# Patient Record
Sex: Male | Born: 1935
Health system: Southern US, Community
[De-identification: ages and names within clinical notes are randomized; demographics above are authoritative.]

## PROBLEM LIST (undated history)

## (undated) DIAGNOSIS — M1990 Unspecified osteoarthritis, unspecified site: Secondary | ICD-10-CM

## (undated) DIAGNOSIS — G609 Hereditary and idiopathic neuropathy, unspecified: Secondary | ICD-10-CM

## (undated) DIAGNOSIS — Z950 Presence of cardiac pacemaker: Secondary | ICD-10-CM

## (undated) DIAGNOSIS — K602 Anal fissure, unspecified: Secondary | ICD-10-CM

## (undated) DIAGNOSIS — D126 Benign neoplasm of colon, unspecified: Secondary | ICD-10-CM

## (undated) DIAGNOSIS — M549 Dorsalgia, unspecified: Secondary | ICD-10-CM

## (undated) DIAGNOSIS — K219 Gastro-esophageal reflux disease without esophagitis: Secondary | ICD-10-CM

## (undated) DIAGNOSIS — E663 Overweight: Secondary | ICD-10-CM

## (undated) DIAGNOSIS — G4730 Sleep apnea, unspecified: Secondary | ICD-10-CM

## (undated) DIAGNOSIS — D682 Hereditary deficiency of other clotting factors: Secondary | ICD-10-CM

## (undated) DIAGNOSIS — N138 Other obstructive and reflux uropathy: Secondary | ICD-10-CM

## (undated) DIAGNOSIS — K5730 Diverticulosis of large intestine without perforation or abscess without bleeding: Secondary | ICD-10-CM

## (undated) DIAGNOSIS — G4733 Obstructive sleep apnea (adult) (pediatric): Secondary | ICD-10-CM

## (undated) DIAGNOSIS — N400 Enlarged prostate without lower urinary tract symptoms: Secondary | ICD-10-CM

## (undated) DIAGNOSIS — K2270 Barrett's esophagus without dysplasia: Secondary | ICD-10-CM

## (undated) DIAGNOSIS — I82409 Acute embolism and thrombosis of unspecified deep veins of unspecified lower extremity: Secondary | ICD-10-CM

## (undated) DIAGNOSIS — N401 Enlarged prostate with lower urinary tract symptoms: Secondary | ICD-10-CM

## (undated) DIAGNOSIS — I10 Essential (primary) hypertension: Secondary | ICD-10-CM

## (undated) DIAGNOSIS — D649 Anemia, unspecified: Secondary | ICD-10-CM

## (undated) DIAGNOSIS — R55 Syncope and collapse: Secondary | ICD-10-CM

## (undated) DIAGNOSIS — R7309 Other abnormal glucose: Secondary | ICD-10-CM

## (undated) DIAGNOSIS — K589 Irritable bowel syndrome without diarrhea: Secondary | ICD-10-CM

## (undated) DIAGNOSIS — T7840XA Allergy, unspecified, initial encounter: Secondary | ICD-10-CM

## (undated) HISTORY — DX: Overweight: E66.3

## (undated) HISTORY — DX: Anemia, unspecified: D64.9

## (undated) HISTORY — PX: CATARACT EXTRACTION: SUR2

## (undated) HISTORY — DX: Essential (primary) hypertension: I10

## (undated) HISTORY — DX: Unspecified osteoarthritis, unspecified site: M19.90

## (undated) HISTORY — DX: Obstructive sleep apnea (adult) (pediatric): G47.33

## (undated) HISTORY — DX: Hereditary and idiopathic neuropathy, unspecified: G60.9

## (undated) HISTORY — PX: PACEMAKER PLACEMENT: SHX43

## (undated) HISTORY — DX: Dorsalgia, unspecified: M54.9

## (undated) HISTORY — DX: Other obstructive and reflux uropathy: N13.8

## (undated) HISTORY — DX: Benign prostatic hyperplasia without lower urinary tract symptoms: N40.0

## (undated) HISTORY — DX: Other abnormal glucose: R73.09

## (undated) HISTORY — DX: Gastro-esophageal reflux disease without esophagitis: K21.9

## (undated) HISTORY — PX: CHOLECYSTECTOMY: SHX55

## (undated) HISTORY — DX: Allergy, unspecified, initial encounter: T78.40XA

## (undated) HISTORY — DX: Anal fissure, unspecified: K60.2

## (undated) HISTORY — DX: Syncope and collapse: R55

## (undated) HISTORY — DX: Benign prostatic hyperplasia with lower urinary tract symptoms: N40.1

## (undated) HISTORY — DX: Benign neoplasm of colon, unspecified: D12.6

## (undated) HISTORY — DX: Hereditary deficiency of other clotting factors: D68.2

## (undated) HISTORY — DX: Acute embolism and thrombosis of unspecified deep veins of unspecified lower extremity: I82.409

## (undated) HISTORY — PX: APPENDECTOMY: SHX54

## (undated) HISTORY — DX: Barrett's esophagus without dysplasia: K22.70

## (undated) HISTORY — PX: TONSILLECTOMY: SUR1361

## (undated) HISTORY — DX: Diverticulosis of large intestine without perforation or abscess without bleeding: K57.30

## (undated) HISTORY — DX: Irritable bowel syndrome, unspecified: K58.9

## (undated) HISTORY — DX: Presence of cardiac pacemaker: Z95.0

## (undated) HISTORY — DX: Sleep apnea, unspecified: G47.30

---

## 1999-08-23 ENCOUNTER — Encounter (INDEPENDENT_AMBULATORY_CARE_PROVIDER_SITE_OTHER): Payer: Self-pay | Admitting: Specialist

## 1999-08-23 ENCOUNTER — Other Ambulatory Visit: Admission: RE | Admit: 1999-08-23 | Discharge: 1999-08-23 | Payer: Self-pay | Admitting: Gastroenterology

## 2004-08-29 ENCOUNTER — Ambulatory Visit: Payer: Self-pay

## 2004-09-03 ENCOUNTER — Ambulatory Visit: Payer: Self-pay | Admitting: Pulmonary Disease

## 2004-09-13 ENCOUNTER — Ambulatory Visit: Payer: Self-pay | Admitting: Pulmonary Disease

## 2004-10-18 ENCOUNTER — Ambulatory Visit: Payer: Self-pay | Admitting: Internal Medicine

## 2005-01-04 ENCOUNTER — Ambulatory Visit: Payer: Self-pay | Admitting: Internal Medicine

## 2005-03-05 ENCOUNTER — Ambulatory Visit: Payer: Self-pay | Admitting: Pulmonary Disease

## 2005-03-22 ENCOUNTER — Ambulatory Visit: Payer: Self-pay | Admitting: Internal Medicine

## 2005-05-23 ENCOUNTER — Ambulatory Visit: Payer: Self-pay | Admitting: Internal Medicine

## 2005-06-05 ENCOUNTER — Ambulatory Visit: Payer: Self-pay | Admitting: Pulmonary Disease

## 2005-07-25 ENCOUNTER — Ambulatory Visit: Payer: Self-pay | Admitting: Internal Medicine

## 2005-09-11 ENCOUNTER — Ambulatory Visit: Payer: Self-pay | Admitting: Internal Medicine

## 2005-11-07 ENCOUNTER — Ambulatory Visit: Payer: Self-pay | Admitting: Pulmonary Disease

## 2005-11-11 ENCOUNTER — Ambulatory Visit: Payer: Self-pay | Admitting: Internal Medicine

## 2006-01-10 ENCOUNTER — Ambulatory Visit: Payer: Self-pay | Admitting: Internal Medicine

## 2006-01-14 ENCOUNTER — Ambulatory Visit: Payer: Self-pay | Admitting: Pulmonary Disease

## 2006-05-06 ENCOUNTER — Ambulatory Visit: Payer: Self-pay | Admitting: Internal Medicine

## 2006-08-04 ENCOUNTER — Encounter: Admission: RE | Admit: 2006-08-04 | Discharge: 2006-08-04 | Payer: Self-pay | Admitting: Orthopedic Surgery

## 2006-08-13 ENCOUNTER — Ambulatory Visit: Payer: Self-pay | Admitting: Internal Medicine

## 2006-11-10 ENCOUNTER — Ambulatory Visit: Payer: Self-pay | Admitting: Pulmonary Disease

## 2006-11-12 ENCOUNTER — Ambulatory Visit: Payer: Self-pay | Admitting: Internal Medicine

## 2006-11-13 ENCOUNTER — Encounter: Payer: Self-pay | Admitting: Pulmonary Disease

## 2007-02-12 ENCOUNTER — Ambulatory Visit: Payer: Self-pay | Admitting: Internal Medicine

## 2007-05-07 ENCOUNTER — Ambulatory Visit: Payer: Self-pay | Admitting: Internal Medicine

## 2007-05-18 ENCOUNTER — Ambulatory Visit: Payer: Self-pay | Admitting: Internal Medicine

## 2007-06-15 ENCOUNTER — Ambulatory Visit: Payer: Self-pay | Admitting: Internal Medicine

## 2007-06-15 LAB — CONVERTED CEMR LAB
BUN: 10 mg/dL (ref 6–23)
CO2: 27 meq/L (ref 19–32)
Calcium: 9 mg/dL (ref 8.4–10.5)
Chloride: 99 meq/L (ref 96–112)
Creatinine, Ser: 1 mg/dL (ref 0.4–1.5)
Eosinophils Relative: 9.3 % — ABNORMAL HIGH (ref 0.0–5.0)
GFR calc Af Amer: 95 mL/min
Glucose, Bld: 104 mg/dL — ABNORMAL HIGH (ref 70–99)
HCT: 39 % (ref 39.0–52.0)
Hemoglobin: 13 g/dL (ref 13.0–17.0)
INR: 1.1 — ABNORMAL HIGH (ref 0.8–1.0)
Lymphocytes Relative: 26 % (ref 12.0–46.0)
Monocytes Absolute: 0.7 10*3/uL (ref 0.2–0.7)
Neutrophils Relative %: 53.4 % (ref 43.0–77.0)
RDW: 14.9 % — ABNORMAL HIGH (ref 11.5–14.6)
Sodium: 135 meq/L (ref 135–145)
WBC: 6.6 10*3/uL (ref 4.5–10.5)

## 2007-06-19 ENCOUNTER — Ambulatory Visit (HOSPITAL_COMMUNITY): Admission: RE | Admit: 2007-06-19 | Discharge: 2007-06-19 | Payer: Self-pay | Admitting: Internal Medicine

## 2007-06-19 ENCOUNTER — Ambulatory Visit: Payer: Self-pay | Admitting: Internal Medicine

## 2007-07-08 ENCOUNTER — Ambulatory Visit: Payer: Self-pay

## 2007-09-04 ENCOUNTER — Ambulatory Visit: Payer: Self-pay | Admitting: Internal Medicine

## 2007-10-30 DIAGNOSIS — M1990 Unspecified osteoarthritis, unspecified site: Secondary | ICD-10-CM | POA: Insufficient documentation

## 2007-10-30 DIAGNOSIS — G609 Hereditary and idiopathic neuropathy, unspecified: Secondary | ICD-10-CM | POA: Insufficient documentation

## 2007-10-30 DIAGNOSIS — K589 Irritable bowel syndrome without diarrhea: Secondary | ICD-10-CM | POA: Insufficient documentation

## 2007-11-02 ENCOUNTER — Ambulatory Visit: Payer: Self-pay | Admitting: Pulmonary Disease

## 2007-11-02 DIAGNOSIS — K5730 Diverticulosis of large intestine without perforation or abscess without bleeding: Secondary | ICD-10-CM | POA: Insufficient documentation

## 2007-11-02 DIAGNOSIS — J309 Allergic rhinitis, unspecified: Secondary | ICD-10-CM | POA: Insufficient documentation

## 2007-11-02 DIAGNOSIS — E663 Overweight: Secondary | ICD-10-CM | POA: Insufficient documentation

## 2007-11-02 DIAGNOSIS — N401 Enlarged prostate with lower urinary tract symptoms: Secondary | ICD-10-CM

## 2007-11-02 DIAGNOSIS — I10 Essential (primary) hypertension: Secondary | ICD-10-CM | POA: Insufficient documentation

## 2007-11-02 DIAGNOSIS — N138 Other obstructive and reflux uropathy: Secondary | ICD-10-CM | POA: Insufficient documentation

## 2007-11-02 DIAGNOSIS — R55 Syncope and collapse: Secondary | ICD-10-CM | POA: Insufficient documentation

## 2007-11-02 DIAGNOSIS — K219 Gastro-esophageal reflux disease without esophagitis: Secondary | ICD-10-CM | POA: Insufficient documentation

## 2007-12-08 ENCOUNTER — Telehealth (INDEPENDENT_AMBULATORY_CARE_PROVIDER_SITE_OTHER): Payer: Self-pay | Admitting: *Deleted

## 2008-05-31 ENCOUNTER — Ambulatory Visit: Payer: Self-pay | Admitting: Internal Medicine

## 2008-08-15 ENCOUNTER — Emergency Department (HOSPITAL_COMMUNITY): Admission: EM | Admit: 2008-08-15 | Discharge: 2008-08-16 | Payer: Self-pay | Admitting: Emergency Medicine

## 2008-11-03 ENCOUNTER — Ambulatory Visit: Payer: Self-pay | Admitting: Pulmonary Disease

## 2008-11-03 DIAGNOSIS — M549 Dorsalgia, unspecified: Secondary | ICD-10-CM | POA: Insufficient documentation

## 2008-11-04 ENCOUNTER — Encounter (INDEPENDENT_AMBULATORY_CARE_PROVIDER_SITE_OTHER): Payer: Self-pay

## 2008-11-21 ENCOUNTER — Telehealth (INDEPENDENT_AMBULATORY_CARE_PROVIDER_SITE_OTHER): Payer: Self-pay | Admitting: *Deleted

## 2008-11-21 DIAGNOSIS — G4733 Obstructive sleep apnea (adult) (pediatric): Secondary | ICD-10-CM | POA: Insufficient documentation

## 2008-11-22 ENCOUNTER — Ambulatory Visit: Payer: Self-pay | Admitting: Pulmonary Disease

## 2008-11-24 ENCOUNTER — Encounter: Payer: Self-pay | Admitting: Internal Medicine

## 2008-11-27 ENCOUNTER — Encounter: Payer: Self-pay | Admitting: Internal Medicine

## 2008-11-28 ENCOUNTER — Ambulatory Visit: Payer: Self-pay | Admitting: Internal Medicine

## 2008-12-06 ENCOUNTER — Encounter: Payer: Self-pay | Admitting: Internal Medicine

## 2008-12-06 ENCOUNTER — Ambulatory Visit: Payer: Self-pay | Admitting: Pulmonary Disease

## 2008-12-06 LAB — CONVERTED CEMR LAB
Fecal Occult Blood: NEGATIVE
OCCULT 1: NEGATIVE
OCCULT 2: NEGATIVE

## 2008-12-12 ENCOUNTER — Telehealth (INDEPENDENT_AMBULATORY_CARE_PROVIDER_SITE_OTHER): Payer: Self-pay | Admitting: *Deleted

## 2008-12-14 LAB — CONVERTED CEMR LAB
ALT: 22 units/L (ref 0–53)
AST: 26 units/L (ref 0–37)
Albumin: 3.8 g/dL (ref 3.5–5.2)
BUN: 14 mg/dL (ref 6–23)
Chloride: 101 meq/L (ref 96–112)
Cholesterol: 172 mg/dL (ref 0–200)
Eosinophils Relative: 15 % — ABNORMAL HIGH (ref 0.0–5.0)
GFR calc non Af Amer: 87.93 mL/min (ref >60)
Glucose, Bld: 113 mg/dL — ABNORMAL HIGH (ref 70–99)
Hgb A1c MFr Bld: 6.3 % (ref 4.6–6.5)
Leukocytes, UA: NEGATIVE
Lymphocytes Relative: 30.3 % (ref 12.0–46.0)
Monocytes Relative: 13.9 % — ABNORMAL HIGH (ref 3.0–12.0)
Neutrophils Relative %: 40.4 % — ABNORMAL LOW (ref 43.0–77.0)
Platelets: 252 10*3/uL (ref 150.0–400.0)
Potassium: 3.9 meq/L (ref 3.5–5.1)
Specific Gravity, Urine: 1.01 (ref 1.000–1.030)
TSH: 1.96 microintl units/mL (ref 0.35–5.50)
Total Protein: 7.3 g/dL (ref 6.0–8.3)
Urobilinogen, UA: 0.2 (ref 0.0–1.0)
VLDL: 18.8 mg/dL (ref 0.0–40.0)
WBC: 6.8 10*3/uL (ref 4.5–10.5)

## 2009-01-02 ENCOUNTER — Encounter: Payer: Self-pay | Admitting: Pulmonary Disease

## 2009-03-01 ENCOUNTER — Ambulatory Visit: Payer: Self-pay | Admitting: Internal Medicine

## 2009-03-13 ENCOUNTER — Encounter: Payer: Self-pay | Admitting: Internal Medicine

## 2009-06-01 ENCOUNTER — Encounter: Payer: Self-pay | Admitting: Pulmonary Disease

## 2009-06-27 ENCOUNTER — Encounter: Payer: Self-pay | Admitting: Pulmonary Disease

## 2009-07-04 ENCOUNTER — Ambulatory Visit: Payer: Self-pay | Admitting: Internal Medicine

## 2009-08-24 ENCOUNTER — Telehealth: Payer: Self-pay | Admitting: Pulmonary Disease

## 2009-09-21 ENCOUNTER — Telehealth: Payer: Self-pay | Admitting: Pulmonary Disease

## 2009-10-03 ENCOUNTER — Encounter: Payer: Self-pay | Admitting: Internal Medicine

## 2009-10-04 ENCOUNTER — Ambulatory Visit: Payer: Self-pay | Admitting: Internal Medicine

## 2009-10-10 ENCOUNTER — Encounter: Payer: Self-pay | Admitting: Internal Medicine

## 2009-11-02 ENCOUNTER — Ambulatory Visit: Payer: Self-pay | Admitting: Pulmonary Disease

## 2009-11-02 DIAGNOSIS — R7301 Impaired fasting glucose: Secondary | ICD-10-CM | POA: Insufficient documentation

## 2009-11-02 LAB — CONVERTED CEMR LAB
Albumin: 4.2 g/dL (ref 3.5–5.2)
Alkaline Phosphatase: 56 units/L (ref 39–117)
Basophils Relative: 0.9 % (ref 0.0–3.0)
Calcium: 9.1 mg/dL (ref 8.4–10.5)
Creatinine, Ser: 0.8 mg/dL (ref 0.4–1.5)
Eosinophils Relative: 9.5 % — ABNORMAL HIGH (ref 0.0–5.0)
GFR calc non Af Amer: 100.46 mL/min (ref >60)
Glucose, Bld: 109 mg/dL — ABNORMAL HIGH (ref 70–99)
HCT: 41.6 % (ref 39.0–52.0)
HDL: 50.3 mg/dL (ref >39.00)
Hemoglobin: 14.1 g/dL (ref 13.0–17.0)
Lymphocytes Relative: 34.2 % (ref 12.0–46.0)
Lymphs Abs: 2.3 10*3/uL (ref 0.7–4.0)
Monocytes Relative: 12.7 % — ABNORMAL HIGH (ref 3.0–12.0)
Neutro Abs: 2.8 10*3/uL (ref 1.4–7.7)
PSA: 2.26 ng/mL (ref 0.10–4.00)
RBC: 4.53 M/uL (ref 4.22–5.81)
RDW: 16 % — ABNORMAL HIGH (ref 11.5–14.6)
Sodium: 137 meq/L (ref 135–145)
Total CHOL/HDL Ratio: 4
Triglycerides: 106 mg/dL (ref 0.0–149.0)
VLDL: 21.2 mg/dL (ref 0.0–40.0)
WBC: 6.6 10*3/uL (ref 4.5–10.5)

## 2009-11-06 LAB — CONVERTED CEMR LAB: Vit D, 25-Hydroxy: 30 ng/mL (ref 30–89)

## 2009-11-10 ENCOUNTER — Telehealth (INDEPENDENT_AMBULATORY_CARE_PROVIDER_SITE_OTHER): Payer: Self-pay | Admitting: *Deleted

## 2009-11-13 ENCOUNTER — Ambulatory Visit: Payer: Self-pay | Admitting: Pulmonary Disease

## 2009-11-13 LAB — CONVERTED CEMR LAB
OCCULT 1: NEGATIVE
OCCULT 2: NEGATIVE
OCCULT 3: NEGATIVE
OCCULT 4: NEGATIVE

## 2009-11-22 ENCOUNTER — Encounter: Payer: Self-pay | Admitting: Pulmonary Disease

## 2009-11-23 ENCOUNTER — Ambulatory Visit: Payer: Self-pay | Admitting: Pulmonary Disease

## 2009-12-07 ENCOUNTER — Encounter: Payer: Self-pay | Admitting: Pulmonary Disease

## 2009-12-11 ENCOUNTER — Encounter: Payer: Self-pay | Admitting: Pulmonary Disease

## 2010-01-03 ENCOUNTER — Ambulatory Visit: Payer: Self-pay | Admitting: Internal Medicine

## 2010-02-08 ENCOUNTER — Encounter: Payer: Self-pay | Admitting: Internal Medicine

## 2010-03-16 ENCOUNTER — Encounter: Payer: Self-pay | Admitting: Cardiovascular Disease

## 2010-03-16 ENCOUNTER — Ambulatory Visit: Payer: Self-pay | Admitting: Cardiovascular Disease

## 2010-03-22 ENCOUNTER — Encounter: Payer: Self-pay | Admitting: Adult Health

## 2010-03-22 ENCOUNTER — Ambulatory Visit: Payer: Self-pay | Admitting: Pulmonary Disease

## 2010-03-22 ENCOUNTER — Telehealth: Payer: Self-pay | Admitting: Cardiovascular Disease

## 2010-03-22 DIAGNOSIS — R42 Dizziness and giddiness: Secondary | ICD-10-CM | POA: Insufficient documentation

## 2010-03-23 LAB — CONVERTED CEMR LAB
Albumin: 4.3 g/dL (ref 3.5–5.2)
Alkaline Phosphatase: 55 units/L (ref 39–117)
BUN: 13 mg/dL (ref 6–23)
Basophils Absolute: 0 10*3/uL (ref 0.0–0.1)
Bilirubin, Direct: 0.1 mg/dL (ref 0.0–0.3)
CO2: 29 meq/L (ref 19–32)
Calcium: 9.1 mg/dL (ref 8.4–10.5)
Eosinophils Absolute: 0.5 10*3/uL (ref 0.0–0.7)
GFR calc non Af Amer: 85.41 mL/min (ref >60)
Glucose, Bld: 97 mg/dL (ref 70–99)
Hemoglobin: 13.4 g/dL (ref 13.0–17.0)
Hgb A1c MFr Bld: 6.3 % (ref 4.6–6.5)
Leukocytes, UA: NEGATIVE
Lymphocytes Relative: 26.6 % (ref 12.0–46.0)
Lymphs Abs: 2.1 10*3/uL (ref 0.7–4.0)
MCHC: 35 g/dL (ref 30.0–36.0)
Monocytes Relative: 10.5 % (ref 3.0–12.0)
Neutro Abs: 4.4 10*3/uL (ref 1.4–7.7)
Platelets: 276 10*3/uL (ref 150.0–400.0)
RDW: 15.2 % — ABNORMAL HIGH (ref 11.5–14.6)
Specific Gravity, Urine: <=1.005 (ref 1.000–1.030)
TSH: 0.92 microintl units/mL (ref 0.35–5.50)
Total Protein: 7.1 g/dL (ref 6.0–8.3)
Urobilinogen, UA: 0.2 (ref 0.0–1.0)
pH: 7 (ref 5.0–8.0)

## 2010-04-12 ENCOUNTER — Ambulatory Visit: Payer: Self-pay | Admitting: Internal Medicine

## 2010-04-24 ENCOUNTER — Telehealth (INDEPENDENT_AMBULATORY_CARE_PROVIDER_SITE_OTHER): Payer: Self-pay | Admitting: *Deleted

## 2010-05-09 ENCOUNTER — Encounter: Payer: Self-pay | Admitting: Internal Medicine

## 2010-05-22 ENCOUNTER — Encounter: Payer: Self-pay | Admitting: Pulmonary Disease

## 2010-05-28 ENCOUNTER — Encounter: Payer: Self-pay | Admitting: Pulmonary Disease

## 2010-07-10 ENCOUNTER — Ambulatory Visit: Payer: Self-pay | Admitting: Internal Medicine

## 2010-08-26 LAB — CONVERTED CEMR LAB
ALT: 23 units/L (ref 0–40)
AST: 27 units/L (ref 0–37)
Albumin: 3.5 g/dL (ref 3.5–5.2)
Alkaline Phosphatase: 55 units/L (ref 39–117)
BUN: 6 mg/dL (ref 6–23)
Basophils Absolute: 0.1 10*3/uL (ref 0.0–0.1)
Basophils Relative: 1.1 % — ABNORMAL HIGH (ref 0.0–1.0)
Bilirubin Urine: NEGATIVE
Bilirubin, Direct: 0.1 mg/dL (ref 0.0–0.3)
CO2: 30 meq/L (ref 19–32)
Calcium: 8.6 mg/dL (ref 8.4–10.5)
Chloride: 105 meq/L (ref 96–112)
Cholesterol: 144 mg/dL (ref 0–200)
Creatinine, Ser: 0.9 mg/dL (ref 0.4–1.5)
Eosinophils Absolute: 0.7 10*3/uL — ABNORMAL HIGH (ref 0.0–0.6)
Eosinophils Relative: 12.8 % — ABNORMAL HIGH (ref 0.0–5.0)
GFR calc Af Amer: 107 mL/min
GFR calc non Af Amer: 89 mL/min
Glucose, Bld: 124 mg/dL — ABNORMAL HIGH (ref 70–99)
HCT: 42.1 % (ref 39.0–52.0)
HDL: 43.9 mg/dL (ref >39.0)
Hemoglobin, Urine: NEGATIVE
Hemoglobin: 14.1 g/dL (ref 13.0–17.0)
Ketones, ur: NEGATIVE mg/dL
LDL Cholesterol: 89 mg/dL (ref 0–99)
Leukocytes, UA: NEGATIVE
Lymphocytes Relative: 33.5 % (ref 12.0–46.0)
MCHC: 33.6 g/dL (ref 30.0–36.0)
MCV: 91.1 fL (ref 78.0–100.0)
Monocytes Absolute: 0.6 10*3/uL (ref 0.2–0.7)
Monocytes Relative: 11.4 % — ABNORMAL HIGH (ref 3.0–11.0)
Neutro Abs: 2.2 10*3/uL (ref 1.4–7.7)
Neutrophils Relative %: 41.2 % — ABNORMAL LOW (ref 43.0–77.0)
Nitrite: NEGATIVE
PSA: 1.93 ng/mL (ref 0.10–4.00)
Platelets: 237 10*3/uL (ref 150–400)
Potassium: 3.8 meq/L (ref 3.5–5.1)
RBC: 4.62 M/uL (ref 4.22–5.81)
RDW: 13.6 % (ref 11.5–14.6)
Sodium: 140 meq/L (ref 135–145)
Specific Gravity, Urine: 1.02 (ref 1.000–1.03)
TSH: 1.03 microintl units/mL (ref 0.35–5.50)
Total Bilirubin: 0.6 mg/dL (ref 0.3–1.2)
Total CHOL/HDL Ratio: 3.3
Total Protein, Urine: NEGATIVE mg/dL
Total Protein: 6.7 g/dL (ref 6.0–8.3)
Triglycerides: 54 mg/dL (ref 0–149)
Urine Glucose: NEGATIVE mg/dL
Urobilinogen, UA: 0.2 (ref 0.0–1.0)
VLDL: 11 mg/dL (ref 0–40)
WBC: 5.4 10*3/uL (ref 4.5–10.5)
pH: 6 (ref 5.0–8.0)

## 2010-08-30 NOTE — Progress Notes (Signed)
Summary: c pap equipment  Phone Note Call from Patient   Caller: Patient Call For: Jais Demir Summary of Call: pt need to talk to dr Drayden Lukas about c pap equipment Initial call taken by: Rickard Patience,  August 24, 2009 10:41 AM  Follow-up for Phone Call        The patient says he is needing a new mask for his CPAP. [facility]. He has never seen any of our sleep doctors. Please advise.Michel Bickers [facility]  August 24, 2009 10:48 AM   order has been sent in for a new mask for cpap machine---pt has upcoming appt in april with SN Randell Loop CMA  August 25, 2009 11:25 AM      Appended Document: c pap equipment Requested sleep study and current cpap settings from [place]. Spoke with Ethelle Lyon with [facility] and she will provide pt with cpap mask and supplies while we are waiting for sleep study. Contacted pt and he is aware that [facility] will be contacting him to get him a new cpap mask and supplies.

## 2010-08-30 NOTE — Assessment & Plan Note (Signed)
Summary: consult for osa   Copy to:  Alroy Dust Primary Provider/Referring Provider:  Alroy Dust  CC:  Sleep Consult.  History of Present Illness: The pt is a 77y/o male who I have been asked to see for management of osa.  The pt was diagnosed with osa in 2008 at the [facility], with AHI of 9 vs 21/hr (hard to ascertain from sleep report).  He was titrated to a cpap pressure of 11cm, and he was also noted to have frequent limb movements.  He has been on cpap since that time, and did feel more rested, but did not see a major difference in how he felt overall.  He has been compliant with the cpap, but has not been able to lose weight.  He denies breakthru snoring or apneas.  He goes to bed at 11-12mn, and arises at 7-9am to start his day.  He does feel rested upon arising.  He uses a full face mask, and has kept up with mask changes.  His epworth score today is 6, and he denies any significant daytime sleepiness with periods of inactivity.  Current Medications (verified): 1)  Loratadine 10 Mg  Tabs (Loratadine) .... Take 1 Tablet By Mouth Once A Day 2)  Bayer Aspirin Ec Low Dose 81 Mg  Tbec (Aspirin) .... Take 1 Tablet By Mouth Once A Day 3)  Hydrochlorothiazide 25 Mg  Tabs (Hydrochlorothiazide) .... Take 1 Tablet By Mouth Once A Day] 4)  Fish Oil 500 Mg  Caps (Omega-3 Fatty Acids) .... Take 1 Tablet By Mouth Once A Day 5)  Omeprazole 20 Mg  Tbec (Omeprazole) .... Take 1 Tablet By Mouth Twice Daily 6)  Flomax 0.4 Mg  Cp24 (Tamsulosin Hcl) .... Take 1 Tablet By Mouth Once A Day 7)  Saw Palmetto 160 Mg  Caps (Saw Palmetto (Serenoa Repens)) .... Take 1 Tablet By Mouth Once A Day 8)  Calcium-Carb 600 + D 600-125 Mg-Unit  Tabs (Calcium-Vitamin D) .... Take 1 Tablet By Mouth Once A Day 9)  Cvs Lecithin 1200 Mg Caps (Lecithin) .... Take 1 Tablet By Mouth Once A Day 10)  Vitamin D3 1000 Unit Tabs (Cholecalciferol) .... Take 1 Tablet By Mouth Once A Day 11)  Mobic 7.5 Mg Tabs (Meloxicam) .... Take 1  Tab By Mouth Once Daily As Needed For Arthritis Pain... 12)  Prednisone ?mg .... Take As Directed  Allergies (verified): 1)  ! * Shellfish  Past History:  Past Medical History: Reviewed history from 11/02/2009 and no changes required.  ALLERGIC RHINITIS (ICD-477.9) OBSTRUCTIVE SLEEP APNEA (ICD-327.23) HYPERTENSION (ICD-401.9) Hx of SYNCOPE (ICD-780.2) CARDIAC PACEMAKER-MEDTRONIC ADAPT ADDR01 (ICD-V45.01) DIABETES MELLITUS, BORDERLINE (ICD-790.29) OVERWEIGHT (ICD-278.02) GASTROESOPHAGEAL REFLUX DISEASE (ICD-530.81) DIVERTICULOSIS OF COLON (ICD-562.10) IRRITABLE BOWEL SYNDROME (ICD-564.1) BENIGN PROSTATIC HYPERTROPHY, WITH OBSTRUCTION (ICD-600.01) OSTEOARTHRITIS (ICD-715.90) BACK PAIN (ICD-724.5) PERIPHERAL NEUROPATHY (ICD-356.9)  Past Surgical History: S/P appendectomy 1988 S/P cholecystectomy 1988 S/P tonsillectomy 1950 Pacemaker Implant-Medtronic Adapta ADDR01/2008  Social History: Married and lives with wife. Children & 3 grandchildren Never Smoked retired.  prev worked for [employer] and [employer]  Review of Systems  The patient denies shortness of breath with activity, shortness of breath at rest, productive cough, non-productive cough, coughing up blood, chest pain, irregular heartbeats, acid heartburn, indigestion, loss of appetite, weight change, abdominal pain, difficulty swallowing, sore throat, tooth/dental problems, headaches, nasal congestion/difficulty breathing through nose, sneezing, itching, ear ache, anxiety, depression, hand/feet swelling, joint stiffness or pain, rash, change in color of mucus, and fever.    Vital  Signs:  Patient profile:   77 year old male Height:      67 inches Weight:      221 pounds O2 Sat:      96 % on Room air Temp:     98.1 degrees F oral Pulse rate:   97 / minute BP sitting:   140 / 84  (right arm) Cuff size:   regular  Vitals Entered By: Arman Filter LPN (November 23, 2009 2:27 PM)  O2 Flow:  Room air CC: Sleep  Consult Comments Medications reviewed with patient Arman Filter LPN  November 23, 2009 2:27 PM    Physical Exam  General:  ow male in nad Eyes:  PERRLA and EOMI.   Nose:  patent without discharge Mouth:  mild elongation of soft palate and uvula Neck:  no jvd, tmg, LN Lungs:  clear to auscultation Heart:  rrr, no mrg Abdomen:  soft and nontender, bs+ Extremities:  no edema or cyanosis pulses intact distally Neurologic:  alert and oriented, moves all 4.   Impression & Recommendations:  Problem # 1:  OBSTRUCTIVE SLEEP APNEA (ICD-327.23) The pt has a h/o moderate osa, and is wearing cpap compliantly.  He feels he is more rested in the am's, denies EDS, and does not snore.  Although he does not feel a lot different during the day, the cpap is at least taking care of these other factors.  He is satisfied with the device because of this.  I have asked him to work aggressive on weight loss, and he will have a reasonable chance of getting rid of cpap is successful.  I have asked him to keep up with supplies and mask changes, and will see him back in one year.    Medications Added to Medication List This Visit: 1)  Prednisone ?mg  .... Take as directed  Other Orders: Consultation Level IV (40981)  Patient Instructions: 1)  continue with cpap.  let me know if issues 2)  work on weight loss 3)  followup with me in 12mos.

## 2010-08-30 NOTE — Medication Information (Signed)
Summary: NP CPAP Visit/[facility]  NP CPAP Visit/[facility]   Imported By: Sherian Rein 11/07/2009 11:39:19  _____________________________________________________________________  External Attachment:    Type:   Image     Comment:   External Document

## 2010-08-30 NOTE — Progress Notes (Signed)
Summary: pt wants Tony Cruz  Phone Note Call from Patient Call back at [facility] Phone [phone]   Caller: Patient Call For: nadel Summary of Call: want ot speak to you about cpap machine and records from [place] Initial call taken by: Eugene Gavia,  September 21, 2009 1:11 PM  Follow-up for Phone Call        Pt wanted to know if we had recvd. records from the [place] (sleep records). Advised pt that records have been recvd. and that they are on his desk. Pt has appt in the morning with [facility] and I will need to fax sleep study over there for ins to cover his mask. Pt aware sleep study to be faxed. Tony Cruz  September 21, 2009 4:45 PM

## 2010-08-30 NOTE — Procedures (Signed)
Summary: Upper GI Endoscopy/[facility]  Upper GI Endoscopy/[facility]   Imported By: Sherian Rein 01/15/2010 10:11:23  _____________________________________________________________________  External Attachment:    Type:   Image     Comment:   External Document

## 2010-08-30 NOTE — Cardiovascular Report (Signed)
Summary: Office Visit Remote   Office Visit Remote   Imported By: Roderic Ovens 05/10/2010 11:01:35  _____________________________________________________________________  External Attachment:    Type:   Image     Comment:   External Document

## 2010-08-30 NOTE — Letter (Signed)
Summary: [facility]   Imported By: Sherian Rein 06/09/2010 09:56:00  _____________________________________________________________________  External Attachment:    Type:   Image     Comment:   External Document

## 2010-08-30 NOTE — Consult Note (Signed)
Summary: [facility]   Imported By: Sherian Rein 12/06/2009 10:18:35  _____________________________________________________________________  External Attachment:    Type:   Image     Comment:   External Document

## 2010-08-30 NOTE — Progress Notes (Signed)
Summary: Supporting lab dx code  ---- Converted from flag ---- ---- 04/23/2010 1:11 PM, Rubye Oaks NP wrote: can use urinary frequency  thanx  tam  ---- 04/23/2010 11:49 AM, Leodis Liverpool wrote: Need supporting diagnosis code for Urine Culture performed on 03/22/10.[facility] did not accept 780.2 and 780.4.  Thanks Darl Pikes ------------------------------

## 2010-08-30 NOTE — Cardiovascular Report (Signed)
Summary: Office Visit   Office Visit   Imported By: Roderic Ovens 07/12/2010 11:46:21  _____________________________________________________________________  External Attachment:    Type:   Image     Comment:   External Document

## 2010-08-30 NOTE — Cardiovascular Report (Signed)
Summary: Office Visit Remote   Office Visit Remote   Imported By: Roderic Ovens 10/11/2009 11:32:24  _____________________________________________________________________  External Attachment:    Type:   Image     Comment:   External Document

## 2010-08-30 NOTE — Assessment & Plan Note (Signed)
Summary: 1 YEAR/CB   CC:  12 month ROV & review of mult medical problems....  History of Present Illness: 77 y/o WM here for a follow up visit... he has multiple medical problems as noted below...    ~  Apr10:  Edin looks well and he has had a busy year- he is followed here, at the [facility], and thru a number of specialty offices... as usual he brought a list of things that he said he wanted to talk about:  (we reviewed his list of concerns from last yr- see EMR note 11/02/07) 1 ~  Another vasovagal episode 08/15/08:  he last saw DrKlein 11/09 & doing OK- no changes made... he did some reading on the internet and wonders about a Rx for atropine- we discussed this & NO. 2 ~  LBP & Stiffness:  saw chiropractor briefly w/ adjustments but no relief... he hasn't taken any meds... we discussed Ortho eval w/ XRays- he requests DrCollins... 3 ~  Left thumb pain:  try MOBIC 7.5mg /d... 4 ~  Pt's concern for DM- check BS w/ A1c (113/ 6.3)... needs better diet & get weight down. 5 ~  Pt notes ?gas moving in Abd? discomfort- I note waist 43" and pants size 38... discussed diet + exercise, loosen belt, and antigas meds...   ~  November 02, 2009:  he continues w/ mult somatic complaints & has had follow up evals from his various specialty physicians (see below)... today he requests referral to DrClance for Sleep Med and DrAnderson for Rheum ("arthritis doctor")...  **we discussed diet + exercise sufficient to aide weight loss;  he requests 90d refill of meds & would like copy of labs mailed to him...    Current Problems:   OBSTRUCTIVE SLEEP APNEA (ICD-327.23) - evaluation, diagnosis, and Rx by the [facility] (their notes scanned into the EMR):  he had PSG 4/08 w/ AHI= 26, desat to 83%... placed on CPAP at 11cm H20 pressure... he also has some RLS & was on Requip transiently from the [place].  ~  4/11: pt indicates that he uses his CPAP 4/7 nights for 6-7 hr/night; he feels that he rests well, denies daytime hypersom, snoring,  etc... he requests Sleep consult here in [place].  ALLERGIC RHINITIS (ICD-477.9) - controlled on Claritin, and OTC meds...  HYPERTENSION (ICD-401.9) - on ASA 81mg /d & HCTZ 25mg /d... BP=138/82 today and he denies HA, fatigue, visual changes, CP, palipit, dizziness, syncope, dyspnea, edema, etc...  ~  CXR 4/11 is clear and WNL, NAD...  Hx of SYNCOPE (ICD-780.2) & CARDIAC PACEMAKER IN SITU (ICD-V45.01) - he had pacer changed 11/08 DrKlein (hosp DC Summary reviewed)... he continues regular pacer checks and f/u w/ DrKlein...   ~  cath 3/99 DrJoeLeB showed norm coronaries, norm LV, norm Ao...  ~  last saw DrKlein 12/10: no recurrent syncope, pacer function normally.  DIABETES MELLITUS, BORDERLINE (ICD-790.29) - he's been concerned about his BS due to [facility] and his neuropathy symptoms, but his BS has only been borderline elvated prev...   ~  labs 4/10 (wt=209#) showed BS= 113, A1c= 6.3.Marland Kitchen. rec> better diet, get wt down.  ~  labs 4/11 (wt=222#) showed BS= 109, A1c= 6.2.Marland Kitchen. no meds yet, just needs to get wt down.  OVERWEIGHT (ICD-278.02) - we discussed diet + exercise therapy (again)...  ~  weight 4/09 = 226#.Marland KitchenMarland Kitchen 5" 9" Tall for a BMI= 33-34  ~  weight 4/10 = 209#... BMI down to 31... keep up the good work.  ~  weight  4/11 = 222# .Marland KitchenMarland Kitchen BMI up to 33.  GASTROESOPHAGEAL REFLUX DISEASE (ICD-530.81) w/ BARRETT'S ESOPH - followed by DrWeissman & Rx w/ OMEPRAZOLE 20mg Bid...  ~  prev EGD's documented a 5cmHH & Barrett's esoph... (last 1/03 by DrPatterson).  ~  last EGD was 2/08 showing Barrett's epithelium in lower 1/3 of esoph... f/u planned 77yrs.  ~  4/11:  NOTE> f/u EGD is due, he will decide on gastroenterologist.  DIVERTICULOSIS OF COLON (ICD-562.10) -   ~  last colonoscopy 2/08 by DrWeissman showed left sided divertics, otherw neg... f/u planned 77yrs...  IRRITABLE BOWEL SYNDROME (ICD-564.1)  BENIGN PROSTATIC HYPERTROPHY, WITH OBSTRUCTION (ICD-600.01) - on FLOMAX 0.4mg /d & Saw Palmetto... eval and Rx by  [facility] who checks pt q12mo (we don't have notes from him)... pt tells me he will be seeing DrDavis regularly when he moves to [facility].  OSTEOARTHRITIS (ICD-715.90) BACK PAIN (ICD-724.5) - pt saw DrBeane 6/10 for eval> neuropathy symptoms & lumbar spondylosis on XRays, Rx MOBIC 7.5mg  Prn... offered Myelogram for further eval... he also takes Calcium & Vit D.  PERIPHERAL NEUROPATHY (ICD-356.9) - DrReynolds evaluated him w/ NCV's etc and dx a peripheral neuropathy... this is one of his CC> numbness, decr sensation in feet, (no pain) w/ some assoc balance problems intermittently...  he tried Mentanx but no benefit...   ~  4/11: I reviewed DrReynolds 11/10 note w/ the patient...  Health Maintenance - he takes a number of Vits + lecithin, saw palmetto, Fish Oil, etc... he saw DrDJones in 2010 for Derm review- pt reports nothing major found...   Allergies: 1)  ! * Shellfish  Comments:  Nurse/Medical Assistant: The patient's medications and allergies were reviewed with the patient and were updated in the Medication and Allergy Lists.  Past History:  Past Medical History:  ALLERGIC RHINITIS (ICD-477.9) OBSTRUCTIVE SLEEP APNEA (ICD-327.23) HYPERTENSION (ICD-401.9) Hx of SYNCOPE (ICD-780.2) CARDIAC PACEMAKER-MEDTRONIC ADAPT ADDR01 (ICD-V45.01) DIABETES MELLITUS, BORDERLINE (ICD-790.29) OVERWEIGHT (ICD-278.02) GASTROESOPHAGEAL REFLUX DISEASE (ICD-530.81) DIVERTICULOSIS OF COLON (ICD-562.10) IRRITABLE BOWEL SYNDROME (ICD-564.1) BENIGN PROSTATIC HYPERTROPHY, WITH OBSTRUCTION (ICD-600.01) OSTEOARTHRITIS (ICD-715.90) BACK PAIN (ICD-724.5) PERIPHERAL NEUROPATHY (ICD-356.9)  Past Surgical History: S/P appendectomy S/P cholecystectomy Pacemaker Implant-Medtronic Adapta ADDR01/2008  Family History: Reviewed history from 11/03/2008 and no changes required. Father died age 43 w/ bladder cancer, CAD- MI, DM... Mother died age 59 w/ CHF... 2 Siblings- both sisters DM, overweight...  Social  History: Reviewed history from 11/03/2008 and no changes required. Married Children & 3 grandchildren Never Smoked  Review of Systems       The patient complains of sleep disorder, dyspnea on exertion, gas/bloating, joint pain, stiffness, arthritis, paresthesias, and difficulty walking.  The patient denies fever, chills, sweats, anorexia, fatigue, weakness, malaise, weight loss, blurring, diplopia, eye irritation, eye discharge, vision loss, eye pain, photophobia, earache, ear discharge, tinnitus, decreased hearing, nasal congestion, nosebleeds, sore throat, hoarseness, chest pain, palpitations, syncope, orthopnea, PND, peripheral edema, cough, dyspnea at rest, excessive sputum, hemoptysis, wheezing, pleurisy, nausea, vomiting, diarrhea, constipation, change in bowel habits, abdominal pain, melena, hematochezia, jaundice, indigestion/heartburn, dysphagia, odynophagia, dysuria, hematuria, urinary frequency, urinary hesitancy, nocturia, incontinence, back pain, joint swelling, muscle cramps, muscle weakness, sciatica, restless legs, leg pain at night, leg pain with exertion, rash, itching, dryness, suspicious lesions, paralysis, seizures, tremors, vertigo, transient blindness, frequent falls, frequent headaches, depression, anxiety, memory loss, confusion, cold intolerance, heat intolerance, polydipsia, polyphagia, polyuria, unusual weight change, abnormal bruising, bleeding, enlarged lymph nodes, urticaria, allergic rash, hay fever, and recurrent infections.    Vital Signs:  Patient profile:   77  year old male Height:      69 inches Weight:      222.25 pounds BMI:     32.94 O2 Sat:      98 % on Room air Temp:     97.7 degrees F oral Pulse rate:   76 / minute BP sitting:   138 / 82  (left arm) Cuff size:   regular  Vitals Entered By: Randell Loop CMA (November 02, 2009 8:59 AM)  O2 Sat at Rest %:  98 O2 Flow:  Room air CC: 12 month ROV & review of mult medical problems... Is Patient Diabetic?  No Pain Assessment Patient in pain? no      Comments no changes in meds   Physical Exam  Additional Exam:  WD, WN, 77 y/o WM in NAD... GENERAL:  Alert & oriented; pleasant & cooperative... HEENT:  [place]/AT, EOM-wnl, PERRLA, EACs-clear, TMs-wnl, NOSE-clear, THROAT-clear & wnl. NECK:  Supple w/ fairROM; no JVD; normal carotid impulses w/o bruits; no thyromegaly or nodules palpated; no lymphadenopathy. CHEST:  Clear to P & A; without wheezes/ rales/ or rhonchi. HEART:  Regular Rhythm; without murmurs/ rubs/ or gallops; pacer in left shoulder area... ABDOMEN:  Soft & nontender; normal bowel sounds; no organomegaly or masses detected. EXT: without deformities, mild arthritic changes; no varicose veins/ +venous insuffic/ no edema. NEURO:  CN's intact;  norm gait;  no focal neuro deficits... strength seems OK, min decr sensation in LEs. DERM:  No lesions noted; no rash etc...    CXR  Procedure date:  11/02/2009  Findings:      CHEST - 2 VIEW Comparison: 08/31/2001   Findings: The pacer wires are stable.  The cardiac silhouette, mediastinal and hilar contours are within normal limits and unchanged.  The lungs are clear.  Stable mild eventration of the right hemidiaphragm.  The bony thorax is intact.   IMPRESSION: Normal chest.  No change since prior study.   Read By:  Cyndie Chime,  M.D.   MISC. Report  Procedure date:  11/02/2009  Findings:      Lipid Panel (LIPID)   Cholesterol               185 mg/dL                   0-454   Triglycerides             106.0 mg/dL                 0.9-811.9   HDL                       14.78 mg/dL                 >29.56   LDL Cholesterol      [H]  213 mg/dL                   0-86   BMP (METABOL)   Sodium                    137 mEq/L                   135-145   Potassium                 4.0 mEq/L                   3.5-5.1   Chloride  99 mEq/L                    96-112   Carbon Dioxide            31 mEq/L                     19-32   Glucose              [H]  109 mg/dL                   16-10   BUN                       14 mg/dL                    9-60   Creatinine                0.8 mg/dL                   4.5-4.0   Calcium                   9.1 mg/dL                   9.8-11.9   GFR                       100.46 mL/min               >60  Hepatic/Liver Function Panel (HEPATIC)   Total Bilirubin           0.7 mg/dL                   1.4-7.8   Direct Bilirubin          0.0 mg/dL                   2.9-5.6   Alkaline Phosphatase      56 U/L                      39-117   AST                       26 U/L                      0-37   ALT                       29 U/L                      0-53   Total Protein             7.4 g/dL                    2.1-3.0   Albumin                   4.2 g/dL                    8.6-5.7  Comments:      CBC Platelet w/Diff (CBCD)   White Cell Count          6.6 K/uL  4.5-10.5   Red Cell Count            4.53 Mil/uL                 4.22-5.81   Hemoglobin                14.1 g/dL                   16.1-09.6   Hematocrit                41.6 %                      39.0-52.0   MCV                       91.8 fl                     78.0-100.0   Platelet Count            265.0 K/uL                  150.0-400.0   Neutrophil %         [L]  42.7 %                      43.0-77.0   Lymphocyte %              34.2 %                      12.0-46.0   Monocyte %           [H]  12.7 %                      3.0-12.0   Eosinophils%         [H]  9.5 %                       0.0-5.0   Basophils %               0.9 %                       0.0-3.0   TSH (TSH)   FastTSH                   1.58 uIU/mL                 0.35-5.50   Hemoglobin A1C (A1C)   Hemoglobin A1C            6.2 %                       4.6-6.5   Prostate Specific Antigen (PSA)   PSA-Hyb                   2.26 ng/mL                  0.10-4.00   Impression & Recommendations:  Problem # 1:  OBSTRUCTIVE SLEEP APNEA  (ICD-327.23) Stable on CPAP 11 w/ nasal mask... symptoms seem controlled & he denies daytime hypersom symptoms etc... He requests Sleep Med consult w/ DrClance... Orders: Sleep Disorder Referral (Sleep Disorder)  Problem # 2:  HYPERTENSION (ICD-401.9) Controlled on HCTZ... I told him that his BP would be even  better on this med if he got his weight down... Rec>  same med, Diet- no salt, get weight down... His updated medication list for this problem includes:    Hydrochlorothiazide 25 Mg Tabs (Hydrochlorothiazide) .Marland Kitchen... Take 1 tablet by mouth once a day]  Orders: T-2 View CXR (71020TC) TLB-Lipid Panel (80061-LIPID) TLB-BMP (Basic Metabolic Panel-BMET) (80048-METABOL) TLB-Hepatic/Liver Function Pnl (80076-HEPATIC) TLB-CBC Platelet - w/Differential (85025-CBCD) TLB-TSH (Thyroid Stimulating Hormone) (84443-TSH) TLB-A1C / Hgb A1C (Glycohemoglobin) (83036-A1C) T-Vitamin D (25-Hydroxy) (16109-60454) TLB-PSA (Prostate Specific Antigen) (84153-PSA)  Problem # 3:  CARDIAC PACEMAKER-MEDTRONIC ADAPT ADDR01 (ICD-V45.01) Followed by DrKlein & stable...  Problem # 4:  DIABETES MELLITUS, BORDERLINE (ICD-790.29) He is a borderline DM w/ BS 109-113 over the last yr and A1c= 6.2-6.3.Marland KitchenMarland Kitchen discussed w/ pt> needs better low carb diet, get weight down... no meds yet...  Problem # 5:  GASTROESOPHAGEAL REFLUX DISEASE (ICD-530.81) Due for f/u EGD due to Barrett's esoph... he will decide on Gastroenterologist & call. His updated medication list for this problem includes:    Omeprazole 20 Mg Tbec (Omeprazole) .Marland Kitchen... Take 1 tablet by mouth twice daily  Problem # 6:  BENIGN PROSTATIC HYPERTROPHY, WITH OBSTRUCTION (ICD-600.01) PSA is OK, he will be following DrDavis to [facility] for his Urologic needs... His updated medication list for this problem includes:    Flomax 0.4 Mg Cp24 (Tamsulosin hcl) .Marland Kitchen... Take 1 tablet by mouth once a day  Problem # 7:  OSTEOARTHRITIS (ICD-715.90) He wants a referral to [facility] to  discuss some of his arthritic concerns (wife sees him)... His updated medication list for this problem includes:    Bayer Aspirin Ec Low Dose 81 Mg Tbec (Aspirin) .Marland Kitchen... Take 1 tablet by mouth once a day    Mobic 7.5 Mg Tabs (Meloxicam) .Marland Kitchen... Take 1 tab by mouth once daily as needed for arthritis pain...  Orders: Rheumatology Referral (Rheumatology)  Problem # 8:  PERIPHERAL NEUROPATHY (ICD-356.9) Followed by DrReynolds w/ a cryptogenic polyneuropathy...   Problem # 9:  OTHER MEDICAL PROBLEMS AS NOTED>>>  Complete Medication List: 1)  Loratadine 10 Mg Tabs (Loratadine) .... Take 1 tablet by mouth once a day 2)  Bayer Aspirin Ec Low Dose 81 Mg Tbec (Aspirin) .... Take 1 tablet by mouth once a day 3)  Hydrochlorothiazide 25 Mg Tabs (Hydrochlorothiazide) .... Take 1 tablet by mouth once a day] 4)  Fish Oil 500 Mg Caps (Omega-3 fatty acids) .... Take 1 tablet by mouth once a day 5)  Omeprazole 20 Mg Tbec (Omeprazole) .... Take 1 tablet by mouth twice daily 6)  Flomax 0.4 Mg Cp24 (Tamsulosin hcl) .... Take 1 tablet by mouth once a day 7)  Saw Palmetto 160 Mg Caps (Saw palmetto (serenoa repens)) .... Take 1 tablet by mouth once a day 8)  Calcium-carb 600 + D 600-125 Mg-unit Tabs (Calcium-vitamin d) .... Take 1 tablet by mouth once a day 9)  Cvs Lecithin 1200 Mg Caps (Lecithin) .... Take 1 tablet by mouth once a day 10)  Vitamin D3 1000 Unit Tabs (Cholecalciferol) .... Take 1 tablet by mouth once a day 11)  Mobic 7.5 Mg Tabs (Meloxicam) .... Take 1 tab by mouth once daily as needed for arthritis pain...  Other Orders: Prescription Created Electronically [phone])  Patient Instructions: 1)  Today we updated your med list- see below.... 2)  We refilled your meds for 2011... 3)  Today we did your follow up CXR & FASTING blood work... please call the "phone tree" in a few days for your lab results.Marland KitchenMarland Kitchen  we will also send you a copy of your labs. 4)  Be sure to collect the stool cards at your  convenience & mail them to [place] so that we can check for hidden blood (we will only call you if they are abnormal). 5)  We will arrange for a Rheumatology consult regarding your arthritis w/ DrAnderson... 6)  We will arrange for a Sleep Med consult for your OSA w/ DrClance... 7)  Let's get on track w/ out diet + exercise program>> the goal is to lose the first 20-25lbs this yr... 8)  Call for any problems.Marland KitchenMarland Kitchen 9)  Please schedule a follow-up appointment in 1 year, sooner as needed... Prescriptions: MOBIC 7.5 MG TABS (MELOXICAM) take 1 tab by mouth once daily as needed for arthritis pain...  #90 x prn   Entered and Authorized by:   Michele Mcalpine MD   Signed by:   Michele Mcalpine MD on 11/02/2009   Method used:   Print then Give to Patient   RxID:   [id number] FLOMAX 0.4 MG  CP24 (TAMSULOSIN HCL) Take 1 tablet by mouth once a day  #90 x prn   Entered and Authorized by:   Michele Mcalpine MD   Signed by:   Michele Mcalpine MD on 11/02/2009   Method used:   Print then Give to Patient   RxID:   [id number] OMEPRAZOLE 20 MG  TBEC (OMEPRAZOLE) Take 1 tablet by mouth twice daily  #180 x prn   Entered and Authorized by:   Michele Mcalpine MD   Signed by:   Michele Mcalpine MD on 11/02/2009   Method used:   Print then Give to Patient   RxID:   [id number] HYDROCHLOROTHIAZIDE 25 MG  TABS (HYDROCHLOROTHIAZIDE) Take 1 tablet by mouth once a day]  #90 x prn   Entered and Authorized by:   Michele Mcalpine MD   Signed by:   Michele Mcalpine MD on 11/02/2009   Method used:   Print then Give to Patient   RxID:   [id number]    Immunization History:  Influenza Immunization History:    Influenza:  historical (08/16/2009)

## 2010-08-30 NOTE — Letter (Signed)
Summary: Remote Device Check  [employer], Main Office  [address]   [place], [place] [address]   Phone: [phone]  Fax: [phone]     February 08, 2010 MRN: [id number]   Tony Cruz [address] [place], [place]  [address]   Dear Mr. Clagett,   Your remote transmission was recieved and reviewed by your physician.  All diagnostics were within normal limits for you.  __X___Your next transmission is scheduled for:  04-12-2010.  Please transmit at any time this day.  If you have a wireless device your transmission will be sent automatically.   Sincerely,  Vella Kohler

## 2010-08-30 NOTE — Progress Notes (Signed)
Summary: tallk to nurse   Phone Note Call from Patient Call back at Home Phone [phone]   Caller: Patient Summary of Call: pt feels like he needs to be seen. BP high, lightheadness. pt passed out in the ofc on 8/19 while wife having test.  Initial call taken by: Edman Circle,  March 22, 2010 12:41 PM  Follow-up for Phone Call        03/22/10--1310p pt calling stating passed out in our office while waiting for his wife who had ECHO done--now stating dizziness and "feeling off for about 2 weeks" continues and would like to see dr Clide Cliff today--pt sees dr Kriste Basque and has an appoint with tammy spears today at 2pm--advised to keep appoint with t spears today and if she thinks this condition is cardiac related we would be happy to make him an appoitment--pt agrees Follow-up by: Ledon Snare, RN,  March 22, 2010 1:15 PM     Appended Document: tallk to nurse PER PT WAS EVALUATED BY TAMMY PARRET TODAY  WILL FOLLOW NEW TX PLAN WILL CALL IF NO IMPROVEMENT./CY

## 2010-08-30 NOTE — Assessment & Plan Note (Signed)
Summary: Syncope  Medications Added PENNSAID 1.5 % SOLN (DICLOFENAC SODIUM) as directed        Visit Type:  Follow-up Referring Provider:  Alroy Dust Primary Provider:  Alroy Dust  CC:  Add on Patient passed out.  History of Present Illness: 77 year-old male with hx neurodepressor syncope and pacemaker placement. He was in the office with his wife, who was having an echo, and he had a near-syncopal episode after stadning in the room for a long time. He developed lightheadedness, diaphoresis, and nausea. He sat in the chair and became poorly responsive. He then improved with lying down but had a second episode after sitting back up. After a long period of observation he improved, ambulated around the clinic without complaints, and had a stable HR and BP. Pacer interrogation showed normal function.  Current Medications (verified): 1)  Loratadine 10 Mg  Tabs (Loratadine) .... Take 1 Tablet By Mouth Once A Day 2)  Bayer Aspirin Ec Low Dose 81 Mg  Tbec (Aspirin) .... Take 1 Tablet By Mouth Once A Day 3)  Hydrochlorothiazide 25 Mg  Tabs (Hydrochlorothiazide) .... Take 1 Tablet By Mouth Once A Day] 4)  Fish Oil 500 Mg  Caps (Omega-3 Fatty Acids) .... Take 1 Tablet By Mouth Once A Day 5)  Omeprazole 20 Mg  Tbec (Omeprazole) .... Take 1 Tablet By Mouth Twice Daily 6)  Flomax 0.4 Mg  Cp24 (Tamsulosin Hcl) .... Take 1 Tablet By Mouth Once A Day 7)  Saw Palmetto 160 Mg  Caps (Saw Palmetto (Serenoa Repens)) .... Take 1 Tablet By Mouth Once A Day 8)  Cvs Lecithin 1200 Mg Caps (Lecithin) .... Take 1 Tablet By Mouth Once A Day 9)  Vitamin D3 1000 Unit Tabs (Cholecalciferol) .... Take 1 Tablet By Mouth Once A Day 10)  Pennsaid 1.5 % Soln (Diclofenac Sodium) .... As Directed  Allergies: 1)  ! * Shellfish  Past History:  Past Medical History: Last updated: 11/02/2009  ALLERGIC RHINITIS (ICD-477.9) OBSTRUCTIVE SLEEP APNEA (ICD-327.23) HYPERTENSION (ICD-401.9) Hx of SYNCOPE (ICD-780.2) CARDIAC  PACEMAKER-MEDTRONIC ADAPT ADDR01 (ICD-V45.01) DIABETES MELLITUS, BORDERLINE (ICD-790.29) OVERWEIGHT (ICD-278.02) GASTROESOPHAGEAL REFLUX DISEASE (ICD-530.81) DIVERTICULOSIS OF COLON (ICD-562.10) IRRITABLE BOWEL SYNDROME (ICD-564.1) BENIGN PROSTATIC HYPERTROPHY, WITH OBSTRUCTION (ICD-600.01) OSTEOARTHRITIS (ICD-715.90) BACK PAIN (ICD-724.5) PERIPHERAL NEUROPATHY (ICD-356.9)  Vital Signs:  Patient profile:   77 year old male Height:      67 inches Weight:      221 pounds BMI:     34.74 Pulse rate:   54 / minute Pulse rhythm:   regular BP sitting:   98 / 68  (left arm) Cuff size:   large  Vitals Entered By: Vikki Ports (March 16, 2010 9:49 AM)  Serial Vital Signs/Assessments:  Comments: pt had a vasovagal event while wife was getting an echo BP-84/50 BS-98 O2 Sat 95% BP 98/58  HR 57  pt palced on 2L O2 still diaphoretic, gave juice and cont to lay down, pt states feeling better will sit up and cont to monitor 9:30am   110/80  HR 59 9:45am   98/68    HR 50 pt diaphoretic again, laid back down and cont monitor, juice given 10:00am 120/80  HR 60 10:05am  132/88 HR 60 pt sat back up and he is doing ok, will cont to monitor 10:15am  136/90 HR 57 10:18am  132/90 HR 60 10:25am walked pt down hall did great no feelings of syncope again Dennis Bast, RN, BSN  March 16, 2010 3:50 PM By: Dennis Bast, RN,  BSN    Physical Exam  General:  Pt is alert and oriented, elderly male, in no acute distress. HEENT: normal Neck: JVP normal Lungs: CTA CV: bradycardic and regular without murmur or gallop Abd: soft, NT, positive BS, no bruit, no organomegaly Ext: no clubbing, cyanosis, or edema. peripheral pulses 2+ and equal Skin: clammy    PPM Specifications Following MD:  Sherryl Manges, MD     PPM Vendor:  Medtronic     PPM Model Number:  [id number]     PPM Serial Number:  [id number] PPM DOI:  06/19/2007     PPM Implanting MD:  Sherryl Manges, MD  Lead 1    Location: [facility]     DOI:  10/25/1997     Model #: [id number]     Serial #: [id number]     Status: active Lead 2    Location: [facility]     DOI: 10/25/1997     Model #: [id number]     Serial #: [id number]     Status: active   Indications:  Sinus Node Dysfunction   PPM Follow Up Pacer Dependent:  No      Episodes Coumadin:  No  Parameters Mode:  MVP (R)     Lower Rate Limit:  50     Upper Rate Limit:  130 Paced AV Delay:  150     Sensed AV Delay:  120  Impression & Recommendations:  Problem # 1:  Hx of SYNCOPE (ICD-780.2)  Typical episode of neurodepressor syncope after prolonged standing. Event was witnessed and heart rhythm was stable. Pt improved as expected over the course of a few hours and didn't require IV fluids or other intervention. He was advised to push fluids, liberalize salt, and avoid prolonged standing or hot temperatures.  His updated medication list for this problem includes:    Bayer Aspirin Ec Low Dose 81 Mg Tbec (Aspirin) .Marland Kitchen... Take 1 tablet by mouth once a day  Orders: EKG w/ Interpretation (93000)

## 2010-08-30 NOTE — Cardiovascular Report (Signed)
Summary: Office Visit Remote   Office Visit Remote   Imported By: Roderic Ovens 02/12/2010 12:27:15  _____________________________________________________________________  External Attachment:    Type:   Image     Comment:   External Document

## 2010-08-30 NOTE — Letter (Signed)
Summary: [facility]   Imported By: Sherian Rein 12/28/2009 14:54:11  _____________________________________________________________________  External Attachment:    Type:   Image     Comment:   External Document

## 2010-08-30 NOTE — Progress Notes (Signed)
Summary: med lest  Phone Note Call from Patient Call back at [phone]   Caller: Dr. Daphine Deutscher [facility]'s Office Call For: Kriste Basque Summary of Call: need list of pt's most recent meds faxed to [phone] Attn:Katrina Initial call taken by: Eugene Gavia,  November 10, 2009 3:30 PM  Follow-up for Phone Call        Faxed list./Juanita Follow-up by: Darletta Moll,  November 13, 2009 1:15 PM

## 2010-08-30 NOTE — Letter (Signed)
Summary: Remote Device Check  [facility], Main Office  [address]   [place], [place] [address]   Phone: [phone]  Fax: [phone]     October 10, 2009 MRN: [id number]   Tony Cruz [address] [place], [place]  [address]   Dear [name],   Your remote transmission was recieved and reviewed by your physician.  All diagnostics were within normal limits for you.  __X___Your next transmission is scheduled for:  January 03, 2010.  Please transmit at any time this day.  If you have a wireless device your transmission will be sent automatically.     Sincerely,  Proofreader

## 2010-08-30 NOTE — Assessment & Plan Note (Signed)
Summary: Acute NP office visit - HTN, dizziness   Copy to:  Alroy Dust Primary Provider/Referring Provider:  Alroy Dust  CC:  passed out at cardiology.  History of Present Illness: 77 yo male    ~  Apr10:  Tony Cruz looks well and he has had a busy year- he is followed here, at the [facility], and thru a number of specialty offices... as usual he brought a list of things that he said he wanted to talk about:  (we reviewed his list of concerns from last yr- see EMR note 11/02/07) 1 ~  Another vasovagal episode 08/15/08:  he last saw DrKlein 11/09 & doing OK- no changes made... he did some reading on the internet and wonders about a Rx for atropine- we discussed this & NO. 2 ~  LBP & Stiffness:  saw chiropractor briefly w/ adjustments but no relief... he hasn't taken any meds... we discussed Ortho eval w/ XRays- he requests DrCollins... 3 ~  Left thumb pain:  try MOBIC 7.5mg /d... 4 ~  Pt's concern for DM- check BS w/ A1c (113/ 6.3)... needs better diet & get weight down. 5 ~  Pt notes ?gas moving in Abd? discomfort- I note waist 43" and pants size 38... discussed diet + exercise, loosen belt, and antigas meds...   ~  November 02, 2009:  he continues w/ mult somatic complaints & has had follow up evals from his various specialty physicians (see below)... today he requests referral to DrClance for Sleep Med and DrAnderson for Rheum ("arthritis doctor")...  **we discussed diet + exercise sufficient to aide weight loss;  he requests 90d refill of meds & would like copy of labs mailed to him...  March 22, 2010 --Presents for work in visit. Pt states he was seen by cardiology recently for passing out. He says he was holding wife leg for 30-23min standing up passed out at cards. His  HR increasing to 102,; BP increasing 157/94, normal 120s/80s;  was felt to be vasovagal. He says he feeling better, eating well with good fluid intake.  No futher episodes. We discussed alot of his meds. He takes all his meds, swallows a  handful of pills every am. Have suggested he split them in half , some in am and some in pm. Denies chest pain, dyspnea, orthopnea, hemoptysis, fever, n/v/d, edema, headache.   Medications Prior to Update: 1)  Loratadine 10 Mg  Tabs (Loratadine) .... Take 1 Tablet By Mouth Once A Day 2)  Bayer Aspirin Ec Low Dose 81 Mg  Tbec (Aspirin) .... Take 1 Tablet By Mouth Once A Day 3)  Hydrochlorothiazide 25 Mg  Tabs (Hydrochlorothiazide) .... Take 1 Tablet By Mouth Once A Day] 4)  Fish Oil 500 Mg  Caps (Omega-3 Fatty Acids) .... Take 1 Tablet By Mouth Once A Day 5)  Omeprazole 20 Mg  Tbec (Omeprazole) .... Take 1 Tablet By Mouth Twice Daily 6)  Flomax 0.4 Mg  Cp24 (Tamsulosin Hcl) .... Take 1 Tablet By Mouth Once A Day 7)  Saw Palmetto 160 Mg  Caps (Saw Palmetto (Serenoa Repens)) .... Take 1 Tablet By Mouth Once A Day 8)  Cvs Lecithin 1200 Mg Caps (Lecithin) .... Take 1 Tablet By Mouth Once A Day 9)  Vitamin D3 1000 Unit Tabs (Cholecalciferol) .... Take 1 Tablet By Mouth Once A Day 10)  Pennsaid 1.5 % Soln (Diclofenac Sodium) .... As Directed  Current Medications (verified): 1)  Loratadine 10 Mg  Tabs (Loratadine) .... Take 1 Tablet By  Mouth Once A Day 2)  Bayer Aspirin Ec Low Dose 81 Mg  Tbec (Aspirin) .... Take 1 Tablet By Mouth Once A Day 3)  Hydrochlorothiazide 25 Mg  Tabs (Hydrochlorothiazide) .... Take 1 Tablet By Mouth Once A Day] 4)  Fish Oil 500 Mg  Caps (Omega-3 Fatty Acids) .... Take 1 Tablet By Mouth Once A Day 5)  Omeprazole 20 Mg  Tbec (Omeprazole) .... Take 1 Tablet By Mouth Twice Daily 6)  Flomax 0.4 Mg  Cp24 (Tamsulosin Hcl) .... Take 1 Tablet By Mouth Once A Day 7)  Saw Palmetto 160 Mg  Caps (Saw Palmetto (Serenoa Repens)) .... Take 1 Tablet By Mouth Once A Day 8)  Cvs Lecithin 1200 Mg Caps (Lecithin) .... Take 1 Tablet By Mouth Once A Day 9)  Vitamin D3 1000 Unit Tabs (Cholecalciferol) .... Take 1 Tablet By Mouth Once A Day 10)  Pennsaid 1.5 % Soln (Diclofenac Sodium) .... As  Directed 11)  Multivitamins   Tabs (Multiple Vitamin) .... Take 1 Tablet By Mouth Once A Day  Allergies (verified): 1)  ! * Shellfish  Past History:  Past Medical History: Last updated: 11/02/2009  ALLERGIC RHINITIS (ICD-477.9) OBSTRUCTIVE SLEEP APNEA (ICD-327.23) HYPERTENSION (ICD-401.9) Hx of SYNCOPE (ICD-780.2) CARDIAC PACEMAKER-MEDTRONIC ADAPT ADDR01 (ICD-V45.01) DIABETES MELLITUS, BORDERLINE (ICD-790.29) OVERWEIGHT (ICD-278.02) GASTROESOPHAGEAL REFLUX DISEASE (ICD-530.81) DIVERTICULOSIS OF COLON (ICD-562.10) IRRITABLE BOWEL SYNDROME (ICD-564.1) BENIGN PROSTATIC HYPERTROPHY, WITH OBSTRUCTION (ICD-600.01) OSTEOARTHRITIS (ICD-715.90) BACK PAIN (ICD-724.5) PERIPHERAL NEUROPATHY (ICD-356.9)  Past Surgical History: Last updated: 11/23/2009 S/P appendectomy 1988 S/P cholecystectomy 1988 S/P tonsillectomy 1950 Pacemaker Implant-Medtronic Adapta ADDR01/2008  Family History: Last updated: 11-26-2008 Father died age 41 w/ bladder cancer, CAD- MI, DM... Mother died age 67 w/ CHF... 2 Siblings- both sisters DM, overweight...  Social History: Last updated: 11/23/2009 Married and lives with wife. Children & 3 grandchildren Never Smoked retired.  prev worked for [employer] and [employer]  Risk Factors: Smoking Status: never (Nov 26, 2008)  Review of Systems      See HPI  Vital Signs:  Patient profile:   77 year old male Height:      67 inches Weight:      222.50 pounds BMI:     34.97 O2 Sat:      96 % on Room air Temp:     97.1 degrees F oral Pulse rate:   97 / minute BP sitting:   140 / 88  (right arm) Cuff size:   regular  Vitals Entered By: Boone Master CNA/MA (March 22, 2010 3:06 PM)  O2 Flow:  Room air CC: passed out at cardiology Is Patient Diabetic? No Comments Medications reviewed with patient Daytime contact number verified with patient. Boone Master CNA/MA  March 22, 2010 3:06 PM    Physical Exam  Additional Exam:  GEN: A/Ox3; pleasant ,  NAD HEENT:  [place]/AT, , EACs-clear, TMs-wnl, NOSE-clear, THROAT-clear NECK:  Supple w/ fair ROM; no JVD; normal carotid impulses w/o bruits; no thyromegaly or nodules palpated; no lymphadenopathy. RESP  Clear to P & A; w/o, wheezes/ rales/ or rhonchi. CARD:  RRR, no m/r/g   GI:   Soft & nt; nml bowel sounds; no organomegaly or masses detected. Musco: Warm bil,  no calf tenderness edema, clubbing, pulses intact Neuro : a/o x 3 , maew ,  CN 2-12 intact, no focal deficits noted, nml grips bilaterally.      Impression & Recommendations:  Problem # 1:  Hx of SYNCOPE (ICD-780.2)  recent episode of syncope with Card evaluation, suspect vasovagal response  will adjust meds times  cont w/ fluids .  labs pending.   Orders: T-Urine Culture (Spectrum Order) [id number]) TLB-BMP (Basic Metabolic Panel-BMET) (80048-METABOL) TLB-CBC Platelet - w/Differential (85025-CBCD) TLB-TSH (Thyroid Stimulating Hormone) (84443-TSH) TLB-Hepatic/Liver Function Pnl (80076-HEPATIC) TLB-Udip w/ Micro (81001-URINE) Est. Patient Level IV (09811)  Problem # 2:  DIZZINESS (ICD-780.4) exam unrevealing.  labs pending  meclizine as needed   Orders: T-Urine Culture (Spectrum Order) [id number]) TLB-BMP (Basic Metabolic Panel-BMET) (80048-METABOL) TLB-CBC Platelet - w/Differential (85025-CBCD) TLB-TSH (Thyroid Stimulating Hormone) (84443-TSH) TLB-Hepatic/Liver Function Pnl (80076-HEPATIC) TLB-Udip w/ Micro (81001-URINE) Est. Patient Level IV (13086)  Medications Added to Medication List This Visit: 1)  Multivitamins Tabs (Multiple vitamin) .... Take 1 tablet by mouth once a day 2)  Meclizine Hcl 25 Mg Tabs (Meclizine hcl) .... 1/2 to 1 by mouth three times a day as needed dizziness  Complete Medication List: 1)  Loratadine 10 Mg Tabs (Loratadine) .... Take 1 tablet by mouth once a day 2)  Bayer Aspirin Ec Low Dose 81 Mg Tbec (Aspirin) .... Take 1 tablet by mouth once a day 3)  Hydrochlorothiazide 25 Mg Tabs  (Hydrochlorothiazide) .... Take 1 tablet by mouth once a day] 4)  Fish Oil 500 Mg Caps (Omega-3 fatty acids) .... Take 1 tablet by mouth once a day 5)  Omeprazole 20 Mg Tbec (Omeprazole) .... Take 1 tablet by mouth twice daily 6)  Flomax 0.4 Mg Cp24 (Tamsulosin hcl) .... Take 1 tablet by mouth once a day 7)  Saw Palmetto 160 Mg Caps (Saw palmetto (serenoa repens)) .... Take 1 tablet by mouth once a day 8)  Cvs Lecithin 1200 Mg Caps (Lecithin) .... Take 1 tablet by mouth once a day 9)  Vitamin D3 1000 Unit Tabs (Cholecalciferol) .... Take 1 tablet by mouth once a day 10)  Pennsaid 1.5 % Soln (Diclofenac sodium) .... As directed 11)  Multivitamins Tabs (Multiple vitamin) .... Take 1 tablet by mouth once a day 12)  Meclizine Hcl 25 Mg Tabs (Meclizine hcl) .... 1/2 to 1 by mouth three times a day as needed dizziness  Other Orders: TLB-A1C / Hgb A1C (Glycohemoglobin) (83036-A1C)  Patient Instructions: 1)  Switch Saw Palmetto, multivitamin, Lecithin, Aspirin and  2)  to bedtime.  3)  I will call with lab results  4)  Meclizine 25mg  1/2 to 1 by mouth every 8hr as needed dizziness 5)  Change positions slowly, Increase fluids.  6)  follow up 1 month and as needed  7)  Please contact office for sooner follow up if symptoms do not improve or worsen  Prescriptions: MECLIZINE HCL 25 MG TABS (MECLIZINE HCL) 1/2 to 1 by mouth three times a day as needed dizziness  #30 x 0   Entered and Authorized by:   Rubye Oaks NP   Signed by:   Tammy Parrett NP on 03/22/2010   Method used:   Electronically to        [facility] (retail)       [address]       [place], [place]  [address]       Ph: [phone] or [phone]       Fax: [phone]   RxID:   [id number]

## 2010-08-30 NOTE — Letter (Signed)
Summary: [facility]  [facility]   Imported By: Sherian Rein 06/09/2010 10:49:50  _____________________________________________________________________  External Attachment:    Type:   Image     Comment:   External Document

## 2010-08-30 NOTE — Assessment & Plan Note (Signed)
Summary: device/saf   Visit Type:  MEDTRONIC PPM Referring Provider:  Alroy Dust Primary Provider:  Alroy Dust  CC:  no complaints.  History of Present Illness: Tony Cruz is seen in followup for syncope secondary to sinus node dysfunction for which a pacemaker was previously implanted and recently changed out.  He denies SOB, CP edema..   has had intercurrent syncope that was thought to be vasovagal  Problems Prior to Update: 1)  Dizziness  (ICD-780.4) 2)  Allergic Rhinitis  (ICD-477.9) 3)  Obstructive Sleep Apnea  (ICD-327.23) 4)  Hypertension  (ICD-401.9) 5)  Hx of Syncope  (ICD-780.2) 6)  Cardiac Pacemaker-medtronic Adapt Addr01  (ICD-V45.01) 7)  Diabetes Mellitus, Borderline  (ICD-790.29) 8)  Overweight  (ICD-278.02) 9)  Gastroesophageal Reflux Disease  (ICD-530.81) 10)  Diverticulosis of Colon  (ICD-562.10) 11)  Irritable Bowel Syndrome  (ICD-564.1) 12)  Benign Prostatic Hypertrophy, With Obstruction  (ICD-600.01) 13)  Osteoarthritis  (ICD-715.90) 14)  Back Pain  (ICD-724.5) 15)  Peripheral Neuropathy  (ICD-356.9)  Current Medications (verified): 1)  Loratadine 10 Mg  Tabs (Loratadine) .... Take 1 Tablet By Mouth Once A Day 2)  Bayer Aspirin Ec Low Dose 81 Mg  Tbec (Aspirin) .... Take 1 Tablet By Mouth Once A Day 3)  Hydrochlorothiazide 25 Mg  Tabs (Hydrochlorothiazide) .... Take 1 Tablet By Mouth Once A Day] 4)  Fish Oil 500 Mg  Caps (Omega-3 Fatty Acids) .... Take 1 Tablet By Mouth Once A Day 5)  Omeprazole 20 Mg  Tbec (Omeprazole) .... Take 1 Tablet By Mouth Twice Daily 6)  Flomax 0.4 Mg  Cp24 (Tamsulosin Hcl) .... Take 1 Tablet By Mouth Once A Day 7)  Saw Palmetto 160 Mg  Caps (Saw Palmetto (Serenoa Repens)) .... Take 1 Tablet By Mouth Once A Day 8)  Cvs Lecithin 1200 Mg Caps (Lecithin) .... Take 1 Tablet By Mouth Once A Day 9)  Vitamin D3 1000 Unit Tabs (Cholecalciferol) .... Take 1 Tablet By Mouth Once A Day 10)  Multivitamins   Tabs (Multiple Vitamin) .... Take  1 Tablet By Mouth Once A Day 11)  Fenoprofen Calcium 600 Mg Tabs (Fenoprofen Calcium) .... Once Daily 12)  Ultram 50 Mg Tabs (Tramadol Hcl) .... Two Times A Day  Allergies (verified): 1)  ! * Shellfish  Past History:  Past Medical History: Last updated: 11/02/2009  ALLERGIC RHINITIS (ICD-477.9) OBSTRUCTIVE SLEEP APNEA (ICD-327.23) HYPERTENSION (ICD-401.9) Hx of SYNCOPE (ICD-780.2) CARDIAC PACEMAKER-MEDTRONIC ADAPT ADDR01 (ICD-V45.01) DIABETES MELLITUS, BORDERLINE (ICD-790.29) OVERWEIGHT (ICD-278.02) GASTROESOPHAGEAL REFLUX DISEASE (ICD-530.81) DIVERTICULOSIS OF COLON (ICD-562.10) IRRITABLE BOWEL SYNDROME (ICD-564.1) BENIGN PROSTATIC HYPERTROPHY, WITH OBSTRUCTION (ICD-600.01) OSTEOARTHRITIS (ICD-715.90) BACK PAIN (ICD-724.5) PERIPHERAL NEUROPATHY (ICD-356.9)  Past Surgical History: Last updated: 11/23/2009 S/P appendectomy 1988 S/P cholecystectomy 1988 S/P tonsillectomy 1950 Pacemaker Implant-Medtronic Adapta ADDR01/2008  Family History: Last updated: 2008/11/07 Father died age 34 w/ bladder cancer, CAD- MI, DM... Mother died age 104 w/ CHF... 2 Siblings- both sisters DM, overweight...  Social History: Last updated: 11/23/2009 Married and lives with wife. Children & 3 grandchildren Never Smoked retired.  prev worked for [employer] and [employer]  Risk Factors: Smoking Status: never (2008-11-07)  Vital Signs:  Patient profile:   77 year old male Height:      67 inches Weight:      223.25 pounds BMI:     35.09 Pulse rate:   72 / minute BP sitting:   134 / 85  (left arm) Cuff size:   regular  Vitals Entered By: Caralee Ates CMA (July 10, 2010 10:39 AM)  Physical Exam  General:  The patient was alert and oriented in no acute distress. HEENT Normal.  Neck veins were flat, carotids were brisk.  Lungs were clear.  Heart sounds were regular without murmurs or gallops.  Abdomen was soft with active bowel sounds. There is no clubbing cyanosis or edema. Skin  Warm and dry    PPM Specifications Following MD:  Sherryl Manges, MD     PPM Vendor:  Medtronic     PPM Model Number:  ADDR01     PPM Serial Number:  [id number] PPM DOI:  06/19/2007     PPM Implanting MD:  Sherryl Manges, MD  Lead 1    Location: RA     DOI: 10/25/1997     Model #: [id number]     Serial #: [id number]     Status: active Lead 2    Location: RV     DOI: 10/25/1997     Model #: [id number]     Serial #: [id number]     Status: active   Indications:  Sinus Node Dysfunction   PPM Follow Up Battery Voltage:  2.77 V     Battery Est. Longevity:  6.5 yrs     Pacer Dependent:  No       PPM Device Measurements Atrium  Amplitude: 2.80 mV, Impedance: 656 ohms, Threshold: 0.250 V at 0.40 msec Right Ventricle  Amplitude: 15.68 mV, Impedance: 756 ohms, Threshold: 0.750 V at 0.40 msec  Episodes MS Episodes:  0     Coumadin:  No Ventricular High Rate:  0     Atrial Pacing:  4.5%     Ventricular Pacing:  0.4%  Parameters Mode:  MVP (R)     Lower Rate Limit:  50     Upper Rate Limit:  130 Paced AV Delay:  150     Sensed AV Delay:  120 Next Remote Date:  10/11/2010     Next Cardiology Appt Due:  07/01/2011 Tech Comments:  NORMAL DEVICE FUNCTION. NO EPISODES SINCE LAST CHECK. CHANGED RA OUTPUT FROM 1.5 TO 2.0 AND RV OUTPUT FROM 2.0 TO 2.5 V. PER SK RATE DROP TURNED ON DUE TO SYNCOPAL EPISODES.  CARELINK 10-11-10 AND ROV IN 12 MTHS W/SK. Vella Kohler  July 10, 2010 10:41 AM  Impression & Recommendations:  Problem # 1:  CARDIAC PACEMAKER-MEDTRONIC ADAPT ADDR01 (ICD-V45.01) Device parameters and data were reviewed and changes were made as outlined below  Problem # 2:   SYNCOPE (ICD-780.2)  the patient had recurrent vasovagal syncope. I have reprogrammed the device to activate rate drop. To do this I had to turn off MVP His updated medication list for this problem includes:    Bayer Aspirin Ec Low Dose 81 Mg Tbec (Aspirin) .Marland Kitchen... Take 1 tablet by mouth once a day  Problem # 3:  HYPERTENSION  (ICD-401.9)  stable on the current meds His updated medication list for this problem includes:    Bayer Aspirin Ec Low Dose 81 Mg Tbec (Aspirin) .Marland Kitchen... Take 1 tablet by mouth once a day    Hydrochlorothiazide 25 Mg Tabs (Hydrochlorothiazide) .Marland Kitchen... Take 1 tablet by mouth once a day]  His updated medication list for this problem includes:    Bayer Aspirin Ec Low Dose 81 Mg Tbec (Aspirin) .Marland Kitchen... Take 1 tablet by mouth once a day    Hydrochlorothiazide 25 Mg Tabs (Hydrochlorothiazide) .Marland Kitchen... Take 1 tablet by mouth once a day]  Patient Instructions: 1)  Your physician recommends that you  continue on your current medications as directed. Please refer to the Current Medication list given to you today. 2)  Your physician wants you to follow-up in: 1 year  You will receive a reminder letter in the mail two months in advance. If you don't receive a letter, please call our office to schedule the follow-up appointment.

## 2010-08-30 NOTE — Letter (Signed)
Summary: Remote Device Check  [facility], Main Office  [address]   [place], [place] [address]   Phone: [phone]  Fax: [phone]     May 09, 2010 MRN: [id number]   MYKALE GANDOLFO [address] [place], [place]  [address]   Dear Mr. Thorns,   Your remote transmission was recieved and reviewed by your physician.  All diagnostics were within normal limits for you.  __X____Your next office visit is scheduled for:    07-10-2010 @ 1020 with Dr Graciela Husbands.   Sincerely,  Vella Kohler

## 2010-09-19 ENCOUNTER — Encounter: Payer: Self-pay | Admitting: Pulmonary Disease

## 2010-09-25 NOTE — Letter (Signed)
Summary: CMN for [facility]  CMN for [facility]   Imported By: Sherian Rein 09/19/2010 12:08:34  _____________________________________________________________________  External Attachment:    Type:   Image     Comment:   External Document

## 2010-10-10 ENCOUNTER — Encounter: Payer: Self-pay | Admitting: Internal Medicine

## 2010-10-11 ENCOUNTER — Encounter (INDEPENDENT_AMBULATORY_CARE_PROVIDER_SITE_OTHER): Payer: Medicare Other

## 2010-10-11 DIAGNOSIS — I495 Sick sinus syndrome: Secondary | ICD-10-CM

## 2010-10-16 ENCOUNTER — Telehealth: Payer: Self-pay | Admitting: Pulmonary Disease

## 2010-10-16 NOTE — Telephone Encounter (Signed)
Per Dr. Kriste Basque  That is fine  Cont with dr. Martin Majestic

## 2010-10-16 NOTE — Telephone Encounter (Signed)
Spoke with pt and notified okay per SN to take this med and needs to cont to f/u with Dr. Dareen Piano.  Pt verbalized understanding.

## 2010-10-16 NOTE — Telephone Encounter (Signed)
LMOMTCB

## 2010-10-16 NOTE — Telephone Encounter (Signed)
Spoke with patient-aware to continue dr Linwood Dibbles recs.

## 2010-10-16 NOTE — Telephone Encounter (Signed)
Spoke with pt.  He states that Dr Dareen Piano wanted to start him on med called Simponi. Pt wants to know what SN thinks about this.  Please advise thanks

## 2010-10-28 ENCOUNTER — Encounter: Payer: Self-pay | Admitting: *Deleted

## 2010-11-12 LAB — GLUCOSE, CAPILLARY: Glucose-Capillary: 175 mg/dL — ABNORMAL HIGH (ref 70–99)

## 2010-11-12 LAB — DIFFERENTIAL
Eosinophils Absolute: 0 10*3/uL (ref 0.0–0.7)
Eosinophils Relative: 0 % (ref 0–5)
Lymphocytes Relative: 14 % (ref 12–46)
Lymphs Abs: 1.3 10*3/uL (ref 0.7–4.0)
Monocytes Absolute: 0.7 10*3/uL (ref 0.1–1.0)
Monocytes Relative: 7 % (ref 3–12)

## 2010-11-12 LAB — CBC
MCHC: 33.4 g/dL (ref 30.0–36.0)
Platelets: 268 10*3/uL (ref 150–400)
RBC: 4.76 MIL/uL (ref 4.22–5.81)
WBC: 9.9 10*3/uL (ref 4.0–10.5)

## 2010-11-12 LAB — COMPREHENSIVE METABOLIC PANEL
ALT: 24 U/L (ref 0–53)
AST: 30 U/L (ref 0–37)
Albumin: 3.8 g/dL (ref 3.5–5.2)
CO2: 25 mEq/L (ref 19–32)
Calcium: 8.8 mg/dL (ref 8.4–10.5)
GFR calc Af Amer: >60 mL/min (ref >60)
GFR calc non Af Amer: >60 mL/min (ref >60)
Sodium: 134 mEq/L — ABNORMAL LOW (ref 135–145)

## 2010-11-12 LAB — SAMPLE TO BLOOD BANK

## 2010-11-12 LAB — POCT CARDIAC MARKERS: CKMB, poc: 2.6 ng/mL (ref 1.0–8.0)

## 2010-11-21 ENCOUNTER — Encounter: Payer: Self-pay | Admitting: Pulmonary Disease

## 2010-11-23 ENCOUNTER — Ambulatory Visit: Payer: Self-pay | Admitting: Pulmonary Disease

## 2010-11-29 ENCOUNTER — Telehealth: Payer: Self-pay | Admitting: Pulmonary Disease

## 2010-11-29 MED ORDER — HYDROCHLOROTHIAZIDE 25 MG PO TABS: ORAL_TABLET | ORAL | Status: DC

## 2010-11-29 NOTE — Telephone Encounter (Signed)
lmomtcb x1 to advise rx was sent to pharmacy 

## 2010-11-29 NOTE — Telephone Encounter (Signed)
Left a detailed msg to let the pt know his rx has been sent to his pharmacy.

## 2010-12-06 ENCOUNTER — Ambulatory Visit: Payer: Self-pay | Admitting: Pulmonary Disease

## 2010-12-11 NOTE — Assessment & Plan Note (Signed)
[facility]                         ELECTROPHYSIOLOGY OFFICE NOTE   AUSTYN, SEIER                      MRN:          [id number]  DATE:05/18/2007                            DOB:          1934-04-27    Mr. Fluke has a history of sinus node dysfunction with syncope. He is  status post pacemaker for the above which has now reached ERI.   He is doing quite well without chest pain or shortness of breath. He has  noted some peripheral edema.   He takes over-the-counter potassium gluconate in addition to his  hydrochlorothiazide 25 mg. He also is on Flomax.   On examination today, his blood pressure was quite elevated at 147/96.  His lungs were clear.  His heart sounds were regular.  His extremities were without edema.   Interrogation of his Medtronic Kappa 701 pulse generator demonstrates  that he is at ERI with a KDR dual-chamber device. We will only have his  ventricular impedance which is 806.   IMPRESSION:  1. Syncope with sinus node dysfunction.  2. Status post pacemaker for the above, now at elective replacement      indicator, and reverted to VVI.  3. Peripheral edema.  4. Hypertension.   Mr. Macintyre and I had a lengthy discussion regarding sodium intake and  fluid intake. He will try and decrease these and see if this does not  abrogate his fluid issue and evaluate his blood pressure. In the event  that it does not, I have given him a prescription for furosemide 10 mg  to take as needed as an alternative to his hydrochlorothiazide.   We have also discussed the fact that he is now at [facility], and he will need  device generator replacement. We have discussed the potential benefits  as well as the potential risks, specifically and primarily infection. He  understands these risk and is willing to proceed.     Duke Salvia, MD, [facility]  Electronically Signed    SCK/MedQ  DD: 05/18/2007  DT: 05/19/2007  Job #: [id number]

## 2010-12-11 NOTE — Op Note (Signed)
NAME:  Tony Cruz, Tony Cruz NO.:  [id number]   MEDICAL RECORD NO.:  [id number]          PATIENT TYPE:  OIB   LOCATION:  2852                         FACILITY:  [facility]   PHYSICIAN:  Duke Salvia, MD, FACCDATE OF BIRTH:  Nov 27, 1933   DATE OF PROCEDURE:  06/19/2007  DATE OF DISCHARGE:                               OPERATIVE REPORT   PREOPERATIVE DIAGNOSIS:  Sinus node dysfunction with previously  implanted pacemaker now at end of life.   POSTOPERATIVE DIAGNOSIS:  Sinus node dysfunction with previously  implanted pacemaker now at end of life.   PROCEDURE:  Explantation of a previously implanted device, implantation  of a new device.   Following the attainment of informed consent, the patient was brought to  the electrophysiology laboratory and placed on the fluoroscopic table in  supine position. After routine prep and drape of the left upper chest,  lidocaine was infiltrated in the prepectoral subclavicular region. An  incision was made and carried down to the level of the prepectoral  fascia and the device pocket using sharp dissection.  The pocket was  opened. It was noted that the lead was a little bit lateral to the  device.  The device pocket was opened up in its entirety, the device was  explanted.  The previously implanted ventricular lead was a Medtronic  [id number], serial number [id number] and atrial lead was a [id number], serial number  [id number]. Interrogation of these leads demonstrated a P-wave of 1.4  with impedance of 615, a threshold 0.6 at 0.5. The R-wave was 13.3 with  impedance of 686, a threshold 0.6 at 0.5. With these acceptable  parameters recorded, the ventricular lead was marked with a tie and the  leads were then attached to a Medtronic ADAPTA ADDR01 pulse generator  serial number [id number]. Sensing and pacing were identified.  The  pocket was then copiously irrigated with antibiotic containing saline  solution.  Hemostasis was ensured in the leads  and the pulse generator  were placed in the pocket and secured to the prepectoral fascia.  The  wound was closed in three layers in the normal fashion using Monocryl  for the first two layers and Vicryl for the last layer.  A Steri-Strip  dressing was applied.   The patient tolerated the procedure without apparent complications.      Duke Salvia, MD, [facility]  Electronically Signed     SCK/MEDQ  D:  06/19/2007  T:  06/19/2007  Job:  [id number]   cc:   Electrophysiology Laboratory  [facility]

## 2010-12-11 NOTE — Assessment & Plan Note (Signed)
[facility]                         ELECTROPHYSIOLOGY OFFICE NOTE   Tony Cruz, Tony Cruz                      MRN:          [id number]  DATE:09/04/2007                            DOB:          Feb 22, 1934    Tony Cruz was seen following a pacemaker generator replacement 3 months  ago.  He is doing quite well.  He has lost 5 to 6 pounds.  He has had  much less peripheral edema.  The hydrochlorothiazide that we had  prescribed has now run out.   MEDICATIONS:  His other medications are unchanged.   EXAMINATION:  VITAL SIGNS:  On examination, the blood pressure what was  acceptable 129/74.  His weight was 220, down from 226, his pulse was 86.  LUNGS:  Were clear.  HEART:  Sounds were regular.  EXTREMITIES:  Without edema.  The device pocket was well-healed.   Interrogation of his Medtronic pulse generator demonstrated a P-wave of  1 with impedance of 705, a threshold 0.5.  The R-wave was 11 with a  pacing impedance of 756, a threshold 0.75 at 0.4,  battery voltage 2.78.  He is 3% atrial paced, essentially 0% ventricularly paced.   IMPRESSION:  1. Syncope with sinus node dysfunction.  2. Status post pacemaker.  3. Peripheral edema - improved.  4. Hypertension - improved.   Tony Cruz is stable, will be seen again in 9 months' time.  He will  follow up with Dr. Kriste Basque in the interim.     Duke Salvia, MD, [facility]  Electronically Signed    SCK/MedQ  DD: 09/04/2007  DT: 09/05/2007  Job #: [id number]

## 2010-12-11 NOTE — Assessment & Plan Note (Signed)
[facility]                         ELECTROPHYSIOLOGY OFFICE NOTE   SALIM, FORERO                      MRN:          [id number]  DATE:05/31/2008                            DOB:          05-03-1934    HISTORY OF PRESENT ILLNESS:  Mr. Victory is seen in followup for a  pacemaker implant for syncope with sinus node dysfunction.  He is doing  quite well since his generator was replaced about a year ago.  He has no  complaints of chest pain or shortness of breath.   MEDICATIONS:  His medication list is legion and is not recounted here  except to note that he is on Flomax and hydrochlorothiazide.   PHYSICAL EXAMINATION:  His blood pressure is elevated today at 147/95,  his pulse was 82.  He notes that his home blood pressures are much  better in the 125 range.  Otherwise, his examination was notable for  clear lungs.  Regular heart sounds without murmurs or gallops.  The  abdomen was soft.  Extremities had no edema.   Interrogation of Medtronic adaptive pulse generator demonstrates a P-  wave of 1 with impedance of 680, threshold 0.625 at 0.4, the R-wave is  11.2 with impedance of 795, a threshold 0.87 at 0.4.  Battery voltage  2.77.   IMPRESSION:  1. Sinus node dysfunction with syncope.  2. Status post pacemaker for the above.  3. Hypertension - Perhaps white coat.   Mr. Fonder has recorded his blood pressures at home.  We will have him  follow up with his primary care physician about this and we will see him  again in 6 months' time in the clinic.     Duke Salvia, MD, [facility]  Electronically Signed    SCK/MedQ  DD: 05/31/2008  DT: 05/31/2008  Job #: [id number]

## 2010-12-11 NOTE — Discharge Summary (Signed)
NAME:  DEMARION, PONDEXTER NO.:  [id number]   MEDICAL RECORD NO.:  [id number]          PATIENT TYPE:  OIB   LOCATION:  2852                         FACILITY:  [facility]   PHYSICIAN:  Duke Salvia, MD, FACCDATE OF BIRTH:  December 30, 1933   DATE OF ADMISSION:  06/19/2007  DATE OF DISCHARGE:  06/19/2007                               DISCHARGE SUMMARY   This patient has QUESTIONABLE ALLERGY TO SHELLFISH AND ADENOSINE.  They  had to stop the stress test.  He says he never wants to feel like that  again.   FINAL DIAGNOSES:  1. Pacemaker at elective replacement indicator.  2. June 19, 2007:  Explant of existing dual-chamber pacemaker with      implantation of a Medtronic Adapta ADDR01 dual-chamber pacemaker.  3. Pacemaker implanted in 1999 for history of syncope and sinus node      dysfunction.   SECONDARY DIAGNOSES:  1. Hypertension.  2. Peripheral neuropathy, unknown etiology.  3. History of peripheral edema.  4. Gastroesophageal reflux disease.  5. Barrett's esophagus.  6. Hiatal hernia.  7. Irritable bowel syndrome.  8. Diverticulosis.  9. Status post cholecystectomy, status post appendectomy.   PROCEDURE:  June 19, 2007:  Generator change with implantation of a  Medtronic Adapta dual-chamber pacemaker, Dr. Duke Salvia.  All  values within normal limits.  At discharge the patient is not  complaining of pain or hematoma at the site of the pacemaker  implantation.   BRIEF HISTORY:  Mr. Fuhs is a 77 year old male.  He has been recently  seen in the office for device interrogation that shows that it is at  elective replacement indicator.  He presents electively for generator  change.  The patient had originally an episode of [place].  He had an abnormal stress test with  subsequent left heart catheterization which showed normal coronary  anatomy.  He then underwent pacemaker implantation, a Medtronic Kappa  dual-chamber pacemaker was implanted.  The patient has done well since  then, has had no recurrent syncope.  He did have one episode a couple  years ago on April 08, 2000, when he felt dizzy, as if he would pass  out, but he has had none since then.  The patient will present  electively for generator change.   HOSPITAL COURSE:  The patient presents electively on November 21st.  He  has explantation of his existing Medtronic Kappa dual-chamber pacemaker  with implantation of the Medtronic Adapta dual-chamber pacemaker,  discharging the same day on the following medications:  1. Flomax 0.4 mg daily.  2. Enteric-coated aspirin 81 mg daily.  3. Hydrochlorothiazide 25 mg daily.  4. Omeprazole 20 mg daily at night.  5. Loratadine 10 mg daily.  6. Terazosin 4 mg at night daily.  7. Calcium with vitamin D daily.  8. Potassium gluconate 595 mg daily.   The patient is asked to take the bandage off Saturday and to leave the  incision open to the air.  He is to keep the incision dry for next 7  days,  to sponge bathe until Friday, November 28.  Office visit at the  pacer clinic, [facility], Wednesday, December 10, at 9 o'clock.   He has the following lab studies:  White cells 6.6, hemoglobin 13,  hematocrit 39, platelets 256.  Protime 12.8, INR 1.1, sodium 135,  potassium 3.6, chloride 99, carbonate 27, glucose 104, BUN is 10,  creatinine is 1.      Maple Mirza, [place]      Duke Salvia, MD, [facility]  Electronically Signed    GM/MEDQ  D:  06/19/2007  T:  06/19/2007  Job:  [id number]   cc:   Duke Salvia, MD, [facility]  Lonzo Cloud. Kriste Basque, MD

## 2010-12-14 NOTE — Letter (Signed)
November 10, 2006    Casimiro Needle L. Thad Ranger, M.D.  [address]  [address]  [place],  [place] [address]   RE:  Tony Cruz, Tony Cruz  MRN:  [id number]  /  DOB:  December 30, 1933   Dear Kathlene November,   Tony Cruz is a 77 year old gentleman who I am referring to you  for evaluation of neuropathy.  He notices numbness involving his right  thigh going down his leg which has been intermittent over the last year  or so.  In addition, he has numbness in his feet bilaterally which has  been of longer duration and appears to be getting worse by his history.  He has no known reason to have a peripheral neuropathy, no history of  diabetes and no history of back problems.  He is getting increasingly  concerned about these symptoms and would like a neurologic evaluation  for further evaluation and search for a potential etiology.   I have attended Tony Cruz for general medical purposes.  He had a  history of syncope which was never well-delineated but felt to be  neurally mediated, and he had a pacemaker placed by Dr. Graciela Husbands several  years ago.  He has no known recurrence since that time.  He had a normal  heart catheterization and negative Cardiolite.  He has mild hypertension  controlled on hydrochlorothiazide.  He has some GI problems, including  hiatus hernia, gastroesophageal reflux disease, and a mild Barrett's  esophagus followed by Dr. Sherin Quarry.  He also has diverticulosis and  irritable bowel syndrome.  He has had a previous cholecystectomy and  appendectomy.   His current medications include hydrochlorothiazide 25 mg daily,  omeprazole 20 mg daily, potassium 20 mEq daily, fish oil, enteric-coated  aspirin, and over-the-counter Claritin and Flonase.   This note is a request for an appointment for Tony Cruz for evaluation  of his neuropathy, and as always, I appreciate your consultation.    Sincerely,      Lonzo Cloud. Kriste Basque, MD  Electronically Signed    SMN/MedQ  DD: 11/10/2006  DT: 11/10/2006   Job #: [id number]

## 2010-12-14 NOTE — Assessment & Plan Note (Signed)
[facility]                           ELECTROPHYSIOLOGY OFFICE NOTE   PATTON, RABINOVICH                      MRN:          [id number]  DATE:05/06/2006                            DOB:          Sep 29, 1933    Mr. Caswell is status post pacemaker implantation for recurrent neurally  mediated syncope and since his device has been implanted he has had no  episodes.  He continues to have multiple episodes of rate drop which we  presume are appropriate and are modulating his symptoms.   Review of his medications demonstrate that they are unchanged except for the  intercurrent addition of Flomax which he is tolerating.   On examination today his blood pressure was mildly elevated at 114/94, his  pulse was recorded as 101 but I got 84.  Lungs were clear.  Heart sounds  were regular.  The extremities were without edema.   Interrogation of his Medtronic Kappa 701 pulse generator demonstrated P wave  of 2.8 with impedance of 73 to a threshold of 0.5 and 0.4.  R wave of 15.6  with impedance of 840 to a threshold of 0.75 and 0.4.  Battery voltage was  2.67 with estimated longevity of 14%.  He is atrial sensed essentially 100%  of the time but he does have a number of rate drop episodes mostly occurring  at night.   IMPRESSION:  1. Neurally mediated syncope.  2. Status post pacer for the above.  3. Obesity.  4. Borderline hypertension.   I have encouraged Mr. Edelson to work on his weight.  I am surprised that he  is tolerating Flomax as well as he is and we will just have to keep a close  eye on this as it can aggravate neurally mediated syncope.   His blood pressure is high today but he will be seeing Dr. Kriste Basque on Friday.   We will see him as part of the device followup clinic.            ______________________________  Duke Salvia, MD, [facility]    SCK/MedQ  DD:  05/06/2006  DT:  05/07/2006  Job #:  [id number]

## 2010-12-19 ENCOUNTER — Encounter: Payer: Self-pay | Admitting: Pulmonary Disease

## 2010-12-19 ENCOUNTER — Ambulatory Visit (INDEPENDENT_AMBULATORY_CARE_PROVIDER_SITE_OTHER): Payer: Medicare Other | Admitting: Pulmonary Disease

## 2010-12-19 VITALS — BP 120/74 | HR 79 | Temp 98.0°F | Ht 69.0 in | Wt 204.2 lb

## 2010-12-19 DIAGNOSIS — G4733 Obstructive sleep apnea (adult) (pediatric): Secondary | ICD-10-CM

## 2010-12-19 NOTE — Patient Instructions (Signed)
Continue with cpap, and work on weight loss Keep up with supplies and mask changes. followup with me in one year.  

## 2010-12-19 NOTE — Assessment & Plan Note (Signed)
The pt is doing very well with cpap, and reports no tolerance issues with mask or pressure.  He is sleeping well, and reports no breakthru snoring or daytime sleepiness issues. He has lost weight since the last visit.  I have asked him to continue with cpap, and to work on continued weight loss.

## 2010-12-19 NOTE — Progress Notes (Signed)
  Subjective:    Patient ID: Tony Cruz, male    DOB: 02-03-1934, 77 y.o.   MRN: [id number]  HPI The pt comes in today for f/u of his known osa.  He is wearing cpap compliantly, and reports no issues with tolerance.  He is currently using a loaner machine since his is being repaired.  He feels he sleeps well, and denies any daytime alertness issues.     Review of Systems  Constitutional: Negative for fever and unexpected weight change.  HENT: Negative for ear pain, nosebleeds, congestion, sore throat, rhinorrhea, sneezing, trouble swallowing, dental problem, postnasal drip and sinus pressure.   Eyes: Positive for itching. Negative for redness.  Respiratory: Negative for cough, chest tightness, shortness of breath and wheezing.   Cardiovascular: Negative for palpitations and leg swelling.  Gastrointestinal: Negative for nausea and vomiting.  Genitourinary: Negative for dysuria.  Musculoskeletal: Negative for joint swelling.  Skin: Negative for rash.  Neurological: Negative for headaches.  Hematological: Does not bruise/bleed easily.  Psychiatric/Behavioral: Negative for dysphoric mood. The patient is not nervous/anxious.        Objective:   Physical Exam Ow male in nad No skin breakdown or pressure necrosis from cpap mask No LE edema, no cyanosis Alert, does not appear sleepy, moves all 4        Assessment & Plan:

## 2011-01-10 ENCOUNTER — Encounter: Payer: Self-pay | Admitting: *Deleted

## 2011-01-14 ENCOUNTER — Encounter: Payer: Self-pay | Admitting: *Deleted

## 2011-01-23 ENCOUNTER — Encounter: Payer: Self-pay | Admitting: Pulmonary Disease

## 2011-01-24 ENCOUNTER — Other Ambulatory Visit (INDEPENDENT_AMBULATORY_CARE_PROVIDER_SITE_OTHER): Payer: Medicare Other

## 2011-01-24 ENCOUNTER — Ambulatory Visit (INDEPENDENT_AMBULATORY_CARE_PROVIDER_SITE_OTHER): Payer: Medicare Other | Admitting: Pulmonary Disease

## 2011-01-24 DIAGNOSIS — K589 Irritable bowel syndrome without diarrhea: Secondary | ICD-10-CM

## 2011-01-24 DIAGNOSIS — G4733 Obstructive sleep apnea (adult) (pediatric): Secondary | ICD-10-CM

## 2011-01-24 DIAGNOSIS — I10 Essential (primary) hypertension: Secondary | ICD-10-CM

## 2011-01-24 DIAGNOSIS — K5730 Diverticulosis of large intestine without perforation or abscess without bleeding: Secondary | ICD-10-CM

## 2011-01-24 DIAGNOSIS — M1990 Unspecified osteoarthritis, unspecified site: Secondary | ICD-10-CM

## 2011-01-24 DIAGNOSIS — K219 Gastro-esophageal reflux disease without esophagitis: Secondary | ICD-10-CM

## 2011-01-24 DIAGNOSIS — N138 Other obstructive and reflux uropathy: Secondary | ICD-10-CM

## 2011-01-24 DIAGNOSIS — N401 Enlarged prostate with lower urinary tract symptoms: Secondary | ICD-10-CM

## 2011-01-24 DIAGNOSIS — R7309 Other abnormal glucose: Secondary | ICD-10-CM

## 2011-01-24 DIAGNOSIS — G609 Hereditary and idiopathic neuropathy, unspecified: Secondary | ICD-10-CM

## 2011-01-24 LAB — CBC WITH DIFFERENTIAL/PLATELET
Basophils Absolute: 0.1 10*3/uL (ref 0.0–0.1)
Eosinophils Absolute: 0.4 10*3/uL (ref 0.0–0.7)
Hemoglobin: 14.3 g/dL (ref 13.0–17.0)
Lymphocytes Relative: 35.8 % (ref 12.0–46.0)
MCHC: 34.3 g/dL (ref 30.0–36.0)
Monocytes Relative: 10.8 % (ref 3.0–12.0)
Neutro Abs: 3.4 10*3/uL (ref 1.4–7.7)
Neutrophils Relative %: 47.1 % (ref 43.0–77.0)
Platelets: 252 10*3/uL (ref 150.0–400.0)
RDW: 15.6 % — ABNORMAL HIGH (ref 11.5–14.6)

## 2011-01-24 LAB — HEMOGLOBIN A1C: Hgb A1c MFr Bld: 6.4 % (ref 4.6–6.5)

## 2011-01-24 LAB — LIPID PANEL
Cholesterol: 176 mg/dL (ref 0–200)
HDL: 53.9 mg/dL (ref >39.00)
VLDL: 18 mg/dL (ref 0.0–40.0)

## 2011-01-24 LAB — HEPATIC FUNCTION PANEL
AST: 26 U/L (ref 0–37)
Albumin: 4.5 g/dL (ref 3.5–5.2)
Alkaline Phosphatase: 62 U/L (ref 39–117)
Total Protein: 7.2 g/dL (ref 6.0–8.3)

## 2011-01-24 LAB — BASIC METABOLIC PANEL
BUN: 13 mg/dL (ref 6–23)
CO2: 28 mEq/L (ref 19–32)
GFR: 101.59 mL/min (ref >60.00)
Glucose, Bld: 102 mg/dL — ABNORMAL HIGH (ref 70–99)
Potassium: 4.3 mEq/L (ref 3.5–5.1)

## 2011-01-24 LAB — TSH: TSH: 0.61 u[IU]/mL (ref 0.35–5.50)

## 2011-01-24 NOTE — Progress Notes (Signed)
Subjective:    Patient ID: Tony Cruz, male    DOB: July 05, 1934, 77 y.o.   MRN: [id number]  HPI 77 y/o WM here for a follow up visit... he has multiple medical problems as noted below...   ~  Apr10:  [name] looks well and he has had a busy year- he is followed here, at the [facility], and thru a number of specialty offices... as usual he brought a list of things that he said he wanted to talk about:  (we reviewed his list of concerns from last yr- see EMR note 11/02/07) 1~  Another vasovagal episode 08/15/08:  he last saw DrKlein 11/09 & doing OK- no changes made... he did some reading on the internet and wonders about a Rx for atropine- we discussed this & NO. 2~  LBP & Stiffness:  saw chiropractor briefly w/ adjustments but no relief... he hasn't taken any meds... we discussed Ortho eval w/ XRays- he requests DrCollins... 3~  Left thumb pain:  try MOBIC 7.5mg /d... 4~  Pt's concern for DM- check BS w/ A1c (113/ 6.3)... needs better diet & get weight down. 5~  Pt notes ?gas moving in Abd? discomfort- I note waist 43" and pants size 38... discussed diet + exercise, loosen belt, and antigas meds...  ~  November 02, 2009:  he continues w/ mult somatic complaints & has had follow up evals from his various specialty physicians (see below)... today he requests referral to DrClance for Sleep Med and DrAnderson for Rheum ("arthritis doctor")...  **we discussed diet + exercise sufficient to aide weight loss;  he requests 90d refill of meds & would like copy of labs mailed to him...  ~  January 24, 2011:  57mo ROV & he reports "another exciting year of retirement"> c/o neuropathy symptoms & arthritis esp in hands w/ recent eval by DrAnderson & on a trial of HUMIRA injections now (we don't have his note)...    He saw DrClance 4/11 for Sleep Med consult> hx mild OSA from sleep study at the [facility] ?AHI=9-21? & titrated to CPAP=11 & tol well;  He was asked to work on weight reduction...    He saw DrKlein 12/11 for f/u  syncope secondary to sinus node dysfunction, treated w/ pacer & it was recently replaced;  He noted intercurrent syncope that was felt to be vasovagal in origin (this [facility] while waiting for his wife, & he has a long hx of this from blood draws over the yrs) & DrKlein reprogramed his device; EKG w/ SBrady & otherw WNL.Marland KitchenMarland Kitchen    He saw DrMJohnson for GI & had an EGD 5/11 which was essentially neg...    He also had a thorough neurologic evaluation 10/11 from one of his church physicians, DrHickling, for f/u polyneuropathy> balance issues, LBP, gait abn, variable paresthesias, foot numbness (sl decr vibration & pin prick), some subjective memory problems w/ norm MMSE, abnormal NCVs;  Prev Labs were all WNL, unrevealing;  Etiology of his neuropathy is unknown= cryptogenic, and since it isn't painful he really didn't require any therapy...    LABS today showed FLP looking good on Fish Oil alone; Chems= wnl w/ BS=104 & A1c=6.4, CBC=norm, TSH wnl...   Problem List:   OBSTRUCTIVE SLEEP APNEA (ICD-327.23) - evaluation, diagnosis, and Rx by the [facility] (their notes scanned into the EMR):  he had PSG 4/08 w/ AHI= 26, desat to 83%... placed on CPAP at 11cm H20 pressure... he also has some RLS &  was on Requip transiently from the [place]. ~  4/11: pt indicates that he uses his CPAP 4/7 nights for 6-7 hr/night; he feels that he rests well, denies daytime hypersom, snoring, etc... he requests Sleep consult here in [place] (see DrClance consult).  ALLERGIC RHINITIS (ICD-477.9) - controlled on Claritin, and OTC meds...  HYPERTENSION (ICD-401.9) - on ASA 81mg /d & HCTZ 25mg /d... BP=122/82 today and he denies HA, fatigue, visual changes, CP, palipit, dizziness, syncope, dyspnea, edema, etc... ~  CXR 4/11 is clear and WNL, NAD...  Hx of SYNCOPE (ICD-780.2) & CARDIAC PACEMAKER IN SITU (ICD-V45.01) - he had pacer changed 11/08 DrKlein (hosp DC Summary reviewed)... he continues regular pacer checks and f/u w/  DrKlein...  ~  cath 3/99 DrJoeLeB showed norm coronaries, norm LV, norm Ao... ~  He saw DrKlein 12/11 for f/u syncope secondary to sinus node dysfunction, treated w/ pacer & it was recently replaced;  He noted intercurrent syncope that was felt to be vasovagal in origin (this [facility] while waiting for his wife, & he has a long hx of this from blood draws over the yrs) & DrKlein reprogramed his device; EKG w/ SBrady & otherw WNL...  DIABETES MELLITUS, BORDERLINE (ICD-790.29) - he's been concerned about his BS due to [facility] and his neuropathy symptoms, but his BS has only been borderline elvated prev...  ~  labs 4/10 (wt=209#) showed BS= 113, A1c= 6.3.Marland Kitchen. rec> better diet, get wt down. ~  labs 4/11 (wt=222#) showed BS= 109, A1c= 6.2.Marland Kitchen. no meds yet, just needs to get wt down. ~  Labs 6/12 (wt=204#) showed BS= 102, A1v=6.4  OVERWEIGHT (ICD-278.02) - we discussed diet + exercise therapy (again)... ~  weight 4/09 = 226#.Marland KitchenMarland Kitchen 5" 9" Tall for a BMI= 33-34 ~  weight 4/10 = 209#... BMI down to 31... keep up the good work. ~  weight 4/11 = 222# .Marland KitchenMarland Kitchen BMI up to 33. ~  Weight 6/12 = 204#  GASTROESOPHAGEAL REFLUX DISEASE (ICD-530.81) w/ BARRETT'S ESOPH - followed by DrWeissman & Rx w/ OMEPRAZOLE 20mg Bid... ~  prev EGD's documented a 5cmHH & Barrett's esoph... (last 1/03 by DrPatterson). ~  last EGD was 2/08 showing Barrett's epithelium in lower 1/3 of esoph... f/u planned 26yrs. ~  4/11:  NOTE> f/u EGD is due, he will decide on gastroenterologist ==> he saw DrMJohnson w/ neg EGD 5/11...  DIVERTICULOSIS OF COLON (ICD-562.10) -  ~  last colonoscopy 2/08 by DrWeissman showed left sided divertics, otherw neg... f/u planned 22yrs...  IRRITABLE BOWEL SYNDROME (ICD-564.1)  BENIGN PROSTATIC HYPERTROPHY, WITH OBSTRUCTION (ICD-600.01) - on FLOMAX 0.4mg /d & Saw Palmetto... eval and Rx by [facility] who checks pt q33mo (we don't have notes from him)... pt tells me he will be seeing DrDavis regularly  when he moves to [facility].  OSTEOARTHRITIS (ICD-715.90) > as above- now managed by DrAnderson & on Humira Rx trial... BACK PAIN (ICD-724.5) - pt saw DrBeane 6/10 for eval> neuropathy symptoms & lumbar spondylosis on XRays, Rx MOBIC 7.5mg  Prn... offered Myelogram for further eval... he also takes Calcium & Vit D.  PERIPHERAL NEUROPATHY (ICD-356.9) - DrReynolds evaluated him w/ NCV's etc and dx a peripheral neuropathy... this is one of his CC> numbness, decr sensation in feet, (no pain) w/ some assoc balance problems intermittently...  he tried Mentanx but no benefit...  ~  4/11: I reviewed DrReynolds 11/10 note w/ the patient... ~  He also had a thorough neurologic evaluation 10/11 from one of his church physicians, DrHickling, for f/u polyneuropathy> balance  issues, LBP, gait abn, variable paresthesias, foot numbness (sl decr vibration & pin prick), some subjective memory problems w/ norm MMSE, abnormal NCVs;  Prev Labs were all WNL, unrevealing;  Etiology of his neuropathy is unknown= cryptogenic, and since it isn't painful he really didn't require any therapy...  Health Maintenance - he takes a number of Vits + lecithin, saw palmetto, Fish Oil, etc... he saw DrDJones in 2010 for Derm review- pt reports nothing major found...   Past Surgical History  Procedure Date  . Appendectomy   . Cholecystectomy   . Tonsillectomy   . Pacemaker placement     Outpatient Encounter Prescriptions as of 01/24/2011  Medication Sig Dispense Refill  . Adalimumab (HUMIRA [place]) Inject into the skin. One injection every 2 weeks       . aspirin 81 MG tablet Take 81 mg by mouth daily.        . calcium carbonate (OS-CAL) 600 MG TABS Take 600 mg by mouth daily.        . Cholecalciferol (VITAMIN D) 1000 UNITS capsule Take 1,000 Units by mouth daily.        . hydrochlorothiazide 25 MG tablet Take 1 tablet once a day  90 tablet  3  . Lecithin 1200 MG CAPS Take 1 capsule by mouth daily.        Marland Kitchen loratadine (CLARITIN) 10 MG  tablet Take 10 mg by mouth daily.        . Multiple Vitamin (MULTIVITAMIN) capsule Take 1 capsule by mouth daily.        . Naproxen Sodium (ALEVE) 220 MG CAPS Take 1 capsule by mouth daily.        Marland Kitchen nystatin-triamcinolone (MYCOLOG II) cream as needed.      . Omega-3 Fatty Acids (FISH OIL) 500 MG CAPS Take 1 capsule by mouth daily.        Marland Kitchen omeprazole (PRILOSEC) 20 MG capsule Take 20 mg by mouth daily.       . saw palmetto 160 MG capsule Take 160 mg by mouth daily.        . Tamsulosin HCl (FLOMAX) 0.4 MG CAPS Take 0.4 mg by mouth daily.        . traMADol (ULTRAM) 50 MG tablet Take 50 mg by mouth 2 (two) times daily as needed.          No Known Allergies   Current Medications, Allergies, Past Medical History, Past Surgical History, Family History, and Social History were reviewed in [employer] record.   Review of Systems        The patient complains of sleep disorder, dyspnea on exertion, gas/bloating, joint pain, stiffness, arthritis, paresthesias, and difficulty walking.  The patient denies fever, chills, sweats, anorexia, fatigue, weakness, malaise, weight loss, blurring, diplopia, eye irritation, eye discharge, vision loss, eye pain, photophobia, earache, ear discharge, tinnitus, decreased hearing, nasal congestion, nosebleeds, sore throat, hoarseness, chest pain, palpitations, syncope, orthopnea, PND, peripheral edema, cough, dyspnea at rest, excessive sputum, hemoptysis, wheezing, pleurisy, nausea, vomiting, diarrhea, constipation, change in bowel habits, abdominal pain, melena, hematochezia, jaundice, indigestion/heartburn, dysphagia, odynophagia, dysuria, hematuria, urinary frequency, urinary hesitancy, nocturia, incontinence, back pain, joint swelling, muscle cramps, muscle weakness, sciatica, restless legs, leg pain at night, leg pain with exertion, rash, itching, dryness, suspicious lesions, paralysis, seizures, tremors, vertigo, transient blindness, frequent  falls, frequent headaches, depression, anxiety, memory loss, confusion, cold intolerance, heat intolerance, polydipsia, polyphagia, polyuria, unusual weight change, abnormal bruising, bleeding, enlarged lymph nodes, urticaria, allergic rash, hay  fever, and recurrent infections.     Objective:   Physical Exam     WD, WN, 77 y/o WM in NAD... GENERAL:  Alert & oriented; pleasant & cooperative... HEENT:  [place]/AT, EOM-wnl, PERRLA, EACs-clear, TMs-wnl, NOSE-clear, THROAT-clear & wnl. NECK:  Supple w/ fairROM; no JVD; normal carotid impulses w/o bruits; no thyromegaly or nodules palpated; no lymphadenopathy. CHEST:  Clear to P & A; without wheezes/ rales/ or rhonchi. HEART:  Regular Rhythm; without murmurs/ rubs/ or gallops; pacer in left shoulder area... ABDOMEN:  Soft & nontender; normal bowel sounds; no organomegaly or masses detected. EXT: without deformities, mild arthritic changes; no varicose veins/ +venous insuffic/ no edema. NEURO:  CN's intact;  fair gait;  Motor normal, sensory variable deficits... strength seems OK, min decr sensation in LEs. DERM:  No lesions noted; no rash etc...   Assessment & Plan:   OSA>  Followed by DrClance & stable on CPAP...  HBP>  Controlled on meds, continue same...  Hx Syncope/ Pacer>  Followed by DrKlein & pacer functioning normally...  DM, borderline>  On diet alone & managing well, great job of wt reduction...  GERD, Hx Barrett's>  S/p EGD from DrJohnson last yr & it looked OK...  Divertics, IBS>  He won't be due for f/u colon until 2018 per DrJohnson...  BPH>  Followed by VHQIONGE at [facility], we don't have any recent notes...  DJD>  Followed by [facility] now & currently on trial Humira injections...  Periph Neuropathy>  Eval by Neuro, cryptogenic, no meds needed.Marland KitchenMarland Kitchen

## 2011-01-24 NOTE — Patient Instructions (Signed)
Today we updated your med list in our EPIC system...    Continue your current meds the same...  Today we did your follow up fasting blood work...    We will mail the lab results to you for your records...  Keep up the great job w/ your diet & exercise program...  Call for any questions...  Let's plan a follow up visit in a similar 18yr interval, sooner if needed for problems.Marland KitchenMarland Kitchen

## 2011-02-04 ENCOUNTER — Encounter: Payer: Self-pay | Admitting: Pulmonary Disease

## 2011-07-02 ENCOUNTER — Other Ambulatory Visit: Payer: Self-pay | Admitting: Internal Medicine

## 2011-07-02 ENCOUNTER — Ambulatory Visit (INDEPENDENT_AMBULATORY_CARE_PROVIDER_SITE_OTHER): Payer: Medicare Other | Admitting: Internal Medicine

## 2011-07-02 ENCOUNTER — Encounter: Payer: Self-pay | Admitting: Internal Medicine

## 2011-07-02 VITALS — BP 130/91 | HR 87 | Ht 69.0 in | Wt 214.1 lb

## 2011-07-02 DIAGNOSIS — R61 Generalized hyperhidrosis: Secondary | ICD-10-CM | POA: Insufficient documentation

## 2011-07-02 DIAGNOSIS — Z950 Presence of cardiac pacemaker: Secondary | ICD-10-CM | POA: Insufficient documentation

## 2011-07-02 DIAGNOSIS — R55 Syncope and collapse: Secondary | ICD-10-CM

## 2011-07-02 DIAGNOSIS — I498 Other specified cardiac arrhythmias: Secondary | ICD-10-CM

## 2011-07-02 LAB — PACEMAKER DEVICE OBSERVATION
AL AMPLITUDE: 2.8 mv
RV LEAD AMPLITUDE: 16 mv
RV LEAD IMPEDENCE PM: 765 Ohm
RV LEAD THRESHOLD: 0.88 V
VENTRICULAR PACING PM: 0.8

## 2011-07-02 NOTE — Assessment & Plan Note (Signed)
Will check TSH and ESR

## 2011-07-02 NOTE — Assessment & Plan Note (Signed)
Needs adjustment will refer back to clance

## 2011-07-02 NOTE — Patient Instructions (Addendum)
Remote monitoring is used to monitor your Pacemaker of ICD from home. This monitoring reduces the number of office visits required to check your device to one time per year. It allows [place] to keep an eye on the functioning of your device to ensure it is working properly. You are scheduled for a device check from home on October 03, 2011. You may send your transmission at any time that day. If you have a wireless device, the transmission will be sent automatically. After your physician reviews your transmission, you will receive a postcard with your next transmission date.  Your physician wants you to follow-up in: 12 months with Dr Graciela Husbands.  You will receive a reminder letter in the mail two months in advance. If you don't receive a letter, please call our office to schedule the follow-up appointment.  Your physician recommends that you have lab work today: tsh/sed rate

## 2011-07-02 NOTE — Assessment & Plan Note (Signed)
The patient's device was interrogated.  The information was reviewed. No changes were made in the programming.    

## 2011-07-02 NOTE — Progress Notes (Signed)
HPI  Tony Cruz is a 77 y.o. male is seen in followup for syncope secondary to sinus node dysfunction for which a pacemaker was previously implanted and recently changed out. He denies SOB, CP edema..  has had intercurrent syncope that was thought to be vasovagal   He notes his HR is a lttle higher,  His TSH was low normal in June.  He has noted night sweats over recent months  No change in appetite and weight is up  He has OSA; he does not know if his device has been adjusted or even if it is autoadjusting   Past Medical History  Diagnosis Date  . Allergic rhinitis, cause unspecified   . Obstructive sleep apnea (adult) (pediatric)   . Unspecified essential hypertension   . Syncope and collapse   . Cardiac pacemaker in situ   . Other abnormal glucose   . Overweight   . Esophageal reflux   . Diverticulosis of colon (without mention of hemorrhage)   . Irritable bowel syndrome   . Hypertrophy of prostate with urinary obstruction and other lower urinary tract symptoms (LUTS)   . Osteoarthrosis, unspecified whether generalized or localized, unspecified site   . Backache, unspecified   . Unspecified hereditary and idiopathic peripheral neuropathy     Past Surgical History  Procedure Date  . Appendectomy   . Cholecystectomy   . Tonsillectomy   . Pacemaker placement     Current Outpatient Prescriptions  Medication Sig Dispense Refill  . aspirin 81 MG tablet Take 81 mg by mouth daily.        . calcium carbonate (OS-CAL) 600 MG TABS Take 600 mg by mouth daily.        . Cholecalciferol (VITAMIN D) 1000 UNITS capsule Take 1,000 Units by mouth daily.        . hydrochlorothiazide 25 MG tablet Take 1 tablet once a day  90 tablet  3  . Lecithin 1200 MG CAPS Take 1 capsule by mouth daily.        Marland Kitchen loratadine (CLARITIN) 10 MG tablet Take 10 mg by mouth daily.        . Multiple Vitamin (MULTIVITAMIN) capsule Take 1 capsule by mouth daily.        . Naproxen Sodium (ALEVE) 220 MG CAPS  Take 1 capsule by mouth daily.        Marland Kitchen nystatin-triamcinolone (MYCOLOG II) cream as needed.      . Omega-3 Fatty Acids (FISH OIL) 500 MG CAPS Take 1 capsule by mouth daily.        Marland Kitchen omeprazole (PRILOSEC) 20 MG capsule Take 20 mg by mouth daily.       . saw palmetto 160 MG capsule Take 160 mg by mouth daily.        . Tamsulosin HCl (FLOMAX) 0.4 MG CAPS Take 0.4 mg by mouth daily.        . traMADol (ULTRAM) 50 MG tablet Take 50 mg by mouth 2 (two) times daily as needed.          No Known Allergies  Review of Systems negative except from HPI and PMH  Physical Exam Well developed and well nourished in no acute distress HENT normal E scleral and icterus clear Neck Supple JVP flat; carotids brisk and full Clear to ausculation Regular rate and rhythm, no murmurs gallops or rub Soft with active bowel sounds No clubbing cyanosis none Edema Alert and oriented, grossly normal motor and sensory function Skin Warm and Dry  Assessment and  Plan

## 2011-07-03 LAB — TSH: TSH: 1.56 u[IU]/mL (ref 0.35–5.50)

## 2011-07-22 ENCOUNTER — Encounter: Payer: Self-pay | Admitting: Internal Medicine

## 2011-07-22 ENCOUNTER — Other Ambulatory Visit: Payer: Self-pay | Admitting: Allergy

## 2011-07-22 MED ORDER — OMEPRAZOLE 20 MG PO CPDR: 20.0000 mg | DELAYED_RELEASE_CAPSULE | Freq: Every day | ORAL | Status: DC

## 2011-08-13 ENCOUNTER — Telehealth: Payer: Self-pay | Admitting: Pulmonary Disease

## 2011-08-13 NOTE — Telephone Encounter (Signed)
LMTCB

## 2011-08-14 NOTE — Telephone Encounter (Signed)
Pt says his BP readings have been up and down and he wants to know if his medications need to be adjusted. Pt says he does not think this is urgent and spoke with a nurse he knows about it. He is scheduled to see TP on Mon., 1/21 and will bring his list of BP readings for her to review.

## 2011-08-14 NOTE — Telephone Encounter (Signed)
LMOMTCB x 1 

## 2011-08-19 ENCOUNTER — Ambulatory Visit (INDEPENDENT_AMBULATORY_CARE_PROVIDER_SITE_OTHER): Payer: Medicare Other | Admitting: Adult Health

## 2011-08-19 ENCOUNTER — Ambulatory Visit: Payer: Medicare Other | Admitting: Adult Health

## 2011-08-19 ENCOUNTER — Encounter: Payer: Self-pay | Admitting: Adult Health

## 2011-08-19 VITALS — BP 132/78 | HR 69 | Temp 96.8°F | Ht 69.0 in | Wt 215.6 lb

## 2011-08-19 DIAGNOSIS — I10 Essential (primary) hypertension: Secondary | ICD-10-CM

## 2011-08-19 NOTE — Assessment & Plan Note (Addendum)
Blood pressure on upper limits of normal with intermittent elevation  Discussed several options w/ option of med addition vs lifestyle changes.  He opts for lifestyle changes with diet and weight loss.   Plan;  If not improved w/ better optimal control consider adding meds on return in 4 months for CPX Check blood pressure 3-4 times a week, keep log.  Call for b/p consistently  >140-150  Low salt diet  Weight loss.  Avoid decongestants, -sudafed meds  Decrease NSAIDS use- ie Aleve -use Tylenol instead.  Please contact office for sooner follow up if symptoms do not improve or worsen or seek emergency care

## 2011-08-19 NOTE — Progress Notes (Signed)
Subjective:    Patient ID: Tony Cruz, male    DOB: 02/16/1934, 78 y.o.   MRN: [id number]  HPI  78 y/o WM here for a follow up visit... he has multiple medical problems as noted below...   ~  Apr10:  Avontae looks well and he has had a busy year- he is followed here, at the [facility], and thru a number of specialty offices... as usual he brought a list of things that he said he wanted to talk about:  (we reviewed his list of concerns from last yr- see EMR note 11/02/07) 1~  Another vasovagal episode 08/15/08:  he last saw DrKlein 11/09 & doing OK- no changes made... he did some reading on the internet and wonders about a Rx for atropine- we discussed this & NO. 2~  LBP & Stiffness:  saw chiropractor briefly w/ adjustments but no relief... he hasn't taken any meds... we discussed Ortho eval w/ XRays- he requests DrCollins... 3~  Left thumb pain:  try MOBIC 7.5mg /d... 4~  Pt's concern for DM- check BS w/ A1c (113/ 6.3)... needs better diet & get weight down. 5~  Pt notes ?gas moving in Abd? discomfort- I note waist 43" and pants size 38... discussed diet + exercise, loosen belt, and antigas meds...  ~  November 02, 2009:  he continues w/ mult somatic complaints & has had follow up evals from his various specialty physicians (see below)... today he requests referral to DrClance for Sleep Med and DrAnderson for Rheum ("arthritis doctor")...  **we discussed diet + exercise sufficient to aide weight loss;  he requests 90d refill of meds & would like copy of labs mailed to him...  ~  January 24, 2011:  38mo ROV & he reports "another exciting year of retirement"> c/o neuropathy symptoms & arthritis esp in hands w/ recent eval by DrAnderson & on a trial of HUMIRA injections now (we don't have his note)...    He saw DrClance 4/11 for Sleep Med consult> hx mild OSA from sleep study at the [facility] ?AHI=9-21? & titrated to CPAP=11 & tol well;  He was asked to work on weight reduction...    He saw DrKlein 12/11 for  f/u syncope secondary to sinus node dysfunction, treated w/ pacer & it was recently replaced;  He noted intercurrent syncope that was felt to be vasovagal in origin (this [facility] while waiting for his wife, & he has a long hx of this from blood draws over the yrs) & DrKlein reprogramed his device; EKG w/ SBrady & otherw WNL.Marland KitchenMarland Kitchen    He saw DrMJohnson for GI & had an EGD 5/11 which was essentially neg...    He also had a thorough neurologic evaluation 10/11 from one of his church physicians, DrHickling, for f/u polyneuropathy> balance issues, LBP, gait abn, variable paresthesias, foot numbness (sl decr vibration & pin prick), some subjective memory problems w/ norm MMSE, abnormal NCVs;  Prev Labs were all WNL, unrevealing;  Etiology of his neuropathy is unknown= cryptogenic, and since it isn't painful he really didn't require any therapy...    LABS today showed FLP looking good on Fish Oil alone; Chems= wnl w/ BS=104 & A1c=6.4, CBC=norm, TSH wnl...  ~08/19/2011 Acute OV  Pt presents for elevated blood pressure over last 2 weeks. No symptoms. Has been checking at church and noticed to be elevated on several occasions. Seems to flucuate between 122-157/84-96.  Has been feeling fine. He denies any chest pain, headache,  visual changes, speech changes are, weakness, or swelling. Does take Aleve on most days. Denies any Sudafed intake. Has had some weight gain over the last 6 months. Patient is active and does walk on a regular basis.    Problem List:   OBSTRUCTIVE SLEEP APNEA (ICD-327.23) - evaluation, diagnosis, and Rx by the [facility] (their notes scanned into the EMR):  he had PSG 4/08 w/ AHI= 26, desat to 83%... placed on CPAP at 11cm H20 pressure... he also has some RLS & was on Requip transiently from the [place]. ~  4/11: pt indicates that he uses his CPAP 4/7 nights for 6-7 hr/night; he feels that he rests well, denies daytime hypersom, snoring, etc... he requests Sleep consult here in  [place] (see DrClance consult).  ALLERGIC RHINITIS (ICD-477.9) - controlled on Claritin, and OTC meds...  HYPERTENSION (ICD-401.9) - on ASA 81mg /d & HCTZ 25mg /d. ~  CXR 4/11 is clear and WNL, NAD...  Hx of SYNCOPE (ICD-780.2) & CARDIAC PACEMAKER IN SITU (ICD-V45.01) - he had pacer changed 11/08 DrKlein (hosp DC Summary reviewed)... he continues regular pacer checks and f/u w/ DrKlein...  ~  cath 3/99 DrJoeLeB showed norm coronaries, norm LV, norm Ao... ~  He saw DrKlein 12/11 for f/u syncope secondary to sinus node dysfunction, treated w/ pacer & it was recently replaced;  He noted intercurrent syncope that was felt to be vasovagal in origin (this [facility] while waiting for his wife, & he has a long hx of this from blood draws over the yrs) & DrKlein reprogramed his device; EKG w/ SBrady & otherw WNL...  DIABETES MELLITUS, BORDERLINE (ICD-790.29) - he's been concerned about his BS due to [facility] and his neuropathy symptoms, but his BS has only been borderline elvated prev...  ~  labs 4/10 (wt=209#) showed BS= 113, A1c= 6.3.Marland Kitchen. rec> better diet, get wt down. ~  labs 4/11 (wt=222#) showed BS= 109, A1c= 6.2.Marland Kitchen. no meds yet, just needs to get wt down. ~  Labs 6/12 (wt=204#) showed BS= 102, A1v=6.4  OVERWEIGHT (ICD-278.02) - we discussed diet + exercise therapy (again)... ~  weight 4/09 = 226#.Marland KitchenMarland Kitchen 5" 9" Tall for a BMI= 33-34 ~  weight 4/10 = 209#... BMI down to 31... keep up the good work. ~  weight 4/11 = 222# .Marland KitchenMarland Kitchen BMI up to 33. ~  Weight 6/12 = 204#  GASTROESOPHAGEAL REFLUX DISEASE (ICD-530.81) w/ BARRETT'S ESOPH - followed by DrWeissman & Rx w/ OMEPRAZOLE 20mg Bid... ~  prev EGD's documented a 5cmHH & Barrett's esoph... (last 1/03 by DrPatterson). ~  last EGD was 2/08 showing Barrett's epithelium in lower 1/3 of esoph... f/u planned 78yrs. ~  4/11:  NOTE> f/u EGD is due, he will decide on gastroenterologist ==> he saw DrMJohnson w/ neg EGD 5/11...  DIVERTICULOSIS OF COLON  (ICD-562.10) -  ~  last colonoscopy 2/08 by DrWeissman showed left sided divertics, otherw neg... f/u planned 66yrs...  IRRITABLE BOWEL SYNDROME (ICD-564.1)  BENIGN PROSTATIC HYPERTROPHY, WITH OBSTRUCTION (ICD-600.01) - on FLOMAX 0.4mg /d & Saw Palmetto... eval and Rx by [facility] who checks pt q66mo (we don't have notes from him)... pt tells me he will be seeing DrDavis regularly when he moves to [facility]  OSTEOARTHRITIS (ICD-715.90) > as above- now managed by DrAnderson & on Humira Rx trial... BACK PAIN (ICD-724.5) - pt saw DrBeane 6/10 for eval> neuropathy symptoms & lumbar spondylosis on XRays, Rx MOBIC 7.5mg  Prn... offered Myelogram for further eval... he also takes Calcium & Vit D.  PERIPHERAL NEUROPATHY (ICD-356.9) -  DrReynolds evaluated him w/ NCV's etc and dx a peripheral neuropathy... this is one of his CC> numbness, decr sensation in feet, (no pain) w/ some assoc balance problems intermittently...  he tried Mentanx but no benefit...  ~  4/11: I reviewed DrReynolds 11/10 note w/ the patient... ~  He also had a thorough neurologic evaluation 10/11 from one of his church physicians, DrHickling, for f/u polyneuropathy> balance issues, LBP, gait abn, variable paresthesias, foot numbness (sl decr vibration & pin prick), some subjective memory problems w/ norm MMSE, abnormal NCVs;  Prev Labs were all WNL, unrevealing;  Etiology of his neuropathy is unknown= cryptogenic, and since it isn't painful he really didn't require any therapy...  Health Maintenance - he takes a number of Vits + lecithin, saw palmetto, Fish Oil, etc... he saw DrDJones in 2010 for Derm review- pt reports nothing major found...   Past Surgical History  Procedure Date  . Appendectomy   . Cholecystectomy   . Tonsillectomy   . Pacemaker placement     Outpatient Encounter Prescriptions as of 08/19/2011  Medication Sig Dispense Refill  . aspirin 81 MG tablet Take 81 mg by mouth daily.        . calcium carbonate (OS-CAL) 600 MG  TABS Take 600 mg by mouth daily.        . Cholecalciferol (VITAMIN D) 1000 UNITS capsule Take 1,000 Units by mouth daily.        . finasteride (PROSCAR) 5 MG tablet Take 1 tablet by mouth daily.      . hydrochlorothiazide 25 MG tablet Take 1 tablet once a day  90 tablet  3  . Lecithin 1200 MG CAPS Take 1 capsule by mouth daily.        Marland Kitchen loratadine (CLARITIN) 10 MG tablet Take 10 mg by mouth daily.        . Multiple Vitamin (MULTIVITAMIN) capsule Take 1 capsule by mouth daily.        . Naproxen Sodium (ALEVE) 220 MG CAPS Take 1 capsule by mouth daily.        Marland Kitchen nystatin-triamcinolone (MYCOLOG II) cream as needed.      . Omega-3 Fatty Acids (FISH OIL) 500 MG CAPS Take 1 capsule by mouth daily.        Marland Kitchen omeprazole (PRILOSEC) 20 MG capsule Take 1 capsule (20 mg total) by mouth daily.  90 capsule  1  . saw palmetto 160 MG capsule Take 160 mg by mouth daily.        . Tamsulosin HCl (FLOMAX) 0.4 MG CAPS Take 0.4 mg by mouth daily.        . traMADol (ULTRAM) 50 MG tablet Take 50 mg by mouth 2 (two) times daily as needed.          No Known Allergies   Current Medications, Allergies, Past Medical History, Past Surgical History, Family History, and Social History were reviewed in [employer] record.   Review of Systems         Constitutional:   No  weight loss, night sweats,  Fevers, chills, + fatigue, or  lassitude.  HEENT:   No headaches,  Difficulty swallowing,  Tooth/dental problems, or  Sore throat,                No sneezing, itching, ear ache, nasal congestion, post nasal drip,   CV:  No chest pain,  Orthopnea, PND, swelling in lower extremities, anasarca, dizziness, palpitations, syncope.   GI  No heartburn, indigestion,  abdominal pain, nausea, vomiting, diarrhea, change in bowel habits, loss of appetite, bloody stools.   Resp: No shortness of breath with exertion or at rest.  No excess mucus, no productive cough,  No non-productive cough,  No coughing up of  blood.  No change in color of mucus.  No wheezing.  No chest wall deformity  Skin: no rash or lesions.  GU: no dysuria, change in color of urine, no urgency or frequency.  No flank pain, no hematuria   MS:  No joint pain or swelling.  No decreased range of motion.  No back pain.  Psych:  No change in mood or affect. No depression or anxiety.  No memory loss.        Objective:   Physical Exam      WD, WN, 78 y/o WM in NAD... GENERAL:  Alert & oriented; pleasant & cooperative... HEENT:  [place]/AT, EOM-wnl, PERRLA, EACs-clear, TMs-wnl, NOSE-clear, THROAT-clear & wnl. NECK:  Supple w/ fairROM; no JVD; normal carotid impulses w/o bruits; no thyromegaly or nodules palpated; no lymphadenopathy. CHEST:  Clear to P & A; without wheezes/ rales/ or rhonchi. HEART:  Regular Rhythm; without murmurs/ rubs/ or gallops; pacer in left shoulder area... ABDOMEN:  Soft & nontender; normal bowel sounds; no organomegaly or masses detected. EXT: without deformities, mild arthritic changes; no varicose veins/ +venous insuffic/ no edema. NEURO:  CN's intact;  nml  gait;   DERM:  No lesions noted; no rash etc...   Assessment & Plan:

## 2011-08-19 NOTE — Patient Instructions (Signed)
Check blood pressure 3-4 times a week, keep log.  Call for b/p consistently  >140-150  Low salt diet  Weight loss.  Avoid decongestants, -sudafed meds  Decrease NSAIDS use- ie Aleve -use Tylenol instead.  Please contact office for sooner follow up if symptoms do not improve or worsen or seek emergency care

## 2011-10-03 ENCOUNTER — Encounter: Payer: Medicare Other | Admitting: *Deleted

## 2011-10-07 ENCOUNTER — Encounter: Payer: Self-pay | Admitting: *Deleted

## 2011-10-11 ENCOUNTER — Ambulatory Visit (INDEPENDENT_AMBULATORY_CARE_PROVIDER_SITE_OTHER): Payer: Medicare Other | Admitting: *Deleted

## 2011-10-11 ENCOUNTER — Encounter: Payer: Self-pay | Admitting: Internal Medicine

## 2011-10-11 DIAGNOSIS — I495 Sick sinus syndrome: Secondary | ICD-10-CM

## 2011-10-14 LAB — REMOTE PACEMAKER DEVICE
AL IMPEDENCE PM: 645 Ohm
AL THRESHOLD: 0.625 V
ATRIAL PACING PM: 0
RV LEAD IMPEDENCE PM: 757 Ohm
RV LEAD THRESHOLD: 0.875 V
VENTRICULAR PACING PM: 0

## 2011-10-17 NOTE — Progress Notes (Signed)
Remote pacer check  

## 2011-10-31 ENCOUNTER — Other Ambulatory Visit: Payer: Self-pay | Admitting: Pulmonary Disease

## 2011-11-01 ENCOUNTER — Encounter: Payer: Self-pay | Admitting: *Deleted

## 2011-12-19 ENCOUNTER — Ambulatory Visit: Payer: Medicare Other | Admitting: Pulmonary Disease

## 2011-12-24 ENCOUNTER — Encounter: Payer: Self-pay | Admitting: Pulmonary Disease

## 2011-12-24 ENCOUNTER — Ambulatory Visit (INDEPENDENT_AMBULATORY_CARE_PROVIDER_SITE_OTHER): Payer: Medicare Other | Admitting: Pulmonary Disease

## 2011-12-24 VITALS — BP 148/92 | HR 83 | Temp 97.8°F | Ht 68.0 in | Wt 214.4 lb

## 2011-12-24 DIAGNOSIS — G4733 Obstructive sleep apnea (adult) (pediatric): Secondary | ICD-10-CM

## 2011-12-24 NOTE — Assessment & Plan Note (Signed)
The patient is doing well with CPAP, and is wearing the device compliantly.  He is having no issues with his mask or machine, however does want to have his pressure checked to make sure the machine is functioning properly.  He is inquiring about a new machine, and is probably due for one.  I have asked him to stay on CPAP compliantly, and to work aggressively on weight loss.

## 2011-12-24 NOTE — Patient Instructions (Signed)
Will send an order to [facility] to have them check pressure output on your machine, and also show you how to use ramp. Check with [facility] to see if you are eligible for a new machine, and if so, let me know so I can order Work on weight loss followup with me in 1 year if doing well.

## 2011-12-24 NOTE — Progress Notes (Signed)
  Subjective:    Patient ID: Tony Cruz, male    DOB: Feb 01, 1934, 78 y.o.   MRN: [id number]  HPI The patient comes in today for followup of his known obstructive sleep apnea.  He is wearing CPAP compliantly, and feels that he sleeps well with the device.  He is also satisfied with his daytime alertness.  He is asking about the functioning of his machine, and whether the pressure may be adequate.  He does have an aged machine, and may be due for a replacement.  Of note, his weight has increased 10 pounds since last visit.   Review of Systems  Constitutional: Negative.  Negative for fever and unexpected weight change.  HENT: Negative.  Negative for ear pain, nosebleeds, congestion, sore throat, rhinorrhea, sneezing, trouble swallowing, dental problem, postnasal drip and sinus pressure.   Eyes: Negative.  Negative for redness and itching.  Respiratory: Negative.  Negative for cough, chest tightness, shortness of breath and wheezing.   Cardiovascular: Negative.  Negative for palpitations and leg swelling.  Gastrointestinal: Negative.  Negative for nausea and vomiting.  Genitourinary: Negative.  Negative for dysuria.  Musculoskeletal: Negative.  Negative for joint swelling.  Skin: Negative for rash.  Neurological: Negative.  Negative for headaches.  Hematological: Negative.  Does not bruise/bleed easily.  Psychiatric/Behavioral: Negative.  Negative for dysphoric mood. The patient is not nervous/anxious.        Objective:   Physical Exam Overweight male in no acute distress No skin breakdown or pressure necrosis from the CPAP mask Lower extremities without edema, no cyanosis Alert, does not appear to be sleepy, moves all 4 extremities.       Assessment & Plan:

## 2012-01-08 ENCOUNTER — Other Ambulatory Visit: Payer: Self-pay | Admitting: Pulmonary Disease

## 2012-01-16 ENCOUNTER — Encounter: Payer: Medicare Other | Admitting: *Deleted

## 2012-01-24 ENCOUNTER — Encounter: Payer: Self-pay | Admitting: Pulmonary Disease

## 2012-01-24 ENCOUNTER — Ambulatory Visit (INDEPENDENT_AMBULATORY_CARE_PROVIDER_SITE_OTHER): Payer: Medicare Other | Admitting: Pulmonary Disease

## 2012-01-24 ENCOUNTER — Other Ambulatory Visit (INDEPENDENT_AMBULATORY_CARE_PROVIDER_SITE_OTHER): Payer: Medicare Other

## 2012-01-24 VITALS — BP 142/98 | HR 68 | Temp 96.9°F | Ht 69.0 in | Wt 213.6 lb

## 2012-01-24 DIAGNOSIS — R55 Syncope and collapse: Secondary | ICD-10-CM

## 2012-01-24 DIAGNOSIS — G4733 Obstructive sleep apnea (adult) (pediatric): Secondary | ICD-10-CM

## 2012-01-24 DIAGNOSIS — E7800 Pure hypercholesterolemia, unspecified: Secondary | ICD-10-CM

## 2012-01-24 DIAGNOSIS — I10 Essential (primary) hypertension: Secondary | ICD-10-CM

## 2012-01-24 DIAGNOSIS — F419 Anxiety disorder, unspecified: Secondary | ICD-10-CM

## 2012-01-24 DIAGNOSIS — N138 Other obstructive and reflux uropathy: Secondary | ICD-10-CM

## 2012-01-24 DIAGNOSIS — K219 Gastro-esophageal reflux disease without esophagitis: Secondary | ICD-10-CM

## 2012-01-24 DIAGNOSIS — K5730 Diverticulosis of large intestine without perforation or abscess without bleeding: Secondary | ICD-10-CM

## 2012-01-24 DIAGNOSIS — M1990 Unspecified osteoarthritis, unspecified site: Secondary | ICD-10-CM

## 2012-01-24 DIAGNOSIS — G609 Hereditary and idiopathic neuropathy, unspecified: Secondary | ICD-10-CM

## 2012-01-24 DIAGNOSIS — R7309 Other abnormal glucose: Secondary | ICD-10-CM

## 2012-01-24 DIAGNOSIS — E663 Overweight: Secondary | ICD-10-CM

## 2012-01-24 DIAGNOSIS — F411 Generalized anxiety disorder: Secondary | ICD-10-CM

## 2012-01-24 DIAGNOSIS — K589 Irritable bowel syndrome without diarrhea: Secondary | ICD-10-CM

## 2012-01-24 DIAGNOSIS — Z950 Presence of cardiac pacemaker: Secondary | ICD-10-CM

## 2012-01-24 DIAGNOSIS — N401 Enlarged prostate with lower urinary tract symptoms: Secondary | ICD-10-CM

## 2012-01-24 LAB — CBC WITH DIFFERENTIAL/PLATELET
Basophils Absolute: 0.1 10*3/uL (ref 0.0–0.1)
Eosinophils Absolute: 0.9 10*3/uL — ABNORMAL HIGH (ref 0.0–0.7)
Eosinophils Relative: 12.8 % — ABNORMAL HIGH (ref 0.0–5.0)
HCT: 40.1 % (ref 39.0–52.0)
Lymphs Abs: 2.3 10*3/uL (ref 0.7–4.0)
MCHC: 33.1 g/dL (ref 30.0–36.0)
MCV: 92.3 fl (ref 78.0–100.0)
Monocytes Absolute: 0.8 10*3/uL (ref 0.1–1.0)
Neutrophils Relative %: 40.3 % — ABNORMAL LOW (ref 43.0–77.0)
Platelets: 253 10*3/uL (ref 150.0–400.0)
RDW: 15.6 % — ABNORMAL HIGH (ref 11.5–14.6)
WBC: 6.7 10*3/uL (ref 4.5–10.5)

## 2012-01-24 LAB — LIPID PANEL
LDL Cholesterol: 99 mg/dL (ref 0–99)
Total CHOL/HDL Ratio: 3
Triglycerides: 78 mg/dL (ref 0.0–149.0)

## 2012-01-24 LAB — HEMOGLOBIN A1C: Hgb A1c MFr Bld: 6.4 % (ref 4.6–6.5)

## 2012-01-24 LAB — HEPATIC FUNCTION PANEL
ALT: 24 U/L (ref 0–53)
AST: 23 U/L (ref 0–37)
Alkaline Phosphatase: 53 U/L (ref 39–117)
Bilirubin, Direct: 0.1 mg/dL (ref 0.0–0.3)
Total Bilirubin: 0.8 mg/dL (ref 0.3–1.2)
Total Protein: 7.1 g/dL (ref 6.0–8.3)

## 2012-01-24 LAB — BASIC METABOLIC PANEL
CO2: 31 mEq/L (ref 19–32)
Chloride: 98 mEq/L (ref 96–112)
Creatinine, Ser: 0.9 mg/dL (ref 0.4–1.5)
Potassium: 4.4 mEq/L (ref 3.5–5.1)
Sodium: 135 mEq/L (ref 135–145)

## 2012-01-24 LAB — TSH: TSH: 1.11 u[IU]/mL (ref 0.35–5.50)

## 2012-01-24 MED ORDER — LOSARTAN POTASSIUM-HCTZ 100-12.5 MG PO TABS: 1.0000 | ORAL_TABLET | Freq: Every day | ORAL | Status: DC

## 2012-01-24 MED ORDER — OMEPRAZOLE 20 MG PO CPDR: 20.0000 mg | DELAYED_RELEASE_CAPSULE | Freq: Two times a day (BID) | ORAL | Status: DC

## 2012-01-24 NOTE — Progress Notes (Signed)
Subjective:    Patient ID: Tony Cruz, male    DOB: 10/24/1933, 78 y.o.   MRN: [id number]  HPI 78 y/o WM here for a follow up visit... he has multiple medical problems as noted below...   ~  January 24, 2011:  49mo ROV & he reports "another exciting year of retirement"> c/o neuropathy symptoms & arthritis esp in hands w/ recent eval by DrAnderson & on a trial of HUMIRA injections now (we don't have his note)...    He saw DrClance 4/11 for Sleep Med consult> hx mild OSA from sleep study at the [facility] ?AHI=9-21? & titrated to CPAP=11 & tol well;  He was asked to work on weight reduction...    He saw DrKlein 12/11 for f/u syncope secondary to sinus node dysfunction, treated w/ pacer & it was recently replaced;  He noted intercurrent syncope that was felt to be vasovagal in origin (this occurred in the Cards clinic 8/11 while waiting for his wife, & he has a long hx of this from blood draws over the yrs) & DrKlein reprogramed his device; EKG w/ SBrady & otherw WNL.Marland KitchenMarland Kitchen    He saw DrMJohnson for GI & had an EGD 5/11 which was essentially neg...    He also had a thorough neurologic evaluation 10/11 from one of his church physicians, DrHickling, for f/u polyneuropathy> balance issues, LBP, gait abn, variable paresthesias, foot numbness (sl decr vibration & pin prick), some subjective memory problems w/ norm MMSE, abnormal NCVs;  Prev Labs were all WNL, unrevealing;  etiology of his neuropathy is unknown= cryptogenic, and since it isn't painful he really didn't require any therapy...    LABS today showed FLP looking good on Fish Oil alone; Chems= wnl w/ BS=104 & A1c=6.4, CBC=norm, TSH wnl...  ~  January 24, 2012:  59yr ROV & Tony Cruz is reasonably stable "just the usual problems w/ aging" he says;  CC is arthritis pain "nothing helps" he says but using Tramadol prn; still c/o his neuropathy w/ balance issues etc but fortunately no pain from that; he still does yard work, Sales executive, Catering manager...    He saw DrKlein for  Cards/EP f/u 12/12> hx syncope related to SAnode dysfunction- treated w/ pacer & generator changed 2008; devise working properly & no changes made to the programming; TSH & sed rate wetre checked and WNL.Marland KitchenMarland Kitchen    He saw DrClance for Sleep f/u> noted 10# wt gain, pt reported using CPAP compliantly w/ good daytime alertness, no issues w/ mask but requesting new machine as his is >19yrs old; requested to work on wt reduction...    He has been followed by [facility] Pellant for Rheum> ?poss psoriatic arthritis variant w/ inflamm arthritis but nothing has really worked for him including trials of Pred, NSAIDs, MTX, anti-TNF inhibitor; last note dated 2/13 w/ trial of Celebrex but no better on that either...    We reviewed prob list, meds, xrays and labs> see below>> LABS 6/13:  FLP- at goals on diet alone;  Chems- ok w/ BS=105 A1c=6.4;  CBC- wnl x 13% eos;  TSH=1.11   Problem List:   OBSTRUCTIVE SLEEP APNEA (ICD-327.23) - evaluation, diagnosis, and Rx by the [facility] (their notes scanned into the EMR):  he had PSG 4/08 w/ AHI= 26, desat to 83%... placed on CPAP at 11cm H20 pressure... he also has some RLS & was on Requip transiently from the [place]. ~  4/11: pt indicates that he uses his CPAP 4/7 nights for 6-7 hr/night; he feels that  he rests well, denies daytime hypersom, snoring, etc... he requests Sleep consult here in [place] (see DrClance consult). ~  5/13: he saw DrClance for Sleep f/u> noted 10# wt gain, pt reported using CPAP compliantly w/ good daytime alertness, no issues w/ mask but requesting new machine ([facility]) as his is >37yrs old; requested to work on wt reduction...  ALLERGIC RHINITIS (ICD-477.9) - controlled on Claritin, and OTC meds...   HYPERTENSION (ICD-401.9) - on ASA 81mg /d & HCTZ 25mg /d...  ~  CXR 4/11 is clear and WNL, NAD... ~  6/12:  BP=122/82 today and he denies HA, fatigue, visual changes, CP, palipit, dizziness, syncope, dyspnea, edema, etc... ~  6/13:  BP= 142/98 & he admits intermittently elev  at home; we discussed change med to LOSARTAN/ HCT 100/12.5 daily; he will monitor BP & f/u here...  Hx of SYNCOPE (ICD-780.2) & CARDIAC PACEMAKER IN SITU (ICD-V45.01) - he had pacer changed 11/08 DrKlein (hosp DC Summary reviewed)... he continues regular pacer checks and f/u w/ DrKlein...  ~  cath 3/99 DrJoeLeB showed norm coronaries, norm LV, norm Ao... ~  He saw DrKlein 12/11 for f/u syncope secondary to sinus node dysfunction, treated w/ pacer & generator replaced 2008;  He noted intercurrent syncope that was felt to be vasovagal in origin (this occurred in the [facility] clinic 8/11 while waiting for his wife, & he has a long hx of this from blood draws over the yrs) & DrKlein reprogramed his device; EKG w/ SBrady & otherw WNL... ~  He saw DrKlein for Cards/EP f/u 12/12> hx syncope related to [facility] dysfunction- devise working properly & no changes made to the programming; TSH & sed rate wetre checked and WNL...  DIABETES MELLITUS, BORDERLINE (ICD-790.29) - he's been concerned about his BS due to [facility] and his neuropathy symptoms, but his BS has only been borderline elvated prev...  ~  labs 4/10 (wt=209#) showed BS= 113, A1c= 6.3.Marland Kitchen. rec> better diet, get wt down. ~  labs 4/11 (wt=222#) showed BS= 109, A1c= 6.2.Marland Kitchen. no meds yet, just needs to get wt down. ~  Labs 6/12 (wt=204#) showed BS= 102, A1v= 6.4 ~  Labs 6/13 (wt=213#) showed BS= 105, A1c= 6.4  OVERWEIGHT (ICD-278.02) - we discussed diet + exercise therapy (again)... ~  weight 4/09 = 226#.Marland KitchenMarland Kitchen 5" 9" Tall for a BMI= 33-34 ~  weight 4/10 = 209#... BMI down to 31... keep up the good work. ~  weight 4/11 = 222# .Marland KitchenMarland Kitchen BMI up to 33. ~  Weight 6/12 = 204# ~  Weight 6/13 = 213#  GASTROESOPHAGEAL REFLUX DISEASE (ICD-530.81) w/ BARRETT'S ESOPH - followed by DrWeissman & Rx w/ OMEPRAZOLE 20mg Bid... ~  prev EGD's documented a 5cmHH & Barrett's esoph... (last 1/03 by DrPatterson). ~  last EGD was 2/08 showing Barrett's epithelium in lower 1/3 of esoph... f/u  planned 20yrs. ~  4/11:  NOTE> f/u EGD is due, he will decide on gastroenterologist ==> he saw DrMJohnson w/ EGD 5/11 showing Irreg Z-line & bx c/w Barrett's esoph (no dysplasia), the rest of the esoph, stomach, & duodenum were WNL...  DIVERTICULOSIS OF COLON (ICD-562.10) -  ~  last colonoscopy 2/08 by DrWeissman showed left sided divertics, otherw neg... f/u planned 18yrs...  IRRITABLE BOWEL SYNDROME (ICD-564.1)  BENIGN PROSTATIC HYPERTROPHY, WITH OBSTRUCTION (ICD-600.01) - on FLOMAX 0.4mg /d & PROSCAR 5mg /d... eval and Rx by [facility] who checks pt q65mo (we don't have notes from him)... pt tells me he will be seeing DrDavis regularly when he moves to [facility]. ~  6/13:  pt reports that he is now seeing DrEskridge at [facility] & his PSAs have all been normal; his major prob has been holding his urine 7 eval revealed not emptying completelt- they have tried several meds to help this; he will have notes sent to [place] for [facility] records.  OSTEOARTHRITIS (ICD-715.90) > as above- now managed by [facility] & difficult problem, even Humira trial w/o benefit. BACK PAIN (ICD-724.5) - pt saw DrBeane 6/10 for eval> neuropathy symptoms & lumbar spondylosis on XRays, Rx Mobic Prn... offered Myelogram for further eval... he also takes Calcium & Vit D.  PERIPHERAL NEUROPATHY (ICD-356.9) - DrReynolds evaluated him w/ NCV's etc and dx a peripheral neuropathy... this is one of his CC> numbness, decr sensation in feet, (no pain) w/ some assoc balance problems intermittently...  he tried Mentanx but no benefit...  ~  4/11: I reviewed DrReynolds 11/10 note w/ the patient... ~  He also had a thorough neurologic evaluation 10/11 from one of his church physicians, DrHickling, for f/u polyneuropathy> balance issues, LBP, gait abn, variable paresthesias, foot numbness (sl decr vibration & pin prick), some subjective memory problems w/ norm MMSE, abnormal NCVs;  Prev Labs were all WNL, unrevealing;  etiology of his neuropathy is  unknown= cryptogenic, and since it isn't painful he really didn't require any therapy... ~  6/13: he continues to experience prob w/ balance etc related to his neuropathy; not progressive & still not painful; also notes nocturnal leg cramps & he's tried bar of soap trick, mustard, etc- all w/o relief (we rec trial Tonic water)...  Health Maintenance - he takes a number of Vits + lecithin, saw palmetto, Fish Oil, etc... he saw DrDJones in 2010 for Derm review- pt reports nothing major found...   Past Surgical History  Procedure Date  . Appendectomy   . Cholecystectomy   . Tonsillectomy   . Pacemaker placement     Outpatient Encounter Prescriptions as of 01/24/2012  Medication Sig Dispense Refill  . aspirin 81 MG tablet Take 81 mg by mouth daily.        . calcium carbonate (OS-CAL) 600 MG TABS Take 600 mg by mouth daily.        . Cholecalciferol (VITAMIN D) 1000 UNITS capsule Take 1,000 Units by mouth daily.        . finasteride (PROSCAR) 5 MG tablet Take 1 tablet by mouth daily.      . hydrochlorothiazide (HYDRODIURIL) 25 MG tablet TAKE 1 TABLET ONCE DAILY  90 tablet  0  . Lecithin 1200 MG CAPS Take 1 capsule by mouth daily.        Marland Kitchen loratadine (CLARITIN) 10 MG tablet Take 10 mg by mouth daily.        . Multiple Vitamin (MULTIVITAMIN) capsule Take 1 capsule by mouth daily.        . Naproxen Sodium (ALEVE) 220 MG CAPS Take 1 capsule by mouth daily.        Marland Kitchen nystatin-triamcinolone (MYCOLOG II) cream as needed.      . Omega-3 Fatty Acids (FISH OIL) 500 MG CAPS Take 1 capsule by mouth daily.        Marland Kitchen omeprazole (PRILOSEC) 20 MG capsule TAKE 1 CAPSULE TWICE DAILY  180 capsule  0  . Tamsulosin HCl (FLOMAX) 0.4 MG CAPS Take 0.4 mg by mouth daily.        . traMADol (ULTRAM) 50 MG tablet Take 50 mg by mouth 2 (two) times daily as needed.  No Known Allergies   Current Medications, Allergies, Past Medical History, Past Surgical History, Family History, and Social History were reviewed  in [employer] record.   Review of Systems        The patient complains of sleep disorder, dyspnea on exertion, gas/bloating, joint pain, stiffness, arthritis, paresthesias, and difficulty walking.  The patient denies fever, chills, sweats, anorexia, fatigue, weakness, malaise, weight loss, blurring, diplopia, eye irritation, eye discharge, vision loss, eye pain, photophobia, earache, ear discharge, tinnitus, decreased hearing, nasal congestion, nosebleeds, sore throat, hoarseness, chest pain, palpitations, syncope, orthopnea, PND, peripheral edema, cough, dyspnea at rest, excessive sputum, hemoptysis, wheezing, pleurisy, nausea, vomiting, diarrhea, constipation, change in bowel habits, abdominal pain, melena, hematochezia, jaundice, indigestion/heartburn, dysphagia, odynophagia, dysuria, hematuria, urinary frequency, urinary hesitancy, nocturia, incontinence, back pain, joint swelling, muscle cramps, muscle weakness, sciatica, restless legs, leg pain at night, leg pain with exertion, rash, itching, dryness, suspicious lesions, paralysis, seizures, tremors, vertigo, transient blindness, frequent falls, frequent headaches, depression, anxiety, memory loss, confusion, cold intolerance, heat intolerance, polydipsia, polyphagia, polyuria, unusual weight change, abnormal bruising, bleeding, enlarged lymph nodes, urticaria, allergic rash, hay fever, and recurrent infections.     Objective:   Physical Exam     WD, WN, 78 y/o WM in NAD... GENERAL:  Alert & oriented; pleasant & cooperative... HEENT:  [place]/AT, EOM-wnl, PERRLA, EACs-clear, TMs-wnl, NOSE-clear, THROAT-clear & wnl. NECK:  Supple w/ fairROM; no JVD; normal carotid impulses w/o bruits; no thyromegaly or nodules palpated; no lymphadenopathy. CHEST:  Clear to P & A; without wheezes/ rales/ or rhonchi. HEART:  Regular Rhythm; without murmurs/ rubs/ or gallops; pacer in left shoulder area... ABDOMEN:  Soft & nontender; normal  bowel sounds; no organomegaly or masses detected. EXT: without deformities, mild arthritic changes; no varicose veins/ +venous insuffic/ no edema. NEURO:  CN's intact;  fair gait;  Motor normal, sensory variable deficits... strength seems OK, min decr sensation in LEs. DERM:  No lesions noted; no rash etc...  RADIOLOGY DATA:  Reviewed in the EPIC EMR & discussed w/ the patient...  LABORATORY DATA:  Reviewed in the EPIC EMR & discussed w/ the patient...   Assessment & Plan:   OSA>  Followed by DrClance & stable on CPAP; we will try to get him a new machine thru [facility]...  HBP>  His BP is sl elev on diuretic alone 7 we have rec change to HYZAAR 100-12.5; he will monitor BP at home & f/u in office...  Hx Syncope/ Pacer>  Followed by DrKlein & pacer functioning normally...  DM, borderline>  On diet alone & managing well, A1c remains 6.4 & we reviewed the import of wt reduction...  GERD, Hx Barrett's>  S/p EGD from DrJohnson last yr & small segment Barrett's mucosa, no dysplasia...  Divertics, IBS>  He won't be due for f/u colon until 2018 per DrJohnson...  BPH>  Followed by DrEskridge at [facility]; biggest issue is control & he says it was from not emptying completely, they have tried several meds and continue to follow up regularly, he reports PSAs have been ok; he will get notes to [place]...  DJD>  Followed by [facility] & still having difficulty w/ pain & stiffness...  Periph Neuropathy>  Eval by Neuro, cryptogenic, no meds needed & symptoms remain the same...   Patient's Medications  New Prescriptions   LOSARTAN-HYDROCHLOROTHIAZIDE (HYZAAR) 100-12.5 MG PER TABLET    Take 1 tablet by mouth daily.  Previous Medications   ASPIRIN 81 MG TABLET    Take  81 mg by mouth daily.     CALCIUM CARBONATE (OS-CAL) 600 MG TABS    Take 600 mg by mouth daily.     CHOLECALCIFEROL (VITAMIN D) 1000 UNITS CAPSULE    Take 1,000 Units by mouth daily.     FINASTERIDE (PROSCAR) 5 MG TABLET    Take 1  tablet by mouth daily.   LECITHIN 1200 MG CAPS    Take 1 capsule by mouth daily.     LORATADINE (CLARITIN) 10 MG TABLET    Take 10 mg by mouth daily.     MULTIPLE VITAMIN (MULTIVITAMIN) CAPSULE    Take 1 capsule by mouth daily.     NAPROXEN SODIUM (ALEVE) 220 MG CAPS    Take 1 capsule by mouth daily.     NYSTATIN-TRIAMCINOLONE (MYCOLOG II) CREAM    as needed.   OMEGA-3 FATTY ACIDS (FISH OIL) 500 MG CAPS    Take 1 capsule by mouth daily.     TAMSULOSIN HCL (FLOMAX) 0.4 MG CAPS    Take 0.4 mg by mouth daily.     TRAMADOL (ULTRAM) 50 MG TABLET    Take 50 mg by mouth 2 (two) times daily as needed.    Modified Medications   Modified Medication Previous Medication   OMEPRAZOLE (PRILOSEC) 20 MG CAPSULE omeprazole (PRILOSEC) 20 MG capsule      Take 1 capsule (20 mg total) by mouth 2 (two) times daily.    TAKE 1 CAPSULE TWICE DAILY  Discontinued Medications   HYDROCHLOROTHIAZIDE (HYDRODIURIL) 25 MG TABLET    TAKE 1 TABLET ONCE DAILY

## 2012-01-24 NOTE — Patient Instructions (Addendum)
Today we updated your med list in our EPIC system...    We decided to change your BP med from plain HCT to LOSARTAN/ Hct- take one tab daily in AM...  Monitor your BP at home & call me in a month or so w/ an update on your home BP checks on the new med...  Today we did your follow up FASTING blood work...    We will call you w/ the report when avail...  Ask DrEskridge to send me a copy of your PSA & Urology eval... Thanks.  We will send a referral to [facility] for a new CPAP machine as you discussed w/ drclance...  Call for any problems or if we can be of service in any way...  Let's continue our 6-12 month follow up visits at your discretion.Marland KitchenMarland Kitchen

## 2012-01-27 ENCOUNTER — Encounter: Payer: Self-pay | Admitting: *Deleted

## 2012-02-10 ENCOUNTER — Encounter: Payer: Self-pay | Admitting: Internal Medicine

## 2012-02-10 ENCOUNTER — Ambulatory Visit (INDEPENDENT_AMBULATORY_CARE_PROVIDER_SITE_OTHER): Payer: Medicare Other | Admitting: *Deleted

## 2012-02-10 DIAGNOSIS — I495 Sick sinus syndrome: Secondary | ICD-10-CM

## 2012-02-14 LAB — REMOTE PACEMAKER DEVICE
AL AMPLITUDE: 2.8 mv
AL IMPEDENCE PM: 656 Ohm
ATRIAL PACING PM: 0
BATTERY VOLTAGE: 2.76 V
RV LEAD IMPEDENCE PM: 798 Ohm
RV LEAD THRESHOLD: 0.875 V
VENTRICULAR PACING PM: 0

## 2012-03-12 ENCOUNTER — Encounter: Payer: Self-pay | Admitting: *Deleted

## 2012-04-16 ENCOUNTER — Other Ambulatory Visit: Payer: Self-pay | Admitting: Pulmonary Disease

## 2012-05-18 ENCOUNTER — Other Ambulatory Visit: Payer: Self-pay | Admitting: Pulmonary Disease

## 2012-05-18 ENCOUNTER — Encounter: Payer: Medicare Other | Admitting: *Deleted

## 2012-05-18 DIAGNOSIS — G4733 Obstructive sleep apnea (adult) (pediatric): Secondary | ICD-10-CM

## 2012-05-18 DIAGNOSIS — G609 Hereditary and idiopathic neuropathy, unspecified: Secondary | ICD-10-CM

## 2012-05-22 ENCOUNTER — Encounter: Payer: Self-pay | Admitting: *Deleted

## 2012-06-03 ENCOUNTER — Encounter: Payer: Self-pay | Admitting: *Deleted

## 2012-06-04 ENCOUNTER — Encounter: Payer: Self-pay | Admitting: Pulmonary Disease

## 2012-06-04 ENCOUNTER — Ambulatory Visit (INDEPENDENT_AMBULATORY_CARE_PROVIDER_SITE_OTHER): Payer: Medicare Other | Admitting: Pulmonary Disease

## 2012-06-04 VITALS — BP 132/88 | HR 73 | Temp 97.0°F | Ht 69.0 in | Wt 221.8 lb

## 2012-06-04 DIAGNOSIS — N401 Enlarged prostate with lower urinary tract symptoms: Secondary | ICD-10-CM

## 2012-06-04 DIAGNOSIS — K589 Irritable bowel syndrome without diarrhea: Secondary | ICD-10-CM

## 2012-06-04 DIAGNOSIS — I10 Essential (primary) hypertension: Secondary | ICD-10-CM

## 2012-06-04 DIAGNOSIS — Z950 Presence of cardiac pacemaker: Secondary | ICD-10-CM

## 2012-06-04 DIAGNOSIS — G609 Hereditary and idiopathic neuropathy, unspecified: Secondary | ICD-10-CM

## 2012-06-04 DIAGNOSIS — G4733 Obstructive sleep apnea (adult) (pediatric): Secondary | ICD-10-CM

## 2012-06-04 DIAGNOSIS — K5730 Diverticulosis of large intestine without perforation or abscess without bleeding: Secondary | ICD-10-CM

## 2012-06-04 DIAGNOSIS — E663 Overweight: Secondary | ICD-10-CM

## 2012-06-04 DIAGNOSIS — Z23 Encounter for immunization: Secondary | ICD-10-CM

## 2012-06-04 DIAGNOSIS — K219 Gastro-esophageal reflux disease without esophagitis: Secondary | ICD-10-CM

## 2012-06-04 DIAGNOSIS — R55 Syncope and collapse: Secondary | ICD-10-CM

## 2012-06-04 DIAGNOSIS — N138 Other obstructive and reflux uropathy: Secondary | ICD-10-CM

## 2012-06-04 DIAGNOSIS — M1990 Unspecified osteoarthritis, unspecified site: Secondary | ICD-10-CM

## 2012-06-04 NOTE — Progress Notes (Signed)
Subjective:    Patient ID: Tony Cruz, male    DOB: 06/16/1934, 78 y.o.   MRN: [id number]  HPI 78 y/o WM here for a follow up visit... he has multiple medical problems as noted below...   ~  January 24, 2011:  1mo ROV & he reports "another exciting year of retirement"> c/o neuropathy symptoms & arthritis esp in hands w/ recent eval by DrAnderson & on a trial of HUMIRA injections now (we don't have his note)...    He saw DrClance 4/11 for Sleep Med consult> hx mild OSA from sleep study at the [facility] ?AHI=9-21? & titrated to CPAP=11 & tol well;  He was asked to work on weight reduction...    He saw DrKlein 12/11 for f/u syncope secondary to sinus node dysfunction, treated w/ pacer & it was recently replaced;  He noted intercurrent syncope that was felt to be vasovagal in origin (this occurred in the [facility] clinic 8/11 while waiting for his wife, & he has a long hx of this from blood draws over the yrs) & DrKlein reprogramed his device; EKG w/ SBrady & otherw WNL.Marland KitchenMarland Kitchen    He saw DrMJohnson for GI & had an EGD 5/11 which was essentially neg...    He also had a thorough neurologic evaluation 10/11 from one of his church physicians, DrHickling, for f/u polyneuropathy> balance issues, LBP, gait abn, variable paresthesias, foot numbness (sl decr vibration & pin prick), some subjective memory problems w/ norm MMSE, abnormal NCVs;  Prev Labs were all WNL, unrevealing;  etiology of his neuropathy is unknown= cryptogenic, and since it isn't painful he really didn't require any therapy...    LABS today showed FLP looking good on Fish Oil alone; Chems= wnl w/ BS=104 & A1c=6.4, CBC=norm, TSH wnl...  ~  January 24, 2012:  23yr ROV & Tony Cruz is reasonably stable "just the usual problems w/ aging" he says;  CC is arthritis pain "nothing helps" he says but using Tramadol prn; still c/o his neuropathy w/ balance issues etc but fortunately no pain from that; he still does yard work, Sales executive, Catering manager...    He saw DrKlein for  Cards/EP f/u 12/12> hx syncope related to SAnode dysfunction- treated w/ pacer & generator changed 2008; devise working properly & no changes made to the programming; TSH & sed rate wetre checked and WNL.Marland KitchenMarland Kitchen    He saw DrClance for Sleep f/u> noted 10# wt gain, pt reported using CPAP compliantly w/ good daytime alertness, no issues w/ mask but requesting new machine as his is >55yrs old; requested to work on wt reduction...    He has been followed by [facility] Pellant for Rheum> ?poss psoriatic arthritis variant w/ inflamm arthritis but nothing has really worked for him including trials of Pred, NSAIDs, MTX, anti-TNF inhibitor; last note dated 2/13 w/ trial of Celebrex but no better on that either...    We reviewed prob list, meds, xrays and labs> see below>> LABS 6/13:  FLP- at goals on diet alone;  Chems- ok w/ BS=105 A1c=6.4;  CBC- wnl x 13% eos;  TSH=1.11  ~  June 04, 2012:  28mo ROV & Tony Cruz needs a face-to-face visit for a new CPAP machine & supplies; he was applying thru the [facility], has been using [facility] & received a new machine 9/13- we requested download w/ compliance report dates 10/7- 06/02/12 showing 100% compliance, >4H on 18d w/ median usage ~5H/night; CPAP set at 11 w/ AHI<1 on his machine... Note> similar number provided for the month  before as well...    He is also c/o LBP and is requesting referral for PT> he has been seeing his chiropractor & notes that the adjustments are of little help; he started working w/ a physical therapist- doing McKenzie exercises & wants referral for this purpose...    He also notes slow progression of his periph neuropathy symptoms w/ decr sensation & balance is poor; he was concerned that Neuro has signed off & I offered him a second opinion consult if he requests...    We reviewed his mult somatic complaints as well- stomach rolling, had f/u DrEskridge 9/13- reviewed, etc... We reviewed prob list, meds, xrays and labs> see below for updates >> OK Flu shot & TDAP  today...   Problem List:   OBSTRUCTIVE SLEEP APNEA (ICD-327.23) - evaluation, diagnosis, and Rx by the [facility] (their notes scanned into the EMR):  he had PSG 4/08 w/ AHI= 26, desat to 83%... placed on CPAP at 11cm H20 pressure... he also has some RLS & was on Requip transiently from the [place]. ~  4/11: pt indicates that he uses his CPAP 4/7 nights for 6-7 hr/night; he feels that he rests well, denies daytime hypersom, snoring, etc... he requests Sleep consult here in [place] (see DrClance consult). ~  5/13: he saw DrClance for Sleep f/u> noted 10# wt gain, pt reported using CPAP compliantly w/ good daytime alertness, no issues w/ mask but requesting new machine ([facility]) as his is >6yrs old; requested to work on wt reduction...  ALLERGIC RHINITIS (ICD-477.9) - controlled on Claritin, and OTC meds...   HYPERTENSION (ICD-401.9) - on ASA 81mg /d & HCTZ 25mg /d...  ~  CXR 4/11 is clear and WNL, NAD... ~  6/12:  BP=122/82 today and he denies HA, fatigue, visual changes, CP, palipit, dizziness, syncope, dyspnea, edema, etc... ~  6/13:  BP= 142/98 & he admits intermittently elev at home; we discussed change med to LOSARTAN/ HCT 100/12.5 daily; he will monitor BP & f/u here... ~  11/13:  BP= 132/88 & similar at home; denies CP, palpit, SOB, edema...  Hx of SYNCOPE (ICD-780.2) & CARDIAC PACEMAKER IN SITU (ICD-V45.01) - he had pacer changed 11/08 DrKlein (hosp DC Summary reviewed)... he continues regular pacer checks and f/u w/ DrKlein...  ~  cath 3/99 DrJoeLeB showed norm coronaries, norm LV, norm Ao... ~  He saw DrKlein 12/11 for f/u syncope secondary to sinus node dysfunction, treated w/ pacer & generator replaced 2008;  He noted intercurrent syncope that was felt to be vasovagal in origin (this occurred in [facility] 8[facility] while waiting for his wife, & he has a long hx of this from blood draws over the yrs) & DrKlein reprogramed his device; EKG w/ SBrady & otherw WNL... ~  He saw DrKlein for Cards/EP f/u  12/12> hx syncope related to [facility] dysfunction- devise working properly & no changes made to the programming; TSH & sed rate wetre checked and WNL...  DIABETES MELLITUS, BORDERLINE (ICD-790.29) - he's been concerned about his BS due to [facility] and his neuropathy symptoms, but his BS has only been borderline elvated prev...  ~  labs 4/10 (wt=209#) showed BS= 113, A1c= 6.3.Marland Kitchen. rec> better diet, get wt down. ~  labs 4/11 (wt=222#) showed BS= 109, A1c= 6.2.Marland Kitchen. no meds yet, just needs to get wt down. ~  Labs 6/12 (wt=204#) showed BS= 102, A1v= 6.4 ~  Labs 6/13 (wt=213#) showed BS= 105, A1c= 6.4  OVERWEIGHT (ICD-278.02) - we discussed diet + exercise therapy (again)... ~  weight 4/09 = 226#.Marland KitchenMarland Kitchen 5" 9" Tall for a BMI= 33-34 ~  weight 4/10 = 209#... BMI down to 31... keep up the good work. ~  weight 4/11 = 222# .Marland KitchenMarland Kitchen BMI up to 33. ~  Weight 6/12 = 204# ~  Weight 6/13 = 213#  GASTROESOPHAGEAL REFLUX DISEASE (ICD-530.81) w/ BARRETT'S ESOPH - followed by DrWeissman & Rx w/ OMEPRAZOLE 20mg Bid... ~  prev EGD's documented a 5cmHH & Barrett's esoph... (last 1/03 by DrPatterson). ~  last EGD was 2/08 showing Barrett's epithelium in lower 1/3 of esoph... f/u planned 80yrs. ~  4/11:  NOTE> f/u EGD is due, he will decide on gastroenterologist ==> he saw DrMJohnson w/ EGD 5/11 showing Irreg Z-line & bx c/w Barrett's esoph (no dysplasia), the rest of the esoph, stomach, & duodenum were WNL...  DIVERTICULOSIS OF COLON (ICD-562.10) -  ~  last colonoscopy 2/08 by DrWeissman showed left sided divertics, otherw neg... f/u planned 33yrs...  IRRITABLE BOWEL SYNDROME (ICD-564.1)  BENIGN PROSTATIC HYPERTROPHY, WITH OBSTRUCTION (ICD-600.01) - on FLOMAX 0.4mg /d & PROSCAR 5mg /d... eval and Rx by [facility] who checks pt q63mo (we don't have notes from him)... pt tells me he will be seeing DrDavis regularly when he moves to [facility]. ~  6/13: pt reports that he is now seeing DrEskridge at [facility] & his PSAs have all been normal;  his major prob has been holding his urine 7 eval revealed not emptying completelt- they have tried several meds to help this; he will have notes sent to [place] for [facility] records.  OSTEOARTHRITIS (ICD-715.90) > as above- now managed by [facility] & difficult problem, even Humira trial w/o benefit. BACK PAIN (ICD-724.5) - pt saw DrBeane 6/10 for eval> neuropathy symptoms & lumbar spondylosis on XRays, Rx Mobic Prn... offered Myelogram for further eval... he also takes Calcium & Vit D.  PERIPHERAL NEUROPATHY (ICD-356.9) - DrReynolds evaluated him w/ NCV's etc and dx a peripheral neuropathy... this is one of his CC> numbness, decr sensation in feet, (no pain) w/ some assoc balance problems intermittently...  he tried Mentanx but no benefit...  ~  4/11: I reviewed DrReynolds 11/10 note w/ the patient... ~  He also had a thorough neurologic evaluation 10/11 from one of his church physicians, DrHickling, for f/u polyneuropathy> balance issues, LBP, gait abn, variable paresthesias, foot numbness (sl decr vibration & pin prick), some subjective memory problems w/ norm MMSE, abnormal NCVs;  Prev Labs were all WNL, unrevealing;  etiology of his neuropathy is unknown= cryptogenic, and since it isn't painful he really didn't require any therapy... ~  6/13: he continues to experience prob w/ balance etc related to his neuropathy; not progressive & still not painful; also notes nocturnal leg cramps & he's tried bar of soap trick, mustard, etc- all w/o relief (we rec trial Tonic water)...  Health Maintenance - he takes a number of Vits + lecithin, saw palmetto, Fish Oil, etc... he saw DrDJones in 2010 for Derm review- pt reports nothing major found...   Past Surgical History  Procedure Date  . Appendectomy   . Cholecystectomy   . Tonsillectomy   . Pacemaker placement     Outpatient Encounter Prescriptions as of 06/04/2012  Medication Sig Dispense Refill  . aspirin 81 MG tablet Take 81 mg by mouth daily.        .  calcium carbonate (OS-CAL) 600 MG TABS Take 600 mg by mouth daily.        . Cholecalciferol (VITAMIN D) 1000 UNITS capsule Take 1,000 Units by  mouth daily.        . finasteride (PROSCAR) 5 MG tablet Take 1 tablet by mouth daily.      . Lecithin 1200 MG CAPS Take 1 capsule by mouth daily.        Marland Kitchen loratadine (CLARITIN) 10 MG tablet Take 10 mg by mouth daily.        Marland Kitchen losartan-hydrochlorothiazide (HYZAAR) 100-12.5 MG per tablet Take 1 tablet by mouth daily.  90 tablet  3  . Multiple Vitamin (MULTIVITAMIN) capsule Take 1 capsule by mouth daily.        . Naproxen Sodium (ALEVE) 220 MG CAPS Take 1 capsule by mouth daily.        Marland Kitchen nystatin-triamcinolone (MYCOLOG II) cream as needed.      . Omega-3 Fatty Acids (FISH OIL) 500 MG CAPS Take 1 capsule by mouth daily.        Marland Kitchen omeprazole (PRILOSEC) 20 MG capsule Take 1 capsule (20 mg total) by mouth 2 (two) times daily.  180 capsule  3  . Tamsulosin HCl (FLOMAX) 0.4 MG CAPS Take 0.4 mg by mouth daily.        . traMADol (ULTRAM) 50 MG tablet Take 50 mg by mouth 2 (two) times daily as needed.        . [DISCONTINUED] omeprazole (PRILOSEC) 20 MG capsule TAKE 1 CAPSULE TWICE DAILY  180 capsule  3    Allergies  Allergen Reactions  . Shellfish Allergy     Sensitive to shellfish     Current Medications, Allergies, Past Medical History, Past Surgical History, Family History, and Social History were reviewed in [employer] record.   Review of Systems        The patient complains of sleep disorder, dyspnea on exertion, gas/bloating, joint pain, stiffness, arthritis, paresthesias, and difficulty walking.  The patient denies fever, chills, sweats, anorexia, fatigue, weakness, malaise, weight loss, blurring, diplopia, eye irritation, eye discharge, vision loss, eye pain, photophobia, earache, ear discharge, tinnitus, decreased hearing, nasal congestion, nosebleeds, sore throat, hoarseness, chest pain, palpitations, syncope, orthopnea, PND,  peripheral edema, cough, dyspnea at rest, excessive sputum, hemoptysis, wheezing, pleurisy, nausea, vomiting, diarrhea, constipation, change in bowel habits, abdominal pain, melena, hematochezia, jaundice, indigestion/heartburn, dysphagia, odynophagia, dysuria, hematuria, urinary frequency, urinary hesitancy, nocturia, incontinence, back pain, joint swelling, muscle cramps, muscle weakness, sciatica, restless legs, leg pain at night, leg pain with exertion, rash, itching, dryness, suspicious lesions, paralysis, seizures, tremors, vertigo, transient blindness, frequent falls, frequent headaches, depression, anxiety, memory loss, confusion, cold intolerance, heat intolerance, polydipsia, polyphagia, polyuria, unusual weight change, abnormal bruising, bleeding, enlarged lymph nodes, urticaria, allergic rash, hay fever, and recurrent infections.     Objective:   Physical Exam     WD, WN, 78 y/o WM in NAD... GENERAL:  Alert & oriented; pleasant & cooperative... HEENT:  [place]/AT, EOM-wnl, PERRLA, EACs-clear, TMs-wnl, NOSE-clear, THROAT-clear & wnl. NECK:  Supple w/ fairROM; no JVD; normal carotid impulses w/o bruits; no thyromegaly or nodules palpated; no lymphadenopathy. CHEST:  Clear to P & A; without wheezes/ rales/ or rhonchi. HEART:  Regular Rhythm; without murmurs/ rubs/ or gallops; pacer in left shoulder area... ABDOMEN:  Soft & nontender; normal bowel sounds; no organomegaly or masses detected. EXT: without deformities, mild arthritic changes; no varicose veins/ +venous insuffic/ no edema. NEURO:  CN's intact;  fair gait;  Motor normal, sensory variable deficits... strength seems OK, min decr sensation in LEs. DERM:  No lesions noted; no rash etc...  RADIOLOGY DATA:  Reviewed in the  EPIC EMR & discussed w/ the patient...  LABORATORY DATA:  Reviewed in the EPIC EMR & discussed w/ the patient...   Assessment & Plan:   OSA>  Followed by DrClance & stable on CPAP; we will try to get him a new  machine thru [facility]...  HBP>  His BP is sl elev on diuretic alone & we have rec change to HYZAAR 100-12.5; he will monitor BP at home & f/u in office...  Hx Syncope/ Pacer>  Followed by DrKlein & pacer functioning normally...  DM, borderline>  On diet alone & managing well, A1c remains 6.4 & we reviewed the import of wt reduction...  GERD, Hx Barrett's>  S/p EGD from DrJohnson last yr & small segment Barrett's mucosa, no dysplasia...  Divertics, IBS>  He won't be due for f/u colon until 2018 per DrJohnson...  BPH>  Followed by DrEskridge at [facility]; biggest issue is control & he says it was from not emptying completely, they have tried several meds and continue to follow up regularly, he reports PSAs have been ok; he will get notes to [place]...  DJD>  Followed by [facility] P[facility] & still having difficulty w/ pain & stiffness...  Periph Neuropathy>  Eval by Neuro, cryptogenic, no meds needed & symptoms remain the same...   Patient's Medications  New Prescriptions   No medications on file  Previous Medications   ASPIRIN 81 MG TABLET    Take 81 mg by mouth daily.     CALCIUM CARBONATE (OS-CAL) 600 MG TABS    Take 600 mg by mouth daily.     CHOLECALCIFEROL (VITAMIN D) 1000 UNITS CAPSULE    Take 1,000 Units by mouth daily.     FINASTERIDE (PROSCAR) 5 MG TABLET    Take 1 tablet by mouth daily.   LECITHIN 1200 MG CAPS    Take 1 capsule by mouth daily.     LORATADINE (CLARITIN) 10 MG TABLET    Take 10 mg by mouth daily.     LOSARTAN-HYDROCHLOROTHIAZIDE (HYZAAR) 100-12.5 MG PER TABLET    Take 1 tablet by mouth daily.   MULTIPLE VITAMIN (MULTIVITAMIN) CAPSULE    Take 1 capsule by mouth daily.     NAPROXEN SODIUM (ALEVE) 220 MG CAPS    Take 1 capsule by mouth daily.     NYSTATIN-TRIAMCINOLONE (MYCOLOG II) CREAM    as needed.   OMEGA-3 FATTY ACIDS (FISH OIL) 500 MG CAPS    Take 1 capsule by mouth daily.     OMEPRAZOLE (PRILOSEC) 20 MG CAPSULE    Take 1 capsule (20 mg total) by mouth 2 (two)  times daily.   TAMSULOSIN HCL (FLOMAX) 0.4 MG CAPS    Take 0.4 mg by mouth daily.     TRAMADOL (ULTRAM) 50 MG TABLET    Take 50 mg by mouth 2 (two) times daily as needed.    Modified Medications   No medications on file  Discontinued Medications   OMEPRAZOLE (PRILOSEC) 20 MG CAPSULE    TAKE 1 CAPSULE TWICE DAILY

## 2012-06-04 NOTE — Patient Instructions (Addendum)
Today we updated your med list in our EPIC system...    Continue your current medications the same...  Today we gave you the 2013 Flu vaccine & the combination Tetanus shot called the TDAP (it is good for 10 yrs)...  We will obtain the "download" from your CPAP device & send a not of compliance to [facility] as requested...  Let's get on track w/ our diet & exercise program...  Call for any questions.Marland KitchenMarland Kitchen

## 2012-07-02 ENCOUNTER — Encounter: Payer: Self-pay | Admitting: Internal Medicine

## 2012-07-02 ENCOUNTER — Ambulatory Visit (INDEPENDENT_AMBULATORY_CARE_PROVIDER_SITE_OTHER): Payer: Medicare Other | Admitting: Internal Medicine

## 2012-07-02 VITALS — BP 122/80 | HR 72 | Resp 18 | Ht 69.0 in | Wt 216.0 lb

## 2012-07-02 DIAGNOSIS — Z950 Presence of cardiac pacemaker: Secondary | ICD-10-CM

## 2012-07-02 DIAGNOSIS — R55 Syncope and collapse: Secondary | ICD-10-CM

## 2012-07-02 LAB — PACEMAKER DEVICE OBSERVATION
AL IMPEDENCE PM: 644 Ohm
ATRIAL PACING PM: 0
BATTERY VOLTAGE: 2.75 V
RV LEAD AMPLITUDE: 5.6 mv

## 2012-07-02 NOTE — Patient Instructions (Signed)
Your physician wants you to follow-up in: 1 year with Dr. Graciela Husbands.  You will receive a reminder letter in the mail two months in advance. If you don't receive a letter, please call our office to schedule the follow-up appointment.  Remote monitoring is used to monitor your Pacemaker of ICD from home. This monitoring reduces the number of office visits required to check your device to one time per year. It allows [place] to keep an eye on the functioning of your device to ensure it is working properly. You are scheduled for a device check from home on 10/05/12. You may send your transmission at any time that day. If you have a wireless device, the transmission will be sent automatically. After your physician reviews your transmission, you will receive a postcard with your next transmission date.

## 2012-07-02 NOTE — Assessment & Plan Note (Signed)
The patient's device was interrogated.  The information was reviewed. No changes were made in the programming.    

## 2012-07-02 NOTE — Assessment & Plan Note (Signed)
No intercurrent syncope 

## 2012-07-02 NOTE — Progress Notes (Signed)
Patient Care Team: Michele Mcalpine, MD as PCP - General   HPI  Tony Cruz is a 78 y.o. male is seen in followup for syncope secondary to intermittent sinus node dysfunction for which a pacemaker was previously implanted and changed out 11/08.  He has had intercurrent syncope thought to be vasovagal  The patient denies chest pain, shortness of breath, nocturnal dyspnea, orthopnea or peripheral edema.  There have been no palpitations, lightheadedness or syncope.     He has OSA  and his machine has been read  Past Medical History  Diagnosis Date  . Allergic rhinitis, cause unspecified   . Obstructive sleep apnea (adult) (pediatric)   . Unspecified essential hypertension   . Syncope and collapse   . Cardiac pacemaker in situ   . Other abnormal glucose   . Overweight   . Esophageal reflux   . Diverticulosis of colon (without mention of hemorrhage)   . Irritable bowel syndrome   . Hypertrophy of prostate with urinary obstruction and other lower urinary tract symptoms (LUTS)   . Osteoarthrosis, unspecified whether generalized or localized, unspecified site   . Backache, unspecified   . Unspecified hereditary and idiopathic peripheral neuropathy     Past Surgical History  Procedure Date  . Appendectomy   . Cholecystectomy   . Tonsillectomy   . Pacemaker placement     Current Outpatient Prescriptions  Medication Sig Dispense Refill  . aspirin 81 MG tablet Take 81 mg by mouth daily.        . calcium carbonate (OS-CAL) 600 MG TABS Take 600 mg by mouth daily.        . Cholecalciferol (VITAMIN D) 1000 UNITS capsule Take 1,000 Units by mouth daily.        . finasteride (PROSCAR) 5 MG tablet Take 1 tablet by mouth daily.      . Lecithin 1200 MG CAPS Take 1 capsule by mouth daily.        Marland Kitchen loratadine (CLARITIN) 10 MG tablet Take 10 mg by mouth daily.        Marland Kitchen losartan-hydrochlorothiazide (HYZAAR) 100-12.5 MG per tablet Take 1 tablet by mouth daily.  90 tablet  3  . Multiple  Vitamin (MULTIVITAMIN) capsule Take 1 capsule by mouth daily.        . Naproxen Sodium (ALEVE) 220 MG CAPS Take 1 capsule by mouth daily.        Marland Kitchen nystatin-triamcinolone (MYCOLOG II) cream as needed.      . Omega-3 Fatty Acids (FISH OIL) 500 MG CAPS Take 1 capsule by mouth daily.        Marland Kitchen omeprazole (PRILOSEC) 20 MG capsule Take 1 capsule (20 mg total) by mouth 2 (two) times daily.  180 capsule  3  . Tamsulosin HCl (FLOMAX) 0.4 MG CAPS Take 0.4 mg by mouth daily.        . traMADol (ULTRAM) 50 MG tablet Take 50 mg by mouth 2 (two) times daily as needed.          Allergies  Allergen Reactions  . Shellfish Allergy     Sensitive to shellfish    Review of Systems negative except from HPI and PMH  Physical Exam BP 122/80  Pulse 72  Resp 18  Ht 5\' 9"  (1.753 m)  Wt 216 lb (97.977 kg)  BMI 31.90 kg/m2  SpO2 97% Well developed and nourished in no acute distress HENT normal Neck supple with JVP-flat Clear Regular rate and rhythm, no murmurs or gallops Abd-soft  with active BS without hepatomegaly No Clubbing cyanosis edema Skin-warm and dry A & Oriented  Grossly normal sensory and motor function    Assessment and  Plan

## 2012-09-28 ENCOUNTER — Encounter: Payer: Self-pay | Admitting: Adult Health

## 2012-09-28 ENCOUNTER — Ambulatory Visit (INDEPENDENT_AMBULATORY_CARE_PROVIDER_SITE_OTHER): Payer: Medicare Other | Admitting: Adult Health

## 2012-09-28 ENCOUNTER — Emergency Department (HOSPITAL_COMMUNITY)
Admission: EM | Admit: 2012-09-28 | Discharge: 2012-09-28 | Disposition: A | Discharge status: Home / Self Care | Payer: Medicare Other | Attending: Emergency Medicine | Admitting: Emergency Medicine

## 2012-09-28 ENCOUNTER — Encounter (HOSPITAL_COMMUNITY): Payer: Self-pay | Admitting: *Deleted

## 2012-09-28 VITALS — BP 120/80 | HR 73 | Temp 97.8°F | Ht 69.0 in | Wt 225.2 lb

## 2012-09-28 DIAGNOSIS — Z209 Contact with and (suspected) exposure to unspecified communicable disease: Secondary | ICD-10-CM

## 2012-09-28 DIAGNOSIS — G4733 Obstructive sleep apnea (adult) (pediatric): Secondary | ICD-10-CM | POA: Insufficient documentation

## 2012-09-28 DIAGNOSIS — N138 Other obstructive and reflux uropathy: Secondary | ICD-10-CM | POA: Insufficient documentation

## 2012-09-28 DIAGNOSIS — Z23 Encounter for immunization: Secondary | ICD-10-CM | POA: Insufficient documentation

## 2012-09-28 DIAGNOSIS — N401 Enlarged prostate with lower urinary tract symptoms: Secondary | ICD-10-CM | POA: Insufficient documentation

## 2012-09-28 DIAGNOSIS — K219 Gastro-esophageal reflux disease without esophagitis: Secondary | ICD-10-CM | POA: Insufficient documentation

## 2012-09-28 DIAGNOSIS — I10 Essential (primary) hypertension: Secondary | ICD-10-CM | POA: Insufficient documentation

## 2012-09-28 DIAGNOSIS — Z8719 Personal history of other diseases of the digestive system: Secondary | ICD-10-CM | POA: Insufficient documentation

## 2012-09-28 DIAGNOSIS — Z79899 Other long term (current) drug therapy: Secondary | ICD-10-CM | POA: Insufficient documentation

## 2012-09-28 DIAGNOSIS — Z8739 Personal history of other diseases of the musculoskeletal system and connective tissue: Secondary | ICD-10-CM | POA: Insufficient documentation

## 2012-09-28 DIAGNOSIS — Z8639 Personal history of other endocrine, nutritional and metabolic disease: Secondary | ICD-10-CM | POA: Insufficient documentation

## 2012-09-28 DIAGNOSIS — M1990 Unspecified osteoarthritis, unspecified site: Secondary | ICD-10-CM | POA: Insufficient documentation

## 2012-09-28 DIAGNOSIS — Z791 Long term (current) use of non-steroidal anti-inflammatories (NSAID): Secondary | ICD-10-CM | POA: Insufficient documentation

## 2012-09-28 DIAGNOSIS — Z7982 Long term (current) use of aspirin: Secondary | ICD-10-CM | POA: Insufficient documentation

## 2012-09-28 DIAGNOSIS — Z862 Personal history of diseases of the blood and blood-forming organs and certain disorders involving the immune mechanism: Secondary | ICD-10-CM | POA: Insufficient documentation

## 2012-09-28 DIAGNOSIS — G609 Hereditary and idiopathic neuropathy, unspecified: Secondary | ICD-10-CM | POA: Insufficient documentation

## 2012-09-28 DIAGNOSIS — Z950 Presence of cardiac pacemaker: Secondary | ICD-10-CM | POA: Insufficient documentation

## 2012-09-28 MED ORDER — RABIES IMMUNE GLOBULIN 150 UNIT/ML IM INJ
20.0000 [IU]/kg | INJECTION | Freq: Once | INTRAMUSCULAR | Status: AC
  Administered 2012-09-28: 2025 [IU] via INTRAMUSCULAR
  Filled 2012-09-28: qty 13.5

## 2012-09-28 MED ORDER — RABIES VACCINE, PCEC IM SUSR
1.0000 mL | Freq: Once | INTRAMUSCULAR | Status: AC
  Administered 2012-09-28: 1 mL via INTRAMUSCULAR
  Filled 2012-09-28: qty 1
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
## 2012-09-28 NOTE — ED Provider Notes (Signed)
Medical screening examination/treatment/procedure(s) were performed by non-physician practitioner and as supervising physician I was immediately available for consultation/collaboration.   Kevin M Campos, MD 09/28/12 2045 

## 2012-09-28 NOTE — Patient Instructions (Addendum)
Contact Health Department for Rabies vaccine series  Please contact office for sooner follow up if symptoms do not improve or worsen or seek emergency care   
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
## 2012-09-28 NOTE — Discharge Instructions (Signed)
Rabies Immune Globulin, human RIG solution for injection What is this medicine? RABIES IMMUNE GLOBULIN (ray BEES im MYOON GLOB yoo lin) is used to prevent rabies infection. Rabies is mostly a disease of animals. Humans may get rabies if they are bitten by animals that have rabies. This medicine is given to someone after they have been exposed. This medicine may be used for other purposes; ask your health care provider or pharmacist if you have questions. What should I tell my health care provider before I take this medicine? They need to know if you have any of these conditions: -bleeding disorder -IgA deficiency -recently received or scheduled to receive a vaccine -take medicines that treat or prevent blood clots -an unusual or allergic reaction to immune globulin, other medicines, foods, dyes, or preservatives -pregnant or trying to get pregnant  Rabies Vaccine suspension for injection What is this medicine? RABIES VACCINE (ray BEES vax EEN) is used to prevent rabies infection. Rabies is mostly a disease of animals. Humans may get rabies if they are bitten by animals that have rabies. The vaccine may be given to protect someone with a high risk of rabies or it may be given to someone after they have been exposed. This medicine may be used for other purposes; ask your health care provider or pharmacist if you have questions. What should I tell my health care provider before I take this medicine? They need to know if you have any of these conditions: -bleeding disorder -cancer -HIV or AIDS -immune system problems -low blood counts, like low white cell, platelet, or red cell counts -recent or ongoing radiation therapy -take medicines that treat or prevent blood clots -an unusual or allergic reaction to vaccines, albumin, eggs, neomycin, other medicines, foods, dyes, or preservatives -pregnant or trying to get pregnant -breast-feeding How should I use this medicine? This vaccine is for  injection into a muscle. It is given by a health care professional. A copy of Vaccine Information Statements will be given before each vaccination. Read this sheet carefully each time. The sheet may change frequently. Talk to your pediatrician regarding the use of this medicine in children. While this drug may be prescribed for children and infants, precautions do apply. Overdosage: If you think you've taken too much of this medicine contact a poison control center or emergency room at once. Overdosage: If you think you have taken too much of this medicine contact a poison control center or emergency room at once. NOTE: This medicine is only for you. Do not share this medicine with others. What if I miss a dose? Keep appointments for follow-up doses as directed. It is important not to miss your dose. Call your doctor or health care professional if you are unable to keep an appointment. All of the vaccine doses must be given in order to provide proper protection. What may interact with this medicine? -antimalarial drugs -etanercept -immune globulins -infliximab -medicines for organ transplant -medicines to treat cancer -other vaccines -some medicines for arthritis -steroid medicines like prednisone or cortisone This list may not describe all possible interactions. Give your health care provider a list of all the medicines, herbs, non-prescription drugs, or dietary supplements you use. Also tell them if you smoke, drink alcohol, or use illegal drugs. Some items may interact with your medicine. What should I watch for while using this medicine? This vaccine, like all vaccines, may not fully protect everyone. What side effects may I notice from receiving this medicine? Side effects that you should  report to your doctor or health care professional as soon as possible: -allergic reactions like skin rash, itching or hives, swelling of the face, lips, or tongue -changes in vision -joint pain with  fever -pain, tingling, numbness in the hands or feet -stiff neck and sensitivity to light -unusually weak or tired Side effects that usually do not require medical attention (Report these to your doctor or health care professional if they continue or are bothersome.): -dizziness -headache -muscle aches and pains -pain, redness, itching or swelling at site where injected -stomach pain -tiredness This list may not describe all possible side effects. Call your doctor for medical advice about side effects. You may report side effects to FDA at 1-800-FDA-1088. Where should I keep my medicine? This drug is given in a hospital or clinic and will not be stored at home. NOTE: This sheet is a summary. It may not cover all possible information. If you have questions about this medicine, talk to your doctor, pharmacist, or health care provider.  2012, [employer] Standard. (10/02/2009 3:31:42 PM) -breast-feeding How should I use this medicine? This medicine is for injection into the area around a wound or into a muscle. It is given by a health care professional in a hospital or clinic setting. A copy of Vaccine Information Statements will be given. Read this sheet carefully each time. The sheet may change frequently. Talk to your pediatrician regarding the use of this medicine in children. While this drug may be prescribed for children and infants, precautions do apply. Overdosage: If you think you've taken too much of this medicine contact a poison control center or emergency room at once. Overdosage: If you think you have taken too much of this medicine contact a poison control center or emergency room at once. NOTE: This medicine is only for you. Do not share this medicine with others. What if I miss a dose? This does not apply. What may interact with this medicine? -Live virus vaccines This list may not describe all possible interactions. Give your health care provider a list of all the  medicines, herbs, non-prescription drugs, or dietary supplements you use. Also tell them if you smoke, drink alcohol, or use illegal drugs. Some items may interact with your medicine. What should I watch for while using this medicine? This medicine can decrease the response to a vaccine. If you need to get vaccinated, tell your healthcare professional if you have received this medicine within the last 4 months. Extra booster doses may be needed. Talk to your doctor to see if a different vaccination schedule is needed. This medicine contains products from human blood. It may be possible to pass an infection in this medicine, but no cases have been reported. Talk to your doctor about the risks and benefits of this medicine. Your condition will be monitored carefully while you are receiving this medicine. What side effects may I notice from receiving this medicine? Side effects that you should report to your doctor or health care professional as soon as possible: -allergic reactions like skin rash, itching or hives, swelling of the face, lips, or tongue Side effects that usually do not require medical attention (Report these to your doctor or health care professional if they continue or are bothersome.): -fever -headache -pain, redness, swelling, or irritation at site where injected This list may not describe all possible side effects. Call your doctor for medical advice about side effects. You may report side effects to FDA at 1-800-FDA-1088. Where should I keep my medicine?  This drug is given in a hospital or clinic and will not be stored at home. NOTE: This sheet is a summary. It may not cover all possible information. If you have questions about this medicine, talk to your doctor, pharmacist, or health care provider.  2012, [employer]/Gold Standard. (10/12/2009 11:24:23 AM)

## 2012-09-28 NOTE — ED Notes (Signed)
Pt states wife called him in kitchen where there was a bat on the floor, he placed fire place gloves on and picked the bat up putting it outside, denies being bit by bat, animal control came to house, was recommended to come and get a rabies shot d/t not knowing how long bat had been in house and also did not have bat to test for rabies. Pt denies pain.

## 2012-09-28 NOTE — ED Provider Notes (Signed)
History    This chart was scribed for non-physician practitioner working with Lyanne Co, MD by Charolett Bumpers, ED Scribe. This patient was seen in room WTR7/WTR7 and the patient's care was started at 1610.   CSN: [id number]  Arrival date & time 09/28/12  1450   First MD Initiated Contact with Patient 09/28/12 1610      Chief Complaint  Patient presents with  . bat exposure   . Rabies Injection    The history is provided by the patient. No language interpreter was used.  Tony Cruz is a 79 y.o. male who presents to the Emergency Department complaining of a possible exposure to rabies via a bat. Pt states that 2 days ago, they found a bat in the kitchen. He disposed of the live bat, wearing gloves. They called animal control who advised them to come to ED for the rabies vaccine because they were unable to catch the bat for testing. They do not know how long the bat was in the house or how long they were exposed. He denies any bites or symptoms.     Past Medical History  Diagnosis Date  . Obstructive sleep apnea   . hypertension   . Syncope and collapse   . Cardiac pacemaker-MDT   . Other abnormal glucose   . Overweight   . Esophageal reflux   . Diverticulosis of colon (without mention of hemorrhage)   . Irritable bowel syndrome   . Hypertrophy of prostate with urinary obstruction and other lower urinary tract symptoms (LUTS)   . Osteoarthrosis, unspecified whether generalized or localized, unspecified site   . Backache, unspecified   . Unspecified hereditary and idiopathic peripheral neuropathy     Past Surgical History  Procedure Laterality Date  . Appendectomy    . Cholecystectomy    . Tonsillectomy    . Pacemaker placement      Family History  Problem Relation Age of Onset  . Coronary artery disease Father     bladder cancer  . Coronary artery disease Mother   . Diabetes Sister     2 sisters    History  Substance Use Topics  . Smoking status:  Never Smoker   . Smokeless tobacco: Never Used  . Alcohol Use: Not on file      Review of Systems A complete 10 system review of systems was obtained and all systems are negative except as noted in the HPI and PMH.   Allergies  Shellfish allergy  Home Medications   Current Outpatient Rx  Name  Route  Sig  Dispense  Refill  . acetaminophen (TYLENOL) 500 MG tablet   Oral   Take 1,000 mg by mouth every 6 (six) hours as needed for pain.         Marland Kitchen aspirin 81 MG tablet   Oral   Take 81 mg by mouth daily.           . calcium carbonate (OS-CAL) 600 MG TABS   Oral   Take 600 mg by mouth daily.           . Cholecalciferol (VITAMIN D) 1000 UNITS capsule   Oral   Take 1,000 Units by mouth daily.           . finasteride (PROSCAR) 5 MG tablet   Oral   Take 1 tablet by mouth daily.         . Lecithin 1200 MG CAPS   Oral   Take 1 capsule  by mouth daily.           Marland Kitchen loratadine (CLARITIN) 10 MG tablet   Oral   Take 10 mg by mouth daily.           Marland Kitchen losartan-hydrochlorothiazide (HYZAAR) 100-12.5 MG per tablet   Oral   Take 1 tablet by mouth daily.   90 tablet   3   . Multiple Vitamin (MULTIVITAMIN) capsule   Oral   Take 1 capsule by mouth daily.           . Naproxen Sodium (ALEVE) 220 MG CAPS   Oral   Take 1 capsule by mouth daily.           Marland Kitchen nystatin-triamcinolone (MYCOLOG II) cream      as needed.         . Omega-3 Fatty Acids (FISH OIL) 500 MG CAPS   Oral   Take 1 capsule by mouth daily.           Marland Kitchen omeprazole (PRILOSEC) 20 MG capsule   Oral   Take 1 capsule (20 mg total) by mouth 2 (two) times daily.   180 capsule   3   . Tamsulosin HCl (FLOMAX) 0.4 MG CAPS   Oral   Take 0.4 mg by mouth daily.           . traMADol (ULTRAM) 50 MG tablet   Oral   Take 50 mg by mouth 2 (two) times daily as needed for pain.            BP 126/82  Pulse 71  Temp(Src) 98 F (36.7 C) (Oral)  Resp 18  SpO2 97%  Physical Exam  Nursing note  and vitals reviewed. Constitutional: He is oriented to person, place, and time. He appears well-developed and well-nourished. No distress.  HENT:  Head: Normocephalic and atraumatic.  Pulmonary/Chest: Effort normal. No respiratory distress.  Musculoskeletal: Normal range of motion.  Moves all extremities.   Neurological: He is alert and oriented to person, place, and time.  Ambulatory.   Psychiatric: He has a normal mood and affect. His behavior is normal.    ED Course  Procedures (including critical care time)  DIAGNOSTIC STUDIES: Oxygen Saturation is 97% on room air, adequate by my interpretation.    COORDINATION OF CARE:  16:20-Discussed planned course of treatment with the patient including a starting the rabies vaccine series, who is agreeable at this time. Informed pt that subsequent vaccines will be obtained at [facility] short stay.   16:30-Medication Orders: Rabies vaccine (Rabavert) injection 1 mL-once; Rabies immune globulin (Hyperab) injection 2,025 units  Labs Reviewed - No data to display No results found.   No diagnosis found. Rabies exposure   MDM  History of bat in the house for unknown time, bat unavailable for testing. Rabies series initiated due to risk. .    I personally performed the services described in this documentation, which was scribed in my presence. The recorded information has been reviewed and is accurate.       Arnoldo Hooker, PA-C 09/28/12 1645

## 2012-09-30 ENCOUNTER — Encounter (HOSPITAL_COMMUNITY): Payer: Self-pay | Admitting: Emergency Medicine

## 2012-09-30 ENCOUNTER — Emergency Department (INDEPENDENT_AMBULATORY_CARE_PROVIDER_SITE_OTHER)
Admission: EM | Admit: 2012-09-30 | Discharge: 2012-09-30 | Disposition: A | Discharge status: Home / Self Care | Payer: Medicare Other | Source: Home / Self Care

## 2012-09-30 DIAGNOSIS — Z203 Contact with and (suspected) exposure to rabies: Secondary | ICD-10-CM

## 2012-09-30 MED ORDER — RABIES VACCINE, PCEC IM SUSR
INTRAMUSCULAR | Status: AC
  Filled 2012-09-30: qty 1

## 2012-09-30 MED ORDER — RABIES VACCINE, PCEC IM SUSR
1.0000 mL | Freq: Once | INTRAMUSCULAR | Status: AC
  Administered 2012-09-30: 1 mL via INTRAMUSCULAR

## 2012-09-30 NOTE — ED Notes (Signed)
Pt is here for rabies vaccines He is alert w/no signs of acute distress.

## 2012-10-04 ENCOUNTER — Emergency Department (INDEPENDENT_AMBULATORY_CARE_PROVIDER_SITE_OTHER)
Admission: EM | Admit: 2012-10-04 | Discharge: 2012-10-04 | Disposition: A | Discharge status: Home / Self Care | Payer: Medicare Other | Source: Home / Self Care

## 2012-10-04 ENCOUNTER — Encounter (HOSPITAL_COMMUNITY): Payer: Self-pay | Admitting: Emergency Medicine

## 2012-10-04 DIAGNOSIS — Z203 Contact with and (suspected) exposure to rabies: Secondary | ICD-10-CM

## 2012-10-04 MED ORDER — RABIES VACCINE, PCEC IM SUSR
1.0000 mL | Freq: Once | INTRAMUSCULAR | Status: AC
  Administered 2012-10-04: 1 mL via INTRAMUSCULAR

## 2012-10-04 MED ORDER — RABIES VACCINE, PCEC IM SUSR
INTRAMUSCULAR | Status: AC
  Filled 2012-10-04: qty 1

## 2012-10-04 NOTE — ED Notes (Signed)
Pt here for rabies injection only. Pt voices no concerns.

## 2012-10-05 ENCOUNTER — Encounter: Payer: Medicare Other | Admitting: *Deleted

## 2012-10-11 ENCOUNTER — Emergency Department (INDEPENDENT_AMBULATORY_CARE_PROVIDER_SITE_OTHER)
Admission: EM | Admit: 2012-10-11 | Discharge: 2012-10-11 | Disposition: A | Discharge status: Home / Self Care | Payer: Medicare Other | Source: Home / Self Care

## 2012-10-11 DIAGNOSIS — Z203 Contact with and (suspected) exposure to rabies: Secondary | ICD-10-CM

## 2012-10-11 MED ORDER — RABIES VACCINE, PCEC IM SUSR
INTRAMUSCULAR | Status: AC
  Filled 2012-10-11: qty 1

## 2012-10-11 MED ORDER — RABIES VACCINE, PCEC IM SUSR
1.0000 mL | Freq: Once | INTRAMUSCULAR | Status: AC
  Administered 2012-10-11: 1 mL via INTRAMUSCULAR

## 2012-10-13 ENCOUNTER — Encounter: Payer: Self-pay | Admitting: *Deleted

## 2012-10-16 ENCOUNTER — Ambulatory Visit (INDEPENDENT_AMBULATORY_CARE_PROVIDER_SITE_OTHER): Payer: Medicare Other | Admitting: *Deleted

## 2012-10-16 DIAGNOSIS — I495 Sick sinus syndrome: Secondary | ICD-10-CM

## 2012-10-16 DIAGNOSIS — Z950 Presence of cardiac pacemaker: Secondary | ICD-10-CM

## 2012-10-17 ENCOUNTER — Encounter: Payer: Self-pay | Admitting: Internal Medicine

## 2012-10-17 ENCOUNTER — Other Ambulatory Visit: Payer: Self-pay

## 2012-10-28 LAB — REMOTE PACEMAKER DEVICE
ATRIAL PACING PM: 0
BATTERY VOLTAGE: 2.75 V
RV LEAD THRESHOLD: 0.875 V
VENTRICULAR PACING PM: 0

## 2012-11-17 ENCOUNTER — Other Ambulatory Visit: Payer: Self-pay | Admitting: Pulmonary Disease

## 2012-11-26 ENCOUNTER — Encounter: Payer: Self-pay | Admitting: *Deleted

## 2012-11-30 ENCOUNTER — Telehealth: Payer: Self-pay | Admitting: Pulmonary Disease

## 2012-11-30 MED ORDER — LOSARTAN POTASSIUM-HCTZ 100-12.5 MG PO TABS: 1.0000 | ORAL_TABLET | Freq: Every day | ORAL | Status: DC

## 2012-11-30 NOTE — Telephone Encounter (Signed)
Rx has been sent in. Pt is aware. 

## 2012-12-18 ENCOUNTER — Other Ambulatory Visit: Payer: Self-pay | Admitting: Dermatology

## 2012-12-27 HISTORY — PX: OTHER SURGICAL HISTORY: SHX169

## 2013-01-25 ENCOUNTER — Ambulatory Visit (INDEPENDENT_AMBULATORY_CARE_PROVIDER_SITE_OTHER): Payer: Medicare Other | Admitting: Pulmonary Disease

## 2013-01-25 ENCOUNTER — Encounter: Payer: Self-pay | Admitting: Pulmonary Disease

## 2013-01-25 ENCOUNTER — Other Ambulatory Visit (INDEPENDENT_AMBULATORY_CARE_PROVIDER_SITE_OTHER): Payer: Medicare Other

## 2013-01-25 ENCOUNTER — Encounter: Payer: Medicare Other | Admitting: *Deleted

## 2013-01-25 VITALS — BP 126/84 | HR 62 | Temp 97.7°F | Ht 68.0 in | Wt 213.6 lb

## 2013-01-25 DIAGNOSIS — E663 Overweight: Secondary | ICD-10-CM

## 2013-01-25 DIAGNOSIS — I10 Essential (primary) hypertension: Secondary | ICD-10-CM

## 2013-01-25 DIAGNOSIS — F411 Generalized anxiety disorder: Secondary | ICD-10-CM

## 2013-01-25 DIAGNOSIS — N138 Other obstructive and reflux uropathy: Secondary | ICD-10-CM

## 2013-01-25 DIAGNOSIS — F419 Anxiety disorder, unspecified: Secondary | ICD-10-CM

## 2013-01-25 DIAGNOSIS — K5730 Diverticulosis of large intestine without perforation or abscess without bleeding: Secondary | ICD-10-CM

## 2013-01-25 DIAGNOSIS — E7800 Pure hypercholesterolemia, unspecified: Secondary | ICD-10-CM

## 2013-01-25 DIAGNOSIS — K219 Gastro-esophageal reflux disease without esophagitis: Secondary | ICD-10-CM

## 2013-01-25 DIAGNOSIS — R55 Syncope and collapse: Secondary | ICD-10-CM

## 2013-01-25 DIAGNOSIS — Z950 Presence of cardiac pacemaker: Secondary | ICD-10-CM

## 2013-01-25 DIAGNOSIS — R7309 Other abnormal glucose: Secondary | ICD-10-CM

## 2013-01-25 DIAGNOSIS — M1990 Unspecified osteoarthritis, unspecified site: Secondary | ICD-10-CM

## 2013-01-25 DIAGNOSIS — G4733 Obstructive sleep apnea (adult) (pediatric): Secondary | ICD-10-CM

## 2013-01-25 DIAGNOSIS — N401 Enlarged prostate with lower urinary tract symptoms: Secondary | ICD-10-CM

## 2013-01-25 DIAGNOSIS — G609 Hereditary and idiopathic neuropathy, unspecified: Secondary | ICD-10-CM

## 2013-01-25 LAB — CBC WITH DIFFERENTIAL/PLATELET
Basophils Relative: 0.7 % (ref 0.0–3.0)
Eosinophils Relative: 9.6 % — ABNORMAL HIGH (ref 0.0–5.0)
HCT: 40.9 % (ref 39.0–52.0)
Hemoglobin: 13.9 g/dL (ref 13.0–17.0)
Lymphs Abs: 2.2 10*3/uL (ref 0.7–4.0)
Monocytes Relative: 10.8 % (ref 3.0–12.0)
Platelets: 252 10*3/uL (ref 150.0–400.0)
RBC: 4.44 Mil/uL (ref 4.22–5.81)
WBC: 6.8 10*3/uL (ref 4.5–10.5)

## 2013-01-25 LAB — BASIC METABOLIC PANEL
BUN: 12 mg/dL (ref 6–23)
Calcium: 9.3 mg/dL (ref 8.4–10.5)
Creatinine, Ser: 0.9 mg/dL (ref 0.4–1.5)
GFR: 84.76 mL/min (ref >60.00)
Glucose, Bld: 114 mg/dL — ABNORMAL HIGH (ref 70–99)

## 2013-01-25 LAB — LIPID PANEL
Cholesterol: 182 mg/dL (ref 0–200)
HDL: 50.3 mg/dL (ref >39.00)
LDL Cholesterol: 94 mg/dL (ref 0–99)
Triglycerides: 190 mg/dL — ABNORMAL HIGH (ref 0.0–149.0)
VLDL: 38 mg/dL (ref 0.0–40.0)

## 2013-01-25 LAB — TSH: TSH: 1.27 u[IU]/mL (ref 0.35–5.50)

## 2013-01-25 LAB — HEPATIC FUNCTION PANEL
AST: 26 U/L (ref 0–37)
Albumin: 4.3 g/dL (ref 3.5–5.2)

## 2013-01-25 LAB — PSA: PSA: 0.89 ng/mL (ref 0.10–4.00)

## 2013-01-25 NOTE — Progress Notes (Signed)
Subjective:    Patient ID: Tony Cruz, male    DOB: [birth year], 79 y.o.   MRN: [id number]  HPI 79 y/o WM here for a follow up visit... he has multiple medical problems as noted below...   ~  January 24, 2011:  50mo ROV & he reports "another exciting year of retirement"> c/o neuropathy symptoms & arthritis esp in hands w/ recent eval by DrAnderson & on a trial of HUMIRA injections now (we don't have his note)...    He saw DrClance 4/11 for Sleep Med consult> hx mild OSA from sleep study at the [facility] ?AHI=9-21? & titrated to CPAP=11 & tol well;  He was asked to work on weight reduction...    He saw DrKlein 12/11 for f/u syncope secondary to sinus node dysfunction, treated w/ pacer & it was recently replaced;  He noted intercurrent syncope that was felt to be vasovagal in origin (this occurred in the [facility] clinic 8/11 while waiting for his wife, & he has a long hx of this from blood draws over the yrs) & DrKlein reprogramed his device; EKG w/ SBrady & otherw WNL.Marland KitchenMarland Kitchen    He saw DrMJohnson for GI & had an EGD 5/11 which was essentially neg...    He also had a thorough neurologic evaluation 10/11 from one of his church physicians, DrHickling, for f/u polyneuropathy> balance issues, LBP, gait abn, variable paresthesias, foot numbness (sl decr vibration & pin prick), some subjective memory problems w/ norm MMSE, abnormal NCVs;  Prev Labs were all WNL, unrevealing;  etiology of his neuropathy is unknown= cryptogenic, and since it isn't painful he really didn't require any therapy...    LABS today showed FLP looking good on Fish Oil alone; Chems= wnl w/ BS=104 & A1c=6.4, CBC=norm, TSH wnl...  ~  January 24, 2012:  26yr ROV & Tony Cruz is reasonably stable "just the usual problems w/ aging" he says;  CC is arthritis pain "nothing helps" he says but using Tramadol prn; still c/o his neuropathy w/ balance issues etc but fortunately no pain from that; he still does yard work, Sales executive, Catering manager...    He saw DrKlein for  Cards/EP f/u 12/12> hx syncope related to SAnode dysfunction- treated w/ pacer & generator changed 2008; devise working properly & no changes made to the programming; TSH & sed rate wetre checked and WNL.Marland KitchenMarland Kitchen    He saw DrClance for Sleep f/u> noted 10# wt gain, pt reported using CPAP compliantly w/ good daytime alertness, no issues w/ mask but requesting new machine as his is >35yrs old; requested to work on wt reduction...    He has been followed by [facility] for Rheum> ?poss psoriatic arthritis variant w/ inflamm arthritis but nothing has really worked for him including trials of Pred, NSAIDs, MTX, anti-TNF inhibitor; last note dated 2/13 w/ trial of Celebrex but no better on that either...    We reviewed prob list, meds, xrays and labs> see below>> LABS 6/13:  FLP- at goals on diet alone;  Chems- ok w/ BS=105 A1c=6.4;  CBC- wnl x 13% eos;  TSH=1.11  ~  June 04, 2012:  41mo ROV & Tony Cruz needs a face-to-face visit for a new CPAP machine & supplies; he was applying thru [facility], has been using [employer] & received a new machine 9/13- we requested download w/ compliance report dates 10/7- 06/02/12 showing 100% compliance, >4H on 18d w/ median usage ~5H/night; CPAP set at 11 w/ AHI<1 on his machine... Note> similar number provided for the month  before as well...    He is also c/o LBP and is requesting referral for PT> he has been seeing his chiropractor & notes that the adjustments are of little help; he started working w/ a physical therapist- doing McKenzie exercises & wants referral for this purpose...    He also notes slow progression of his periph neuropathy symptoms w/ decr sensation & balance is poor; he was concerned that Neuro has signed off & I offered him a second opinion consult if he requests...    We reviewed his mult somatic complaints as well- stomach rolling, had f/u DrEskridge 9/13- reviewed, etc... We reviewed prob list, meds, xrays and labs> see below for updates >> OK Flu shot & TDAP  today...  ~  January 25, 2013:  40mo ROV & Tony Cruz has mult somatic complaints & "issues w/ aging" including arthritis, LBP, neuropathy> he & wife were exposed to a bat in their house and got a series of rabies vaccinations thru the ER... we reviewed the following medical problems during today's office visit >>     OSA> his eval 7 initial therapy was done by the [place]; followed by DrClance w/ good compliance & no issues w/ sleep or daytime hypersomnolence...     HBP> on ASA81, Hyzaar100-12.5; BP= 126/84 & he denies CP, palpit, SOB, edema...    HxSyncope, sinus node dysfunction, Pacer> followed by DrKlein, pacer function normally, no issues ident...    BorderlineDM> on diet alone; Labs 7/14 showed BS=114, A1c=6.6; we reviewed diet, exercise, wt reduction...     Overweight> weight= 214# and BMI= 32...    GI- GERD, Barrett's, Divertics, IBS> on Prilosec20Bid; known 5cmHH, HxBarrett's, prev Rx by DrWeissman & he would like to see [facility]Pyrtle at [facility]...    GU- BPH> on Flomax0.4 & Proscar5; followed by DrRDavis=> DrEskridge    DJD, LBP> on Naprosyn, Tramadol; c/o hands and ankles stiff but he still plays golf; eval by [employer] & he released him "I've done all I can do for you"    Periph neuropathy>     Skin cancer> he had a squamous cell removed from his left cheek 5/14 by DrDJones... We reviewed prob list, meds, xrays and labs> see below for updates >>  LABS 7/14:  FLP- ok x TG=190 on diet alone;  Chems- ok x BS=114 A1c=6.6;  CBC- wnl;  TSH=1.27;  PSA=0.89;  Uric=5.9.Marland KitchenMarland Kitchen    Problem List:   OBSTRUCTIVE SLEEP APNEA (ICD-327.23) - evaluation, diagnosis, and Rx by the [facility] (their notes scanned into the EMR):  he had PSG 4/08 w/ AHI= 26, desat to 83%... placed on CPAP at 11cm H20 pressure... he also has some RLS & was on Requip transiently from the [place]. ~  4/11: pt indicates that he uses his CPAP 4/7 nights for 6-7 hr/night; he feels that he rests well, denies daytime hypersom, snoring, etc... he requests Sleep  consult here in [place] (see DrClance consult). ~  5/13: he saw DrClance for Sleep f/u> noted 10# wt gain, pt reported using CPAP compliantly w/ good daytime alertness, no issues w/ mask but requesting new machine ([employer]) as his is >19yrs old; requested to work on wt reduction...  ALLERGIC RHINITIS (ICD-477.9) - controlled on Claritin, and OTC meds...   HYPERTENSION (ICD-401.9) - on ASA 81mg /d & HCTZ 25mg /d...  ~  CXR 4/11 is clear and WNL, NAD... ~  6/12:  BP=122/82 today and he denies HA, fatigue, visual changes, CP, palipit, dizziness, syncope, dyspnea, edema, etc... ~  6/13:  BP= 142/98 &  he admits intermittently elev at home; we discussed change med to LOSARTAN/ HCT 100/12.5 daily; he will monitor BP & f/u here... ~  11/13:  BP= 132/88 & similar at home; denies CP, palpit, SOB, edema...  Hx of SYNCOPE (ICD-780.2) & CARDIAC PACEMAKER IN SITU (ICD-V45.01) - he had pacer changed 11/08 DrKlein (hosp DC Summary reviewed)... he continues regular pacer checks and f/u w/ DrKlein...  ~  cath 3/99 DrJoeLeB showed norm coronaries, norm LV, norm Ao... ~  He saw DrKlein 12/11 for f/u syncope secondary to sinus node dysfunction, treated w/ pacer & generator replaced 2008;  He noted intercurrent syncope that was felt to be vasovagal in origin (this occurred in [facility] 8[facility]11 while waiting for his wife, & he has a long hx of this from blood draws over the yrs) & DrKlein reprogramed his device; EKG w/ SBrady & otherw WNL... ~  He saw DrKlein for Cards/EP f/u 12/12> hx syncope related to [facility] dysfunction- devise working properly & no changes made to the programming; TSH & sed rate wetre checked and WNL... ~  Yearly f/u DrKlein 12/13> doing satis w/o intercurrent syncope, pacer working satis & f/u planned 21yr...  DIABETES MELLITUS, BORDERLINE (ICD-790.29) - he's been concerned about his BS due to [facility] and his neuropathy symptoms, but his BS has only been borderline elvated prev...  ~  labs 4/10  (wt=209#) showed BS= 113, A1c= 6.3.Marland Kitchen. rec> better diet, get wt down. ~  labs 4/11 (wt=222#) showed BS= 109, A1c= 6.2.Marland Kitchen. no meds yet, just needs to get wt down. ~  Labs 6/12 (wt=204#) showed BS= 102, A1v= 6.4 ~  Labs 6/13 (wt=213#) showed BS= 105, A1c= 6.4  OVERWEIGHT (ICD-278.02) - we discussed diet + exercise therapy (again)... ~  weight 4/09 = 226#.Marland KitchenMarland Kitchen 5" 9" Tall for a BMI= 33-34 ~  weight 4/10 = 209#... BMI down to 31... keep up the good work. ~  weight 4/11 = 222# .Marland KitchenMarland Kitchen BMI up to 33. ~  Weight 6/12 = 204# ~  Weight 6/13 = 213#  GASTROESOPHAGEAL REFLUX DISEASE (ICD-530.81) w/ BARRETT'S ESOPH - followed by DrWeissman & Rx w/ OMEPRAZOLE 20mg Bid... ~  prev EGD's documented a 5cmHH & Barrett's esoph... (last 1/03 by DrPatterson). ~  last EGD was 2/08 showing Barrett's epithelium in lower 1/3 of esoph... f/u planned 66yrs. ~  4/11:  NOTE> f/u EGD is due, he will decide on gastroenterologist ==> he saw DrMJohnson w/ EGD 5/11 showing Irreg Z-line & bx c/w Barrett's esoph (no dysplasia), the rest of the esoph, stomach, & duodenum were WNL...  DIVERTICULOSIS OF COLON (ICD-562.10) -  ~  last colonoscopy 2/08 by DrWeissman showed left sided divertics, otherw neg... f/u planned 70yrs...  IRRITABLE BOWEL SYNDROME (ICD-564.1)  BENIGN PROSTATIC HYPERTROPHY, WITH OBSTRUCTION (ICD-600.01) - on FLOMAX 0.4mg /d & PROSCAR 5mg /d... eval and Rx by [facility] who checks pt q46mo (we don't have notes from him)... pt tells me he will be seeing DrDavis regularly when he moves to [facility]. ~  9/13: pt reports that he is now seeing DrEskridge at [facility] & his PSAs have all been normal; his major prob has been holding his urine & eval revealed not emptying completely- they have tried several meds to help this; he will have notes sent to [place] for [facility] records.  OSTEOARTHRITIS (ICD-715.90) > managed by [facility] & difficult problem, thought to have an inflamm arthropathy (?variation of psoriatic arthritis) however he did  not respond to NSAIDs/Celebrex, Pred, MTX, anti-TNF inhibitor; on Tramadol prn;  BACK  PAIN (ICD-724.5) - pt saw DrBeane 6/10 for eval> neuropathy symptoms & lumbar spondylosis on XRays, Rx Mobic Prn... offered Myelogram for further eval... he also takes Calcium & Vit D.  PERIPHERAL NEUROPATHY (ICD-356.9) - DrReynolds evaluated him w/ NCV's etc and dx a peripheral neuropathy... this is one of his CC> numbness, decr sensation in feet, (no pain) w/ some assoc balance problems intermittently...  he tried Mentanx but no benefit...  ~  4/11: I reviewed DrReynolds 11/10 note w/ the patient... ~  He also had a thorough neurologic evaluation 10/11 from one of his church physicians, DrHickling, for f/u polyneuropathy> balance issues, LBP, gait abn, variable paresthesias, foot numbness (sl decr vibration & pin prick), some subjective memory problems w/ norm MMSE, abnormal NCVs;  Prev Labs were all WNL, unrevealing;  etiology of his neuropathy is unknown= cryptogenic, and since it isn't painful he really didn't require any therapy... ~  6/13: he continues to experience prob w/ balance etc related to his neuropathy; not progressive & still not painful; also notes nocturnal leg cramps & he's tried bar of soap trick, mustard, etc- all w/o relief (we rec trial Tonic water)...  Health Maintenance - he takes a number of Vits + lecithin, saw palmetto, Fish Oil, etc... he saw DrDJones in 2010 for Derm review- pt reports nothing major found...   Past Surgical History  Procedure Laterality Date  . Appendectomy    . Cholecystectomy    . Tonsillectomy    . Pacemaker placement      Outpatient Encounter Prescriptions as of 01/25/2013  Medication Sig Dispense Refill  . acetaminophen (TYLENOL) 500 MG tablet Take 1,000 mg by mouth every 6 (six) hours as needed for pain.      Marland Kitchen aspirin 81 MG tablet Take 81 mg by mouth daily.        . calcium carbonate (OS-CAL) 600 MG TABS Take 600 mg by mouth daily.        .  Cholecalciferol (VITAMIN D) 1000 UNITS capsule Take 1,000 Units by mouth daily.        . finasteride (PROSCAR) 5 MG tablet Take 1 tablet by mouth daily.      . Lecithin 1200 MG CAPS Take 1 capsule by mouth daily.        Marland Kitchen loratadine (CLARITIN) 10 MG tablet Take 10 mg by mouth daily.        Marland Kitchen losartan-hydrochlorothiazide (HYZAAR) 100-12.5 MG per tablet Take 1 tablet by mouth daily.  90 tablet  3  . Multiple Vitamin (MULTIVITAMIN) capsule Take 1 capsule by mouth daily.        . Naproxen Sodium (ALEVE) 220 MG CAPS Take 1 capsule by mouth daily.        Marland Kitchen nystatin-triamcinolone (MYCOLOG II) cream as needed.      Marland Kitchen omeprazole (PRILOSEC) 20 MG capsule TAKE 1 CAPSULE TWICE DAILY  180 capsule  2  . Tamsulosin HCl (FLOMAX) 0.4 MG CAPS Take 0.4 mg by mouth daily.        . traMADol (ULTRAM) 50 MG tablet Take 50 mg by mouth 2 (two) times daily as needed for pain.       . [DISCONTINUED] Omega-3 Fatty Acids (FISH OIL) 500 MG CAPS Take 1 capsule by mouth daily.        . [DISCONTINUED] omeprazole (PRILOSEC) 20 MG capsule Take 1 capsule (20 mg total) by mouth 2 (two) times daily.  180 capsule  3   No facility-administered encounter medications on file as of 01/25/2013.  Allergies  Allergen Reactions  . Shellfish Allergy     Sensitive to shellfish    Current Medications, Allergies, Past Medical History, Past Surgical History, Family History, and Social History were reviewed in [employer] record.   Review of Systems        The patient complains of sleep disorder, dyspnea on exertion, gas/bloating, joint pain, stiffness, arthritis, paresthesias, and difficulty walking.  The patient denies fever, chills, sweats, anorexia, fatigue, weakness, malaise, weight loss, blurring, diplopia, eye irritation, eye discharge, vision loss, eye pain, photophobia, earache, ear discharge, tinnitus, decreased hearing, nasal congestion, nosebleeds, sore throat, hoarseness, chest pain, palpitations,  syncope, orthopnea, PND, peripheral edema, cough, dyspnea at rest, excessive sputum, hemoptysis, wheezing, pleurisy, nausea, vomiting, diarrhea, constipation, change in bowel habits, abdominal pain, melena, hematochezia, jaundice, indigestion/heartburn, dysphagia, odynophagia, dysuria, hematuria, urinary frequency, urinary hesitancy, nocturia, incontinence, back pain, joint swelling, muscle cramps, muscle weakness, sciatica, restless legs, leg pain at night, leg pain with exertion, rash, itching, dryness, suspicious lesions, paralysis, seizures, tremors, vertigo, transient blindness, frequent falls, frequent headaches, depression, anxiety, memory loss, confusion, cold intolerance, heat intolerance, polydipsia, polyphagia, polyuria, unusual weight change, abnormal bruising, bleeding, enlarged lymph nodes, urticaria, allergic rash, hay fever, and recurrent infections.     Objective:   Physical Exam     WD, WN, 79 y/o WM in NAD... GENERAL:  Alert & oriented; pleasant & cooperative... HEENT:  [place]/AT, EOM-wnl, PERRLA, EACs-clear, TMs-wnl, NOSE-clear, THROAT-clear & wnl. NECK:  Supple w/ fairROM; no JVD; normal carotid impulses w/o bruits; no thyromegaly or nodules palpated; no lymphadenopathy. CHEST:  Clear to P & A; without wheezes/ rales/ or rhonchi. HEART:  Regular Rhythm; without murmurs/ rubs/ or gallops; pacer in left shoulder area... ABDOMEN:  Soft & nontender; normal bowel sounds; no organomegaly or masses detected. EXT: without deformities, mild arthritic changes; no varicose veins/ +venous insuffic/ no edema. NEURO:  CN's intact;  fair gait;  Motor normal, sensory variable deficits... strength seems OK, min decr sensation in LEs. DERM:  No lesions noted; no rash etc...  RADIOLOGY DATA:  Reviewed in the EPIC EMR & discussed w/ the patient...  LABORATORY DATA:  Reviewed in the EPIC EMR & discussed w/ the patient...   Assessment & Plan:    OSA>  Followed by DrClance & stable on CPAP; we  will try to get him a new machine thru [facility]...  HBP>  His BP is sl elev on diuretic alone & changed to HYZAAR 100-12.5 & improved...  Hx Syncope/ Pacer>  Followed by DrKlein & pacer functioning normally...  DM, borderline>  On diet alone & managing well, A1c remains 6.6 & we reviewed the import of wt reduction...  GERD, Hx Barrett's>  S/p EGD from DrJohnson last yr & small segment Barrett's mucosa, no dysplasia...  Divertics, IBS>  He won't be due for f/u colon until 2018 & he wants to switch GI to DrPyrtle...  BPH>  Followed by DrEskridge at [facility]; biggest issue is control & he says it was from not emptying completely, they have tried several meds and continue to follow up regularly, he reports PSAs have been ok; he will get notes to [place]...  DJD>  Followed by [facility] & still having difficulty w/ pain & stiffness...  Periph Neuropathy>  Eval by Neuro, cryptogenic, no meds needed & symptoms remain the same...   Patient's Medications  New Prescriptions   No medications on file  Previous Medications   ACETAMINOPHEN (TYLENOL) 500 MG TABLET    Take 1,000  mg by mouth every 6 (six) hours as needed for pain.   ASPIRIN 81 MG TABLET    Take 81 mg by mouth daily.     CALCIUM CARBONATE (OS-CAL) 600 MG TABS    Take 600 mg by mouth daily.     CHOLECALCIFEROL (VITAMIN D) 1000 UNITS CAPSULE    Take 1,000 Units by mouth daily.     FINASTERIDE (PROSCAR) 5 MG TABLET    Take 1 tablet by mouth daily.   FISH OIL-OMEGA-3 FATTY ACIDS 1000 MG CAPSULE    Take 1 g by mouth daily.   LECITHIN 1200 MG CAPS    Take 1 capsule by mouth daily.     LORATADINE (CLARITIN) 10 MG TABLET    Take 10 mg by mouth daily.     LOSARTAN-HYDROCHLOROTHIAZIDE (HYZAAR) 100-12.5 MG PER TABLET    Take 1 tablet by mouth daily.   MULTIPLE VITAMIN (MULTIVITAMIN) CAPSULE    Take 1 capsule by mouth daily.     NAPROXEN SODIUM (ALEVE) 220 MG CAPS    Take 1 capsule by mouth daily.     NYSTATIN-TRIAMCINOLONE (MYCOLOG II) CREAM     as needed.   OMEPRAZOLE (PRILOSEC) 20 MG CAPSULE    TAKE 1 CAPSULE TWICE DAILY   TAMSULOSIN HCL (FLOMAX) 0.4 MG CAPS    Take 0.4 mg by mouth daily.     TRAMADOL (ULTRAM) 50 MG TABLET    Take 50 mg by mouth 2 (two) times daily as needed for pain.   Modified Medications   No medications on file  Discontinued Medications   OMEGA-3 FATTY ACIDS (FISH OIL) 500 MG CAPS    Take 1 capsule by mouth daily.     OMEPRAZOLE (PRILOSEC) 20 MG CAPSULE    Take 1 capsule (20 mg total) by mouth 2 (two) times daily.

## 2013-01-25 NOTE — Patient Instructions (Addendum)
Today we updated your med list in our EPIC system...    Continue your current medications the same...  Today we did your follow up FASTING blood work...    We will contact you w/ the results when available, and we will mail a copy to you as requested...  We will arrange for a GI appt w/ drPyrtle at your convenience to discuss your hx of Barrett's esoph & see when your next endoscopy may be due...  Call for any questions...  Let's plan a follow up visit in 48mo, sooner if needed for problems.Marland KitchenMarland Kitchen

## 2013-02-03 ENCOUNTER — Encounter: Payer: Self-pay | Admitting: *Deleted

## 2013-02-08 ENCOUNTER — Ambulatory Visit (INDEPENDENT_AMBULATORY_CARE_PROVIDER_SITE_OTHER): Payer: Medicare Other | Admitting: *Deleted

## 2013-02-08 ENCOUNTER — Encounter: Payer: Self-pay | Admitting: Internal Medicine

## 2013-02-08 DIAGNOSIS — I495 Sick sinus syndrome: Secondary | ICD-10-CM

## 2013-02-08 DIAGNOSIS — Z950 Presence of cardiac pacemaker: Secondary | ICD-10-CM

## 2013-02-16 LAB — REMOTE PACEMAKER DEVICE
AL AMPLITUDE: 2.8 mv
AL IMPEDENCE PM: 645 Ohm
BATTERY VOLTAGE: 2.75 V
RV LEAD AMPLITUDE: 8 mv

## 2013-02-18 ENCOUNTER — Encounter: Payer: Self-pay | Admitting: *Deleted

## 2013-03-03 ENCOUNTER — Other Ambulatory Visit: Payer: Self-pay

## 2013-04-14 ENCOUNTER — Telehealth: Payer: Self-pay | Admitting: Gastroenterology

## 2013-04-15 NOTE — Telephone Encounter (Signed)
Called pt to explain our policy on switching providers. Pt states he has not been here for several years; has been a pt of [facility]. His current EGD from 2011 is in EPIC, but I can't find any other records from [place]; pt reports he signed a release of info and it is in the chart. Dr Jarold Motto, OK for pt to switch to Dr Rhea Belton; his wife sees Dr Rhea Belton? Thanks.

## 2013-04-27 ENCOUNTER — Telehealth: Payer: Self-pay | Admitting: Pulmonary Disease

## 2013-04-27 NOTE — Telephone Encounter (Signed)
Informed patient to call [facility] and get records faxed to our office to review. The only records in our computer system are from 2011 and we need all records sent to [place]. Told patient that as soon as he sends the records to [place] then we can schedule his appointment per Dr. Rhea Belton. Patient states he signed a release at Dr. Jodelle Green office. Told patient to contact them to get them to refax the release to get records. Pt agreed.

## 2013-04-27 NOTE — Telephone Encounter (Signed)
Christy, please call pt and schedule an appt and call for records from [facility] . Thanks,

## 2013-04-27 NOTE — Telephone Encounter (Signed)
Called and LM on Vmail to CB. We will need records from [facility]

## 2013-04-27 NOTE — Telephone Encounter (Signed)
Yes, need all [facility] or outside GI records before OV

## 2013-04-27 NOTE — Telephone Encounter (Signed)
Called and spoke with pt and he stated that he is wanting to schedule an appt with Dr. Rhea Belton but is unable to do so until Dr. Henriette Combs office faxes over his OV notes.  i advised the pt that i will refax this since this was from 6-30.   Pt voiced his understanding.

## 2013-04-27 NOTE — Telephone Encounter (Signed)
Can of course switch

## 2013-04-27 NOTE — Telephone Encounter (Signed)
Dr Rhea Belton , will you accept this pt, his wife is a pt of yours.

## 2013-05-03 ENCOUNTER — Telehealth: Payer: Self-pay | Admitting: Pulmonary Disease

## 2013-05-03 NOTE — Telephone Encounter (Signed)
Marcellus Scott, CMA at 04/27/2013 5:17 PM   Status: Signed            Called and spoke with pt and he stated that he is wanting to schedule an appt with Dr. Rhea Belton but is unable to do so until Dr. Henriette Combs office faxes over his OV notes. i advised the pt that i will refax this since this was from 6-30. Pt voiced his understanding.  ---  I spoke with pt. He is wanting to know if we received his records yet? Please advise [name] thanks

## 2013-05-03 NOTE — Telephone Encounter (Signed)
No records have been received

## 2013-05-03 NOTE — Telephone Encounter (Signed)
I called Dr. Henriette Combs office [phone]. Release is scanned into EPIC shows it was faxed 01/25/13. I spoke with Medical records to have them fax over pt's records. I was advised they did not receive this until 04/28/13. They will fax what they have on file. They are faxing this to [employer]front fax. Will forward to leigh for when this is received so pt can be notified.

## 2013-05-04 NOTE — Telephone Encounter (Signed)
Records have been received from Dr. Ian Malkin office.

## 2013-05-04 NOTE — Telephone Encounter (Signed)
Called, spoke with pt.  Informed him of below.  He verbalized understanding and would like to ensure they get the GI dept.  Pt states he cannot get a GI appt without them first seeing these records.  Will route to [name] as FYI.

## 2013-05-04 NOTE — Telephone Encounter (Signed)
We will make sure that the pts information gets to GI.  thanks

## 2013-05-05 ENCOUNTER — Encounter: Payer: Self-pay | Admitting: Internal Medicine

## 2013-05-17 ENCOUNTER — Ambulatory Visit (INDEPENDENT_AMBULATORY_CARE_PROVIDER_SITE_OTHER): Payer: Medicare Other | Admitting: *Deleted

## 2013-05-17 DIAGNOSIS — Z950 Presence of cardiac pacemaker: Secondary | ICD-10-CM

## 2013-05-17 DIAGNOSIS — I495 Sick sinus syndrome: Secondary | ICD-10-CM

## 2013-05-21 ENCOUNTER — Encounter: Payer: Self-pay | Admitting: Internal Medicine

## 2013-05-25 ENCOUNTER — Encounter: Payer: Self-pay | Admitting: Internal Medicine

## 2013-05-25 ENCOUNTER — Ambulatory Visit (INDEPENDENT_AMBULATORY_CARE_PROVIDER_SITE_OTHER): Payer: Medicare Other | Admitting: Internal Medicine

## 2013-05-25 VITALS — BP 122/86 | HR 73 | Ht 69.0 in | Wt 215.2 lb

## 2013-05-25 DIAGNOSIS — K625 Hemorrhage of anus and rectum: Secondary | ICD-10-CM

## 2013-05-25 DIAGNOSIS — Z1211 Encounter for screening for malignant neoplasm of colon: Secondary | ICD-10-CM

## 2013-05-25 DIAGNOSIS — K602 Anal fissure, unspecified: Secondary | ICD-10-CM

## 2013-05-25 DIAGNOSIS — K2270 Barrett's esophagus without dysplasia: Secondary | ICD-10-CM

## 2013-05-25 MED ORDER — MOVIPREP 100 G PO SOLR: ORAL | Status: DC

## 2013-05-25 NOTE — Progress Notes (Signed)
Patient ID: Tony Cruz, male   DOB: [birth year], 79 y.o.   MRN: [id number] HPI: Tony Cruz is a 79 year old male with a past medical history of OSA, hypertension, history of pacemaker implant, IBS, diverticulosis, anal fissure, and Barrett's esophagus who is seen in consultation at the request of Dr. Kriste Basque for evaluation of screening colonoscopy and Barrett's surveillance. He is here alone today. He reports he is feeling very well. He denies abdominal pain today. He does continue on omeprazole 20 mg twice daily. With this medication he does not have heartburn, nausea or vomiting. He reports his appetite is good and his eating well. His last upper endoscopy was approximately 3 years ago. He recalls being told that he has Barrett's and then at other times that he may not have Barrett's and so he is somewhat confused as to how this stands. He was seen by urology recently for a separate issue and told that he may have a anal canal polyp and he had this looked at. He reports occasionally he does have hard stool and at times has had an anal fissure. This has not been an issue of late. He does report at times he'll see bright red blood per rectum with wiping, which he has attributed to fissure.  He is using Metamucil to soften the stool. He does have a long-standing history of irritable bowel which are him to the urgent loose stools. Occasionally he has lower abdominal cramping associated with his loose stools. He has had colonoscopy in the past and notes indicate the last was in 2008.  Past Medical History  Diagnosis Date  . Obstructive sleep apnea   . hypertension   . Syncope and collapse   . Cardiac pacemaker-MDT   . Other abnormal glucose   . Overweight(278.02)   . Esophageal reflux   . Diverticulosis of colon (without mention of hemorrhage)   . Irritable bowel syndrome   . Hypertrophy of prostate with urinary obstruction and other lower urinary tract symptoms (LUTS)   . Osteoarthrosis, unspecified  whether generalized or localized, unspecified site   . Backache, unspecified   . Unspecified hereditary and idiopathic peripheral neuropathy   . Anal fissure     Past Surgical History  Procedure Laterality Date  . Appendectomy    . Cholecystectomy    . Tonsillectomy    . Pacemaker placement    . Skin cancer removed  12/2012    removed from his face--Dr. Irene Limbo    Current Outpatient Prescriptions  Medication Sig Dispense Refill  . acetaminophen (TYLENOL) 500 MG tablet Take 1,000 mg by mouth every 6 (six) hours as needed for pain.      Marland Kitchen aspirin 81 MG tablet Take 81 mg by mouth daily.        . calcium carbonate (OS-CAL) 600 MG TABS Take 600 mg by mouth daily.        . Cholecalciferol (VITAMIN D) 1000 UNITS capsule Take 1,000 Units by mouth daily.        . finasteride (PROSCAR) 5 MG tablet Take 1 tablet by mouth daily.      . fish oil-omega-3 fatty acids 1000 MG capsule Take 1 g by mouth daily.      . Lecithin 1200 MG CAPS Take 1 capsule by mouth daily.        Marland Kitchen loratadine (CLARITIN) 10 MG tablet Take 10 mg by mouth daily.        Marland Kitchen losartan-hydrochlorothiazide (HYZAAR) 100-12.5 MG per tablet Take 1 tablet by mouth  daily.  90 tablet  3  . Multiple Vitamin (MULTIVITAMIN) capsule Take 1 capsule by mouth daily.        . Naproxen Sodium (ALEVE) 220 MG CAPS Take 1 capsule by mouth daily.        Marland Kitchen nystatin-triamcinolone (MYCOLOG II) cream as needed.      Marland Kitchen omeprazole (PRILOSEC) 20 MG capsule TAKE 1 CAPSULE TWICE DAILY  180 capsule  2  . Tamsulosin HCl (FLOMAX) 0.4 MG CAPS Take 0.4 mg by mouth daily.        . traMADol (ULTRAM) 50 MG tablet Take 50 mg by mouth every 8 (eight) hours as needed for pain.       Marland Kitchen MOVIPREP 100 G SOLR Use per prep instruction  1 kit  0   No current facility-administered medications for this visit.    Allergies  Allergen Reactions  . Shellfish Allergy     Sensitive to shellfish    Family History  Problem Relation Age of Onset  . Coronary artery disease  Father     bladder cancer  . Coronary artery disease Mother   . Diabetes Sister     2 sisters    History  Substance Use Topics  . Smoking status: Never Smoker   . Smokeless tobacco: Never Used  . Alcohol Use: Yes    ROS: As per history of present illness, otherwise negative  BP 122/86  Pulse 73  Ht 5\' 9"  (1.753 m)  Wt 215 lb 3.2 oz (97.614 kg)  BMI 31.76 kg/m2  SpO2 97% Constitutional: Well-developed and well-nourished. No distress. HEENT: Normocephalic and atraumatic. Oropharynx is clear and moist. No oropharyngeal exudate. Conjunctivae are normal.  No scleral icterus. Neck: Neck supple. Trachea midline. Cardiovascular: Normal rate, regular rhythm and intact distal pulses.  Pulmonary/chest: Effort normal and breath sounds normal. No wheezing, rales or rhonchi. Abdominal: Soft, nontender, nondistended. Bowel sounds active throughout.  Extremities: no clubbing, cyanosis, or edema Neurological: Alert and oriented to person place and time. Skin: Skin is warm and dry. No rashes noted. Psychiatric: Normal mood and affect. Behavior is normal.  RELEVANT LABS AND IMAGING: CBC    Component Value Date/Time   WBC 6.8 01/25/2013 1001   RBC 4.44 01/25/2013 1001   HGB 13.9 01/25/2013 1001   HCT 40.9 01/25/2013 1001   PLT 252.0 01/25/2013 1001   MCV 92.1 01/25/2013 1001   MCHC 34.0 01/25/2013 1001   RDW 14.9* 01/25/2013 1001   LYMPHSABS 2.2 01/25/2013 1001   MONOABS 0.7 01/25/2013 1001   EOSABS 0.6 01/25/2013 1001   BASOSABS 0.0 01/25/2013 1001    CMP     Component Value Date/Time   NA 135 01/25/2013 1001   K 4.5 01/25/2013 1001   CL 99 01/25/2013 1001   CO2 27 01/25/2013 1001   GLUCOSE 114* 01/25/2013 1001   BUN 12 01/25/2013 1001   CREATININE 0.9 01/25/2013 1001   CALCIUM 9.3 01/25/2013 1001   PROT 7.2 01/25/2013 1001   ALBUMIN 4.3 01/25/2013 1001   AST 26 01/25/2013 1001   ALT 30 01/25/2013 1001   ALKPHOS 55 01/25/2013 1001   BILITOT 1.0 01/25/2013 1001   GFRNONAA 85.41 03/22/2010 1530    GFRAA  Value: >60        The eGFR has been calculated using the MDRD equation. This calculation has not been validated in all clinical situations. eGFR's persistently <60 mL/min signify possible Chronic Kidney Disease. 08/15/2008 2305   Colonoscopy to 09/19/2006 -- Dr. Sherin Quarry -- left sided diverticulosis,  otherwise unremarkable colon. Normal retroflexion. Exam to the cecum prep excellent. Colonoscopy 08/28/2001 -- Dr. Jarold Motto 00 diverticulosis and internal hemorrhoids  Pathology 12/12/2009 -- distal esophagus- gastroesophageal Junction mucosa with focal intestinal metaplasia, consistent with Barrett's esophagus. No dysplasia or malignancy (EGD irregular Z line 40 cm from the esophagus) few less than 5 mm benign fundic gland polyps, normal duodenum  Pathology 08/22/1999 -- distal esophagus - focal intestinal metaplasia consistent with Barrett's esophagus, no dysplasia or malignancy  Pathology 08/28/2001 -- distal esophagus no intestinal metaplasia, dysplasia or malignancy\ Pathology 09/22/2006 -- benign esophageal mucosa and multiple fragments of gastric body type mucosa no intestinal metaplasia or dysplasia   ASSESSMENT/PLAN: 79 year old male with a past medical history of OSA, hypertension, history of pacemaker implant, IBS, diverticulosis, anal fissure, and Barrett's esophagus who is seen in consultation at the request of Dr. Kriste Basque for evaluation of screening colonoscopy and Barrett's surveillance.  1.  Barrett's esophagus -- I have records of her prior upper endoscopies, the last of which was 3-1/2 yrs ago.  The last endoscopy did show Barrett's esophagus without dysplasia and based on this, I recommended surveillance endoscopy at this time. We've also discussed current recommendations which reported definite use of PPI. Based on this recommendation he will continue omeprazole 40 mg daily. After discussion of risks and benefits he is agreeable to proceed with upper endoscopy.  2.  ? Anal  polyp/CRC screening -- the patient is a student repeat colorectal cancer screening at this time. We discussed rectal examination today without recurrent colonoscopy, but he prefers proceeding with full colonoscopy at the time of his upper endoscopy. This will very likely be his last colorectal cancer screening, assuming the examination is normal and no adenomatous polyps were discovered.  We discussed the risks and benefits of colonoscopy and he is agreeable to proceed. This will be performed on the same day as his upper endoscopy. We discussed the rectal exam would be performed at the time of his colonoscopy to examine the abnormal perianal lesion described to him by his urologist.

## 2013-05-25 NOTE — Patient Instructions (Signed)
You have been scheduled for a colonoscopy/Endoscopy with propofol. Please follow written instructions given to you at your visit today.  Please pick up your prep kit at the pharmacy within the next 1-3 days. If you use inhalers (even only as needed), please bring them with you on the day of your procedure. Your physician has requested that you go to www.startemmi.com and enter the access code given to you at your visit today. This web site gives a general overview about your procedure. However, you should still follow specific instructions given to you by our office regarding your preparation for the procedure.   We have sent the following medications to your pharmacy for you to pick up at your convenience: Moviprep                                               We are excited to introduce MyChart, a new best-in-class service that provides you online access to important information in your electronic medical record. We want to make it easier for you to view your health information - all in one secure location - when and where you need it. We expect MyChart will enhance the quality of care and service we provide.  When you register for MyChart, you can:    View your test results.    Request appointments and receive appointment reminders via email.    Request medication renewals.    View your medical history, allergies, medications and immunizations.    Communicate with your physician's office through a password-protected site.    Conveniently print information such as your medication lists.  To find out if MyChart is right for you, please talk to a member of our clinical staff today. We will gladly answer your questions about this free health and wellness tool.  If you are age 18 or older and want a member of your family to have access to your record, you must provide written consent by completing a proxy form available at our office. Please speak to our clinical staff about guidelines  regarding accounts for patients younger than age 18.  As you activate your MyChart account and need any technical assistance, please call the MyChart technical support line at [phone] ([phone]) or email your question to [email or web address]. If you email your question(s), please include your name, a return phone number and the best time to reach you.  If you have non-urgent health-related questions, you can send a message to our office through MyChart at [email or web address]. If you have a medical emergency, call 911.  Thank you for using MyChart as your new health and wellness resource!   MyChart licensed from [employer],  1999-2010. Patents Pending.     

## 2013-05-26 LAB — REMOTE PACEMAKER DEVICE
AL IMPEDENCE PM: 646 Ohm
ATRIAL PACING PM: 0
BATTERY VOLTAGE: 2.74 V

## 2013-06-01 DIAGNOSIS — I495 Sick sinus syndrome: Secondary | ICD-10-CM | POA: Insufficient documentation

## 2013-06-03 ENCOUNTER — Other Ambulatory Visit: Payer: Self-pay

## 2013-06-04 ENCOUNTER — Encounter: Payer: Self-pay | Admitting: *Deleted

## 2013-06-08 ENCOUNTER — Encounter: Payer: Self-pay | Admitting: Pulmonary Disease

## 2013-06-14 ENCOUNTER — Encounter: Payer: Self-pay | Admitting: Internal Medicine

## 2013-06-15 ENCOUNTER — Encounter: Payer: Medicare Other | Admitting: Internal Medicine

## 2013-06-22 ENCOUNTER — Ambulatory Visit (AMBULATORY_SURGERY_CENTER): Payer: Medicare Other | Admitting: Internal Medicine

## 2013-06-22 ENCOUNTER — Encounter: Payer: Self-pay | Admitting: Internal Medicine

## 2013-06-22 VITALS — BP 153/78 | HR 70 | Temp 97.1°F | Resp 22 | Ht 69.0 in | Wt 215.0 lb

## 2013-06-22 DIAGNOSIS — Z1211 Encounter for screening for malignant neoplasm of colon: Secondary | ICD-10-CM

## 2013-06-22 DIAGNOSIS — K2270 Barrett's esophagus without dysplasia: Secondary | ICD-10-CM

## 2013-06-22 DIAGNOSIS — D126 Benign neoplasm of colon, unspecified: Secondary | ICD-10-CM

## 2013-06-22 DIAGNOSIS — K2990 Gastroduodenitis, unspecified, without bleeding: Secondary | ICD-10-CM

## 2013-06-22 DIAGNOSIS — K602 Anal fissure, unspecified: Secondary | ICD-10-CM

## 2013-06-22 DIAGNOSIS — K625 Hemorrhage of anus and rectum: Secondary | ICD-10-CM

## 2013-06-22 DIAGNOSIS — K2970 Gastritis, unspecified, without bleeding: Secondary | ICD-10-CM

## 2013-06-22 MED ORDER — SODIUM CHLORIDE 0.9 % IV SOLN: 500.0000 mL | INTRAVENOUS | Status: DC

## 2013-06-22 NOTE — Patient Instructions (Signed)

## 2013-06-22 NOTE — Op Note (Signed)
[facility] [address] [place], [address]   ENDOSCOPY PROCEDURE REPORT  PATIENT: Tony, Cruz  MR#: [id number] BIRTHDATE: 03-24-1934 , 79  yrs. old GENDER: Male ENDOSCOPIST: Beverley Fiedler, MD REFERRED BY:  Alroy Dust, M.D. PROCEDURE DATE:  06/22/2013 PROCEDURE:  EGD w/ biopsy ASA CLASS:     Class III INDICATIONS:  history of Barrett's esophagus. MEDICATIONS: MAC sedation, administered by CRNA and propofol (Diprivan) 200mg  IV TOPICAL ANESTHETIC: Cetacaine Spray  DESCRIPTION OF PROCEDURE: After the risks benefits and alternatives of the procedure were thoroughly explained, informed consent was obtained.  The LB [id number] endoscope was introduced through the mouth and advanced to the second portion of the duodenum. Without limitations.  The instrument was slowly withdrawn as the mucosa was fully examined.     ESOPHAGUS: There was evidence of suspected short-segment Barrett's esophagus 39 cm from the incisors. The Z-line was irregular with small islands of salmon-colored mucosa. C0 M1-2.  Multiple biopsies were performed.  Sample sent for histology.  STOMACH: Mild and congested gastritis (inflammation) was found in the gastric body and gastric antrum.  Multiple biopsies were performed using cold forceps.  Sample sent for histology. Multiple sessile polyps ranging between 3-110mm in size were found in the cardia, gastric fundus, and gastric body.  Multiple biopsies was performed of several polyps using cold forceps.  DUODENUM: The duodenal mucosa showed no abnormalities in the bulb and second portion of the duodenum.  Retroflexed views revealed no abnormalities.     The scope was then withdrawn from the patient and the procedure completed.  COMPLICATIONS: There were no complications.  ENDOSCOPIC IMPRESSION: 1.   There was evidence of suspected Barrett's esophagus; multiple biopsies 2.   Gastritis (inflammation) was found in the gastric  body and gastric antrum; multiple biopsies 3.   Multiple sessile polyps ranging between 3-78mm in size were found in the cardia, gastric fundus, and gastric body; biopsies 4.   The duodenal mucosa showed no abnormalities in the bulb and second portion of the duodenum    RECOMMENDATIONS: 1.  Await pathology results 2.  Continue taking your PPI (antiacid medicine) once daily.  It is best to be taken 20-30 minutes prior to breakfast meal.  REPEAT EXAM: for EGD pending biopsy results.  eSigned:  Beverley Fiedler, MD 06/22/2013 2:46 PM   CC:The Patient and Michele Mcalpine, MD  PATIENT NAME:  Tony, Cruz MR#: [id number]

## 2013-06-22 NOTE — Progress Notes (Signed)
Patient did not experience any of the following events: a burn prior to discharge; a fall within the facility; wrong site/side/patient/procedure/implant event; or a hospital transfer or hospital admission upon discharge from the facility. (G8907) Patient did not have preoperative order for IV antibiotic SSI prophylaxis. (G8918)  

## 2013-06-22 NOTE — Op Note (Signed)
[facility] [address] [place], [address]   COLONOSCOPY PROCEDURE REPORT  PATIENT: Tony Cruz, Tony Cruz  MR#: [id number] BIRTHDATE: April 16, 1934 , 79  yrs. old GENDER: Male ENDOSCOPIST: Beverley Fiedler, MD REFERRED XB:JYNWG Kriste Basque, M.D. PROCEDURE DATE:  06/22/2013 PROCEDURE:   Colonoscopy with cold biopsy polypectomy First Screening Colonoscopy - Avg.  risk and is 50 yrs.  old or older - No.  Prior Negative Screening - Now for repeat screening. N/A  History of Adenoma - Now for follow-up colonoscopy & has been > or = to 3 yrs.  N/A  Polyps Removed Today? Yes. ASA CLASS:   Class III INDICATIONS: recent abnormal rectal exam (?rectal polyp vs. anal canal polyp), screening MEDICATIONS: MAC sedation, administered by CRNA and propofol (Diprivan) 300mg  IV  DESCRIPTION OF PROCEDURE:   After the risks benefits and alternatives of the procedure were thoroughly explained, informed consent was obtained.  A digital rectal exam revealed hemorrhoids but no anal canal or rectal polyp palpable.   The LB [id number]  endoscope was introduced through the anus and advanced to the cecum, which was identified by both the appendix and ileocecal valve. No adverse events experienced.   The quality of the prep was good, using MoviPrep  The instrument was then slowly withdrawn as the colon was fully examined.   COLON FINDINGS: A sessile polyp measuring 5 mm in size was found at the cecum.  A polypectomy was performed with cold forceps.  The resection was complete and the polyp tissue was completely retrieved.   Moderate diverticulosis was noted in the ascending colon, descending colon, and sigmoid colon.  Retroflexed views revealed internal hemorrhoids. The time to cecum=5 minutes 15 seconds.  Withdrawal time=9 minutes 48 seconds.  The scope was withdrawn and the procedure completed. COMPLICATIONS: There were no complications.  ENDOSCOPIC IMPRESSION: 1.   Sessile polyp measuring 5  mm in size was found at the cecum; polypectomy was performed with cold forceps 2.   Moderate diverticulosis was noted in the ascending colon, descending colon, and sigmoid colon  RECOMMENDATIONS: 1.  Await biopsy results 2.  High fiber diet 3.  Timing of repeat colonoscopy will be determined by pathology findings. 4.  You will receive a letter within 1-2 weeks with the results of your biopsy as well as final recommendations.  Please call my office if you have not received a letter after 3 weeks.   eSigned:  Beverley Fiedler, MD 06/22/2013 2:53 PM  cc: The Patient and Michele Mcalpine, MD

## 2013-06-22 NOTE — Progress Notes (Signed)
Called to room to assist during endoscopic procedure.  Patient ID and intended procedure confirmed with present staff. Received instructions for my participation in the procedure from the performing physician.  

## 2013-06-23 ENCOUNTER — Telehealth: Payer: Self-pay | Admitting: *Deleted

## 2013-06-23 NOTE — Telephone Encounter (Signed)
  Follow up Call-  Call back number 06/22/2013  Post procedure Call Back phone  # [phone]  Permission to leave phone message Yes     Patient questions:  Do you have a fever, pain , or abdominal swelling? no Pain Score  0 *  Have you tolerated food without any problems? yes  Have you been able to return to your normal activities? yes  Do you have any questions about your discharge instructions: Diet   no Medications  no Follow up visit  no  Do you have questions or concerns about your Care? no  Actions: * If pain score is 4 or above: No action needed, pain <4.

## 2013-06-29 ENCOUNTER — Encounter: Payer: Self-pay | Admitting: Internal Medicine

## 2013-07-02 ENCOUNTER — Ambulatory Visit (INDEPENDENT_AMBULATORY_CARE_PROVIDER_SITE_OTHER): Payer: Medicare Other | Admitting: Internal Medicine

## 2013-07-02 ENCOUNTER — Encounter: Payer: Self-pay | Admitting: Internal Medicine

## 2013-07-02 VITALS — BP 141/87 | HR 63 | Ht 69.0 in | Wt 213.8 lb

## 2013-07-02 DIAGNOSIS — I10 Essential (primary) hypertension: Secondary | ICD-10-CM

## 2013-07-02 DIAGNOSIS — I495 Sick sinus syndrome: Secondary | ICD-10-CM

## 2013-07-02 DIAGNOSIS — R55 Syncope and collapse: Secondary | ICD-10-CM

## 2013-07-02 DIAGNOSIS — Z950 Presence of cardiac pacemaker: Secondary | ICD-10-CM

## 2013-07-02 LAB — MDC_IDC_ENUM_SESS_TYPE_INCLINIC
Battery Voltage: 2.74 V
Brady Statistic AP VS Percent: 0 %
Brady Statistic AS VS Percent: 100 %
Date Time Interrogation Session: 20141205100239
Lead Channel Impedance Value: 754 Ohm
Lead Channel Pacing Threshold Amplitude: 1 V
Lead Channel Pacing Threshold Pulse Width: 0.4 ms
Lead Channel Pacing Threshold Pulse Width: 0.4 ms
Lead Channel Setting Pacing Amplitude: 2 V
Lead Channel Setting Pacing Amplitude: 2.5 V
Lead Channel Setting Sensing Sensitivity: 2 mV

## 2013-07-02 MED ORDER — LOSARTAN POTASSIUM 100 MG PO TABS: 100.0000 mg | ORAL_TABLET | Freq: Every day | ORAL | Status: DC

## 2013-07-02 NOTE — Patient Instructions (Addendum)
Your physician has recommended you make the following change in your medication:  1) Stop Hyzaar 2) Start Losartan 100 mg daily (handwritten prescription given to patient)  Remote monitoring is used to monitor your Pacemaker of ICD from home. This monitoring reduces the number of office visits required to check your device to one time per year. It allows [place] to keep an eye on the functioning of your device to ensure it is working properly. You are scheduled for a device check from home on 10/05/2013. You may send your transmission at any time that day. If you have a wireless device, the transmission will be sent automatically. After your physician reviews your transmission, you will receive a postcard with your next transmission date.  Your physician wants you to follow-up in: one year with Dr. Graciela Husbands.  You will receive a reminder letter in the mail two months in advance. If you don't receive a letter, please call our office to schedule the follow-up appointment.

## 2013-07-02 NOTE — Assessment & Plan Note (Signed)
As above.

## 2013-07-02 NOTE — Assessment & Plan Note (Signed)
He has recurrent syncope most consistent with vasovagal syncope. The finasteride may be activated. His diuretics may also. We will change his antihypertensive to eliminate the HCTZ. I've asked him to follow up with his urologist concerning the finasteride.  We will make his rate drop response more aggressive with a 5 minute duration in the 110 beats per minute heart rate. I would anticipate device generator replacement we would use a Biotronik CLS device.

## 2013-07-02 NOTE — Progress Notes (Signed)
Patient Care Team: Michele Mcalpine, MD as PCP - General   HPI  Tony Cruz is a 79 y.o. male is seen in followup for syncope secondary to intermittent sinus node dysfunction for which a pacemaker was previously implanted and changed out 11/08. He has had intercurrent syncope thought to be vasovagal   And he has had recurrent episodes, these were postprandial. They were associated with a recognizable prodrome. His rate drop response on his pacemaker activated.  He has been on finasteride from out one year. This is been associated with improvement in urination as well as a decrease in his PSA   Past Medical History  Diagnosis Date  . Obstructive sleep apnea   . hypertension   . Syncope and collapse   . Pacemaker-MDT     Change out 2008  . Other abnormal glucose   . Overweight(278.02)   . Esophageal reflux   . Diverticulosis of colon (without mention of hemorrhage)   . Irritable bowel syndrome   . Hypertrophy of prostate with urinary obstruction and other lower urinary tract symptoms (LUTS)   . Osteoarthrosis, unspecified whether generalized or localized, unspecified site   . Backache, unspecified   . Unspecified hereditary and idiopathic peripheral neuropathy   . Anal fissure     Past Surgical History  Procedure Laterality Date  . Appendectomy    . Cholecystectomy    . Tonsillectomy    . Pacemaker placement    . Skin cancer removed  12/2012    removed from his face--Dr. Irene Limbo    Current Outpatient Prescriptions  Medication Sig Dispense Refill  . acetaminophen (TYLENOL) 500 MG tablet Take 1,000 mg by mouth every 6 (six) hours as needed for pain.      Marland Kitchen aspirin 81 MG tablet Take 81 mg by mouth daily.        . calcium carbonate (OS-CAL) 600 MG TABS Take 600 mg by mouth daily.        . Cholecalciferol (VITAMIN D) 1000 UNITS capsule Take 1,000 Units by mouth daily.        . finasteride (PROSCAR) 5 MG tablet Take 1 tablet by mouth daily.      . fish oil-omega-3  fatty acids 1000 MG capsule Take 1 g by mouth daily.      Marland Kitchen HYDROcodone-acetaminophen (NORCO/VICODIN) 5-325 MG per tablet As directed      . Lecithin 1200 MG CAPS Take 1 capsule by mouth daily.        Marland Kitchen loratadine (CLARITIN) 10 MG tablet Take 10 mg by mouth daily.        Marland Kitchen losartan-hydrochlorothiazide (HYZAAR) 100-12.5 MG per tablet Take 1 tablet by mouth daily.  90 tablet  3  . Multiple Vitamin (MULTIVITAMIN) capsule Take 1 capsule by mouth daily.        . Naproxen Sodium (ALEVE) 220 MG CAPS Take 1 capsule by mouth daily.        Marland Kitchen omeprazole (PRILOSEC) 20 MG capsule TAKE 1 CAPSULE TWICE DAILY  180 capsule  2  . Tamsulosin HCl (FLOMAX) 0.4 MG CAPS Take 0.4 mg by mouth daily.        . traMADol (ULTRAM) 50 MG tablet Take 50 mg by mouth every 8 (eight) hours as needed for pain.        No current facility-administered medications for this visit.    Allergies  Allergen Reactions  . Shellfish Allergy     Sensitive to shellfish    Review of  Systems negative except from HPI and PMH  Physical Exam BP 141/87  Pulse 63  Ht 5\' 9"  (1.753 m)  Wt 213 lb 12.8 oz (96.979 kg)  BMI 31.56 kg/m2 Well developed and well nourished in no acute distress HENT normal E scleral and icterus clear Neck Supple JVP flat; carotids brisk and full Clear to ausculation  Regular rate and rhythm, no murmurs gallops or rub Soft with active bowel sounds No clubbing cyanosis none Edema Alert and oriented, grossly normal motor and sensory function Skin Warm and Dry    Assessment and  Plan A ECG demonstrates sinus rhythm at 63 Exline intervals 16/14/42 Bundle branch block Otherwise normal

## 2013-07-12 ENCOUNTER — Telehealth: Payer: Self-pay | Admitting: Pulmonary Disease

## 2013-07-12 NOTE — Telephone Encounter (Signed)
LMTCB

## 2013-07-13 MED ORDER — PANTOPRAZOLE SODIUM 40 MG PO TBEC: 40.0000 mg | DELAYED_RELEASE_TABLET | Freq: Every day | ORAL | Status: DC

## 2013-07-13 MED ORDER — LOSARTAN POTASSIUM-HCTZ 100-12.5 MG PO TABS: 1.0000 | ORAL_TABLET | Freq: Every day | ORAL | Status: DC

## 2013-07-13 NOTE — Telephone Encounter (Signed)
rx have both been printed out and will mail to the pt today.  thanks

## 2013-07-13 NOTE — Telephone Encounter (Signed)
Pt is aware that his rx's will be mailed to him today.

## 2013-07-13 NOTE — Telephone Encounter (Signed)
Pt states that he sent a letter about his medications and changes that needed to be made for the upcoming year due to his coverage changes. Please advise if you have received this. Carron Curie, CMA

## 2013-09-18 ENCOUNTER — Encounter (HOSPITAL_COMMUNITY): Payer: Self-pay | Admitting: Emergency Medicine

## 2013-09-18 ENCOUNTER — Emergency Department (HOSPITAL_COMMUNITY)
Admission: EM | Admit: 2013-09-18 | Discharge: 2013-09-18 | Disposition: A | Discharge status: Home / Self Care | Payer: Medicare Other | Attending: Emergency Medicine | Admitting: Emergency Medicine

## 2013-09-18 ENCOUNTER — Emergency Department (HOSPITAL_COMMUNITY): Payer: Medicare Other

## 2013-09-18 DIAGNOSIS — Z950 Presence of cardiac pacemaker: Secondary | ICD-10-CM | POA: Insufficient documentation

## 2013-09-18 DIAGNOSIS — I10 Essential (primary) hypertension: Secondary | ICD-10-CM | POA: Insufficient documentation

## 2013-09-18 DIAGNOSIS — Z7982 Long term (current) use of aspirin: Secondary | ICD-10-CM | POA: Insufficient documentation

## 2013-09-18 DIAGNOSIS — N138 Other obstructive and reflux uropathy: Secondary | ICD-10-CM | POA: Insufficient documentation

## 2013-09-18 DIAGNOSIS — K219 Gastro-esophageal reflux disease without esophagitis: Secondary | ICD-10-CM | POA: Insufficient documentation

## 2013-09-18 DIAGNOSIS — E663 Overweight: Secondary | ICD-10-CM | POA: Insufficient documentation

## 2013-09-18 DIAGNOSIS — Y9289 Other specified places as the place of occurrence of the external cause: Secondary | ICD-10-CM | POA: Insufficient documentation

## 2013-09-18 DIAGNOSIS — S06309A Unspecified focal traumatic brain injury with loss of consciousness of unspecified duration, initial encounter: Secondary | ICD-10-CM

## 2013-09-18 DIAGNOSIS — W1809XA Striking against other object with subsequent fall, initial encounter: Secondary | ICD-10-CM | POA: Insufficient documentation

## 2013-09-18 DIAGNOSIS — M1990 Unspecified osteoarthritis, unspecified site: Secondary | ICD-10-CM | POA: Insufficient documentation

## 2013-09-18 DIAGNOSIS — IMO0002 Reserved for concepts with insufficient information to code with codable children: Secondary | ICD-10-CM | POA: Insufficient documentation

## 2013-09-18 DIAGNOSIS — Z8669 Personal history of other diseases of the nervous system and sense organs: Secondary | ICD-10-CM | POA: Insufficient documentation

## 2013-09-18 DIAGNOSIS — Z79899 Other long term (current) drug therapy: Secondary | ICD-10-CM | POA: Insufficient documentation

## 2013-09-18 DIAGNOSIS — Y939 Activity, unspecified: Secondary | ICD-10-CM | POA: Insufficient documentation

## 2013-09-18 DIAGNOSIS — S0990XA Unspecified injury of head, initial encounter: Secondary | ICD-10-CM

## 2013-09-18 DIAGNOSIS — N401 Enlarged prostate with lower urinary tract symptoms: Secondary | ICD-10-CM | POA: Insufficient documentation

## 2013-09-18 DIAGNOSIS — Z791 Long term (current) use of non-steroidal anti-inflammatories (NSAID): Secondary | ICD-10-CM | POA: Insufficient documentation

## 2013-09-18 DIAGNOSIS — W010XXA Fall on same level from slipping, tripping and stumbling without subsequent striking against object, initial encounter: Secondary | ICD-10-CM | POA: Insufficient documentation

## 2013-09-18 MED ORDER — OXYCODONE-ACETAMINOPHEN 5-325 MG PO TABS
1.0000 | ORAL_TABLET | ORAL | Status: DC | PRN
Start: 2013-09-18 — End: 2013-09-30

## 2013-09-18 NOTE — ED Notes (Signed)
Per pt, fell this am and was found by neighbor-hit back of head and does not know how long he was out-does not remember incident- takes ASA daily-small laceration-c/o headache

## 2013-09-18 NOTE — Discharge Instructions (Signed)
Concussion, Adult °A concussion, or closed-head injury, is a brain injury caused by a direct blow to the head or by a quick and sudden movement (jolt) of the head or neck. Concussions are usually not life-threatening. Even so, the effects of a concussion can be serious. If you have had a concussion before, you are more likely to experience concussion-like symptoms after a direct blow to the head.  °CAUSES  °· Direct blow to the head, such as from running into another player during a soccer game, being hit in a fight, or hitting your head on a hard surface. °· A jolt of the head or neck that causes the brain to move back and forth inside the skull, such as in a car crash. °SIGNS AND SYMPTOMS  °The signs of a concussion can be hard to notice. Early on, they may be missed by you, family members, and health care providers. You may look fine but act or feel differently. °Symptoms are usually temporary, but they may last for days, weeks, or even longer. Some symptoms may appear right away while others may not show up for hours or days. Every head injury is different. Symptoms include:  °· Mild to moderate headaches that will not go away. °· A feeling of pressure inside your head.  °· Having more trouble than usual:   °· Learning or remembering things you have heard. °· Answering questions.  °· Paying attention or concentrating.   °· Organizing daily tasks.   °· Making decisions and solving problems.   °· Slowness in thinking, acting or reacting, speaking, or reading.   °· Getting lost or being easily confused.   °· Feeling tired all the time or lacking energy (fatigued).   °· Feeling drowsy.   °· Sleep disturbances.   °· Sleeping more than usual.   °· Sleeping less than usual.   °· Trouble falling asleep.   °· Trouble sleeping (insomnia).   °· Loss of balance or feeling lightheaded or dizzy.   °· Nausea or vomiting.   °· Numbness or tingling.   °· Increased sensitivity to:   °· Sounds.   °· Lights.   °· Distractions.    °· Vision problems or eyes that tire easily.   °· Diminished sense of taste or smell.   °· Ringing in the ears.   °· Mood changes such as feeling sad or anxious.   °· Becoming easily irritated or angry for little or no reason.   °· Lack of motivation. °· Seeing or hearing things other people do not see or hear (hallucinations). °DIAGNOSIS  °Your health care provider can usually diagnose a concussion based on a description of your injury and symptoms. He or she will ask whether you passed out (lost consciousness) and whether you are having trouble remembering events that happened right before and during your injury.  °Your evaluation might include:  °· A brain scan to look for signs of injury to the brain. Even if the test shows no injury, you may still have a concussion.   °· Blood tests to be sure other problems are not present. °TREATMENT  °· Concussions are usually treated in an emergency department, in urgent care, or at a clinic. You may need to stay in the hospital overnight for further treatment.   °· Tell your health care provider if you are taking any medicines, including prescription medicines, over-the-counter medicines, and natural remedies. Some medicines, such as blood thinners (anticoagulants) and aspirin, may increase the chance of complications. Also tell your health care provider whether you have had alcohol or are taking illegal drugs. This information may affect treatment. °· Your health care provider will send you   home with important instructions to follow.  How fast you will recover from a concussion depends on many factors. These factors include how severe your concussion is, what part of your brain was injured, your age, and how healthy you were before the concussion.  Most people with mild injuries recover fully. Recovery can take time. In general, recovery is slower in older persons. Also, persons who have had a concussion in the past or have other medical problems may find that it  takes longer to recover from their current injury. HOME CARE INSTRUCTIONS  General Instructions  Carefully follow the directions your health care provider gave you.  Only take over-the-counter or prescription medicines for pain, discomfort, or fever as directed by your health care provider.  Take only those medicines that your health care provider has approved.  Do not drink alcohol until your health care provider says you are well enough to do so. Alcohol and certain other drugs may slow your recovery and can put you at risk of further injury.  If it is harder than usual to remember things, write them down.  If you are easily distracted, try to do one thing at a time. For example, do not try to watch TV while fixing dinner.  Talk with family members or close friends when making important decisions.  Keep all follow-up appointments. Repeated evaluation of your symptoms is recommended for your recovery.  Watch your symptoms and tell others to do the same. Complications sometimes occur after a concussion. Older adults with a brain injury may have a higher risk of serious complications such as of a blood clot on the brain.  Tell your teachers, school nurse, school counselor, coach, athletic trainer, or work Freight forwarder about your injury, symptoms, and restrictions. Tell them about what you can or cannot do. They should watch for:   Increased problems with attention or concentration.   Increased difficulty remembering or learning new information.   Increased time needed to complete tasks or assignments.   Increased irritability or decreased ability to cope with stress.   Increased symptoms.   Rest. Rest helps the brain to heal. Make sure you:  Get plenty of sleep at night. Avoid staying up late at night.  Keep the same bedtime hours on weekends and weekdays.  Rest during the day. Take daytime naps or rest breaks when you feel tired.  Limit activities that require a lot of  thought or concentration. These includes   Doing homework or job-related work.   Watching TV.   Working on the computer.  Avoid any situation where there is potential for another head injury (football, hockey, soccer, basketball, martial arts, downhill snow sports and horseback riding). Your condition will get worse every time you experience a concussion. You should avoid these activities until you are evaluated by the appropriate follow-up caregivers. Returning To Your Regular Activities You will need to return to your normal activities slowly, not all at once. You must give your body and brain enough time for recovery.  Do not return to sports or other athletic activities until your health care provider tells you it is safe to do so.  Ask your health care provider when you can drive, ride a bicycle, or operate heavy machinery. Your ability to react may be slower after a brain injury. Never do these activities if you are dizzy.  Ask your health care provider about when you can return to work or school. Preventing Another Concussion It is very important to avoid another  brain injury, especially before you have recovered. In rare cases, another injury can lead to permanent brain damage, brain swelling, or death. The risk of this is greatest during the first 7 10 days after a head injury. Avoid injuries by:   Wearing a seat belt when riding in a car.   Drinking alcohol only in moderation.   Wearing a helmet when biking, skiing, skateboarding, skating, or doing similar activities.  Avoiding activities that could lead to a second concussion, such as contact or recreational sports, until your health care provider says it is OK.  Taking safety measures in your home.   Remove clutter and tripping hazards from floors and stairways.   Use grab bars in bathrooms and handrails by stairs.   Place non-slip mats on floors and in bathtubs.   Improve lighting in dim areas. SEEK MEDICAL  CARE IF:   You have increased problems paying attention or concentrating.   You have increased difficulty remembering or learning new information.   You need more time to complete tasks or assignments than before.   You have increased irritability or decreased ability to cope with stress.  You have more symptoms than before. Seek medical care if you have any of the following symptoms for more than 2 weeks after your injury:   Lasting (chronic) headaches.   Dizziness or balance problems.   Nausea.  Vision problems.   Increased sensitivity to noise or light.   Depression or mood swings.   Anxiety or irritability.   Memory problems.   Difficulty concentrating or paying attention.   Sleep problems.   Feeling tired all the time. SEEK IMMEDIATE MEDICAL CARE IF:   You have severe or worsening headaches. These may be a sign of a blood clot in the brain.  You have weakness (even if only in one hand, leg, or part of the face).  You have numbness.  You have decreased coordination.   You vomit repeatedly.  You have increased sleepiness.  One pupil is larger than the other.   You have convulsions.   You have slurred speech.   You have increased confusion. This may be a sign of a blood clot in the brain.  You have increased restlessness, agitation, or irritability.   You are unable to recognize people or places.   You have neck pain.   It is difficult to wake you up.   You have unusual behavior changes.   You lose consciousness. MAKE SURE YOU:   Understand these instructions.  Will watch your condition.  Will get help right away if you are not doing well or get worse. Document Released: 10/05/2003 Document Revised: 03/17/2013 Document Reviewed: 02/04/2013 [facility] Patient Information 2014 [place], [place].  Concussion, Adult A concussion is a brain injury. It is caused by:  A hit to the head.  A quick and sudden movement (jolt)  of the head or neck. A concussion is usually not life-threatening. Even so, it can cause serious problems. If you had a concussion before, you may have concussion-like problems after a hit to your head. HOME CARE General Instructions  Follow your doctor's directions carefully.  Take medicines only as told by your doctor.  Only take medicines your doctor says are safe.  Do not drink alcohol until your doctor says it is OK. Alcohol and some drugs can slow down healing. They can also put you at risk for further injury.  If your are having trouble remembering things, write them down.  Try to do  one thing at a time if you get distracted easily. For example, do not watch TV while making dinner.  Talk to your family members or close friends when making important decisions.  Follow up with your doctor as told.  Watch your symptoms. Tell others to do the same. Serious problems can sometimes happen after a concussion. Older adults are more likely to have these problems.  Tell your teachers, school nurse, school counselor, coach, Event organiser, or work Production designer, theatre/television/film about your concussion. Tell them about what you can or cannot do. They should watch to see if:  It gets even harder for you to pay attention or concentrate.  It gets even harder for you to remember things or learn new things.  You need more time than normal to finish things.  You become annoyed (irritable) more than before.  You are not able to deal with stress as well.  You have more problems than before.  Rest. Make sure you:  Get plenty of sleep at night.  Go to sleep early.  Go to bed at the same time every day. Try to wake up at the same time.  Rest during the day.  Take naps when you feel tired.  Limit activities where you have to think a lot or concentrate. These include:  Doing homework.  Doing work related to a job.  Watching TV.  Using the computer. Returning To Your Regular Activities Return to your  normal activities slowly, not all at once. You must give your body and brain enough time to heal.   Do not play sports or do other athletic activities until your doctor says it is OK.  Ask your doctor when you can drive, ride a bicycle, or work other vehicles or machines. Never do these things if you feel dizzy.  Ask your doctor about when you can return to work or school. Preventing Another Concussion It is very important to avoid another brain injury, especially before you have healed. In rare cases, another injury can lead to permanent brain damage, brain swelling, or death. The risk of this is greatest during the first 7 10 after your injury. Avoid injuries by:   Wearing a seat belt when riding in a car.  Not drinking too much alcohol.  Avoiding activities that could lead to a second concussion (such as contact sports).  Wearing a helmet when doing activities like:  Biking.  Skiing.  Skateboarding.  Skating.  Making your home safer by:  Removing things from the floor or stairways that could make you trip.  Using grab bars in bathrooms and handrails by stairs.  Placing non-slip mats on floors and in bathtubs.  Improve lighting in dark areas. GET HELP IF:  It gets even harder for you to pay attention or concentrate.  It gets even harder for you to remember things or learn new things.  You need more time than normal to finish things.  You become annoyed (irritable) more than before.  You are not able to deal with stress as well.  You have more problems than before.  You have problems keeping your balance.  You are not able to react quickly when you should. Get help if you have any of these problems for more than 2 weeks:   Lasting (chronic) headaches.  Dizziness or trouble balancing.  Feeling sick to your stomach (nausea).  Seeing (vision) problems.  Being affected by noises or light more than normal.  Feeling sad, low, down in the dumps, blue, gloomy,  or empty (depressed).  Mood changes (mood swings).  Feeling of fear or nervousness about what may happen (anxiety).  Feeling annoyed.  Memory problems.  Problems concentrating or paying attention.  Sleep problems.  Feeling tired all the time. GET HELP RIGHT AWAY IF:   You have bad headaches or your headaches get worse.  You have weakness (even if it is in one hand, leg, or part of the face).  You have loss of feeling (numbness).  You feel off balance.  You keep throwing up (vomiting).  You feel tired.  One black center of your eye (pupil) is larger than the other.  You twitch or shake violently (convulse).  Your speech is not clear (slurred).  You are more confused, easily angered (agitated), or annoyed than before.  You have more trouble resting than before.  You are unable to recognize people or places.  You have neck pain.  It is difficult to wake you up.  You have unusual behavior changes.  You pass out (lose consciousness). MAKE SURE YOU:   Understand these instructions.  Will watch your condition.  Will get help right away if you are not doing well or get worse. Document Released: 07/03/2009 Document Revised: 05/05/2013 Document Reviewed: 02/04/2013 [facility] Patient Information 2014 [employer].  Head Injury, Adult You have received a head injury. It does not appear serious at this time. Headaches and vomiting are common following head injury. It should be easy to awaken from sleeping. Sometimes it is necessary for you to stay in the emergency department for a while for observation. Sometimes admission to the hospital may be needed. After injuries such as yours, most problems occur within the first 24 hours, but side effects may occur up to 7 10 days after the injury. It is important for you to carefully monitor your condition and contact your health care provider or seek immediate medical care if there is a change in your condition. WHAT ARE THE  TYPES OF HEAD INJURIES? Head injuries can be as minor as a bump. Some head injuries can be more severe. More severe head injuries include:  A jarring injury to the brain (concussion).  A bruise of the brain (contusion). This mean there is bleeding in the brain that can cause swelling.  A cracked skull (skull fracture).  Bleeding in the brain that collects, clots, and forms a bump (hematoma). WHAT CAUSES A HEAD INJURY? A serious head injury is most likely to happen to someone who is in a car wreck and is not wearing a seat belt. Other causes of major head injuries include bicycle or motorcycle accidents, sports injuries, and falls. HOW ARE HEAD INJURIES DIAGNOSED? A complete history of the event leading to the injury and your current symptoms will be helpful in diagnosing head injuries. Many times, pictures of the brain, such as CT or MRI are needed to see the extent of the injury. Often, an overnight hospital stay is necessary for observation.  WHEN SHOULD I SEEK IMMEDIATE MEDICAL CARE?  You should get help right away if:  You have confusion or drowsiness.  You feel sick to your stomach (nauseous) or have continued, forceful vomiting.  You have dizziness or unsteadiness that is getting worse.  You have severe, continued headaches not relieved by medicine. Only take over-the-counter or prescription medicines for pain, fever, or discomfort as directed by your health care provider.  You do not have normal function of the arms or legs or are unable to walk.  You notice  changes in the black spots in the center of the colored part of your eye (pupil).  You have a clear or bloody fluid coming from your nose or ears.  You have a loss of vision. During the next 24 hours after the injury, you must stay with someone who can watch you for the warning signs. This person should contact local emergency services (911 in the [place] if you have seizures, you become unconscious, or you are unable to wake  up. HOW CAN I PREVENT A HEAD INJURY IN THE FUTURE? The most important factor for preventing major head injuries is avoiding motor vehicle accidents. To minimize the potential for damage to your head, it is crucial to wear seat belts while riding in motor vehicles. Wearing helmets while bike riding and playing collision sports (like football) is also helpful. Also, avoiding dangerous activities around the house will further help reduce your risk of head injury.  WHEN CAN I RETURN TO NORMAL ACTIVITIES AND ATHLETICS? You should be reevaluated by your health care provider before returning to these activities. If you have any of the following symptoms, you should not return to activities or contact sports until 1 week after the symptoms have stopped:  Persistent headache.  Dizziness or vertigo.  Poor attention and concentration.  Confusion.  Memory problems.  Nausea or vomiting.  Fatigue or tire easily.  Irritability.  Intolerant of bright lights or loud noises.  Anxiety or depression.  Disturbed sleep. MAKE SURE YOU:   Understand these instructions.  Will watch your condition.  Will get help right away if you are not doing well or get worse. Document Released: 07/15/2005 Document Revised: 05/05/2013 Document Reviewed: 03/22/2013 [facility] Patient Information 2014 [place].

## 2013-09-18 NOTE — ED Provider Notes (Signed)
CSN: [id number]     Arrival date & time 09/18/13  1224 History   First MD Initiated Contact with Patient 09/18/13 1255     Chief Complaint  Patient presents with  . Fall     (Consider location/radiation/quality/duration/timing/severity/associated sxs/prior Treatment) HPI  80 year old presenting after a fall. Patient slipped on the ice in his driveway earlier today and fell backwards. Struck the back of his head. He thinks he had a low seat. Patient is amnestic to the events for several minutes afterwards. He was assisted up and back inside by a passerby. Had 1-2 cups of coffee and played cards with his wife. No further complaints. Concerned about brief amnesia though and called nursing line. Suggested that he come to ED. Pt currently complaining of mild HA. No neck, back or pain anywhere else. No visual complaints. No acute numbness, tingling or loss of strength. No nausea. Takes 81mg  ASA daily.    Past Medical History  Diagnosis Date  . Obstructive sleep apnea   . hypertension   . Syncope and collapse   . Pacemaker-MDT     Change out 2008  . Other abnormal glucose   . Overweight   . Esophageal reflux   . Diverticulosis of colon (without mention of hemorrhage)   . Irritable bowel syndrome   . Hypertrophy of prostate with urinary obstruction and other lower urinary tract symptoms (LUTS)   . Osteoarthrosis, unspecified whether generalized or localized, unspecified site   . Backache, unspecified   . Unspecified hereditary and idiopathic peripheral neuropathy   . Anal fissure    Past Surgical History  Procedure Laterality Date  . Appendectomy    . Cholecystectomy    . Tonsillectomy    . Pacemaker placement    . Skin cancer removed  12/2012    removed from his face--Dr. Sarajane Jews   Family History  Problem Relation Age of Onset  . Coronary artery disease Father     bladder cancer  . Coronary artery disease Mother   . Diabetes Sister     2 sisters   History  Substance Use  Topics  . Smoking status: Never Smoker   . Smokeless tobacco: Never Used  . Alcohol Use: Yes    Review of Systems  All systems reviewed and negative, other than as noted in HPI.   Allergies  Shellfish allergy  Home Medications   Current Outpatient Rx  Name  Route  Sig  Dispense  Refill  . acetaminophen (TYLENOL) 500 MG tablet   Oral   Take 1,000 mg by mouth every 6 (six) hours as needed for pain.         Marland Kitchen aspirin 81 MG tablet   Oral   Take 81 mg by mouth daily.           . calcium carbonate (OS-CAL) 600 MG TABS   Oral   Take 600 mg by mouth daily.           . Cholecalciferol (VITAMIN D) 1000 UNITS capsule   Oral   Take 1,000 Units by mouth daily.           . finasteride (PROSCAR) 5 MG tablet   Oral   Take 1 tablet by mouth daily.         . Lecithin 1200 MG CAPS   Oral   Take 1 capsule by mouth daily.           Marland Kitchen loratadine (CLARITIN) 10 MG tablet   Oral   Take 10  mg by mouth daily.           Marland Kitchen losartan-hydrochlorothiazide (HYZAAR) 100-12.5 MG per tablet   Oral   Take 1 tablet by mouth daily.   90 tablet   3   . Multiple Vitamin (MULTIVITAMIN) capsule   Oral   Take 1 capsule by mouth daily.           . Naproxen Sodium (ALEVE) 220 MG CAPS   Oral   Take 2 capsules by mouth daily.          . Omega-3 Fatty Acids (DIALYVITE OMEGA-3 CONCENTRATE) 600 MG CAPS   Oral   Take 1 capsule by mouth daily.         Marland Kitchen omeprazole (PRILOSEC) 20 MG capsule   Oral   Take 20 mg by mouth daily.         . Tamsulosin HCl (FLOMAX) 0.4 MG CAPS   Oral   Take 0.4 mg by mouth daily.           . traMADol (ULTRAM) 50 MG tablet   Oral   Take 50 mg by mouth every 8 (eight) hours as needed for pain.           BP 143/92  Pulse 76  Temp(Src) 98.5 F (36.9 C) (Oral)  Resp 16  SpO2 97% Physical Exam  Nursing note and vitals reviewed. Constitutional: He is oriented to person, place, and time. He appears well-developed and well-nourished. No  distress.  HENT:  Head: Normocephalic.  Superficial abrasion to posterior scalp. Minimal surrounding tenderness  Eyes: Conjunctivae are normal. Right eye exhibits no discharge. Left eye exhibits no discharge.  Neck: Neck supple.  Cardiovascular: Normal rate, regular rhythm and normal heart sounds.  Exam reveals no gallop and no friction rub.   No murmur heard. Pulmonary/Chest: Effort normal and breath sounds normal. No respiratory distress.  Abdominal: Soft. He exhibits no distension. There is no tenderness.  Musculoskeletal: He exhibits no edema and no tenderness.  No midline spinal tenderness  Neurological: He is alert and oriented to person, place, and time. No cranial nerve deficit. He exhibits normal muscle tone. Coordination normal.  Gait steady. Able to get up and down from seated position w/o assistance. CN intact. Strength 5/5 b/l u/l extremities. Good finger to nose b/l.   Skin: Skin is warm and dry. He is not diaphoretic.  Psychiatric: He has a normal mood and affect. His behavior is normal. Thought content normal.    ED Course  Procedures (including critical care time) Labs Review Labs Reviewed - No data to display Imaging Review Ct Head Wo Contrast  09/18/2013   CLINICAL DATA:  Headache.  Status post fall  EXAM: CT HEAD WITHOUT CONTRAST  TECHNIQUE: Contiguous axial images were obtained from the base of the skull through the vertex without intravenous contrast.  COMPARISON:  None.  FINDINGS: Small hyper density overlying the left frontal lobe is identified measuring 10 mm compatible with small hematoma. Adjacent parafalcine hematoma is also noted measuring approximately 2 mm in thickness, image 24/series 2. No significant mass effect identified. Patchy low attenuation within the subcortical white matter is identified. Subacute to chronic left basal ganglia lacunar infarct noted, image 13/series 2. The ventricular volumes and sulci appear normal.  The paranasal sinuses stress set  mild mucosal thickening involves the sphenoid sinus. The mastoid air cells are clear. The skull is intact.  IMPRESSION: 1. Examination is positive for small left frontal hematoma. There is associated small anterior parafalcine subdural hematoma.  2. No significant mass effect. 3. Patchy areas of low attenuation within the subcortical white matter compatible with chronic microvascular disease.   Electronically Signed   By: Kerby Moors M.D.   On: 09/18/2013 15:17    EKG Interpretation   None       MDM   Final diagnoses:  Closed head injury  Bleeding in head following injury with loss of consciousness    79yM with head injury after fall. +LOC. Nonfocal exam. Minimal further symptoms. On 81mg  ASA. Imaging as above. Will discuss with neurosurgery.   Imaging as above. Discussed with neurosurgery. Admission for observation versus discharge with strict return precautions. Discussed options with patient and significant other. Patient electing to go home. I feel that this is reasonable. Instructed him to avoid NSAIDs and hold his aspirin for a week. Patient is largely asymptomatic aside from a mild headache. Has no further complaints throughout his ED stay.  Return precautions discussed in detail.     Virgel Manifold, MD 09/23/13 1246

## 2013-09-21 ENCOUNTER — Telehealth: Payer: Self-pay | Admitting: Pulmonary Disease

## 2013-09-21 DIAGNOSIS — W19XXXA Unspecified fall, initial encounter: Secondary | ICD-10-CM

## 2013-09-21 DIAGNOSIS — T148XXA Other injury of unspecified body region, initial encounter: Secondary | ICD-10-CM

## 2013-09-21 DIAGNOSIS — S065X9A Traumatic subdural hemorrhage with loss of consciousness of unspecified duration, initial encounter: Secondary | ICD-10-CM

## 2013-09-21 DIAGNOSIS — S065XAA Traumatic subdural hemorrhage with loss of consciousness status unknown, initial encounter: Secondary | ICD-10-CM

## 2013-09-21 NOTE — Telephone Encounter (Signed)
Spoke with pt's wife. She reports that since being discharged from the hospital he has been having lightheadedness. This happens when he is walking or standing up. Nausea is present but no vomiting. Has a continual headache. Was seen in the hospital for a mild concussion. Wife has given him some Meclizine to help with symptoms. Would like SN's recs.  SN - please advise.

## 2013-09-21 NOTE — Telephone Encounter (Signed)
Per SN -   Continue with Meclizine prn. If symptoms continue - will need a follow up CT Head or appointment with Neuro.  lmtcb x1

## 2013-09-22 NOTE — Telephone Encounter (Signed)
LMTCBx2. Braylea Brancato, CMA  

## 2013-09-24 ENCOUNTER — Ambulatory Visit (HOSPITAL_COMMUNITY)
Admission: RE | Admit: 2013-09-24 | Discharge: 2013-09-24 | Disposition: A | Discharge status: Home / Self Care | Payer: Medicare Other | Source: Ambulatory Visit | Attending: Pulmonary Disease | Admitting: Pulmonary Disease

## 2013-09-24 DIAGNOSIS — I6200 Nontraumatic subdural hemorrhage, unspecified: Secondary | ICD-10-CM | POA: Insufficient documentation

## 2013-09-24 DIAGNOSIS — S065X9A Traumatic subdural hemorrhage with loss of consciousness of unspecified duration, initial encounter: Secondary | ICD-10-CM

## 2013-09-24 DIAGNOSIS — S065XAA Traumatic subdural hemorrhage with loss of consciousness status unknown, initial encounter: Secondary | ICD-10-CM

## 2013-09-24 NOTE — Telephone Encounter (Signed)
Spoke with the pt and his spouse and notified of recs per SN They verbalized understanding and are aware we are working on CT to be set up  Pt declined zofran being called in, b/c he only had 1 episode of nausea and this is not needed  Pt to go to ED if worsens

## 2013-09-24 NOTE — Telephone Encounter (Signed)
Called spoke with pt wife. She reports pt is stating his head is still hurting and feels lightheaded. He has been able to get up a few times. Now in the evenings vertigo is slightly worse. Please advise SN thanks

## 2013-09-24 NOTE — Telephone Encounter (Signed)
Per SN---  CT on 2/21 showed small hematoma---needs a follow up CT head without contrast.  This order has been placed and rhonda is working on this to see if we can do this today.    Call in   zofran 4 mg  #12  1 po every 4 hours as needed for nausea He has tylenol, tramadol, oxycodone for pain  If worse this weekend then he will need to go to the ER for further eval.    i have called and lmomtcb for the pts wife to make her aware.

## 2013-09-24 NOTE — Telephone Encounter (Signed)
Pt returned call. Please call back at [phone]

## 2013-09-27 ENCOUNTER — Other Ambulatory Visit: Payer: Self-pay | Admitting: Pulmonary Disease

## 2013-09-27 DIAGNOSIS — S065X9A Traumatic subdural hemorrhage with loss of consciousness of unspecified duration, initial encounter: Secondary | ICD-10-CM

## 2013-09-27 DIAGNOSIS — S065XAA Traumatic subdural hemorrhage with loss of consciousness status unknown, initial encounter: Secondary | ICD-10-CM

## 2013-09-27 DIAGNOSIS — G609 Hereditary and idiopathic neuropathy, unspecified: Secondary | ICD-10-CM

## 2013-09-30 ENCOUNTER — Encounter: Payer: Self-pay | Admitting: Neurology

## 2013-09-30 ENCOUNTER — Ambulatory Visit (INDEPENDENT_AMBULATORY_CARE_PROVIDER_SITE_OTHER): Payer: Medicare Other | Admitting: Neurology

## 2013-09-30 VITALS — BP 152/84 | HR 88 | Resp 14 | Ht 69.0 in | Wt 217.0 lb

## 2013-09-30 DIAGNOSIS — R519 Headache, unspecified: Secondary | ICD-10-CM | POA: Insufficient documentation

## 2013-09-30 DIAGNOSIS — S065XAA Traumatic subdural hemorrhage with loss of consciousness status unknown, initial encounter: Secondary | ICD-10-CM

## 2013-09-30 DIAGNOSIS — S065X9A Traumatic subdural hemorrhage with loss of consciousness of unspecified duration, initial encounter: Secondary | ICD-10-CM

## 2013-09-30 DIAGNOSIS — R51 Headache: Secondary | ICD-10-CM

## 2013-09-30 DIAGNOSIS — I6200 Nontraumatic subdural hemorrhage, unspecified: Secondary | ICD-10-CM

## 2013-09-30 MED ORDER — NORTRIPTYLINE HCL 10 MG PO CAPS: 10.0000 mg | ORAL_CAPSULE | Freq: Every day | ORAL | Status: DC

## 2013-09-30 NOTE — Progress Notes (Signed)
NEUROLOGY CONSULTATION NOTE  Tony Cruz MRN: [id number] DOB: 1933/11/12  Referring provider: Dr. Teressa Lower Primary care provider: Dr. Teressa Lower  Reason for consult:  Fall, subdural hematoma  Dear [name]:  Thank you for your kind referral of Tony Cruz for consultation of the above symptoms. Although his history is well known to you, please allow me to reiterate it for the purpose of our medical record. The patient was accompanied to the clinic by his wife who also provides collateral information.   HISTORY OF PRESENT ILLNESS: This is a very pleasant 80 year old right-handed man with a history of hypertension, syncope secondary to intermittent sinus node dysfunction s/p pacemaker placement, as well as vasovagal syncope, in his usual state of health until 09/18/13 when he slipped on ice in their driveway.  Passersby heard him fall and found him "in shock," he was able to stand up and walk into their house, however has no recollection of events until he was in the kitchen.  He had a cut at the base of his head and felt sore in the jaw and right shoulder.  He went to [facility] ER where head CT had shown a small hyperdensity overlying the left frontal lobe measuring 10 mmm compatible with small hematoma. Adjacent parafalcine  hematoma was also noted measuring approximately 2 mm in thickness.  He was discharged home with close follow-up.  He had a frontal 8/10 headache for the first few days, this has reduced in intensity, however he continues to have a constant 4-5/10 frontal pressure-like headache.  His eyes are extremely sensitive to light.  His wife reports that he had been having milder frontal headaches 3 weeks prior to the fall, attributed to his sinuses.  He feels his equilibrium is off, particularly worse when lying on his left side, feeling like he is "falling into an abyss."  He feels unsteady when sitting and standing up, then starts feeling fairly comfortable once  walking.  If he has to make a quick move, he "would be in trouble."  He felt nauseated the first few days, this has resolved.  He had been taking aspirin, Aleve and Tramadol for arthritis prior to the fall, and had discontinued this.  Arthritis pain is now worse.  He is usually active and energetic, however has had fatigue since the fall, usually feeling tired after 3 hours.    He has noticed difficulty focusing, but denies blurred or double vision.  No dysarthria/dysphagia.  Jaw pain is better but still bothersome.  He has a history of neuropathy with constant numbness and cold feeling in the soles of his feet.  No bowel/bladder dysfunction.  Records and images were personally reviewed where available. Repeat head CT done 09/24/13 showed minimal amount of subdural hemorrhage along the anterior falx less dense but otherwise unchanged, hemorrhage along the medial anterior/superior left frontal lobe is without significant change.  PAST MEDICAL HISTORY: Past Medical History  Diagnosis Date  . Obstructive sleep apnea   . hypertension   . Syncope and collapse   . Pacemaker-MDT     Change out 2008  . Other abnormal glucose   . Overweight   . Esophageal reflux   . Diverticulosis of colon (without mention of hemorrhage)   . Irritable bowel syndrome   . Hypertrophy of prostate with urinary obstruction and other lower urinary tract symptoms (LUTS)   . Osteoarthrosis, unspecified whether generalized or localized, unspecified site   . Backache, unspecified   .  Unspecified hereditary and idiopathic peripheral neuropathy   . Anal fissure     PAST SURGICAL HISTORY: Past Surgical History  Procedure Laterality Date  . Appendectomy    . Cholecystectomy    . Tonsillectomy    . Pacemaker placement    . Skin cancer removed  12/2012    removed from his face--Dr. Sarajane Jews    MEDICATIONS: Current Outpatient Prescriptions on File Prior to Visit  Medication Sig Dispense Refill  . acetaminophen (TYLENOL)  500 MG tablet Take 1,000 mg by mouth every 6 (six) hours as needed for pain.      . calcium carbonate (OS-CAL) 600 MG TABS Take 600 mg by mouth daily.        . Cholecalciferol (VITAMIN D) 1000 UNITS capsule Take 1,000 Units by mouth daily.        . finasteride (PROSCAR) 5 MG tablet Take 1 tablet by mouth daily.      . Lecithin 1200 MG CAPS Take 1 capsule by mouth daily.        Marland Kitchen loratadine (CLARITIN) 10 MG tablet Take 10 mg by mouth daily.        Marland Kitchen losartan-hydrochlorothiazide (HYZAAR) 100-12.5 MG per tablet Take 1 tablet by mouth daily.  90 tablet  3  . Multiple Vitamin (MULTIVITAMIN) capsule Take 1 capsule by mouth daily.        . Omega-3 Fatty Acids (DIALYVITE OMEGA-3 CONCENTRATE) 600 MG CAPS Take 1 capsule by mouth daily.      Marland Kitchen omeprazole (PRILOSEC) 20 MG capsule Take 20 mg by mouth daily.      . Tamsulosin HCl (FLOMAX) 0.4 MG CAPS Take 0.4 mg by mouth daily.         No current facility-administered medications on file prior to visit.    ALLERGIES: Allergies  Allergen Reactions  . Shellfish Allergy     Sensitive to shellfish    FAMILY HISTORY: Family History  Problem Relation Age of Onset  . Coronary artery disease Father     bladder cancer  . Coronary artery disease Mother   . Diabetes Sister     2 sisters    SOCIAL HISTORY: History   Social History  . Marital Status: Married    Spouse Name: phyllis Raggio    Number of Children: N/A  . Years of Education: N/A   Occupational History  . Retired   .     Social History Main Topics  . Smoking status: Never Smoker   . Smokeless tobacco: Never Used  . Alcohol Use: Yes  . Drug Use: No  . Sexual Activity: Not on file   Other Topics Concern  . Not on file   Social History Narrative  . No narrative on file    REVIEW OF SYSTEMS: Constitutional: No fevers, chills, or sweats, no generalized fatigue, change in appetite Eyes: as above Ear, nose and throat: No hearing loss, ear pain, nasal congestion, sore  throat Cardiovascular: No chest pain, palpitations Respiratory:  No shortness of breath at rest or with exertion, wheezes GastrointestinaI: No nausea, vomiting, diarrhea, abdominal pain, fecal incontinence Genitourinary:  No dysuria, urinary retention or frequency Musculoskeletal:  + neck pain, back pain Integumentary: No rash, pruritus, skin lesions Neurological: as above Psychiatric: No depression, insomnia, anxiety Endocrine: No palpitations, fatigue, diaphoresis, mood swings, change in appetite, change in weight, increased thirst Hematologic/Lymphatic:  No anemia, purpura, petechiae. Allergic/Immunologic: no itchy/runny eyes, nasal congestion, recent allergic reactions, rashes  PHYSICAL EXAM: Filed Vitals:   09/30/13 0753  BP:  152/84  Pulse: 88  Resp: 14   General: No acute distress Head:  Normocephalic/atraumatic Neck: supple, no paraspinal tenderness, full range of motion Back: No paraspinal tenderness Heart: regular rate and rhythm Lungs: Clear to auscultation bilaterally. Vascular: No carotid bruits. Skin/Extremities: No rash, no edema Neurological Exam: Mental status: alert and oriented to person, place, and time, no dysarthria or dysphagia, Fund of knowledge is appropriate.  Recent and remote memory are intact.  Attention and concentration are normal.    Able to name objects and repeat phrases. Cranial nerves: CN I: not tested CN II: pupils equal, round and reactive to light, visual fields intact, fundi unremarkable. CN III, IV, VI:  full range of motion, no nystagmus, no ptosis CN V: facial sensation intact CN VII: upper and lower face symmetric CN VIII: hearing intact CN IX, X: gag intact, uvula midline CN XI: sternocleidomastoid and trapezius muscles intact CN XII: tongue midline Bulk & Tone: normal, no fasciculations. Motor: 5/5 throughout with no pronator drift. Sensation: decreased pin, cold, vibration up to the ankles bilaterally.  Intact to all modalities  on both UE.  Romberg test positive Deep Tendon Reflexes: +2 throughout except for absent ankle reflexes bilaterally, no clonus Plantar responses: downgoing bilaterally Finger to nose testing: no incoordination  Gait: narrow-based and steady, able to tandem walk adequately.  IMPRESSION: This is a pleasant 80 year old right-handed man with a history of hypertension, syncope due to sinus node dysfunction s/p pacemaker placement, vasovagal syncope, peripheral neuropathy, who slipped on ice on 09/18/13 and sustained a small subdural hematoma in the anterior falx and left frontal region.  Repeat imaging on 02/27 is stable.  He continues to have headaches and balance problems, consistent with post-concussive syndrome.  We discussed prognosis, majority of patients improve within a few weeks. Symptomatic treatment with amitriptyline for post-concussive headaches will be started at 25mg  qhs.  Side effects were discussed.  He has been taking 1000mg  TID Tylenol, and will start reducing dose to help avoid rebound headaches.  Interval imaging with repeat head CT will be done in 4 weeks, however he knows to call our office if symptoms worsen in the interim.  He will follow-up in 4 weeks.  Thank you for allowing me to participate in the care of this patient. Please do not hesitate to call for any questions or concerns.   Ellouise Newer, M.D.

## 2013-09-30 NOTE — Patient Instructions (Addendum)
1. Repeat Head CT without contrast in 4 weeks April 2 @11  am please arrive 15 minutes prior for check in [place] 2. Start nortriptyline 10mg  at bedtime 3. Start reducing Tylenol intake 4. Follow-up in 4 weeks

## 2013-10-04 ENCOUNTER — Telehealth: Payer: Self-pay | Admitting: Neurology

## 2013-10-04 NOTE — Telephone Encounter (Signed)
Discussed concerns, nortriptyline has been found helpful for post-concussive headaches, if at any point he has side effects, he knows to call our office. Patient will start medication and keep [place] informed.

## 2013-10-05 ENCOUNTER — Ambulatory Visit (INDEPENDENT_AMBULATORY_CARE_PROVIDER_SITE_OTHER): Payer: Medicare Other | Admitting: *Deleted

## 2013-10-05 DIAGNOSIS — I495 Sick sinus syndrome: Secondary | ICD-10-CM

## 2013-10-05 DIAGNOSIS — Z950 Presence of cardiac pacemaker: Secondary | ICD-10-CM

## 2013-10-09 ENCOUNTER — Encounter: Payer: Self-pay | Admitting: Internal Medicine

## 2013-10-09 LAB — MDC_IDC_ENUM_SESS_TYPE_REMOTE
Battery Impedance: 1629 Ohm
Battery Remaining Longevity: 33 mo
Battery Voltage: 2.74 V
Date Time Interrogation Session: 20150310103650
Lead Channel Impedance Value: 635 Ohm
Lead Channel Impedance Value: 705 Ohm
Lead Channel Pacing Threshold Amplitude: 0.75 V
Lead Channel Pacing Threshold Pulse Width: 0.4 ms
Lead Channel Setting Pacing Amplitude: 2.5 V
Lead Channel Setting Pacing Pulse Width: 0.4 ms
Lead Channel Setting Sensing Sensitivity: 2 mV
MDC IDC MSMT LEADCHNL RA PACING THRESHOLD AMPLITUDE: 0.625 V
MDC IDC MSMT LEADCHNL RA PACING THRESHOLD PULSEWIDTH: 0.4 ms
MDC IDC MSMT LEADCHNL RA SENSING INTR AMPL: 0.7 mV
MDC IDC MSMT LEADCHNL RV SENSING INTR AMPL: 5.6 mV
MDC IDC SET LEADCHNL RA PACING AMPLITUDE: 2 V
MDC IDC STAT BRADY AP VP PERCENT: 0 %
MDC IDC STAT BRADY AP VS PERCENT: 0 %
MDC IDC STAT BRADY AS VP PERCENT: 0 %
MDC IDC STAT BRADY AS VS PERCENT: 100 %

## 2013-10-15 ENCOUNTER — Encounter: Payer: Self-pay | Admitting: Neurology

## 2013-10-27 ENCOUNTER — Encounter: Payer: Self-pay | Admitting: *Deleted

## 2013-10-28 ENCOUNTER — Encounter: Payer: Self-pay | Admitting: Neurology

## 2013-10-28 ENCOUNTER — Encounter: Payer: Self-pay | Admitting: Pulmonary Disease

## 2013-10-28 ENCOUNTER — Ambulatory Visit (HOSPITAL_COMMUNITY): Payer: Medicare Other

## 2013-10-28 ENCOUNTER — Telehealth: Payer: Self-pay | Admitting: Neurology

## 2013-10-28 ENCOUNTER — Ambulatory Visit (HOSPITAL_COMMUNITY)
Admission: RE | Admit: 2013-10-28 | Discharge: 2013-10-28 | Disposition: A | Discharge status: Home / Self Care | Payer: Medicare Other | Source: Ambulatory Visit | Attending: Neurology | Admitting: Neurology

## 2013-10-28 ENCOUNTER — Encounter (HOSPITAL_COMMUNITY): Payer: Self-pay

## 2013-10-28 DIAGNOSIS — Y929 Unspecified place or not applicable: Secondary | ICD-10-CM | POA: Insufficient documentation

## 2013-10-28 DIAGNOSIS — Y999 Unspecified external cause status: Secondary | ICD-10-CM | POA: Insufficient documentation

## 2013-10-28 DIAGNOSIS — S064X9A Epidural hemorrhage with loss of consciousness of unspecified duration, initial encounter: Principal | ICD-10-CM

## 2013-10-28 DIAGNOSIS — S065X9A Traumatic subdural hemorrhage with loss of consciousness of unspecified duration, initial encounter: Secondary | ICD-10-CM

## 2013-10-28 DIAGNOSIS — S065XAA Traumatic subdural hemorrhage with loss of consciousness status unknown, initial encounter: Secondary | ICD-10-CM | POA: Insufficient documentation

## 2013-10-28 DIAGNOSIS — S066XAA Traumatic subarachnoid hemorrhage with loss of consciousness status unknown, initial encounter: Secondary | ICD-10-CM | POA: Insufficient documentation

## 2013-10-28 DIAGNOSIS — Z9181 History of falling: Secondary | ICD-10-CM | POA: Insufficient documentation

## 2013-10-28 DIAGNOSIS — I6200 Nontraumatic subdural hemorrhage, unspecified: Secondary | ICD-10-CM | POA: Insufficient documentation

## 2013-10-28 DIAGNOSIS — S066X9A Traumatic subarachnoid hemorrhage with loss of consciousness of unspecified duration, initial encounter: Principal | ICD-10-CM

## 2013-10-28 DIAGNOSIS — R51 Headache: Secondary | ICD-10-CM

## 2013-10-28 DIAGNOSIS — Z87820 Personal history of traumatic brain injury: Secondary | ICD-10-CM | POA: Insufficient documentation

## 2013-10-28 DIAGNOSIS — X58XXXA Exposure to other specified factors, initial encounter: Secondary | ICD-10-CM | POA: Insufficient documentation

## 2013-10-28 DIAGNOSIS — S0280XA Fracture of other specified skull and facial bones, unspecified side, initial encounter for closed fracture: Secondary | ICD-10-CM | POA: Insufficient documentation

## 2013-10-28 MED ORDER — NORTRIPTYLINE HCL 10 MG PO CAPS: 10.0000 mg | ORAL_CAPSULE | Freq: Every day | ORAL | Status: DC

## 2013-10-28 NOTE — Telephone Encounter (Signed)
[name] please make sure that [name] sees attached Email from patient. Thanks!

## 2013-10-28 NOTE — Telephone Encounter (Signed)
Spoke to patient, he is doing well, aside from "some bobble head" when standing up, he feels okay once he starts moving.  No falls since 09/18/2013.  He denies any headaches, dizziness, nausea/vomiting.  He does feel more "bobble" when he turns head on pillow to the left.  Discussed his head CT findings with the radiologist and neurosurgeon Dr. Christella Noa, there is a new small subdural in the left parietal region.  Dr. Christella Noa reviewed images and does not note the nondisplaced convexity fracture noted.  He advised there is no need to go to ER, instead, schedule f/u with Dr. Christella Noa in 3 weeks.  These were discussed with the patient, he knows to go to the ER immediately if he starts having worsening headache, nausea/vomiting, drowsiness.  He will follow-up with me as scheduled.

## 2013-10-28 NOTE — Telephone Encounter (Signed)
Patient to continue nortriptyline for now, refills sent to pharmacy.

## 2013-10-29 ENCOUNTER — Encounter: Payer: Self-pay | Admitting: Internal Medicine

## 2013-11-02 ENCOUNTER — Encounter: Payer: Self-pay | Admitting: Neurology

## 2013-11-03 MED ORDER — AMLODIPINE BESYLATE 5 MG PO TABS: 5.0000 mg | ORAL_TABLET | Freq: Every day | ORAL | Status: DC

## 2013-11-03 NOTE — Telephone Encounter (Signed)
Office notes faxed to [facility]

## 2013-11-04 ENCOUNTER — Encounter: Payer: Self-pay | Admitting: Internal Medicine

## 2013-11-05 ENCOUNTER — Ambulatory Visit (INDEPENDENT_AMBULATORY_CARE_PROVIDER_SITE_OTHER): Payer: Medicare Other | Admitting: Neurology

## 2013-11-05 ENCOUNTER — Encounter: Payer: Self-pay | Admitting: Neurology

## 2013-11-05 VITALS — BP 128/88 | HR 88 | Wt 217.6 lb

## 2013-11-05 DIAGNOSIS — I6200 Nontraumatic subdural hemorrhage, unspecified: Secondary | ICD-10-CM

## 2013-11-05 DIAGNOSIS — R51 Headache: Secondary | ICD-10-CM

## 2013-11-05 DIAGNOSIS — S065X9A Traumatic subdural hemorrhage with loss of consciousness of unspecified duration, initial encounter: Secondary | ICD-10-CM

## 2013-11-05 DIAGNOSIS — S065XAA Traumatic subdural hemorrhage with loss of consciousness status unknown, initial encounter: Secondary | ICD-10-CM

## 2013-11-05 NOTE — Progress Notes (Signed)
NEUROLOGY FOLLOW UP OFFICE NOTE  NAHSIR Cruz [id number]  HISTORY OF PRESENT ILLNESS: I had the pleasure of seeing Mr. Tony Cruz in follow-up in the neurology clinic on 11/05/2013.  The patient was last seen a month ago after he slipped on ice on 09/18/13 and sustained a small subdural hematoma in the anterior falx and left frontal region. Repeat imaging a week later was stable.  He continued to have headaches and balance problems, consistent with post-concussive syndrome and started nortriptyline 10mg  qhs.  The headaches have resolved, he has had neck pain for the past 2 weeks, with limited movement in both directions, right more than left. He continues to feel unsteady.  Follow-up scan on 10/28/13 showed Interval clearing of previously noted tiny anterior falx subdural hematoma and minimal amount of blood anterior left frontal lobe. New from the prior examinations (and subtle in appearance) is the presence of a left parietal subdural hematoma with maximal thickness of 5.3 mm. Nondisplaced left convexity skull fracture crosses the suture spanning over 8 cm.  I personally reviewed scan and discussed this with radiology and neurosurgeon Dr. Christella Cruz who reviewed the scans as well and recommended a 3-4 week follow-up.  He has an appointment with neurosurgeon Dr. Sherwood Cruz this afternoon.  HPI:   This is a very pleasant 80 yo RH man with a history of hypertension, syncope secondary to intermittent sinus node dysfunction s/p pacemaker placement, as well as vasovagal syncope, in his usual state of health until 09/18/13 when he slipped on ice in their driveway. Passersby heard him fall and found him "in shock," he was able to stand up and walk into their house, however has no recollection of events until he was in the kitchen. He had a cut at the base of his head and felt sore in the jaw and right shoulder. He went to [facility] ER where head CT had shown a small hyperdensity overlying the left frontal  lobe measuring 10 mmm compatible with small hematoma. Adjacent parafalcine hematoma was also noted measuring approximately 2 mm in thickness. He was discharged home with close follow-up. He had a frontal 8/10 headache for the first few days, this has reduced in intensity, however he continues to have a constant 4-5/10 frontal pressure-like headache. His eyes are extremely sensitive to light. His wife reports that he had been having milder frontal headaches 3 weeks prior to the fall, attributed to his sinuses. He feels his equilibrium is off, particularly worse when lying on his left side, feeling like he is "falling into an abyss." He feels unsteady when sitting and standing up, then starts feeling fairly comfortable once walking. If he has to make a quick move, he "would be in trouble." He felt nauseated the first few days, this has resolved. He had been taking aspirin, Aleve and Tramadol for arthritis prior to the fall, and had discontinued this. Arthritis pain is now worse. He is usually active and energetic, however has had fatigue since the fall, usually feeling tired after 3 hours.   PAST MEDICAL HISTORY: Past Medical History  Diagnosis Date  . Obstructive sleep apnea   . hypertension   . Syncope and collapse   . Pacemaker-MDT     Change out 2008  . Other abnormal glucose   . Overweight   . Esophageal reflux   . Diverticulosis of colon (without mention of hemorrhage)   . Irritable bowel syndrome   . Hypertrophy of prostate with urinary obstruction and other lower urinary tract  symptoms (LUTS)   . Osteoarthrosis, unspecified whether generalized or localized, unspecified site   . Backache, unspecified   . Unspecified hereditary and idiopathic peripheral neuropathy   . Anal fissure     MEDICATIONS: Current Outpatient Prescriptions on File Prior to Visit  Medication Sig Dispense Refill  . acetaminophen (TYLENOL) 500 MG tablet Take 1,000 mg by mouth every 6 (six) hours as needed for pain.       Marland Kitchen amLODipine (NORVASC) 5 MG tablet Take 1 tablet (5 mg total) by mouth daily.  30 tablet  1  . calcium carbonate (OS-CAL) 600 MG TABS Take 600 mg by mouth daily.        . Cholecalciferol (VITAMIN D) 1000 UNITS capsule Take 1,000 Units by mouth daily.        . finasteride (PROSCAR) 5 MG tablet Take 1 tablet by mouth daily.      . Lecithin 1200 MG CAPS Take 1 capsule by mouth daily.        Marland Kitchen loratadine (CLARITIN) 10 MG tablet Take 10 mg by mouth daily.        . Multiple Vitamin (MULTIVITAMIN) capsule Take 1 capsule by mouth daily.        . nortriptyline (PAMELOR) 10 MG capsule Take 1 capsule (10 mg total) by mouth at bedtime.  30 capsule  3  . Omega-3 Fatty Acids (DIALYVITE OMEGA-3 CONCENTRATE) 600 MG CAPS Take 1 capsule by mouth daily.      Marland Kitchen omeprazole (PRILOSEC) 20 MG capsule Take 20 mg by mouth daily.      . Tamsulosin HCl (FLOMAX) 0.4 MG CAPS Take 0.4 mg by mouth daily.         No current facility-administered medications on file prior to visit.    ALLERGIES: Allergies  Allergen Reactions  . Shellfish Allergy     Sensitive to shellfish    FAMILY HISTORY: Family History  Problem Relation Age of Onset  . Coronary artery disease Father     bladder cancer  . Coronary artery disease Mother   . Diabetes Sister     2 sisters    SOCIAL HISTORY: History   Social History  . Marital Status: Married    Spouse Name: Tony Cruz    Number of Children: N/A  . Years of Education: N/A   Occupational History  . Retired   .     Social History Main Topics  . Smoking status: Never Smoker   . Smokeless tobacco: Never Used  . Alcohol Use: Yes  . Drug Use: No  . Sexual Activity: Not on file   Other Topics Concern  . Not on file   Social History Narrative  . No narrative on file    REVIEW OF SYSTEMS: Constitutional: No fevers, chills, or sweats, no generalized fatigue, change in appetite Eyes: No visual changes, double vision, eye pain Ear, nose and throat: No hearing  loss, ear pain, nasal congestion, sore throat Cardiovascular: No chest pain, palpitations Respiratory:  No shortness of breath at rest or with exertion, wheezes GastrointestinaI: No nausea, vomiting, diarrhea, abdominal pain, fecal incontinence Genitourinary:  No dysuria, urinary retention or frequency Musculoskeletal:  + neck pain, no back pain Integumentary: No rash, pruritus, skin lesions Neurological: as above Psychiatric: No depression, insomnia, anxiety Endocrine: No palpitations, fatigue, diaphoresis, mood swings, change in appetite, change in weight, increased thirst Hematologic/Lymphatic:  No anemia, purpura, petechiae. Allergic/Immunologic: no itchy/runny eyes, nasal congestion, recent allergic reactions, rashes  PHYSICAL EXAM: Filed Vitals:   11/05/13 1424  BP: 128/88  Pulse: 88   General: No acute distress  Head: Normocephalic/atraumatic  Neck: supple, no paraspinal tenderness, full range of motion  Back: No paraspinal tenderness  Heart: regular rate and rhythm  Lungs: Clear to auscultation bilaterally.  Vascular: No carotid bruits.  Skin/Extremities: No rash, no edema  Neurological Exam:  Mental status: alert and oriented to person, place, and time, no dysarthria or dysphagia, Fund of knowledge is appropriate. Recent and remote memory are intact. Attention and concentration are normal. Able to name objects and repeat phrases.  Cranial nerves:  CN I: not tested  CN II: pupils equal, round and reactive to light, visual fields intact, fundi unremarkable.  CN III, IV, VI: full range of motion, no nystagmus, no ptosis  CN V: facial sensation intact  CN VII: upper and lower face symmetric  CN VIII: hearing intact  CN IX, X: gag intact, uvula midline  CN XI: sternocleidomastoid and trapezius muscles intact  CN XII: tongue midline  Bulk & Tone: normal, no fasciculations.  Motor: 5/5 throughout with no pronator drift.  Sensation: decreased pin, cold, vibration up to the  ankles bilaterally. Intact to all modalities on both UE. Romberg test positive  Deep Tendon Reflexes: +2 throughout except for absent ankle reflexes bilaterally, no clonus  Plantar responses: downgoing bilaterally  Finger to nose testing: no incoordination  Gait: narrow-based and steady, able to tandem walk adequately.   IMPRESSION:  This is a pleasant 80 yo RH man with a history of hypertension, syncope due to sinus node dysfunction s/p pacemaker placement, vasovagal syncope, peripheral neuropathy, who slipped on ice on 09/18/13 and sustained a small subdural hematoma in the anterior falx and left frontal region. Imaging a week later was stable, however interval scan done last week showed a new left parietal subdural hematoma with maximal thickness of 5.3 mm.  On my review, this appears to have been present on prior scan, however radiologist reports this is new and noted a nondisplaced left convexity skull fracture crosses the suture spanning over 8 cm.  He has an appointment with neurosurgeon Dr. Sherwood Cruz this afternoon.  The post-concussive headaches have resolved, and he will taper off nortriptyline.  He will use prn Tylenol for the neck pain, we discussed using a muscle relaxant as well, however he would like to hold off on this for now.  He will follow-up in 3 months.  Thank you for allowing me to participate in his care.  Please do not hesitate to call for any questions or concerns.  The duration of this appointment visit was 20 minutes of face-to-face time with the patient.  Greater than 50% of this time was spent in counseling, explanation of diagnosis, planning of further management, and coordination of care.   Ellouise Newer, M.D.

## 2013-11-05 NOTE — Patient Instructions (Addendum)
1. Start tapering nortriptyline 10mg : take every other night for 1 week, then discontinue 2. Follow-up with neurosurgeon as scheduled 3. May take Tylenol for neck pain and joint pains 4. Follow-up in 3 months

## 2013-11-07 ENCOUNTER — Encounter: Payer: Self-pay | Admitting: Neurology

## 2013-11-08 ENCOUNTER — Encounter: Payer: Self-pay | Admitting: Pulmonary Disease

## 2013-11-16 ENCOUNTER — Other Ambulatory Visit (HOSPITAL_COMMUNITY): Payer: Self-pay | Admitting: Neurosurgery

## 2013-11-16 DIAGNOSIS — S065XAA Traumatic subdural hemorrhage with loss of consciousness status unknown, initial encounter: Secondary | ICD-10-CM

## 2013-11-16 DIAGNOSIS — S065X9A Traumatic subdural hemorrhage with loss of consciousness of unspecified duration, initial encounter: Secondary | ICD-10-CM

## 2013-11-18 ENCOUNTER — Ambulatory Visit (HOSPITAL_COMMUNITY)
Admission: RE | Admit: 2013-11-18 | Discharge: 2013-11-18 | Disposition: A | Discharge status: Home / Self Care | Payer: Medicare Other | Source: Ambulatory Visit | Attending: Neurosurgery | Admitting: Neurosurgery

## 2013-11-18 DIAGNOSIS — Z09 Encounter for follow-up examination after completed treatment for conditions other than malignant neoplasm: Secondary | ICD-10-CM | POA: Insufficient documentation

## 2013-11-18 DIAGNOSIS — G9389 Other specified disorders of brain: Secondary | ICD-10-CM | POA: Insufficient documentation

## 2013-11-18 DIAGNOSIS — G319 Degenerative disease of nervous system, unspecified: Secondary | ICD-10-CM | POA: Insufficient documentation

## 2013-11-18 DIAGNOSIS — S065XAA Traumatic subdural hemorrhage with loss of consciousness status unknown, initial encounter: Secondary | ICD-10-CM

## 2013-11-18 DIAGNOSIS — S065X9A Traumatic subdural hemorrhage with loss of consciousness of unspecified duration, initial encounter: Secondary | ICD-10-CM

## 2013-11-22 ENCOUNTER — Ambulatory Visit: Payer: Medicare Other | Admitting: Neurology

## 2013-12-24 ENCOUNTER — Telehealth: Payer: Self-pay | Admitting: Pulmonary Disease

## 2013-12-24 NOTE — Telephone Encounter (Signed)
Called and spoke with pts wife and she stated that SN started the pt on amlodipine back in April and pt was told to follow up in 1 month after starting the med.  appt has been scheduled for the pt next week  6-2  At 11:30.  Pt is aware.

## 2013-12-28 ENCOUNTER — Encounter: Payer: Self-pay | Admitting: Pulmonary Disease

## 2013-12-28 ENCOUNTER — Ambulatory Visit (INDEPENDENT_AMBULATORY_CARE_PROVIDER_SITE_OTHER)
Admission: RE | Admit: 2013-12-28 | Discharge: 2013-12-28 | Disposition: A | Discharge status: Home / Self Care | Payer: Medicare Other | Source: Ambulatory Visit | Attending: Pulmonary Disease | Admitting: Pulmonary Disease

## 2013-12-28 ENCOUNTER — Ambulatory Visit (INDEPENDENT_AMBULATORY_CARE_PROVIDER_SITE_OTHER): Payer: Medicare Other | Admitting: Pulmonary Disease

## 2013-12-28 VITALS — BP 132/88 | HR 70 | Temp 97.6°F | Ht 68.0 in | Wt 218.0 lb

## 2013-12-28 DIAGNOSIS — I10 Essential (primary) hypertension: Secondary | ICD-10-CM

## 2013-12-28 DIAGNOSIS — M1990 Unspecified osteoarthritis, unspecified site: Secondary | ICD-10-CM

## 2013-12-28 DIAGNOSIS — N401 Enlarged prostate with lower urinary tract symptoms: Secondary | ICD-10-CM

## 2013-12-28 DIAGNOSIS — S065XAA Traumatic subdural hemorrhage with loss of consciousness status unknown, initial encounter: Secondary | ICD-10-CM

## 2013-12-28 DIAGNOSIS — N138 Other obstructive and reflux uropathy: Secondary | ICD-10-CM

## 2013-12-28 DIAGNOSIS — Z950 Presence of cardiac pacemaker: Secondary | ICD-10-CM

## 2013-12-28 DIAGNOSIS — R55 Syncope and collapse: Secondary | ICD-10-CM

## 2013-12-28 DIAGNOSIS — G4733 Obstructive sleep apnea (adult) (pediatric): Secondary | ICD-10-CM

## 2013-12-28 DIAGNOSIS — I495 Sick sinus syndrome: Secondary | ICD-10-CM

## 2013-12-28 DIAGNOSIS — G609 Hereditary and idiopathic neuropathy, unspecified: Secondary | ICD-10-CM

## 2013-12-28 DIAGNOSIS — F419 Anxiety disorder, unspecified: Secondary | ICD-10-CM

## 2013-12-28 DIAGNOSIS — R7309 Other abnormal glucose: Secondary | ICD-10-CM

## 2013-12-28 DIAGNOSIS — K5730 Diverticulosis of large intestine without perforation or abscess without bleeding: Secondary | ICD-10-CM

## 2013-12-28 DIAGNOSIS — Z23 Encounter for immunization: Secondary | ICD-10-CM

## 2013-12-28 DIAGNOSIS — S065X9A Traumatic subdural hemorrhage with loss of consciousness of unspecified duration, initial encounter: Secondary | ICD-10-CM

## 2013-12-28 DIAGNOSIS — E663 Overweight: Secondary | ICD-10-CM

## 2013-12-28 DIAGNOSIS — E7800 Pure hypercholesterolemia, unspecified: Secondary | ICD-10-CM

## 2013-12-28 DIAGNOSIS — K219 Gastro-esophageal reflux disease without esophagitis: Secondary | ICD-10-CM

## 2013-12-28 MED ORDER — AMLODIPINE BESYLATE 5 MG PO TABS: 5.0000 mg | ORAL_TABLET | Freq: Every day | ORAL | Status: DC

## 2013-12-28 MED ORDER — LOSARTAN POTASSIUM 100 MG PO TABS: 100.0000 mg | ORAL_TABLET | Freq: Every day | ORAL | Status: DC

## 2013-12-28 NOTE — Patient Instructions (Signed)
Today we updated your med list in our EPIC system...    Continue your current medications the same...  Today we did your follow up CXR... Please return to our lab one morning this week for your FASTING blood work...    We will contact you w/ the results when available...   We gave you the Pneumonia vaccine called PREVNAR-13 (one shot good for life)...  Let's get on track w/ our diet & exercise program- work on weight reduction!!!  Call for any questions...  Let's plan a follow up visit in 75mo, sooner if needed for problems.Marland KitchenMarland Kitchen

## 2013-12-30 ENCOUNTER — Other Ambulatory Visit (INDEPENDENT_AMBULATORY_CARE_PROVIDER_SITE_OTHER): Payer: Medicare Other

## 2013-12-30 DIAGNOSIS — M1990 Unspecified osteoarthritis, unspecified site: Secondary | ICD-10-CM

## 2013-12-30 DIAGNOSIS — F419 Anxiety disorder, unspecified: Secondary | ICD-10-CM

## 2013-12-30 DIAGNOSIS — F411 Generalized anxiety disorder: Secondary | ICD-10-CM

## 2013-12-30 DIAGNOSIS — I10 Essential (primary) hypertension: Secondary | ICD-10-CM

## 2013-12-30 DIAGNOSIS — I495 Sick sinus syndrome: Secondary | ICD-10-CM

## 2013-12-30 DIAGNOSIS — E7800 Pure hypercholesterolemia, unspecified: Secondary | ICD-10-CM

## 2013-12-30 DIAGNOSIS — K219 Gastro-esophageal reflux disease without esophagitis: Secondary | ICD-10-CM

## 2013-12-30 DIAGNOSIS — R7309 Other abnormal glucose: Secondary | ICD-10-CM

## 2013-12-30 LAB — HEPATIC FUNCTION PANEL
ALK PHOS: 58 U/L (ref 39–117)
ALT: 28 U/L (ref 0–53)
AST: 26 U/L (ref 0–37)
Albumin: 4.1 g/dL (ref 3.5–5.2)
BILIRUBIN DIRECT: 0.1 mg/dL (ref 0.0–0.3)
BILIRUBIN TOTAL: 1 mg/dL (ref 0.2–1.2)
TOTAL PROTEIN: 7 g/dL (ref 6.0–8.3)

## 2013-12-30 LAB — LIPID PANEL
CHOL/HDL RATIO: 3
Cholesterol: 157 mg/dL (ref 0–200)
HDL: 50.7 mg/dL (ref >39.00)
LDL Cholesterol: 93 mg/dL (ref 0–99)
NONHDL: 106.3
Triglycerides: 69 mg/dL (ref 0.0–149.0)
VLDL: 13.8 mg/dL (ref 0.0–40.0)

## 2013-12-30 LAB — CBC WITH DIFFERENTIAL/PLATELET
BASOS ABS: 0.1 10*3/uL (ref 0.0–0.1)
BASOS PCT: 0.8 % (ref 0.0–3.0)
EOS ABS: 0.9 10*3/uL — AB (ref 0.0–0.7)
Eosinophils Relative: 13.8 % — ABNORMAL HIGH (ref 0.0–5.0)
HCT: 39.8 % (ref 39.0–52.0)
HEMOGLOBIN: 13.1 g/dL (ref 13.0–17.0)
LYMPHS PCT: 35.2 % (ref 12.0–46.0)
Lymphs Abs: 2.3 10*3/uL (ref 0.7–4.0)
MCHC: 33.1 g/dL (ref 30.0–36.0)
MCV: 92.3 fl (ref 78.0–100.0)
Monocytes Absolute: 0.8 10*3/uL (ref 0.1–1.0)
Monocytes Relative: 11.9 % (ref 3.0–12.0)
Neutro Abs: 2.5 10*3/uL (ref 1.4–7.7)
Neutrophils Relative %: 38.3 % — ABNORMAL LOW (ref 43.0–77.0)
Platelets: 255 10*3/uL (ref 150.0–400.0)
RBC: 4.3 Mil/uL (ref 4.22–5.81)
RDW: 15.1 % (ref 11.5–15.5)
WBC: 6.7 10*3/uL (ref 4.0–10.5)

## 2013-12-30 LAB — BASIC METABOLIC PANEL
BUN: 15 mg/dL (ref 6–23)
CHLORIDE: 104 meq/L (ref 96–112)
CO2: 28 mEq/L (ref 19–32)
Calcium: 9.2 mg/dL (ref 8.4–10.5)
Creatinine, Ser: 1.1 mg/dL (ref 0.4–1.5)
GFR: 71.03 mL/min (ref >60.00)
Glucose, Bld: 121 mg/dL — ABNORMAL HIGH (ref 70–99)
Potassium: 4.3 mEq/L (ref 3.5–5.1)
SODIUM: 137 meq/L (ref 135–145)

## 2013-12-30 LAB — HEMOGLOBIN A1C: HEMOGLOBIN A1C: 6.4 % (ref 4.6–6.5)

## 2013-12-30 LAB — TSH: TSH: 1.73 u[IU]/mL (ref 0.35–4.50)

## 2013-12-31 LAB — VITAMIN D 25 HYDROXY (VIT D DEFICIENCY, FRACTURES): Vit D, 25-Hydroxy: 49 ng/mL (ref 30–89)

## 2014-01-04 DIAGNOSIS — I495 Sick sinus syndrome: Secondary | ICD-10-CM

## 2014-01-06 ENCOUNTER — Ambulatory Visit (INDEPENDENT_AMBULATORY_CARE_PROVIDER_SITE_OTHER): Payer: Medicare Other | Admitting: *Deleted

## 2014-01-06 DIAGNOSIS — I495 Sick sinus syndrome: Secondary | ICD-10-CM

## 2014-01-06 NOTE — Progress Notes (Signed)
Remote pacemaker transmission.   

## 2014-01-07 ENCOUNTER — Telehealth: Payer: Self-pay | Admitting: Pulmonary Disease

## 2014-01-07 NOTE — Telephone Encounter (Signed)
Called and spoke with pt and he is aware of lab results per SN. Pt voiced his understanding and nothing further is needed. 

## 2014-01-18 LAB — MDC_IDC_ENUM_SESS_TYPE_REMOTE
Battery Impedance: 1716 Ohm
Battery Remaining Longevity: 31 mo
Battery Voltage: 2.73 V
Brady Statistic AP VS Percent: 0 %
Brady Statistic AS VP Percent: 0 %
Brady Statistic AS VS Percent: 100 %
Date Time Interrogation Session: 20150609133112
Lead Channel Impedance Value: 637 Ohm
Lead Channel Impedance Value: 739 Ohm
Lead Channel Pacing Threshold Amplitude: 0.625 V
Lead Channel Pacing Threshold Amplitude: 1 V
Lead Channel Pacing Threshold Pulse Width: 0.4 ms
Lead Channel Pacing Threshold Pulse Width: 0.4 ms
Lead Channel Setting Pacing Amplitude: 2.5 V
MDC IDC MSMT LEADCHNL RA SENSING INTR AMPL: 0.7 mV
MDC IDC MSMT LEADCHNL RV SENSING INTR AMPL: 5.6 mV
MDC IDC SET LEADCHNL RA PACING AMPLITUDE: 2 V
MDC IDC SET LEADCHNL RV PACING PULSEWIDTH: 0.4 ms
MDC IDC SET LEADCHNL RV SENSING SENSITIVITY: 2 mV
MDC IDC STAT BRADY AP VP PERCENT: 0 %

## 2014-01-31 ENCOUNTER — Other Ambulatory Visit: Payer: Self-pay | Admitting: Pulmonary Disease

## 2014-02-01 ENCOUNTER — Encounter: Payer: Self-pay | Admitting: Cardiology

## 2014-02-08 ENCOUNTER — Encounter: Payer: Self-pay | Admitting: Internal Medicine

## 2014-03-05 NOTE — Progress Notes (Signed)
Subjective:    Patient ID: Tony Cruz, male    DOB: 1933/09/08, 80 y.o.   MRN: [id number]  HPI 80 y/o WM here for a follow up visit... he has multiple medical problems as noted below...  ~  SEE PREV EPIC NOTES FOR OLDER DATA >>   ~  June 04, 2012:  32mo ROV & Tony Cruz needs a face-to-face visit for a new CPAP machine & supplies; he was applying thru the [facility], has been using [facility] & received a new machine 9/13- we requested download w/ compliance report dates 10/7- 06/02/12 showing 100% compliance, >4H on 18d w/ median usage ~5H/night; CPAP set at 11 w/ AHI<1 on his machine... Note> similar number provided for the month before as well...    He is also c/o LBP and is requesting referral for PT> he has been seeing his chiropractor & notes that the adjustments are of little help; he started working w/ a physical therapist- doing McKenzie exercises & wants referral for this purpose...    He also notes slow progression of his periph neuropathy symptoms w/ decr sensation & balance is poor; he was concerned that Neuro has signed off & I offered him a second opinion consult if he requests...    We reviewed his mult somatic complaints as well- stomach rolling, had f/u DrEskridge 9/13- reviewed, etc... We reviewed prob list, meds, xrays and labs> see below for updates >> OK Flu shot & TDAP today...  ~  January 25, 2013:  24mo ROV & [name] has mult somatic complaints & "issues w/ aging" including arthritis, LBP, neuropathy> he & wife were exposed to a bat in their house and got a series of rabies vaccinations thru the ER... we reviewed the following medical problems during today's office visit >>     OSA> his eval 7 initial therapy was done by the [place]; followed by DrClance w/ good compliance & no issues w/ sleep or daytime hypersomnolence...     HBP> on ASA81, Hyzaar100-12.5; BP= 126/84 & he denies CP, palpit, SOB, edema...    HxSyncope, sinus node dysfunction, Pacer> followed by DrKlein, pacer function  normally, no issues ident...    BorderlineDM> on diet alone; Labs 7/14 showed BS=114, A1c=6.6; we reviewed diet, exercise, wt reduction...     Overweight> weight= 214# and BMI= 32...    GI- GERD, Barrett's, Divertics, IBS> on Prilosec20Bid; known [place], HxBarrett's, prev Rx by DrWeissman & he would like to see DrPyrtle at [facility]...    GU- BPH> on Flomax0.4 & Proscar5; followed by DrRDavis=> DrEskridge    DJD, LBP> on Naprosyn, Tramadol; c/o hands and ankles stiff but he still plays golf; eval by [employer] & he released him "I've done all I can do for you"    Periph neuropathy>     Skin cancer> he had a squamous cell removed from his left cheek 5/14 by DrDJones... We reviewed prob list, meds, xrays and labs> see below for updates >>  LABS 7/14:  FLP- ok x TG=190 on diet alone;  Chems- ok x BS=114 A1c=6.6;  CBC- wnl;  TSH=1.27;  PSA=0.89;  Uric=5.9.Marland KitchenMarland Kitchen  ~  December 28, 2013:  13yr [name] states he had a "crummy year"> notes that he fell on the ice 2/15 & hit his head, went to the ER w/ skull fx & subdural hematoma;  He has seen DrAquino for [facility], DrNudelman w/ conservative management but he developed residual HA, dizziness, and BPPV- ultimately sent to DrShoemaker w/ Epley maneuvers & finally  improved...  He has also had BP issues w/ pressure in the 150/90 range so we called in Amlodipine 5mg /d- now BP improved to the 130/80 range...  We reviewed the following medical problems during today's office visit >>     OSA> his eval & initial therapy was done by the [place]; followed by DrClance w/ good compliance & no issues w/ sleep or daytime hypersomnolence...     HBP> on ASA81, Cozaar100 & Amlod5; BP= 132/88 & he denies CP, palpit, SOB, edema; DrKlein stopped his Hct due to dizziness......    HxSyncope, sinus node dysfunction, Pacer> followed by DrKlein, pacer function normally, no issues ident...    BorderlineDM> on diet alone; Labs 6/15 showed BS=121, A1c=6.4; we reviewed diet, exercise, wt  reduction...     Overweight> weight= 218# and BMI= 33... We reviewed diet, exercise, wt reduction strategies...    GI- GERD, Barrett's, Divertics, IBS> on Prilosec20; known [place], HxBarrett's, prev Rx by DrWeissman & now DrPyrtle at [facility] w/ EGD 11/14 showing gastritis, esophagitis, gastric polyps (path=+Barretts, reflux related injury, neg HPylori); colonoscopy 11/14 showing mod divertics, & 88mm polyp in cecum (tub adenoma)...    GU- BPH> on Flomax0.4 & Proscar5; followed by DrRDavis=> DrEskridge    DJD, LBP> on Naprosyn, Tramadol; c/o hands and ankles stiff but he still plays golf; eval by Kirby Funk & he released him "I've done all I can do for you"    Fall 2/15 w/ nondisplaced skull fx and subdural hematoma> treated conservatively, he had some resid HA, dizzy, etc & treated w/ Epley maneuvers- improved...    Periph neuropathy> thorough eval 2011 by DrReynolds & Hickling- idiopathic; not on specific therapy for this...    Skin cancer> he had a squamous cell removed from his left cheek 5/14 by DrDJones... We reviewed prob list, meds, xrays and labs> see below for updates >> we gave him the PREVNAR-13 vaccination today...  CXR 6/15 showed normal heart size, mild tortuous Ao, pacer, clear lungs, NAD...  LABS 6/15:  FLP- wnl on diet;  Chems- wnl x BS=121, A1c=6.4;  CBC- wnl;  TSH=1.73;  VitD=49...            Problem List:   OBSTRUCTIVE SLEEP APNEA (ICD-327.23) - evaluation, diagnosis, and Rx by the [facility] (their notes scanned into the EMR):  he had PSG 4/08 w/ AHI= 26, desat to 83%... placed on CPAP at 11cm H20 pressure... he also has some RLS & was on Requip transiently from the [place]. ~  4/11: pt indicates that he uses his CPAP 4/7 nights for 6-7 hr/night; he feels that he rests well, denies daytime hypersom, snoring, etc... he requests Sleep consult here in [place] (see DrClance consult). ~  5/13: he saw DrClance for Sleep f/u> noted 10# wt gain, pt reported using CPAP compliantly w/ good daytime  alertness, no issues w/ mask but requesting new machine ([employer]) as his is >74yrs old; requested to work on wt reduction...  ALLERGIC RHINITIS (ICD-477.9) - controlled on Claritin, and OTC meds...   HYPERTENSION (ICD-401.9) - on ASA 81mg /d & HCTZ 25mg /d...  ~  CXR 4/11 is clear and WNL, NAD... ~  6/12:  BP=122/82 today and he denies HA, fatigue, visual changes, CP, palipit, dizziness, syncope, dyspnea, edema, etc... ~  6/13:  BP= 142/98 & he admits intermittently elev at home; we discussed change med to LOSARTAN/ HCT 100/12.5 daily; he will monitor BP & f/u here... ~  11/13:  BP= 132/88 & similar at home; denies CP, palpit, SOB, edema... ~  6/14:  BP= 126/84 & he remains asymptomatic... ~  6/15:  on ASA81, Cozaar100 & Amlod5; BP= 132/88 & he denies CP, palpit, SOB, edema; DrKlein stopped his Hct due to dizziness.  Hx of SYNCOPE (ICD-780.2) & CARDIAC PACEMAKER IN SITU (ICD-V45.01) - he had pacer changed 11/08 DrKlein (hosp DC Summary reviewed)... he continues regular pacer checks and f/u w/ DrKlein...  ~  cath 3/99 DrJoeLeB showed norm coronaries, norm LV, norm Ao... ~  He saw DrKlein 12/11 for f/u syncope secondary to sinus node dysfunction, treated w/ pacer & generator replaced 2008;  He noted intercurrent syncope that was felt to be vasovagal in origin (this occurred in the Cards clinic 8/11 while waiting for his wife, & he has a long hx of this from blood draws over the yrs) & DrKlein reprogramed his device; EKG w/ SBrady & otherw WNL... ~  He saw DrKlein for Cards/EP f/u 12/12> hx syncope related to [facility] dysfunction- devise working properly & no changes made to the programming; TSH & sed rate wetre checked and WNL... ~  Yearly f/u DrKlein 12/13> doing satis w/o intercurrent syncope, pacer working satis & f/u planned 14yr... ~  12/14: he had yearly ROV w/ DrKlein, doing satis & pacer OK, no changes made...  DIABETES MELLITUS, BORDERLINE (ICD-790.29) - he's been concerned about his BS due to  [facility] and his neuropathy symptoms, but his BS has only been borderline elvated prev...  ~  labs 4/10 (wt=209#) showed BS= 113, A1c= 6.3.Marland Kitchen. rec> better diet, get wt down. ~  labs 4/11 (wt=222#) showed BS= 109, A1c= 6.2.Marland Kitchen. no meds yet, just needs to get wt down. ~  Labs 6/12 (wt=204#) showed BS= 102, A1v= 6.4 ~  Labs 6/13 (wt=213#) showed BS= 105, A1c= 6.4 ~  Labs 6/14 showed BS= 114, A1c= 6.6 ~  Labs 6/15 showed BS= 121, A1c= 6.4  OVERWEIGHT (ICD-278.02) - we discussed diet + exercise therapy (again)... ~  weight 4/09 = 226#.Marland KitchenMarland Kitchen 5" 9" Tall for a BMI= 33-34 ~  weight 4/10 = 209#... BMI down to 31... keep up the good work. ~  weight 4/11 = 222# .Marland KitchenMarland Kitchen BMI up to 33. ~  Weight 6/12 = 204# ~  Weight 6/13 = 213# ~  Weight 6/14 = 214# ~  Weight 6/15 = 218#  GASTROESOPHAGEAL REFLUX DISEASE (ICD-530.81) w/ BARRETT'S ESOPH - followed by DrWeissman & Rx w/ OMEPRAZOLE 20mg Bid... ~  prev EGD's documented a [place] & Barrett's esoph... (last 1/03 by DrPatterson). ~  last EGD was 2/08 showing Barrett's epithelium in lower 1/3 of esoph... f/u planned 53yrs. ~  4/11:  NOTE> f/u EGD is due, he will decide on gastroenterologist ==> he saw DrMJohnson w/ EGD 5/11 showing Irreg Z-line & bx c/w Barrett's esoph (no dysplasia), the rest of the esoph, stomach, & duodenum were WNL... ~  EGD 11/14 by DrPyrtle showed gastritis, esophagitis, gastric polyps (path=+Barretts, reflux related injury, neg HPylori)...  DIVERTICULOSIS OF COLON (ICD-562.10)  IRRITABLE BOWEL SYNDROME (ICD-564.1) ~  last colonoscopy 2/08 by DrWeissman showed left sided divertics, otherw neg... f/u planned 53yrs... ~  Colonoscopy 11/14 by DrPyrtle showed mod divertics, & 24mm polyp in cecum (tub adenoma)...  BENIGN PROSTATIC HYPERTROPHY, WITH OBSTRUCTION (ICD-600.01) - on FLOMAX 0.4mg /d & PROSCAR 5mg /d... eval and Rx by [facility] who checks pt q41mo (we don't have notes from him)... pt tells me he will be seeing DrDavis regularly when he moves to [facility]. ~   9/13: pt reports that he is now seeing DrEskridge at [facility] &  his PSAs have all been normal; his major prob has been holding his urine & eval revealed not emptying completely- they have tried several meds to help this; he will have notes sent to [place] for [facility] records.  OSTEOARTHRITIS (ICD-715.90) > managed by DrAnderson & difficult problem, thought to have an inflamm arthropathy (?variation of psoriatic arthritis) however he did not respond to NSAIDs/Celebrex, Pred, MTX, anti-TNF inhibitor; on Tramadol prn;  BACK PAIN (ICD-724.5) - pt saw DrBeane 6/10 for eval> neuropathy symptoms & lumbar spondylosis on XRays, Rx Mobic Prn... offered Myelogram for further eval... he also takes Calcium & Vit D. ~  He was evaluated by Kirby Funk for Rheum...  FALL w/ SUBDURAL HEMATOMA >> treated conservatively, he had some resid HA, dizzy, etc & treated w/ Epley maneuvers- improved CT Head 4/15 showed mild atrophy & sm vessel dis & scat lacunes, prev left parietal extra-axial fluid collection is resolved...   PERIPHERAL NEUROPATHY (ICD-356.9) - DrReynolds evaluated him w/ NCV's etc and dx a peripheral neuropathy... this is one of his CC> numbness, decr sensation in feet, (no pain) w/ some assoc balance problems intermittently...  he tried Mentanx but no benefit...  ~  4/11: I reviewed DrReynolds 11/10 note w/ the patient... ~  He also had a thorough neurologic evaluation 10/11 from one of his church physicians, DrHickling, for f/u polyneuropathy> balance issues, LBP, gait abn, variable paresthesias, foot numbness (sl decr vibration & pin prick), some subjective memory problems w/ norm MMSE, abnormal NCVs;  Prev Labs were all WNL, unrevealing;  etiology of his neuropathy is unknown= cryptogenic, and since it isn't painful he really didn't require any therapy... ~  6/13: he continues to experience prob w/ balance etc related to his neuropathy; not progressive & still not painful; also notes nocturnal leg cramps &  he's tried bar of soap trick, mustard, etc- all w/o relief (we rec trial Tonic water)... ~  2015> Neuro follow up by DrAquino for [facility]...  Health Maintenance - he takes a number of Vits + lecithin, saw palmetto, Fish Oil, etc... he saw DrDJones in 2010 for Derm review- pt reports nothing major found...   Past Surgical History  Procedure Laterality Date  . Appendectomy    . Cholecystectomy    . Tonsillectomy    . Pacemaker placement    . Skin cancer removed  12/2012    removed from his face--Dr. Sarajane Jews    Outpatient Encounter Prescriptions as of 12/28/2013  Medication Sig  . amLODipine (NORVASC) 5 MG tablet Take 1 tablet (5 mg total) by mouth daily.  . calcium carbonate (OS-CAL) 600 MG TABS Take 600 mg by mouth daily.    . Cholecalciferol (VITAMIN D) 1000 UNITS capsule Take 1,000 Units by mouth daily.    . finasteride (PROSCAR) 5 MG tablet Take 1 tablet by mouth daily.  . Lecithin 1200 MG CAPS Take 1 capsule by mouth daily.    Marland Kitchen loratadine (CLARITIN) 10 MG tablet Take 10 mg by mouth daily.    Marland Kitchen losartan (COZAAR) 100 MG tablet Take 1 tablet (100 mg total) by mouth daily.  . Multiple Vitamin (MULTIVITAMIN) capsule Take 1 capsule by mouth daily.    . Omega-3 Fatty Acids (DIALYVITE OMEGA-3 CONCENTRATE) 600 MG CAPS Take 1 capsule by mouth daily.  Marland Kitchen omeprazole (PRILOSEC) 20 MG capsule Take 20 mg by mouth daily.  . Tamsulosin HCl (FLOMAX) 0.4 MG CAPS Take 0.4 mg by mouth daily.    . [DISCONTINUED] amLODipine (NORVASC) 5 MG tablet Take 1 tablet (  5 mg total) by mouth daily.  . [DISCONTINUED] losartan (COZAAR) 100 MG tablet Take 100 mg by mouth daily.  Marland Kitchen acetaminophen (TYLENOL) 500 MG tablet Take 1,000 mg by mouth every 6 (six) hours as needed for pain.  Marland Kitchen aspirin 81 MG tablet Take 81 mg by mouth daily.  . [DISCONTINUED] nortriptyline (PAMELOR) 10 MG capsule Take 1 capsule (10 mg total) by mouth at bedtime.    Allergies  Allergen Reactions  . Shellfish Allergy     Sensitive to  shellfish    Current Medications, Allergies, Past Medical History, Past Surgical History, Family History, and Social History were reviewed in [employer] record.   Review of Systems        The patient complains of sleep disorder, dyspnea on exertion, gas/bloating, joint pain, stiffness, arthritis, paresthesias, and difficulty walking.  The patient denies fever, chills, sweats, anorexia, fatigue, weakness, malaise, weight loss, blurring, diplopia, eye irritation, eye discharge, vision loss, eye pain, photophobia, earache, ear discharge, tinnitus, decreased hearing, nasal congestion, nosebleeds, sore throat, hoarseness, chest pain, palpitations, syncope, orthopnea, PND, peripheral edema, cough, dyspnea at rest, excessive sputum, hemoptysis, wheezing, pleurisy, nausea, vomiting, diarrhea, constipation, change in bowel habits, abdominal pain, melena, hematochezia, jaundice, indigestion/heartburn, dysphagia, odynophagia, dysuria, hematuria, urinary frequency, urinary hesitancy, nocturia, incontinence, back pain, joint swelling, muscle cramps, muscle weakness, sciatica, restless legs, leg pain at night, leg pain with exertion, rash, itching, dryness, suspicious lesions, paralysis, seizures, tremors, vertigo, transient blindness, frequent falls, frequent headaches, depression, anxiety, memory loss, confusion, cold intolerance, heat intolerance, polydipsia, polyphagia, polyuria, unusual weight change, abnormal bruising, bleeding, enlarged lymph nodes, urticaria, allergic rash, hay fever, and recurrent infections.     Objective:   Physical Exam     WD, WN, 80 y/o WM in NAD... GENERAL:  Alert & oriented; pleasant & cooperative... HEENT:  [place]/AT, EOM-wnl, PERRLA, EACs-clear, TMs-wnl, NOSE-clear, THROAT-clear & wnl. NECK:  Supple w/ fairROM; no JVD; normal carotid impulses w/o bruits; no thyromegaly or nodules palpated; no lymphadenopathy. CHEST:  Clear to P & A; without wheezes/ rales/  or rhonchi. HEART:  Regular Rhythm; without murmurs/ rubs/ or gallops; pacer in left shoulder area... ABDOMEN:  Soft & nontender; normal bowel sounds; no organomegaly or masses detected. EXT: without deformities, mild arthritic changes; no varicose veins/ +venous insuffic/ no edema. NEURO:  CN's intact;  fair gait;  Motor normal, sensory variable deficits... strength seems OK, min decr sensation in LEs. DERM:  No lesions noted; no rash etc...  RADIOLOGY DATA:  Reviewed in the EPIC EMR & discussed w/ the patient...  LABORATORY DATA:  Reviewed in the EPIC EMR & discussed w/ the patient...   Assessment & Plan:    OSA>  Followed by DrClance & stable on CPAP; we will try to get him a new machine thru [facility]...  HBP>  His BP is improved on Cozaar & Amlodipine, continue same...  Hx Syncope/ Pacer>  Followed by DrKlein & pacer functioning normally...  DM, borderline>  On diet alone & managing well, A1c remains 6.4 & we reviewed the import of wt reduction...  GERD, Hx Barrett's>  S/p EGD from DrPyrtle w/ small segment Barrett's mucosa, no dysplasia...  Divertics, IBS>  Colonoscopy 11/14 w/ 1mm tub adenoma removed...  BPH>  Followed by DrEskridge at [facility]; biggest issue is control & he says it was from not emptying completely, they have tried several meds and continue to follow up regularly, he reports PSAs have been ok; he will get notes to [place]...  DJD>  Followed by Kirby Funk & still having difficulty w/ pain & stiffness...  Periph Neuropathy>  Eval by Neuro, cryptogenic, no meds needed & symptoms remain the same...   Patient's Medications  New Prescriptions   No medications on file  Previous Medications   ACETAMINOPHEN (TYLENOL) 500 MG TABLET    Take 1,000 mg by mouth every 6 (six) hours as needed for pain.   ASPIRIN 81 MG TABLET    Take 81 mg by mouth daily.   CALCIUM CARBONATE (OS-CAL) 600 MG TABS    Take 600 mg by mouth daily.     CHOLECALCIFEROL (VITAMIN D) 1000 UNITS  CAPSULE    Take 1,000 Units by mouth daily.     FINASTERIDE (PROSCAR) 5 MG TABLET    Take 1 tablet by mouth daily.   LECITHIN 1200 MG CAPS    Take 1 capsule by mouth daily.     LORATADINE (CLARITIN) 10 MG TABLET    Take 10 mg by mouth daily.     MULTIPLE VITAMIN (MULTIVITAMIN) CAPSULE    Take 1 capsule by mouth daily.     OMEGA-3 FATTY ACIDS (DIALYVITE OMEGA-3 CONCENTRATE) 600 MG CAPS    Take 1 capsule by mouth daily.   OMEPRAZOLE (PRILOSEC) 20 MG CAPSULE    Take 20 mg by mouth daily.   TAMSULOSIN HCL (FLOMAX) 0.4 MG CAPS    Take 0.4 mg by mouth daily.    Modified Medications   Modified Medication Previous Medication   AMLODIPINE (NORVASC) 5 MG TABLET amLODipine (NORVASC) 5 MG tablet      Take 1 tablet (5 mg total) by mouth daily.    Take 1 tablet (5 mg total) by mouth daily.   AMLODIPINE (NORVASC) 5 MG TABLET amLODipine (NORVASC) 5 MG tablet      TAKE 1 TABLET (5 MG TOTAL) BY MOUTH DAILY.    Take 1 tablet (5 mg total) by mouth daily.   LOSARTAN (COZAAR) 100 MG TABLET losartan (COZAAR) 100 MG tablet      Take 1 tablet (100 mg total) by mouth daily.    Take 100 mg by mouth daily.  Discontinued Medications   NORTRIPTYLINE (PAMELOR) 10 MG CAPSULE    Take 1 capsule (10 mg total) by mouth at bedtime.

## 2014-04-04 ENCOUNTER — Ambulatory Visit (INDEPENDENT_AMBULATORY_CARE_PROVIDER_SITE_OTHER): Payer: Medicare Other

## 2014-04-04 ENCOUNTER — Ambulatory Visit (INDEPENDENT_AMBULATORY_CARE_PROVIDER_SITE_OTHER): Payer: Medicare Other | Admitting: Family Medicine

## 2014-04-04 VITALS — BP 118/64 | HR 73 | Temp 98.4°F | Resp 17 | Ht 67.5 in | Wt 201.0 lb

## 2014-04-04 DIAGNOSIS — H9202 Otalgia, left ear: Secondary | ICD-10-CM

## 2014-04-04 DIAGNOSIS — H9209 Otalgia, unspecified ear: Secondary | ICD-10-CM

## 2014-04-04 DIAGNOSIS — M436 Torticollis: Secondary | ICD-10-CM

## 2014-04-04 DIAGNOSIS — Z23 Encounter for immunization: Secondary | ICD-10-CM

## 2014-04-04 NOTE — Progress Notes (Signed)
[facility] [address] [place] [address] [phone]  Date:  04/04/2014   Name:  Tony Cruz   DOB:  11/09/1933   MRN:  [id number]  PCP:  [name], MD    Chief Complaint: Otalgia   History of Present Illness:  Tony Cruz is a 80 y.o. very pleasant male patient who presents with the following:  Here today as a new patient- it is a holiday so his PCP is closed.  His left ear has hurt for 2 or 3 days.  It is getting worse, the pain will come and go.   He is losing some of his hearing, but has not noted that this has changed since the start of the earache.   He did notice some ringing this am only.  This is no longer present He also has noted that his left eye is watering some. No ST.  No fever, cough or runny nose.  He has not been swimming.  Admits that he has noted some pain in his teeth over the last few months, but was seen by his DDS not too long ago and all looked ok.  He has not noted a ST.     He has a pacemaker due to bradycardia/ syncope but never had MI or CAD.  He has not noted any CP He did fall and sustained a subdural hematoma in February of this year.  By his last CT scan in April this was cleared. He has noted some popping and craking in his neck here recently and wonders if he has some arthritis.    Patient Active Problem List   Diagnosis Date Noted  . Subdural hematoma 09/30/2013  . Headache(784.0) 09/30/2013  . Sinoatrial node dysfunction 06/01/2013  . Exposure to bat without known bite 09/28/2012  . Pacemaker-MDT 07/02/2011  . Night sweats 07/02/2011  . DIZZINESS 03/22/2010  . DIABETES MELLITUS, BORDERLINE 11/02/2009  . OBSTRUCTIVE SLEEP APNEA 11/21/2008  . BACK PAIN 11/03/2008  . OVERWEIGHT 11/02/2007  . HYPERTENSION 11/02/2007  . ALLERGIC RHINITIS 11/02/2007  . GASTROESOPHAGEAL REFLUX DISEASE 11/02/2007  . DIVERTICULOSIS OF COLON 11/02/2007  . BENIGN PROSTATIC HYPERTROPHY, WITH OBSTRUCTION 11/02/2007  . SYNCOPE  11/02/2007  . PERIPHERAL NEUROPATHY 10/30/2007  . IRRITABLE BOWEL SYNDROME 10/30/2007  . OSTEOARTHRITIS 10/30/2007    Past Medical History  Diagnosis Date  . Obstructive sleep apnea   . hypertension   . Syncope and collapse   . Pacemaker-MDT     Change out 2008  . Other abnormal glucose   . Overweight(278.02)   . Esophageal reflux   . Diverticulosis of colon (without mention of hemorrhage)   . Irritable bowel syndrome   . Hypertrophy of prostate with urinary obstruction and other lower urinary tract symptoms (LUTS)   . Osteoarthrosis, unspecified whether generalized or localized, unspecified site   . Backache, unspecified   . Unspecified hereditary and idiopathic peripheral neuropathy   . Anal fissure   . Allergy     Past Surgical History  Procedure Laterality Date  . Appendectomy    . Cholecystectomy    . Tonsillectomy    . Pacemaker placement    . Skin cancer removed  12/2012    removed from his face--Dr. Sarajane Jews    History  Substance Use Topics  . Smoking status: Never Smoker   . Smokeless tobacco: Never Used  . Alcohol Use: Yes    Family History  Problem Relation Age of Onset  . Coronary artery  disease Father     bladder cancer  . Coronary artery disease Mother   . Diabetes Sister     2 sisters    Allergies  Allergen Reactions  . Shellfish Allergy     Sensitive to shellfish    Medication list has been reviewed and updated.  Current Outpatient Prescriptions on File Prior to Visit  Medication Sig Dispense Refill  . acetaminophen (TYLENOL) 500 MG tablet Take 1,000 mg by mouth every 6 (six) hours as needed for pain.      Marland Kitchen amLODipine (NORVASC) 5 MG tablet Take 1 tablet (5 mg total) by mouth daily.  90 tablet  2  . amLODipine (NORVASC) 5 MG tablet TAKE 1 TABLET (5 MG TOTAL) BY MOUTH DAILY.  30 tablet  1  . aspirin 81 MG tablet Take 81 mg by mouth daily.      . calcium carbonate (OS-CAL) 600 MG TABS Take 600 mg by mouth daily.        . Cholecalciferol  (VITAMIN D) 1000 UNITS capsule Take 1,000 Units by mouth daily.        . finasteride (PROSCAR) 5 MG tablet Take 1 tablet by mouth daily.      . Lecithin 1200 MG CAPS Take 1 capsule by mouth daily.        Marland Kitchen loratadine (CLARITIN) 10 MG tablet Take 10 mg by mouth daily.        Marland Kitchen losartan (COZAAR) 100 MG tablet Take 1 tablet (100 mg total) by mouth daily.  90 tablet  2  . Multiple Vitamin (MULTIVITAMIN) capsule Take 1 capsule by mouth daily.        . Omega-3 Fatty Acids (DIALYVITE OMEGA-3 CONCENTRATE) 600 MG CAPS Take 1 capsule by mouth daily.      Marland Kitchen omeprazole (PRILOSEC) 20 MG capsule Take 20 mg by mouth daily.      . Tamsulosin HCl (FLOMAX) 0.4 MG CAPS Take 0.4 mg by mouth daily.         No current facility-administered medications on file prior to visit.    Review of Systems:  As per HPI- otherwise negative.   Physical Examination: Filed Vitals:   04/04/14 0842  BP: 118/64  Pulse: 73  Temp: 98.4 F (36.9 C)  Resp: 17   Filed Vitals:   04/04/14 0842  Height: 5' 7.5" (1.715 m)  Weight: 201 lb (91.173 kg)   Body mass index is 31 kg/(m^2). Ideal Body Weight: Weight in (lb) to have BMI = 25: 161.7  GEN: WDWN, NAD, Non-toxic, A & O x 3, overweight, looks well HEENT: Atraumatic, Normocephalic. Neck supple. No masses, No LAD.  Bilateral TM wnl, oropharynx normal.  PEERL,EOMI.   Ears and Nose: No external deformity. CV: RRR, No M/G/R. No JVD. No thrill. No extra heart sounds. PULM: CTA B, no wheezes, crackles, rhonchi. No retractions. No resp. distress. No accessory muscle use. ABD: S, NT, ND, +BS. No rebound. No HSM. EXTR: No c/c/e NEURO Normal gait.  PSYCH: Normally interactive. Conversant. Not depressed or anxious appearing.  Calm demeanor.  Applied some AB otic drops to left ear canal but this did not change his sx  EKG:  Compared to EKG from 06/2013.  No acute change, but he does have RBBB  [facility] reading (PRIMARY) by  Dr. Lorelei Pont. C spine: degenerative change especially at  C5/6/7.   CERVICAL SPINE 4+ VIEWS  COMPARISON: None.  FINDINGS: The cervical spine is visualized from the occiput to the cervicothoracic junction. There is slight straightening  of the normal cervical lordosis without subluxation or fracture. Vertebral body height is maintained. Prevertebral soft tissues are within normal limits.  Endplate degenerative changes and loss of disc space height are worst at C5-6 and C6-7. Multilevel facet sclerosis/ hypertrophy. Neural foramina are difficult to assess given slight over rotation. Visualized lung apices are unremarkable.  IMPRESSION: 1. Multilevel spondylosis, worst at C5-6 and C6-7. 2. Slight straightening of the normal cervical lordosis without subluxation or fracture.  Assessment and Plan: Left ear pain - Plan: EKG 12-Lead  Neck stiffness - Plan: DG Cervical Spine Complete  Flu vaccine need - Plan: Flu Vaccine QUAD 36+ mos IM  Here today with ear pain of unclear etiology.  Does not appear to be OM or OE.  He did have some tinnitus but this is resolved.  May be as simple as ETD, but cautioned him to follow-up if his sx do not improve soon.  He is already established with ENT (Dr. Wilburn Cornelia) and will call him for follow-up if tinnitus returns.  Also suggested that he recheck with his DDS.  In the meantime he may use OTC ibuprofen as needed for the next few days.   Gave flu shot. He does have OA in his neck- encouraged him to stay active  Signed Lamar Blinks, MD

## 2014-04-04 NOTE — Patient Instructions (Signed)
Good to see you today!  I hope that you feel better soon.  Please give me a call if you do not feel better in the next few days- Sooner if worse.   If you do not feel better we may want to have you see your ENT doctor, especially if you have any buzzing or ringing in your ear again.

## 2014-04-11 ENCOUNTER — Ambulatory Visit (INDEPENDENT_AMBULATORY_CARE_PROVIDER_SITE_OTHER): Payer: Medicare Other | Admitting: *Deleted

## 2014-04-11 DIAGNOSIS — I495 Sick sinus syndrome: Secondary | ICD-10-CM

## 2014-04-11 NOTE — Progress Notes (Signed)
Remote pacemaker transmission.   

## 2014-04-15 LAB — MDC_IDC_ENUM_SESS_TYPE_REMOTE
Battery Remaining Longevity: 28 mo
Brady Statistic AP VP Percent: 0 %
Brady Statistic AP VS Percent: 0 %
Brady Statistic AS VP Percent: 0 %
Lead Channel Impedance Value: 641 Ohm
Lead Channel Pacing Threshold Pulse Width: 0.4 ms
Lead Channel Sensing Intrinsic Amplitude: 0.7 mV
Lead Channel Sensing Intrinsic Amplitude: 5.6 mV
Lead Channel Setting Pacing Amplitude: 2 V
Lead Channel Setting Pacing Amplitude: 2.5 V
Lead Channel Setting Pacing Pulse Width: 0.4 ms
MDC IDC MSMT BATTERY IMPEDANCE: 1894 Ohm
MDC IDC MSMT BATTERY VOLTAGE: 2.72 V
MDC IDC MSMT LEADCHNL RA PACING THRESHOLD AMPLITUDE: 0.625 V
MDC IDC MSMT LEADCHNL RV IMPEDANCE VALUE: 734 Ohm
MDC IDC MSMT LEADCHNL RV PACING THRESHOLD AMPLITUDE: 0.75 V
MDC IDC MSMT LEADCHNL RV PACING THRESHOLD PULSEWIDTH: 0.4 ms
MDC IDC SESS DTM: 20150913225211
MDC IDC SET LEADCHNL RV SENSING SENSITIVITY: 2 mV
MDC IDC STAT BRADY AS VS PERCENT: 100 %

## 2014-05-02 ENCOUNTER — Encounter: Payer: Self-pay | Admitting: Cardiology

## 2014-05-06 ENCOUNTER — Encounter: Payer: Self-pay | Admitting: Internal Medicine

## 2014-05-08 ENCOUNTER — Ambulatory Visit (INDEPENDENT_AMBULATORY_CARE_PROVIDER_SITE_OTHER): Payer: Medicare Other | Admitting: Internal Medicine

## 2014-05-08 VITALS — BP 126/78 | HR 80 | Temp 98.3°F | Resp 16 | Ht 67.0 in | Wt 204.2 lb

## 2014-05-08 DIAGNOSIS — K130 Diseases of lips: Secondary | ICD-10-CM

## 2014-05-08 DIAGNOSIS — L568 Other specified acute skin changes due to ultraviolet radiation: Secondary | ICD-10-CM

## 2014-05-08 NOTE — Progress Notes (Signed)
   Subjective:    Patient ID: Tony Cruz, male    DOB: 11/05/1933, 80 y.o.   MRN: [id number]  Rash   complaining of a lesion on his lower lip that is enlarging over the past 2 weeks Occasionally bleeds if rubbed Seems to dry out overnight Started as a small ulcer in the midline of the lower lip No topical products prior He has obstructive sleep apnea and wears a full face mask/ the lower edge sometimes rubs at this area No illness with fever during the past 2 weeks The area burns with certain foods  Review of Systems  Skin: Positive for rash.   Noncontributory    Objective:   Physical Exam  HENT:  Mouth/Throat:     BP 126/78  Pulse 80  Temp(Src) 98.3 F (36.8 C) (Oral)  Resp 16  Ht 5\' 7"  (1.702 m)  Wt 204 lb 3.2 oz (92.625 kg)  BMI 31.97 kg/m2  SpO2 97% No regional adenopathy       Assessment & Plan:  Lip lesion consistent with actinic chelitis or the other things in the differential diagnosis for this lesion which could include early squamous cell carcinoma and lichen planus among other things  HSV culture Wound culture Topical moisturizer for the time being Referral to his dermatologist Dr. Coralie Common for consideration of biopsy as the treatment might involve freezing or 5-FU or other significant topical medication

## 2014-05-10 ENCOUNTER — Telehealth: Payer: Self-pay

## 2014-05-10 NOTE — Telephone Encounter (Signed)
Pt pf Dr.Doolittle needs lab results sent to Dr.Jones, who we referred him too. Fax number [phone]. Dr. Ronnald Ramp expects that pt may have the shingles virus

## 2014-05-10 NOTE — Telephone Encounter (Signed)
Most recent labs are available in [place]. Forwarding message to Dr Ronnald Ramp.

## 2014-05-11 LAB — WOUND CULTURE
Gram Stain: NONE SEEN
Gram Stain: NONE SEEN

## 2014-05-13 LAB — HERPES SIMPLEX VIRUS CULTURE: ORGANISM ID, BACTERIA: NOT DETECTED

## 2014-05-16 ENCOUNTER — Encounter: Payer: Self-pay | Admitting: Internal Medicine

## 2014-05-17 ENCOUNTER — Telehealth: Payer: Self-pay | Admitting: Radiology

## 2014-05-17 NOTE — Telephone Encounter (Signed)
Pt calling about lab results  

## 2014-05-18 NOTE — Telephone Encounter (Signed)
Mailed to him ---both negative

## 2014-05-20 NOTE — Telephone Encounter (Signed)
Pt.notified

## 2014-07-01 ENCOUNTER — Encounter: Payer: Self-pay | Admitting: Pulmonary Disease

## 2014-07-01 ENCOUNTER — Ambulatory Visit (INDEPENDENT_AMBULATORY_CARE_PROVIDER_SITE_OTHER): Payer: Medicare Other | Admitting: Pulmonary Disease

## 2014-07-01 VITALS — BP 120/84 | HR 70 | Temp 98.4°F | Ht 68.0 in | Wt 201.6 lb

## 2014-07-01 DIAGNOSIS — Z950 Presence of cardiac pacemaker: Secondary | ICD-10-CM

## 2014-07-01 DIAGNOSIS — R7301 Impaired fasting glucose: Secondary | ICD-10-CM

## 2014-07-01 DIAGNOSIS — M159 Polyosteoarthritis, unspecified: Secondary | ICD-10-CM

## 2014-07-01 DIAGNOSIS — K5730 Diverticulosis of large intestine without perforation or abscess without bleeding: Secondary | ICD-10-CM

## 2014-07-01 DIAGNOSIS — N138 Other obstructive and reflux uropathy: Secondary | ICD-10-CM

## 2014-07-01 DIAGNOSIS — E663 Overweight: Secondary | ICD-10-CM

## 2014-07-01 DIAGNOSIS — G609 Hereditary and idiopathic neuropathy, unspecified: Secondary | ICD-10-CM

## 2014-07-01 DIAGNOSIS — I10 Essential (primary) hypertension: Secondary | ICD-10-CM

## 2014-07-01 DIAGNOSIS — N401 Enlarged prostate with lower urinary tract symptoms: Secondary | ICD-10-CM

## 2014-07-01 DIAGNOSIS — I495 Sick sinus syndrome: Secondary | ICD-10-CM

## 2014-07-01 DIAGNOSIS — K219 Gastro-esophageal reflux disease without esophagitis: Secondary | ICD-10-CM

## 2014-07-01 DIAGNOSIS — M150 Primary generalized (osteo)arthritis: Secondary | ICD-10-CM

## 2014-07-01 MED ORDER — AMLODIPINE BESYLATE 5 MG PO TABS: ORAL_TABLET | ORAL | Status: DC

## 2014-07-01 MED ORDER — PANTOPRAZOLE SODIUM 40 MG PO TBEC: 40.0000 mg | DELAYED_RELEASE_TABLET | Freq: Every day | ORAL | Status: DC

## 2014-07-01 NOTE — Progress Notes (Signed)
Subjective:    Patient ID: Tony Cruz, male    DOB: 12-18-1933, 80 y.o.   MRN: [id number]  HPI 80 y/o WM here for a follow up visit... he has multiple medical problems as noted below...  ~  SEE PREV EPIC NOTES FOR OLDER DATA >>   ~  January 25, 2013:  35mo ROV & Kipp has mult somatic complaints & "issues w/ aging" including arthritis, LBP, neuropathy> he & wife were exposed to a bat in their house and got a series of rabies vaccinations thru the ER... we reviewed the following medical problems during today's office visit >>     OSA> his eval 7 initial therapy was done by the [place]; followed by DrClance w/ good compliance & no issues w/ sleep or daytime hypersomnolence...     HBP> on ASA81, Hyzaar100-12.5; BP= 126/84 & he denies CP, palpit, SOB, edema...    HxSyncope, sinus node dysfunction, Pacer> followed by DrKlein, pacer function normally, no issues ident...    BorderlineDM> on diet alone; Labs 7/14 showed BS=114, A1c=6.6; we reviewed diet, exercise, wt reduction...     Overweight> weight= 214# and BMI= 32...    GI- GERD, Barrett's, Divertics, IBS> on Prilosec20Bid; known [place], HxBarrett's, prev Rx by DrWeissman & he would like to see DrPyrtle at [facility]...    GU- BPH> on Flomax0.4 & Proscar5; followed by DrRDavis=> DrEskridge    DJD, LBP> on Naprosyn, Tramadol; c/o hands and ankles stiff but he still plays golf; eval by [facility] [employer] & he released him "I've done all I can do for you"    Periph neuropathy>     Skin cancer> he had a squamous cell removed from his left cheek 5/14 by DrDJones... We reviewed prob list, meds, xrays and labs> see below for updates >>   LABS 7/14:  FLP- ok x TG=190 on diet alone;  Chems- ok x BS=114 A1c=6.6;  CBC- wnl;  TSH=1.27;  PSA=0.89;  Uric=5.9.Tony KitchenMarland Cruz  ~  December 28, 2013:  80yr Tony Cruz states he had a "crummy year"> notes that he fell on the ice 2/15 & hit his head, went to the ER w/ skull fx & subdural hematoma;  He has seen DrAquino for [facility],  DrNudelman w/ conservative management but he developed residual HA, dizziness, and BPPV- ultimately sent to DrShoemaker w/ Epley maneuvers & finally improved...  He has also had BP issues w/ pressure in the 150/90 range so we called in Amlodipine 5mg /d- now BP improved to the 130/80 range...  We reviewed the following medical problems during today's office visit >>     OSA> his eval & initial therapy was done by the [place]; followed by DrClance w/ good compliance & no issues w/ sleep or daytime hypersomnolence...     HBP> on ASA81, Cozaar100 & Amlod5; BP= 132/88 & he denies CP, palpit, SOB, edema; DrKlein stopped his Hct due to dizziness......    HxSyncope, sinus node dysfunction, Pacer> followed by DrKlein, pacer function normally, no issues ident...    BorderlineDM> on diet alone; Labs 6/15 showed BS=121, A1c=6.4; we reviewed diet, exercise, wt reduction...     Overweight> weight= 218# and BMI= 33... We reviewed diet, exercise, wt reduction strategies...    GI- GERD, Barrett's, Divertics, IBS> on Prilosec20; known [place], HxBarrett's, prev Rx by DrWeissman & now DrPyrtle at [facility] w/ EGD 11/14 showing gastritis, esophagitis, gastric polyps (path=+Barretts, reflux related injury, neg HPylori); colonoscopy 11/14 showing mod divertics, & 19mm polyp in cecum (tub adenoma).Tony KitchenMarland Cruz  GU- BPH> on Flomax0.4 & Proscar5; followed by DrRDavis=> DrEskridge    DJD, LBP> on Naprosyn, Tramadol; c/o hands and ankles stiff but he still plays golf; eval by Kirby Funk & he released him "I've done all I can do for you"    Fall 2/15 w/ nondisplaced skull fx and subdural hematoma> treated conservatively, he had some resid HA, dizzy, etc & treated w/ Epley maneuvers- improved...    Periph neuropathy> thorough eval 2011 by DrReynolds & Hickling- idiopathic; not on specific therapy for this...    Skin cancer> he had a squamous cell removed from his left cheek 5/14 by DrDJones... We reviewed prob list, meds, xrays and labs> see below  for updates >> we gave him the PREVNAR-13 vaccination today...  CXR 6/15 showed normal heart size, mild tortuous Ao, pacer, clear lungs, NAD...  LABS 6/15:  FLP- wnl on diet;  Chems- wnl x BS=121, A1c=6.4;  CBC- wnl;  TSH=1.73;  VitD=49...   ~  July 01, 2014:  80yo Tony Cruz brought a list of problems to discuss today> He notes decreased smell and taste, exam is neg and we discussed trial of Rx w/ Antihist, Flonase, nasal saline;  He has lost 16# on diet & exercise down to 202# (BMI=30.7), he is encouraged to keep up the good work & get weight down further;  He is c/o some neuropathy discomfort in feet but no real pain, he is encouraged to continue wt reduction, low carb diet, use Tylenol prn;  He notes arthritis in his hands & he has seen DrAnderson in the past who rec aleve prn ("he could help me if I had Rheumatoid arthritis")...     HBP> on ASA81, Cozaar100 & Amlod5; BP= 120/84 & he denies CP, palpit, SOB, edema; DrKlein prev stopped his Hct due to dizziness...Tony KitchenMarland KitchenMarland Cruz    BorderlineDM> on diet alone; weight is down 16# to 202# today; Labs 6/15 showed BS=121, A1c=6.4; we reviewed diet, exercise, wt reduction...     GI- GERD, Barrett's, Divertics, IBS> on Pantoprazole40; known [place], HxBarrett's, prev Rx by DrWeissman & now DrPyrtle at [facility] w/ EGD 11/14 showing gastritis, esophagitis, gastric polyps (path=+Barretts, reflux related injury, neg HPylori); colonoscopy 11/14 showing mod divertics, & 15mm polyp in cecum (tub adenoma)...    GU- BPH> on Flomax0.4 & Proscar5; followed by DrRDavis=> DrEskridge...    DJD, LBP> on Naprosyn, Tylenol; c/o hands and ankles stiff but he still plays golf; eval by Kirby Funk & he released him "I've done all I can do for you"    Fall 2/15 w/ nondisplaced skull fx and subdural hematoma> treated conservatively, he had some resid HA, dizzy, etc & treated w/ Epley maneuvers- improved...    Periph neuropathy> thorough eval 2011 by DrReynolds & Hickling- idiopathic; min  discomfort & not on specific therapy for this... We reviewed prob list, meds, xrays and labs> see below for updates >>            Problem List:   OBSTRUCTIVE SLEEP APNEA (ICD-327.23) - evaluation, diagnosis, and Rx by the [facility] (their notes scanned into the EMR):  he had PSG 4/08 w/ AHI= 26, desat to 83%... placed on CPAP at 11cm H20 pressure... he also has some RLS & was on Requip transiently from the [place]. ~  4/11: pt indicates that he uses his CPAP 4/7 nights for 6-7 hr/night; he feels that he rests well, denies daytime hypersom, snoring, etc... he requests Sleep consult here in [place] (see DrClance consult). ~  5/13: he saw DrClance  for Sleep f/u> noted 10# wt gain, pt reported using CPAP compliantly w/ good daytime alertness, no issues w/ mask but requesting new machine ([employer]) as his is >20yrs old; requested to work on wt reduction... ~  6/15: his eval & initial therapy was done by the [facility]; followed by DrClance w/ good compliance & no issues w/ sleep or daytime hypersomnolence.  ALLERGIC RHINITIS (ICD-477.9) - controlled on Claritin, and OTC meds... ~  12/15: we discussed trial of Antihist, Flonase, nasal saline to help his smell & taste...   HYPERTENSION (ICD-401.9) >>  ~  on ASA 81mg /d & HCTZ 25mg /d...  ~  CXR 4/11 is clear and WNL, NAD... ~  6/12:  BP=122/82 today and he denies HA, fatigue, visual changes, CP, palipit, dizziness, syncope, dyspnea, edema, etc... ~  6/13:  BP= 142/98 & he admits intermittently elev at home; we discussed change med to LOSARTAN/ HCT 100/12.5 daily; he will monitor BP & f/u here... ~  11/13:  BP= 132/88 & similar at home; denies CP, palpit, SOB, edema... ~  6/14:  BP= 126/84 & he remains asymptomatic... ~  6/15:  on ASA81, Cozaar100 & Amlod5; BP= 132/88 & he denies CP, palpit, SOB, edema; DrKlein stopped his Hct due to dizziness. ~  12/15: on ASA81, Cozaar100 & Amlod5; BP= 120/84 & he remains essentially asymptomatic...  Hx of SYNCOPE (ICD-780.2) & CARDIAC  PACEMAKER IN SITU (ICD-V45.01) - he had pacer changed 11/08 DrKlein (hosp DC Summary reviewed)... he continues regular pacer checks and f/u w/ DrKlein...  ~  cath 3/99 DrJoeLeB showed norm coronaries, norm LV, norm Ao... ~  He saw DrKlein 12/11 for f/u syncope secondary to sinus node dysfunction, treated w/ pacer & generator replaced 2008;  He noted intercurrent syncope that was felt to be vasovagal in origin (this occurred in the Cards clinic 8/11 while waiting for his wife, & he has a long hx of this from blood draws over the yrs) & DrKlein reprogramed his device; EKG w/ SBrady & otherw WNL... ~  He saw DrKlein for Cards/EP f/u 12/12> hx syncope related to [facility] dysfunction- devise working properly & no changes made to the programming; TSH & sed rate wetre checked and WNL... ~  Yearly f/u DrKlein 12/13> doing satis w/o intercurrent syncope, pacer working satis & f/u planned 75yr... ~  12/14: he had yearly ROV w/ DrKlein, doing satis & pacer OK, no changes made...  DIABETES MELLITUS, BORDERLINE (ICD-790.29) - he's been concerned about his BS due to [facility] and his neuropathy symptoms, but his BS has only been borderline elvated prev...  ~  labs 4/10 (wt=209#) showed BS= 113, A1c= 6.3.Tony Cruz. rec> better diet, get wt down. ~  labs 4/11 (wt=222#) showed BS= 109, A1c= 6.2.Tony Cruz. no meds yet, just needs to get wt down. ~  Labs 6/12 (wt=204#) showed BS= 102, A1v= 6.4 ~  Labs 6/13 (wt=213#) showed BS= 105, A1c= 6.4 ~  Labs 6/14 showed BS= 114, A1c= 6.6 ~  Labs 6/15 showed BS= 121, A1c= 6.4  OVERWEIGHT (ICD-278.02) - we discussed diet + exercise therapy (again)... ~  weight 4/09 = 226#.Tony KitchenMarland Cruz 5" 9" Tall for a BMI= 33-34 ~  weight 4/10 = 209#... BMI down to 31... keep up the good work. ~  weight 4/11 = 222# .Tony KitchenMarland Cruz BMI up to 33. ~  Weight 6/12 = 204# ~  Weight 6/13 = 213# ~  Weight 6/14 = 214# ~  Weight 6/15 = 218# ~  Weight 12/15 = 202# keep up the good work.Tony KitchenMarland Cruz  GASTROESOPHAGEAL REFLUX DISEASE (ICD-530.81) w/  BARRETT'S ESOPH - followed by DrWeissman & Rx w/ OMEPRAZOLE 20mg Bid... ~  prev EGD's documented a [place] & Barrett's esoph... (last 1/03 by DrPatterson). ~  last EGD was 2/08 showing Barrett's epithelium in lower 1/3 of esoph... f/u planned 73yrs. ~  4/11:  NOTE> f/u EGD is due, he will decide on gastroenterologist ==> he saw DrMJohnson w/ EGD 5/11 showing Irreg Z-line & bx c/w Barrett's esoph (no dysplasia), the rest of the esoph, stomach, & duodenum were WNL... ~  EGD 11/14 by DrPyrtle showed gastritis, esophagitis, gastric polyps (path=+Barretts, reflux related injury, neg HPylori)...  DIVERTICULOSIS OF COLON (ICD-562.10)  IRRITABLE BOWEL SYNDROME (ICD-564.1) ~  last colonoscopy 2/08 by DrWeissman showed left sided divertics, otherw neg... f/u planned 49yrs... ~  Colonoscopy 11/14 by DrPyrtle showed mod divertics, & 55mm polyp in cecum (tub adenoma)...  BENIGN PROSTATIC HYPERTROPHY, WITH OBSTRUCTION (ICD-600.01) - on FLOMAX 0.4mg /d & PROSCAR 5mg /d... eval and Rx by [facility] who checks pt q5mo (we don't have notes from him)... pt tells me he will be seeing DrDavis regularly when he moves to [facility]. ~  9/13: pt reports that he is now seeing DrEskridge at [facility] & his PSAs have all been normal; his major prob has been holding his urine & eval revealed not emptying completely- they have tried several meds to help this; he will have notes sent to [place] for [facility] records.  OSTEOARTHRITIS (ICD-715.90) >> BACK PAIN (ICD-724.5) >>  ~  managed by DrAnderson & difficult problem, thought to have an inflamm arthropathy (?variation of psoriatic arthritis) however he did not respond to NSAIDs/Celebrex, Pred, MTX, anti-TNF inhibitor; on Tramadol prn;  ~  pt saw DrBeane 6/10 for eval> neuropathy symptoms & lumbar spondylosis on XRays, Rx Mobic Prn... offered Myelogram for further eval... he also takes Calcium & Vit D. ~  He was evaluated by Kirby Funk for Rheum => on Naprosyn, Tylenol; c/o hands and ankles stiff  but he still plays golf etc; states that they released him "I've done all I can do for you, if you had RA I could help you"  FALL w/ SUBDURAL HEMATOMA >> treated conservatively, he had some resid HA, dizzy, etc & treated w/ Epley maneuvers- improved CT Head 4/15 showed mild atrophy & sm vessel dis & scat lacunes, prev left parietal extra-axial fluid collection is resolved...   PERIPHERAL NEUROPATHY (ICD-356.9) - DrReynolds evaluated him w/ NCV's etc and dx a peripheral neuropathy... this is one of his CC> numbness, decr sensation in feet, (no pain) w/ some assoc balance problems intermittently...  he tried Mentanx but no benefit...  ~  4/11: I reviewed DrReynolds 11/10 note w/ the patient... ~  He also had a thorough neurologic evaluation 10/11 from one of his church physicians, DrHickling, for f/u polyneuropathy> balance issues, LBP, gait abn, variable paresthesias, foot numbness (sl decr vibration & pin prick), some subjective memory problems w/ norm MMSE, abnormal NCVs;  Prev Labs were all WNL, unrevealing;  etiology of his neuropathy is unknown= cryptogenic, and since it isn't painful he really didn't require any therapy... ~  6/13: he continues to experience prob w/ balance etc related to his neuropathy; not progressive & still not painful; also notes nocturnal leg cramps & he's tried bar of soap trick, mustard, etc- all w/o relief (we rec trial Tonic water)... ~  2015> Neuro follow up by DrAquino for [facility]...  Health Maintenance - he takes a number of Vits + lecithin, saw palmetto, Fish Oil, etc..Tony Cruz  he saw DrDJones in 2010 for Derm review- pt reports nothing major found...   Past Surgical History  Procedure Laterality Date  . Appendectomy    . Cholecystectomy    . Tonsillectomy    . Pacemaker placement    . Skin cancer removed  12/2012    removed from his face--Dr. Sarajane Jews    Outpatient Encounter Prescriptions as of 07/01/2014  Medication Sig  . acetaminophen (TYLENOL) 500 MG tablet  Take 1,000 mg by mouth every 6 (six) hours as needed for pain.  Tony Cruz amLODipine (NORVASC) 5 MG tablet TAKE 1 TABLET (5 MG TOTAL) BY MOUTH DAILY.  Tony Cruz aspirin 81 MG tablet Take 81 mg by mouth daily.  . calcium carbonate (OS-CAL) 600 MG TABS Take 600 mg by mouth daily.    . Cholecalciferol (VITAMIN D) 1000 UNITS capsule Take 1,000 Units by mouth daily.    . finasteride (PROSCAR) 5 MG tablet Take 1 tablet by mouth daily.  . Lecithin 1200 MG CAPS Take 1 capsule by mouth daily.    Tony Cruz loratadine (CLARITIN) 10 MG tablet Take 10 mg by mouth daily.    Tony Cruz losartan (COZAAR) 100 MG tablet Take 1 tablet (100 mg total) by mouth daily.  . Multiple Vitamin (MULTIVITAMIN) capsule Take 1 capsule by mouth daily.    . Omega-3 Fatty Acids (DIALYVITE OMEGA-3 CONCENTRATE) 600 MG CAPS Take 1 capsule by mouth daily.  . pantoprazole (PROTONIX) 40 MG tablet Take 40 mg by mouth daily.  . Tamsulosin HCl (FLOMAX) 0.4 MG CAPS Take 0.4 mg by mouth daily.    . [DISCONTINUED] omeprazole (PRILOSEC) 20 MG capsule Take 20 mg by mouth daily.    Allergies  Allergen Reactions  . Shellfish Allergy     Sensitive to shellfish    Current Medications, Allergies, Past Medical History, Past Surgical History, Family History, and Social History were reviewed in [employer] record.   Review of Systems        The patient complains of sleep disorder, dyspnea on exertion, gas/bloating, joint pain, stiffness, arthritis, paresthesias, and difficulty walking.  The patient denies fever, chills, sweats, anorexia, fatigue, weakness, malaise, weight loss, blurring, diplopia, eye irritation, eye discharge, vision loss, eye pain, photophobia, earache, ear discharge, tinnitus, decreased hearing, nasal congestion, nosebleeds, sore throat, hoarseness, chest pain, palpitations, syncope, orthopnea, PND, peripheral edema, cough, dyspnea at rest, excessive sputum, hemoptysis, wheezing, pleurisy, nausea, vomiting, diarrhea, constipation,  change in bowel habits, abdominal pain, melena, hematochezia, jaundice, indigestion/heartburn, dysphagia, odynophagia, dysuria, hematuria, urinary frequency, urinary hesitancy, nocturia, incontinence, back pain, joint swelling, muscle cramps, muscle weakness, sciatica, restless legs, leg pain at night, leg pain with exertion, rash, itching, dryness, suspicious lesions, paralysis, seizures, tremors, vertigo, transient blindness, frequent falls, frequent headaches, depression, anxiety, memory loss, confusion, cold intolerance, heat intolerance, polydipsia, polyphagia, polyuria, unusual weight change, abnormal bruising, bleeding, enlarged lymph nodes, urticaria, allergic rash, hay fever, and recurrent infections.     Objective:   Physical Exam     WD, WN, 80 y/o WM in NAD... GENERAL:  Alert & oriented; pleasant & cooperative... HEENT:  [place]/AT, EOM-wnl, PERRLA, EACs-clear, TMs-wnl, NOSE-clear, THROAT-clear & wnl. NECK:  Supple w/ fairROM; no JVD; normal carotid impulses w/o bruits; no thyromegaly or nodules palpated; no lymphadenopathy. CHEST:  Clear to P & A; without wheezes/ rales/ or rhonchi. HEART:  Regular Rhythm; without murmurs/ rubs/ or gallops; pacer in left shoulder area... ABDOMEN:  Soft & nontender; normal bowel sounds; no organomegaly or masses detected. EXT: without deformities, mild arthritic changes;  no varicose veins/ +venous insuffic/ no edema. NEURO:  CN's intact;  fair gait;  Motor normal, sensory variable deficits... strength seems OK, min decr sensation in LEs. DERM:  No lesions noted; no rash etc...  RADIOLOGY DATA:  Reviewed in the EPIC EMR & discussed w/ the patient...  LABORATORY DATA:  Reviewed in the EPIC EMR & discussed w/ the patient...   Assessment & Plan:    OSA>  Followed by DrClance & stable on CPAP; we will try to get him a new machine thru [place]...  HBP>  His BP is improved on Cozaar & Amlodipine, continue same...  Hx Syncope/ Pacer>  Followed by DrKlein  & pacer functioning normally...  DM, borderline>  On diet alone & managing well, A1c remains 6.4 & we reviewed the import of wt reduction...  GERD, Hx Barrett's>  S/p EGD from DrPyrtle w/ small segment Barrett's mucosa, no dysplasia...  Divertics, IBS>  Colonoscopy 11/14 w/ 31mm tub adenoma removed...  BPH>  Followed by DrEskridge at [facility]; biggest issue is control & he says it was from not emptying completely, they have tried several meds and continue to follow up regularly, he reports PSAs have been ok; he will get notes to [place]...  DJD>  Followed by Kirby Funk & still having difficulty w/ pain & stiffness...  Periph Neuropathy>  Eval by Neuro, cryptogenic, no meds needed & symptoms remain the same...   Patient's Medications  New Prescriptions   No medications on file  Previous Medications   ACETAMINOPHEN (TYLENOL) 500 MG TABLET    Take 1,000 mg by mouth every 6 (six) hours as needed for pain.   ASPIRIN 81 MG TABLET    Take 81 mg by mouth daily.   CALCIUM CARBONATE (OS-CAL) 600 MG TABS    Take 600 mg by mouth daily.     CHOLECALCIFEROL (VITAMIN D) 1000 UNITS CAPSULE    Take 1,000 Units by mouth daily.     FINASTERIDE (PROSCAR) 5 MG TABLET    Take 1 tablet by mouth daily.   LECITHIN 1200 MG CAPS    Take 1 capsule by mouth daily.     LORATADINE (CLARITIN) 10 MG TABLET    Take 10 mg by mouth daily.     LOSARTAN (COZAAR) 100 MG TABLET    Take 1 tablet (100 mg total) by mouth daily.   MULTIPLE VITAMIN (MULTIVITAMIN) CAPSULE    Take 1 capsule by mouth daily.     OMEGA-3 FATTY ACIDS (DIALYVITE OMEGA-3 CONCENTRATE) 600 MG CAPS    Take 1 capsule by mouth daily.   TAMSULOSIN HCL (FLOMAX) 0.4 MG CAPS    Take 0.4 mg by mouth daily.    Modified Medications   Modified Medication Previous Medication   AMLODIPINE (NORVASC) 5 MG TABLET amLODipine (NORVASC) 5 MG tablet      TAKE 1 TABLET (5 MG TOTAL) BY MOUTH DAILY.    TAKE 1 TABLET (5 MG TOTAL) BY MOUTH DAILY.   PANTOPRAZOLE (PROTONIX) 40  MG TABLET pantoprazole (PROTONIX) 40 MG tablet      Take 1 tablet (40 mg total) by mouth daily.    Take 40 mg by mouth daily.  Discontinued Medications   OMEPRAZOLE (PRILOSEC) 20 MG CAPSULE    Take 20 mg by mouth daily.

## 2014-07-01 NOTE — Patient Instructions (Signed)
Today we updated your med list in our EPIC system...    Continue your current medications the same...  We refilled your meds per request...  Continue your diet 7 exercise program- GREAT JOB!!!  Call for any questions...  Let's plan a follow up visit in 56mo, sooner if needed for problems.Marland KitchenMarland Kitchen

## 2014-07-05 ENCOUNTER — Encounter: Payer: Medicare Other | Admitting: Internal Medicine

## 2014-07-06 ENCOUNTER — Ambulatory Visit (INDEPENDENT_AMBULATORY_CARE_PROVIDER_SITE_OTHER): Payer: Medicare Other | Admitting: Internal Medicine

## 2014-07-06 ENCOUNTER — Encounter: Payer: Self-pay | Admitting: Internal Medicine

## 2014-07-06 VITALS — BP 122/84 | HR 74 | Ht 68.0 in | Wt 202.0 lb

## 2014-07-06 DIAGNOSIS — Z45018 Encounter for adjustment and management of other part of cardiac pacemaker: Secondary | ICD-10-CM

## 2014-07-06 DIAGNOSIS — I495 Sick sinus syndrome: Secondary | ICD-10-CM

## 2014-07-06 LAB — MDC_IDC_ENUM_SESS_TYPE_INCLINIC
Battery Remaining Longevity: 27 mo
Battery Voltage: 2.72 V
Lead Channel Pacing Threshold Amplitude: 0.75 V
Lead Channel Pacing Threshold Amplitude: 0.75 V
Lead Channel Pacing Threshold Pulse Width: 0.4 ms
Lead Channel Setting Pacing Amplitude: 2.5 V
Lead Channel Setting Pacing Pulse Width: 0.4 ms
Lead Channel Setting Sensing Sensitivity: 2 mV
MDC IDC MSMT BATTERY IMPEDANCE: 2019 Ohm
MDC IDC MSMT LEADCHNL RA IMPEDANCE VALUE: 633 Ohm
MDC IDC MSMT LEADCHNL RA PACING THRESHOLD PULSEWIDTH: 0.4 ms
MDC IDC MSMT LEADCHNL RA SENSING INTR AMPL: 1.4 mV
MDC IDC MSMT LEADCHNL RV IMPEDANCE VALUE: 733 Ohm
MDC IDC MSMT LEADCHNL RV SENSING INTR AMPL: 4 mV
MDC IDC SESS DTM: 20151209213307
MDC IDC SET LEADCHNL RA PACING AMPLITUDE: 2 V
MDC IDC STAT BRADY AP VP PERCENT: 0 %
MDC IDC STAT BRADY AP VS PERCENT: 0 %
MDC IDC STAT BRADY AS VP PERCENT: 0 %
MDC IDC STAT BRADY AS VS PERCENT: 100 %

## 2014-07-06 NOTE — Patient Instructions (Addendum)
Your physician recommends that you continue on your current medications as directed. Please refer to the Current Medication list given to you today.  Remote monitoring is used to monitor your pacemaker from home. This monitoring reduces the number of office visits required to check your device to one time per year. It allows [place] to keep an eye on the functioning of your device to ensure it is working properly. You are scheduled for a device check from home on 10-05-2014. You may send your transmission at any time that day. If you have a wireless device, the transmission will be sent automatically. After your physician reviews your transmission, you will receive a postcard with your next transmission date.  Your physician recommends that you schedule a follow-up appointment in: 12 months with Dr.Klein

## 2014-07-06 NOTE — Progress Notes (Signed)
Patient Care Team: [name], MD as PCP - General   HPI  Tony Cruz is a 80 y.o. male is seen in followup for syncope secondary to intermittent sinus node dysfunction for which a pacemaker was previously implanted and changed out 11/08. He has had intercurrent syncope thought to be vasovagal   And he has had recurrent episodes, these were postprandial. They were associated with a recognizable prodrome. His rate drop response on his pacemaker activated.  He has been on finasteride from out one year. This is been associated with improvement in urination as well as a decrease in his PSA   Past Medical History  Diagnosis Date  . Obstructive sleep apnea   . hypertension   . Syncope and collapse   . Pacemaker-MDT     Change out 2008  . Other abnormal glucose   . Overweight(278.02)   . Esophageal reflux   . Diverticulosis of colon (without mention of hemorrhage)   . Irritable bowel syndrome   . Hypertrophy of prostate with urinary obstruction and other lower urinary tract symptoms (LUTS)   . Osteoarthrosis, unspecified whether generalized or localized, unspecified site   . Backache, unspecified   . Unspecified hereditary and idiopathic peripheral neuropathy   . Anal fissure   . Allergy     Past Surgical History  Procedure Laterality Date  . Appendectomy    . Cholecystectomy    . Tonsillectomy    . Pacemaker placement    . Skin cancer removed  12/2012    removed from his face--Dr. Sarajane Jews    Current Outpatient Prescriptions  Medication Sig Dispense Refill  . acetaminophen (TYLENOL) 500 MG tablet Take 1,000 mg by mouth every 6 (six) hours as needed for pain.    Marland Kitchen amLODipine (NORVASC) 5 MG tablet TAKE 1 TABLET (5 MG TOTAL) BY MOUTH DAILY. 90 tablet 3  . aspirin 81 MG tablet Take 81 mg by mouth daily.    . calcium carbonate (OS-CAL) 600 MG TABS Take 600 mg by mouth daily.      . Cholecalciferol (VITAMIN D) 1000 UNITS capsule Take 1,000 Units by mouth daily.       . finasteride (PROSCAR) 5 MG tablet Take 1 tablet by mouth daily.    . Lecithin 1200 MG CAPS Take 1 capsule by mouth daily.      Marland Kitchen loratadine (CLARITIN) 10 MG tablet Take 10 mg by mouth daily.      Marland Kitchen losartan (COZAAR) 100 MG tablet Take 1 tablet (100 mg total) by mouth daily. 90 tablet 2  . Multiple Vitamin (MULTIVITAMIN) capsule Take 1 capsule by mouth daily.      . naproxen (NAPROSYN) 500 MG tablet Take 2,000 mg by mouth daily.     . Omega-3 Fatty Acids (DIALYVITE OMEGA-3 CONCENTRATE) 600 MG CAPS Take 1 capsule by mouth daily.    . pantoprazole (PROTONIX) 40 MG tablet Take 1 tablet (40 mg total) by mouth daily. 90 tablet 3  . Tamsulosin HCl (FLOMAX) 0.4 MG CAPS Take 0.4 mg by mouth daily.      . traMADol (ULTRAM) 50 MG tablet Take 150 mg by mouth daily.      No current facility-administered medications for this visit.    Allergies  Allergen Reactions  . Shellfish Allergy     Sensitive to shellfish    Review of Systems negative except from HPI and PMH  Physical Exam BP 122/84 mmHg  Pulse 74  Ht 5\' 8"  (1.727 m)  Wt 202 lb (91.627 kg)  BMI 30.72 kg/m2 Well developed and well nourished in no acute distress HENT normal E scleral and icterus clear Neck Supple JVP flat; carotids brisk and full Clear to ausculation  Regular rate and rhythm, no murmurs gallops or rub Soft with active bowel sounds No clubbing cyanosis none Edema Alert and oriented, grossly normal motor and sensory function Skin Warm and Dry      A ECG demonstrates sinus rhythm at 63   intervals 16/14/42 Bundle branch block Otherwise normal  Assessment and plan  Syncope presumed neurally mediated  Sinus node dysfunction-prob neurally   Pacemaker The patient's device was interrogated.  The information was reviewed. No changes were made in the programming.     Hypertension   BalanceNeuropathy  Blood pressure is well controlled and lightheadedness implroved with former discontinuation of diuretic.   He has had a few rate drop episodes which may have helped protect against dizziness  The patient has had a few episodes of rate drop pacing there has been no syncope. This is significantly improved  Will ask him to stop his flomax as this may aggravate orthostatic intolerance and hypotension  I have also asked him to followup with his PCP re balance physical therapy

## 2014-07-29 DIAGNOSIS — G4730 Sleep apnea, unspecified: Secondary | ICD-10-CM

## 2014-07-29 HISTORY — DX: Sleep apnea, unspecified: G47.30

## 2014-08-09 ENCOUNTER — Encounter: Payer: Self-pay | Admitting: Neurology

## 2014-08-09 ENCOUNTER — Ambulatory Visit (INDEPENDENT_AMBULATORY_CARE_PROVIDER_SITE_OTHER): Payer: Medicare Other | Admitting: Neurology

## 2014-08-09 VITALS — BP 110/74 | HR 81 | Ht 69.0 in | Wt 204.0 lb

## 2014-08-09 DIAGNOSIS — R430 Anosmia: Secondary | ICD-10-CM | POA: Diagnosis not present

## 2014-08-09 DIAGNOSIS — S065X9A Traumatic subdural hemorrhage with loss of consciousness of unspecified duration, initial encounter: Secondary | ICD-10-CM

## 2014-08-09 DIAGNOSIS — G609 Hereditary and idiopathic neuropathy, unspecified: Secondary | ICD-10-CM | POA: Diagnosis not present

## 2014-08-09 DIAGNOSIS — I6200 Nontraumatic subdural hemorrhage, unspecified: Secondary | ICD-10-CM

## 2014-08-09 DIAGNOSIS — S065XAA Traumatic subdural hemorrhage with loss of consciousness status unknown, initial encounter: Secondary | ICD-10-CM

## 2014-08-09 NOTE — Patient Instructions (Signed)
1. Refer to PT for Balance therapy 2. Discuss loss of smell and taste with your ENT specialist as well 3. Follow-up in 4 months

## 2014-08-09 NOTE — Progress Notes (Signed)
NEUROLOGY FOLLOW UP OFFICE NOTE  Tony Cruz [id number]  HISTORY OF PRESENT ILLNESS: I had the pleasure of seeing Tony Cruz in follow-up in the neurology clinic on 08/09/2014.  The patient was last seen 9 months ago after a fall where he sustained a small subdural bleed and had post-concussive headaches. Since his last visit, he reports that the headaches have pretty much resolved. He has some sinus congestion but states there is not a lot of discharge. He feels there is some swelling in his nasal passages. His main concerns today are his loss of sense of taste and smell over the past 6 months. Things don't taste the same, apples taste sour. He uses his CPAP regularly. He denies any sore throat or difficulty swallowing. His other concern today is worsening neuropathy. He has frequent numbness in both legs, no pain. He denies any falls, no back pain or bowel/bladder dysfunction. He has not had any episodes of syncope in more than a year since BP medications and Flomax were adjusted.   Per records, he had a neuropathy evaluation with Dr. Doy Mince and Dr. Gaynell Face with a diagnosis of idiopathic neuropathy.  PAST MEDICAL HISTORY: Past Medical History  Diagnosis Date  . Obstructive sleep apnea   . hypertension   . Syncope and collapse   . Pacemaker-MDT     Change out 2008  . Other abnormal glucose   . Overweight(278.02)   . Esophageal reflux   . Diverticulosis of colon (without mention of hemorrhage)   . Irritable bowel syndrome   . Hypertrophy of prostate with urinary obstruction and other lower urinary tract symptoms (LUTS)   . Osteoarthrosis, unspecified whether generalized or localized, unspecified site   . Backache, unspecified   . Unspecified hereditary and idiopathic peripheral neuropathy   . Anal fissure   . Allergy     MEDICATIONS: Current Outpatient Prescriptions on File Prior to Visit  Medication Sig Dispense Refill  . acetaminophen (TYLENOL) 500 MG tablet Take  1,000 mg by mouth every 6 (six) hours as needed for pain.    Marland Kitchen amLODipine (NORVASC) 5 MG tablet TAKE 1 TABLET (5 MG TOTAL) BY MOUTH DAILY. 90 tablet 3  . aspirin 81 MG tablet Take 81 mg by mouth daily.    . calcium carbonate (OS-CAL) 600 MG TABS Take 600 mg by mouth daily.      . Cholecalciferol (VITAMIN D) 1000 UNITS capsule Take 1,000 Units by mouth daily.      . finasteride (PROSCAR) 5 MG tablet Take 1 tablet by mouth daily.    . Lecithin 1200 MG CAPS Take 1 capsule by mouth daily.      Marland Kitchen loratadine (CLARITIN) 10 MG tablet Take 10 mg by mouth daily.      Marland Kitchen losartan (COZAAR) 100 MG tablet Take 1 tablet (100 mg total) by mouth daily. 90 tablet 2  . Multiple Vitamin (MULTIVITAMIN) capsule Take 1 capsule by mouth daily.      . naproxen (NAPROSYN) 500 MG tablet Take 500 mg by mouth 2 (two) times daily.     . Omega-3 Fatty Acids (DIALYVITE OMEGA-3 CONCENTRATE) 600 MG CAPS Take 1 capsule by mouth daily.    . pantoprazole (PROTONIX) 40 MG tablet Take 1 tablet (40 mg total) by mouth daily. 90 tablet 3  . Tamsulosin HCl (FLOMAX) 0.4 MG CAPS Take 0.4 mg by mouth daily.      . traMADol (ULTRAM) 50 MG tablet Take 50 mg by mouth 3 (three) times daily.  No current facility-administered medications on file prior to visit.    ALLERGIES: Allergies  Allergen Reactions  . Shellfish Allergy     Sensitive to shellfish    FAMILY HISTORY: Family History  Problem Relation Age of Onset  . Coronary artery disease Father     bladder cancer  . Coronary artery disease Mother   . Diabetes Sister     2 sisters    SOCIAL HISTORY: History   Social History  . Marital Status: Married    Spouse Name: phyllis Diers    Number of Children: N/A  . Years of Education: N/A   Occupational History  . Retired   .     Social History Main Topics  . Smoking status: Never Smoker   . Smokeless tobacco: Never Used  . Alcohol Use: 0.0 oz/week    0 Not specified per week  . Drug Use: No  . Sexual Activity:  No   Other Topics Concern  . Not on file   Social History Narrative    REVIEW OF SYSTEMS: Constitutional: No fevers, chills, or sweats, no generalized fatigue, change in appetite Eyes: No visual changes, double vision, eye pain Ear, nose and throat: No hearing loss, ear pain, nasal congestion, sore throat Cardiovascular: No chest pain, palpitations Respiratory:  No shortness of breath at rest or with exertion, wheezes GastrointestinaI: No nausea, vomiting, diarrhea, abdominal pain, fecal incontinence Genitourinary:  No dysuria, urinary retention or frequency Musculoskeletal:  No neck pain, back pain Integumentary: No rash, pruritus, skin lesions Neurological: as above Psychiatric: No depression, insomnia, anxiety Endocrine: No palpitations, fatigue, diaphoresis, mood swings, change in appetite, change in weight, increased thirst Hematologic/Lymphatic:  No anemia, purpura, petechiae. Allergic/Immunologic: no itchy/runny eyes, nasal congestion, recent allergic reactions, rashes  PHYSICAL EXAM: Filed Vitals:   08/09/14 0816  BP: 110/74  Pulse: 81   General: No acute distress Head:  Normocephalic/atraumatic Neck: supple, no paraspinal tenderness, full range of motion Heart:  Regular rate and rhythm Lungs:  Clear to auscultation bilaterally Back: No paraspinal tenderness Skin/Extremities: No rash, no edema Neurological Exam: alert and oriented to person, place, and time. No aphasia or dysarthria. Fund of knowledge is appropriate.  Recent and remote memory are intact.  Attention and concentration are normal.    Able to name objects and repeat phrases. Cranial nerves: Pupils equal, round, reactive to light.  Fundoscopic exam unremarkable, no papilledema. Extraocular movements intact with no nystagmus. Visual fields full. Facial sensation intact. No facial asymmetry. Tongue, uvula, palate midline.  Motor: Bulk and tone normal, muscle strength 5/5 throughout with no pronator drift.   Sensation decreased to pin, cold and vibration in stocking distribution up to ankles bilaterally. Intact to all modalities on both UE. Romberg test positive. Deep tendon reflexes +2 throughout except for absent ankle jerks bilaterally. Plantars downgoing. Finger to nose testing intact.  Gait narrow-based and steady, able to tandem walk with mild difficulty.  IMPRESSION: This is a pleasant 81 yo RH man with a history of hypertension, syncope due to sinus node dysfunction s/p pacemaker placement, vasovagal syncope, peripheral neuropathy, who slipped on ice on 09/18/13 and sustained a small subdural hematoma in the anterior falx and left frontal region. He continues to follow-up with neurosurgery and has done very well with minimal headaches. His main concern today is loss of sense and taste and smell, as well as worsening neuropathy. Although this can commonly occur after the age of 22, similar symptoms can be seen after head injury, as well as  with nasal/allergy issues. He will speak to his ENT regarding this as well. We discussed neuropathy, and option for membrane stabilizing agents such as gabapentin. He would like to hold off for now. He reports worsening balance and will be referred for balance therapy. He will follow-up in 4 months.  Thank you for allowing me to participate in his care.  Please do not hesitate to call for any questions or concerns.  The duration of this appointment visit was 25 minutes of face-to-face time with the patient.  Greater than 50% of this time was spent in counseling, explanation of diagnosis, planning of further management, and coordination of care.   Ellouise Newer, M.D.   CC: Dr. Lenna Gilford

## 2014-08-12 ENCOUNTER — Other Ambulatory Visit: Payer: Self-pay | Admitting: Pulmonary Disease

## 2014-08-13 ENCOUNTER — Encounter: Payer: Self-pay | Admitting: Neurology

## 2014-08-24 ENCOUNTER — Encounter: Payer: Self-pay | Admitting: Physical Therapy

## 2014-08-24 ENCOUNTER — Ambulatory Visit: Payer: Medicare Other | Attending: Neurology | Admitting: Physical Therapy

## 2014-08-24 DIAGNOSIS — R269 Unspecified abnormalities of gait and mobility: Secondary | ICD-10-CM | POA: Insufficient documentation

## 2014-08-24 NOTE — Therapy (Signed)
[facility] [address] [place], [place], [address] Phone: [phone]   Fax:  [phone]  Physical Therapy Treatment  Patient Details  Name: Tony Cruz MRN: [id number] Date of Birth: 1934-04-04 Referring Provider:  [name], MD  Encounter Date: 08/24/2014      PT End of Session - 08/24/14 1325    Visit Number 1   Number of Visits 9   Date for PT Re-Evaluation 10/23/13   Authorization Type Medicare G-code every 10th visit   PT Start Time 0934   PT Stop Time 1015   PT Time Calculation (min) 41 min      Past Medical History  Diagnosis Date  . Obstructive sleep apnea   . hypertension   . Syncope and collapse   . Pacemaker-MDT     Change out 2008  . Other abnormal glucose   . Overweight(278.02)   . Esophageal reflux   . Diverticulosis of colon (without mention of hemorrhage)   . Irritable bowel syndrome   . Hypertrophy of prostate with urinary obstruction and other lower urinary tract symptoms (LUTS)   . Osteoarthrosis, unspecified whether generalized or localized, unspecified site   . Backache, unspecified   . Unspecified hereditary and idiopathic peripheral neuropathy   . Anal fissure   . Allergy     Past Surgical History  Procedure Laterality Date  . Appendectomy    . Cholecystectomy    . Tonsillectomy    . Pacemaker placement    . Skin cancer removed  12/2012    removed from his face--Dr. Sarajane Jews    There were no vitals taken for this visit.  Visit Diagnosis:  Abnormality of gait      Subjective Assessment - 08/24/14 0940    Symptoms Pt is a 81 year old male who reports progression of peripheral neuropathy over the past >10 years.  He notes balance difficulty because of this.  He reports no falls in the past 6 months; one fall about 1 year ago on ice.  He reports being more careful with gait.  He does not use assistive device.  He enjoys golfing.   Pertinent History Peripheral  neuropathy >10 years ago   Patient Stated Goals Wants to be shown daily exercises that can be done to help offset balance changes due to peripheral neuropathy.   Currently in Pain? No/denies          [facility] PT Assessment - 08/24/14 0001    Assessment   Medical Diagnosis idiopathic peripheral neuropathy   Onset Date --  >10 years   Precautions   Precautions Fall   Balance Screen   Has the patient fallen in the past 6 months No   Has the patient had a decrease in activity level because of a fear of falling?  No   Is the patient reluctant to leave their home because of a fear of falling?  No   Home Environment   Living Enviornment Private residence   Living Arrangements Spouse/significant other   Available Help at Discharge Family   Type of [place] to enter   Entrance Stairs-Number of Steps 2   Entrance Stairs-Rails Can reach both   [place] Two level   Alternate Level Stairs-Number of Steps --  flight; goes up to attic using rail   Prior Function   Level of Independence Independent with basic ADLs;Independent with gait;Independent with transfers   Leisure Enjoys golfing-tries to play 1-2  times per week   Observation/Other Assessments   Focus on Therapeutic Outcomes (FOTO)  Functional Status intake score 86; ABC score 57.5%   Strength   Right Hip Flexion 5/5   Left Hip Flexion 5/5   Right Knee Flexion 4+/5   Right Knee Extension 5/5   Left Knee Flexion 4+/5   Left Knee Extension 5/5   Right Ankle Dorsiflexion 4/5   Right Ankle Plantar Flexion 4/5   Left Ankle Dorsiflexion 4/5   Left Ankle Plantar Flexion 4/5   Ambulation/Gait   Ambulation/Gait Yes   Ambulation/Gait Assistance 7: Independent   Ambulation Distance (Feet) 200 Feet   Assistive device None   Gait Pattern Wide base of support  With eyes closed, pt veers to the right   Ambulation Surface Level;Indoor   Gait velocity 7.60 seconds   Stairs Yes   Stairs Assistance 7: Independent    Stair Management Technique One rail Right   Number of Stairs 4   Balance   Balance Assessed Yes  Single limb stance >3 seconds eyes on ground, arms out   Standardized Balance Assessment   Standardized Balance Assessment Timed Up and Go Test   Timed Up and Go Test   TUG Normal TUG   Normal TUG (seconds) 9.5   High Level Balance   High Level Balance Comments Eyes open and closed 30 seconds on solid surface and then on foam.  On foam with eyes closed, pt has increased sway.   Functional Gait  Assessment   Gait assessed  Yes   Gait Level Surface Walks 20 ft in less than 5.5 sec, no assistive devices, good speed, no evidence for imbalance, normal gait pattern, deviates no more than 6 in outside of the 12 in walkway width.   Change in Gait Speed Able to smoothly change walking speed without loss of balance or gait deviation. Deviate no more than 6 in outside of the 12 in walkway width.   Gait with Horizontal Head Turns Performs head turns smoothly with slight change in gait velocity (eg, minor disruption to smooth gait path), deviates 6-10 in outside 12 in walkway width, or uses an assistive device.   Gait with Vertical Head Turns Performs task with slight change in gait velocity (eg, minor disruption to smooth gait path), deviates 6 - 10 in outside 12 in walkway width or uses assistive device   Gait and Pivot Turn Pivot turns safely in greater than 3 sec and stops with no loss of balance, or pivot turns safely within 3 sec and stops with mild imbalance, requires small steps to catch balance.   Step Over Obstacle Is able to step over one shoe box (4.5 in total height) without changing gait speed. No evidence of imbalance.   Gait with Narrow Base of Support Is able to ambulate for 10 steps heel to toe with no staggering.   Gait with Eyes Closed Walks 20 ft, uses assistive device, slower speed, mild gait deviations, deviates 6-10 in outside 12 in walkway width. Ambulates 20 ft in less than 9 sec but  greater than 7 sec.   Steps Alternating feet, must use rail.                          PT Education - 08/24/14 1322    Education provided Yes   Education Details Initial education regarding importance of walking program for exercise, initiating corner balance exercises on solid surface with head turns and head  nods (as pt wants to start exercise today)   Person(s) Educated Patient   Methods Explanation   Comprehension Verbalized understanding             PT Long Term Goals - 09/01/2014 1336    PT LONG TERM GOAL #1   Title Pt will be independent with HEP for improved balance and gait.   Time 4   Period Weeks   Status New   PT LONG TERM GOAL #2   Title Functional Gait assessment to be completed, with pt scoring at least 22/30 for decreased fall risk.   PT LONG TERM GOAL #3   Title Pt will report improvement in ABC scale score by 10% to demonstrate improved balance confidence.   Time 4   Period Weeks   Status New   PT LONG TERM GOAL #4   Title Pt will be able to ambulate at least 1000 ft indoor and outdoor surfaces, independently without loss of balance.   Time 4   Period Weeks   Status New               Plan - September 01, 2014 1328    Clinical Impression Statement Pt is a 81 year old male who presents with idiopathic peripheral neuropathy with changes in his balance.  He feels he is having difficulty with stairs and ladders, eyes closed in the shower, and cramping of his legs.  He does report having a history of vertigo in the past year.  Pt would benefit from Sensory Organization test to further assess balance systems.   Pt will benefit from skilled therapeutic intervention in order to improve on the following deficits Abnormal gait;Decreased balance   Rehab Potential Good   PT Frequency 2x / week  1x/wk for 1 week, then   PT Duration 4 weeks  plus evaluation   PT Treatment/Interventions ADLs/Self Care Home Management;Gait training;Functional mobility  training;Stair training;Neuromuscular re-education;Balance training;Therapeutic exercise;Therapeutic activities;Patient/family education   PT Next Visit Plan Sensory Organization test, Initiate corner balance HEP for vestibular system; assess vertigo if pt reports symptoms   Consulted and Agree with Plan of Care Patient          G-Codes - September 01, 2014 1338    Functional Assessment Tool Used No current balance HEP   Functional Limitation Self care   Self Care Current Status (W4665) At least 20 percent but less than 40 percent impaired, limited or restricted   Self Care Goal Status (L9357) 0 percent impaired, limited or restricted      Problem List Patient Active Problem List   Diagnosis Date Noted  . Subdural hematoma 09/30/2013  . Headache(784.0) 09/30/2013  . Sinoatrial node dysfunction 06/01/2013  . Exposure to bat without known bite 09/28/2012  . Pacemaker-MDT 07/02/2011  . Night sweats 07/02/2011  . DIZZINESS 03/22/2010  . Impaired fasting glucose 11/02/2009  . OBSTRUCTIVE SLEEP APNEA 11/21/2008  . BACK PAIN 11/03/2008  . Overweight 11/02/2007  . Essential hypertension 11/02/2007  . ALLERGIC RHINITIS 11/02/2007  . GASTROESOPHAGEAL REFLUX DISEASE 11/02/2007  . Diverticulosis of large intestine 11/02/2007  . BPH (benign prostatic hypertrophy) with urinary obstruction 11/02/2007  . SYNCOPE 11/02/2007  . Hereditary and idiopathic peripheral neuropathy 10/30/2007  . IRRITABLE BOWEL SYNDROME 10/30/2007  . Osteoarthritis 10/30/2007    MARRIOTT,AMY W. 2014/09/01, 1:40 PM  Mady Haagensen, PT 09-01-14 1:41 PM Phone: [phone] Fax: [facility] [facility] [address] [place], [place], [address] Phone: [phone]   Fax:  [phone]

## 2014-08-25 ENCOUNTER — Ambulatory Visit: Payer: Medicare Other | Admitting: Physical Therapy

## 2014-08-25 ENCOUNTER — Encounter: Payer: Self-pay | Admitting: Physical Therapy

## 2014-08-25 DIAGNOSIS — R269 Unspecified abnormalities of gait and mobility: Secondary | ICD-10-CM

## 2014-08-25 NOTE — Therapy (Signed)
[facility] [address] [place], [place], [address] Phone: [phone]   Fax:  [phone]  Physical Therapy Treatment  Patient Details  Name: Tony Cruz MRN: [id number] Date of Birth: August 03, 1933 Referring Provider:  [name], MD  Encounter Date: 08/25/2014      PT End of Session - 08/25/14 1125    Visit Number 2  G2   Number of Visits 9   Date for PT Re-Evaluation 10/23/13   Authorization Type Medicare G-code every 10th visit   PT Start Time 0803   PT Stop Time 0850   PT Time Calculation (min) 47 min      Past Medical History  Diagnosis Date  . Obstructive sleep apnea   . hypertension   . Syncope and collapse   . Pacemaker-MDT     Change out 2008  . Other abnormal glucose   . Overweight(278.02)   . Esophageal reflux   . Diverticulosis of colon (without mention of hemorrhage)   . Irritable bowel syndrome   . Hypertrophy of prostate with urinary obstruction and other lower urinary tract symptoms (LUTS)   . Osteoarthrosis, unspecified whether generalized or localized, unspecified site   . Backache, unspecified   . Unspecified hereditary and idiopathic peripheral neuropathy   . Anal fissure   . Allergy     Past Surgical History  Procedure Laterality Date  . Appendectomy    . Cholecystectomy    . Tonsillectomy    . Pacemaker placement    . Skin cancer removed  12/2012    removed from his face--Dr. Sarajane Jews    There were no vitals taken for this visit.  Visit Diagnosis:  Abnormality of gait      Subjective Assessment - 08/25/14 1119    Symptoms Pt. reports unsteadiness with turns, especially quick turns; reports some motion sickness at times - states he became dizzy when taking lights off Christmas tree and made 3 turns around tree in doing so   Pertinent History Peripheral neuropathy >10 years ago   Patient Stated Goals Wants to be shown daily exercises that can be done to help offset  balance changes due to peripheral neuropathy.   Currently in Pain? No/denies                    [facility] Adult PT Treatment/Exercise - 08/25/14 1121    Standardized Balance Assessment   Standardized Balance Assessment Balance Master Testing   Ankle Exercises: Stretches   Gastroc Stretch 2 reps;30 seconds   Ankle Exercises: Standing   Heel Raises 10 reps  each leg separately   Other Standing Ankle Exercises ambulating on tip toes and on heels with knees flexed inside bars 10' x 2 reps each - forward, then backward    Neuro Re-ed: Sensory Organization Test; composite score 78/100 (WNL's) with normal= 65/100; all inputs including visual, somatosensory,  And vestibular are WNL's; pt. Does not use ankle strategy in maintaining balance per SOT; Explained results of SOT  To pt. - he verbalized understanding  Pt. Instructed in balance on foam with EO and EC - with targets when EO and head turns horizontal and vertical When EC - with feet apart and together         PT Education - 08/25/14 1125    Education provided Yes   Education Details balance on foam; heel raise for strengthening - single limb stance- amb. on toes and heels   Person(s) Educated Patient   Methods  Explanation;Demonstration;Handout   Comprehension Verbalized understanding;Returned demonstration             PT Long Term Goals - 09/20/2014 1336    PT LONG TERM GOAL #1   Title Pt will be independent with HEP for improved balance and gait.   Time 4   Period Weeks   Status New   PT LONG TERM GOAL #2   Title Functional Gait assessment to be completed, with pt scoring at least 22/30 for decreased fall risk.   PT LONG TERM GOAL #3   Title Pt will report improvement in ABC scale score by 10% to demonstrate improved balance confidence.   Time 4   Period Weeks   Status New   PT LONG TERM GOAL #4   Title Pt will be able to ambulate at least 1000 ft indoor and outdoor surfaces, independently without loss of  balance.   Time 4   Period Weeks   Status New               Plan - 08/25/14 1126    Clinical Impression Statement Pt.'s composite score on SOT is WNL's for all inputs; pt. does demo some sway/unsteadiness with standing on grey foam for instruction in HEP   Pt will benefit from skilled therapeutic intervention in order to improve on the following deficits Abnormal gait;Decreased balance   Rehab Potential Good   PT Frequency 2x / week   PT Duration 4 weeks   PT Next Visit Plan check HEP - give pic of heel cord stretch; cont high level balance exercises   Consulted and Agree with Plan of Care Patient          G-Codes - 2014-09-20 1338    Functional Assessment Tool Used No current balance HEP   Functional Limitation Self care   Self Care Current Status (N2778) At least 20 percent but less than 40 percent impaired, limited or restricted   Self Care Goal Status (E4235) 0 percent impaired, limited or restricted      Problem List Patient Active Problem List   Diagnosis Date Noted  . Subdural hematoma 09/30/2013  . Headache(784.0) 09/30/2013  . Sinoatrial node dysfunction 06/01/2013  . Exposure to bat without known bite 09/28/2012  . Pacemaker-MDT 07/02/2011  . Night sweats 07/02/2011  . DIZZINESS 03/22/2010  . Impaired fasting glucose 11/02/2009  . OBSTRUCTIVE SLEEP APNEA 11/21/2008  . BACK PAIN 11/03/2008  . Overweight 11/02/2007  . Essential hypertension 11/02/2007  . ALLERGIC RHINITIS 11/02/2007  . GASTROESOPHAGEAL REFLUX DISEASE 11/02/2007  . Diverticulosis of large intestine 11/02/2007  . BPH (benign prostatic hypertrophy) with urinary obstruction 11/02/2007  . SYNCOPE 11/02/2007  . Hereditary and idiopathic peripheral neuropathy 10/30/2007  . IRRITABLE BOWEL SYNDROME 10/30/2007  . Osteoarthritis 10/30/2007    Alda Lea, PT 08/25/2014, 11:29 AM  [facility] [address] [place],  [place], [address] Phone: [phone]   Fax:  [phone]

## 2014-08-25 NOTE — Patient Instructions (Addendum)
Gastroc / Heel Cord Stretch - On Step   Stand with heels over edge of stair. Holding rail, lower heels until stretch is felt in calf of legs. Repeat ___ times. Do ___ times per day.  Copyright  VHI. All rights reserved.  Pt. Also  Given amb. On toes and heels; balance on foam exercises with EO and EC; and single limb plantarflexion in standing 10-15 reps each

## 2014-08-30 ENCOUNTER — Ambulatory Visit: Payer: Medicare Other | Attending: Neurology | Admitting: Physical Therapy

## 2014-08-30 DIAGNOSIS — R269 Unspecified abnormalities of gait and mobility: Secondary | ICD-10-CM | POA: Insufficient documentation

## 2014-08-31 ENCOUNTER — Encounter: Payer: Self-pay | Admitting: Physical Therapy

## 2014-08-31 NOTE — Therapy (Signed)
[facility] [address] [place], [place], [address] Phone: [phone]   Fax:  [phone]  Physical Therapy Treatment  Patient Details  Name: Tony Cruz MRN: [id number] Date of Birth: 10-15-1933 Referring Provider:  [name], MD  Encounter Date: 08/30/2014      PT End of Session - 08/31/14 0938    Visit Number 3  G3   Number of Visits 9   Date for PT Re-Evaluation 10/23/13   PT Start Time 1315   PT Stop Time 1402   PT Time Calculation (min) 47 min      Past Medical History  Diagnosis Date  . Obstructive sleep apnea   . hypertension   . Syncope and collapse   . Pacemaker-MDT     Change out 2008  . Other abnormal glucose   . Overweight(278.02)   . Esophageal reflux   . Diverticulosis of colon (without mention of hemorrhage)   . Irritable bowel syndrome   . Hypertrophy of prostate with urinary obstruction and other lower urinary tract symptoms (LUTS)   . Osteoarthrosis, unspecified whether generalized or localized, unspecified site   . Backache, unspecified   . Unspecified hereditary and idiopathic peripheral neuropathy   . Anal fissure   . Allergy     Past Surgical History  Procedure Laterality Date  . Appendectomy    . Cholecystectomy    . Tonsillectomy    . Pacemaker placement    . Skin cancer removed  12/2012    removed from his face--Dr. Sarajane Jews    There were no vitals taken for this visit.  Visit Diagnosis:  Abnormality of gait      Subjective Assessment - 08/31/14 0933    Symptoms Pt. reports doing exercises given for HEP at home; continues to c/o unsteadiness with quick turns at times   Currently in Pain? No/denies                    [facility] Adult PT Treatment/Exercise - 08/31/14 0001    High Level Balance   High Level Balance Activities Braiding  front crossover step on blue mat with CGA   Ankle Exercises: Standing   Heel Raises 10 reps  each leg separately    Other Standing Ankle Exercises ambulating on tip toes and on heels with knees flexed inside bars 10' x 2 reps each - forward, then backward   Ankle Exercises: Aerobic   Elliptical 1" forward and 1" backward at level 1.5     Neuro Re-ed: standing on compliant surfaces with head turns horizontal and vertical and with EC with CGA: Standing on BOSU - squats bil. LE's x 10 reps; with single limb RLE x 10 reps and LLE x 10 reps; standing with Minimal to no UE support with head turns;  Cone taps on mat with CGA for single limb stance:  Standing with feet Together and in tandem with head turns and with EC with CGA to min assist for incr. Vestibular input Seated on blue swiss ball - lifting opposite UE and LE x 3 reps each;  Seated marching x 10 reps each leg for core Stabilization; alternate tap up to 2nd step while standing on blue foam with CGA to SBA  TherEx;  Standing bil. Hip extension, flexion and abduction with blue band x 10 reps each - given these to add to HEP Leg press bil. LE's 90# 3 sets 10 reps  PT Long Term Goals - 08/24/14 1336    PT LONG TERM GOAL #1   Title Pt will be independent with HEP for improved balance and gait.   Time 4   Period Weeks   Status New   PT LONG TERM GOAL #2   Title Functional Gait assessment to be completed, with pt scoring at least 22/30 for decreased fall risk.   PT LONG TERM GOAL #3   Title Pt will report improvement in ABC scale score by 10% to demonstrate improved balance confidence.   Time 4   Period Weeks   Status New   PT LONG TERM GOAL #4   Title Pt will be able to ambulate at least 1000 ft indoor and outdoor surfaces, independently without loss of balance.   Time 4   Period Weeks   Status New               Plan - 08/31/14 2706    Clinical Impression Statement Pt. doing very well with activities in clinic - would benefit from joining gym for cardiovascular conditioning; legs were noted to have tremors  during balance activity on BOSU   Pt will benefit from skilled therapeutic intervention in order to improve on the following deficits Abnormal gait;Decreased balance   Rehab Potential Good   PT Frequency 2x / week   PT Duration 4 weeks   PT Treatment/Interventions ADLs/Self Care Home Management;Gait training;Functional mobility training;Stair training;Neuromuscular re-education;Balance training;Therapeutic exercise;Therapeutic activities;Patient/family education   PT Next Visit Plan cont high level balance and strength; ask about theraband HEP; need stockinette to secure in door?   PT Home Exercise Plan as instructed   Consulted and Agree with Plan of Care Patient        Problem List Patient Active Problem List   Diagnosis Date Noted  . Subdural hematoma 09/30/2013  . Headache(784.0) 09/30/2013  . Sinoatrial node dysfunction 06/01/2013  . Exposure to bat without known bite 09/28/2012  . Pacemaker-MDT 07/02/2011  . Night sweats 07/02/2011  . DIZZINESS 03/22/2010  . Impaired fasting glucose 11/02/2009  . OBSTRUCTIVE SLEEP APNEA 11/21/2008  . BACK PAIN 11/03/2008  . Overweight 11/02/2007  . Essential hypertension 11/02/2007  . ALLERGIC RHINITIS 11/02/2007  . GASTROESOPHAGEAL REFLUX DISEASE 11/02/2007  . Diverticulosis of large intestine 11/02/2007  . BPH (benign prostatic hypertrophy) with urinary obstruction 11/02/2007  . SYNCOPE 11/02/2007  . Hereditary and idiopathic peripheral neuropathy 10/30/2007  . IRRITABLE BOWEL SYNDROME 10/30/2007  . Osteoarthritis 10/30/2007    Alda Lea, PT 08/31/2014, 9:43 AM  [facility] [address] [place], [place], [address] Phone: [phone]   Fax:  [phone]

## 2014-09-02 ENCOUNTER — Ambulatory Visit: Payer: Medicare Other | Admitting: Physical Therapy

## 2014-09-02 DIAGNOSIS — R269 Unspecified abnormalities of gait and mobility: Secondary | ICD-10-CM | POA: Diagnosis not present

## 2014-09-02 NOTE — Therapy (Signed)
[facility] [address] [place], [place], [address] Phone: [phone]   Fax:  [phone]  Physical Therapy Treatment  Patient Details  Name: Tony Cruz MRN: [id number] Date of Birth: 1933-11-25 Referring Provider:  [name], MD  Encounter Date: 09/02/2014      PT End of Session - 09/02/14 1230    Visit Number 4   Number of Visits 9   Date for PT Re-Evaluation 10/23/13   Authorization Type Medicare G-code every 10th visit   PT Start Time 1020   PT Stop Time 1100   PT Time Calculation (min) 40 min   Equipment Utilized During Treatment Gait belt   Activity Tolerance Patient tolerated treatment well      Past Medical History  Diagnosis Date  . Obstructive sleep apnea   . hypertension   . Syncope and collapse   . Pacemaker-MDT     Change out 2008  . Other abnormal glucose   . Overweight(278.02)   . Esophageal reflux   . Diverticulosis of colon (without mention of hemorrhage)   . Irritable bowel syndrome   . Hypertrophy of prostate with urinary obstruction and other lower urinary tract symptoms (LUTS)   . Osteoarthrosis, unspecified whether generalized or localized, unspecified site   . Backache, unspecified   . Unspecified hereditary and idiopathic peripheral neuropathy   . Anal fissure   . Allergy     Past Surgical History  Procedure Laterality Date  . Appendectomy    . Cholecystectomy    . Tonsillectomy    . Pacemaker placement    . Skin cancer removed  12/2012    removed from his face--Dr. Sarajane Jews    There were no vitals taken for this visit.  Visit Diagnosis:  Abnormality of gait      Subjective Assessment - 09/02/14 1020    Symptoms Worked pretty hard in therapy the other day.   Currently in Pain? No/denies      TherEx: Reviewed/performed standing hip abduction, hip adduction, hip extension, and hip flexion x 10 reps each leg, at doorframe, using stockinette knotted and  placed in doorway as option for performing at home.  Cues provided for upright posture and for control of lower extremities during exercise.  Forward step ups onto 6 inch step x 10 reps each leg with intermittent UE support.  Leg press 90#, 3 sets of 10 reps, with cues for slowed, controlled performance of exercise.    Neuro Reeducation: On foam surface in parallel bars:  Feet apart, feet together with head turns and nods with eyes open/eyes closed 30 seconds each position.  Tandem gait, marching, tandem march x 20 ft each with cues to keep eyes on visual target.  On red/blue compliant mat surfaces:  Forward/back walking, forward/back marching, front crossover steps, back crossover steps, braiding, then sidestepping with step taps to cones with min guard assistance. On blue foam mat-tandem stance with eyes open/eyes closed with head turns, head nods, with min guard assistance.                             PT Long Term Goals - 08/24/14 1336    PT LONG TERM GOAL #1   Title Pt will be independent with HEP for improved balance and gait.   Time 4   Period Weeks   Status New   PT LONG TERM GOAL #2   Title Functional Gait assessment to be  completed, with pt scoring at least 22/30 for decreased fall risk.   PT LONG TERM GOAL #3   Title Pt will report improvement in ABC scale score by 10% to demonstrate improved balance confidence.   Time 4   Period Weeks   Status New   PT LONG TERM GOAL #4   Title Pt will be able to ambulate at least 1000 ft indoor and outdoor surfaces, independently without loss of balance.   Time 4   Period Weeks   Status New               Plan - 09/02/14 1231    Clinical Impression Statement Pt tolerates exercises well in clinic-needs cues for slowed pacing of exercises for improved lower extremity strength and control.   Pt will benefit from skilled therapeutic intervention in order to improve on the following deficits Abnormal gait;Decreased  balance   Rehab Potential Good   PT Frequency 2x / week   PT Duration 4 weeks   PT Treatment/Interventions ADLs/Self Care Home Management;Gait training;Functional mobility training;Stair training;Neuromuscular re-education;Balance training;Therapeutic exercise;Therapeutic activities;Patient/family education   PT Next Visit Plan high level balance and strength; lower extremity stretching, discuss community fitness options   Consulted and Agree with Plan of Care Patient        Problem List Patient Active Problem List   Diagnosis Date Noted  . Subdural hematoma 09/30/2013  . Headache(784.0) 09/30/2013  . Sinoatrial node dysfunction 06/01/2013  . Exposure to bat without known bite 09/28/2012  . Pacemaker-MDT 07/02/2011  . Night sweats 07/02/2011  . DIZZINESS 03/22/2010  . Impaired fasting glucose 11/02/2009  . OBSTRUCTIVE SLEEP APNEA 11/21/2008  . BACK PAIN 11/03/2008  . Overweight 11/02/2007  . Essential hypertension 11/02/2007  . ALLERGIC RHINITIS 11/02/2007  . GASTROESOPHAGEAL REFLUX DISEASE 11/02/2007  . Diverticulosis of large intestine 11/02/2007  . BPH (benign prostatic hypertrophy) with urinary obstruction 11/02/2007  . SYNCOPE 11/02/2007  . Hereditary and idiopathic peripheral neuropathy 10/30/2007  . IRRITABLE BOWEL SYNDROME 10/30/2007  . Osteoarthritis 10/30/2007    Larence Thone W. 09/02/2014, 12:33 PM   Mady Haagensen, PT 09/02/2014 12:34 PM Phone: [phone] Fax: [facility] [address] [place], [place], [address] Phone: [phone]   Fax:  [phone]

## 2014-09-05 ENCOUNTER — Ambulatory Visit: Payer: Medicare Other | Admitting: Physical Therapy

## 2014-09-09 ENCOUNTER — Ambulatory Visit: Payer: Medicare Other | Admitting: Physical Therapy

## 2014-09-12 ENCOUNTER — Ambulatory Visit: Payer: Medicare Other | Admitting: Physical Therapy

## 2014-09-16 ENCOUNTER — Encounter: Payer: Medicare Other | Admitting: Physical Therapy

## 2014-09-16 ENCOUNTER — Ambulatory Visit: Payer: Medicare Other | Admitting: Physical Therapy

## 2014-09-16 DIAGNOSIS — R269 Unspecified abnormalities of gait and mobility: Secondary | ICD-10-CM | POA: Diagnosis not present

## 2014-09-16 NOTE — Therapy (Signed)
[facility] [address] [place], [place], [address] Phone: [phone]   Fax:  [phone]  Patient Details  Name: Tony Cruz MRN: [id number] Date of Birth: 1934-04-24 Referring Provider:  No ref. provider found  Encounter Date: 09/16/2014 PHYSICAL THERAPY DISCHARGE SUMMARY  Visits from Start of Care: 4  Current functional level related to goals / functional outcomes:     PT Long Term Goals - 08/24/14 1336    PT LONG TERM GOAL #1   Title Pt will be independent with HEP for improved balance and gait.   Time 4   Period Weeks   Status New   PT LONG TERM GOAL #2   Title Functional Gait assessment to be completed, with pt scoring at least 22/30 for decreased fall risk.   PT LONG TERM GOAL #3   Title Pt will report improvement in ABC scale score by 10% to demonstrate improved balance confidence.   Time 4   Period Weeks   Status New   PT LONG TERM GOAL #4   Title Pt will be able to ambulate at least 1000 ft indoor and outdoor surfaces, independently without loss of balance.   Time 4   Period Weeks   Status New    Goals not met, as pt did not return to PT after 09/02/14 PT session.  Pt called and requested to cancel remaining appointments due to financial concerns.   Remaining deficits: High level balance, decreased vestibular system use for balance   Education / Equipment: HEP  Plan: Patient agrees to discharge.  Patient goals were not met. Patient is being discharged due to not returning since the last visit.  ?????      Shermaine Rivet W. 09/16/2014, 12:27 PM   Mady Haagensen, PT 09/16/2014 12:29 PM Phone: [phone] Fax: [facility] [address] [place], [place], [address] Phone: [phone]   Fax:  [phone]

## 2014-09-19 ENCOUNTER — Ambulatory Visit: Payer: Medicare Other | Admitting: Physical Therapy

## 2014-09-23 ENCOUNTER — Ambulatory Visit: Payer: Medicare Other | Admitting: Physical Therapy

## 2014-10-05 ENCOUNTER — Ambulatory Visit (INDEPENDENT_AMBULATORY_CARE_PROVIDER_SITE_OTHER): Payer: Medicare Other | Admitting: *Deleted

## 2014-10-05 DIAGNOSIS — I495 Sick sinus syndrome: Secondary | ICD-10-CM

## 2014-10-05 LAB — MDC_IDC_ENUM_SESS_TYPE_REMOTE
Battery Voltage: 2.72 V
Brady Statistic AP VP Percent: 0 %
Brady Statistic AP VS Percent: 0 %
Brady Statistic AS VP Percent: 0 %
Brady Statistic AS VS Percent: 100 %
Lead Channel Impedance Value: 671 Ohm
Lead Channel Pacing Threshold Amplitude: 0.75 V
Lead Channel Sensing Intrinsic Amplitude: 0.7 mV
Lead Channel Setting Pacing Amplitude: 2 V
Lead Channel Setting Pacing Pulse Width: 0.4 ms
MDC IDC MSMT BATTERY IMPEDANCE: 2180 Ohm
MDC IDC MSMT BATTERY REMAINING LONGEVITY: 25 mo
MDC IDC MSMT LEADCHNL RA PACING THRESHOLD AMPLITUDE: 0.625 V
MDC IDC MSMT LEADCHNL RA PACING THRESHOLD PULSEWIDTH: 0.4 ms
MDC IDC MSMT LEADCHNL RV IMPEDANCE VALUE: 735 Ohm
MDC IDC MSMT LEADCHNL RV PACING THRESHOLD PULSEWIDTH: 0.4 ms
MDC IDC MSMT LEADCHNL RV SENSING INTR AMPL: 5.6 mV
MDC IDC SESS DTM: 20160309013227
MDC IDC SET LEADCHNL RV PACING AMPLITUDE: 2.5 V
MDC IDC SET LEADCHNL RV SENSING SENSITIVITY: 2 mV

## 2014-10-05 NOTE — Progress Notes (Signed)
Remote pacemaker transmission.   

## 2014-10-10 ENCOUNTER — Encounter: Payer: Self-pay | Admitting: Cardiology

## 2014-10-12 ENCOUNTER — Other Ambulatory Visit: Payer: Self-pay | Admitting: Pulmonary Disease

## 2014-10-13 ENCOUNTER — Encounter: Payer: Self-pay | Admitting: Internal Medicine

## 2014-10-20 DIAGNOSIS — M159 Polyosteoarthritis, unspecified: Secondary | ICD-10-CM | POA: Diagnosis not present

## 2014-11-10 DIAGNOSIS — G4733 Obstructive sleep apnea (adult) (pediatric): Secondary | ICD-10-CM | POA: Diagnosis not present

## 2014-12-08 ENCOUNTER — Ambulatory Visit: Payer: Medicare Other | Admitting: Neurology

## 2014-12-08 DIAGNOSIS — D1801 Hemangioma of skin and subcutaneous tissue: Secondary | ICD-10-CM | POA: Diagnosis not present

## 2014-12-08 DIAGNOSIS — L821 Other seborrheic keratosis: Secondary | ICD-10-CM | POA: Diagnosis not present

## 2014-12-08 DIAGNOSIS — L570 Actinic keratosis: Secondary | ICD-10-CM | POA: Diagnosis not present

## 2014-12-08 DIAGNOSIS — L720 Epidermal cyst: Secondary | ICD-10-CM | POA: Diagnosis not present

## 2014-12-08 DIAGNOSIS — Z85828 Personal history of other malignant neoplasm of skin: Secondary | ICD-10-CM | POA: Diagnosis not present

## 2014-12-21 ENCOUNTER — Ambulatory Visit: Payer: Medicare Other | Admitting: Neurology

## 2015-01-03 ENCOUNTER — Other Ambulatory Visit (INDEPENDENT_AMBULATORY_CARE_PROVIDER_SITE_OTHER): Payer: Medicare Other

## 2015-01-03 ENCOUNTER — Encounter: Payer: Self-pay | Admitting: Pulmonary Disease

## 2015-01-03 ENCOUNTER — Ambulatory Visit (INDEPENDENT_AMBULATORY_CARE_PROVIDER_SITE_OTHER): Payer: Medicare Other | Admitting: Pulmonary Disease

## 2015-01-03 ENCOUNTER — Ambulatory Visit (INDEPENDENT_AMBULATORY_CARE_PROVIDER_SITE_OTHER)
Admission: RE | Admit: 2015-01-03 | Discharge: 2015-01-03 | Disposition: A | Discharge status: Home / Self Care | Payer: Medicare Other | Source: Ambulatory Visit | Attending: Pulmonary Disease | Admitting: Pulmonary Disease

## 2015-01-03 VITALS — BP 142/78 | HR 60 | Temp 97.7°F | Wt 198.4 lb

## 2015-01-03 DIAGNOSIS — R7301 Impaired fasting glucose: Secondary | ICD-10-CM

## 2015-01-03 DIAGNOSIS — I10 Essential (primary) hypertension: Secondary | ICD-10-CM

## 2015-01-03 DIAGNOSIS — N138 Other obstructive and reflux uropathy: Secondary | ICD-10-CM

## 2015-01-03 DIAGNOSIS — Z950 Presence of cardiac pacemaker: Secondary | ICD-10-CM

## 2015-01-03 DIAGNOSIS — I495 Sick sinus syndrome: Secondary | ICD-10-CM

## 2015-01-03 DIAGNOSIS — K5730 Diverticulosis of large intestine without perforation or abscess without bleeding: Secondary | ICD-10-CM

## 2015-01-03 DIAGNOSIS — M150 Primary generalized (osteo)arthritis: Secondary | ICD-10-CM

## 2015-01-03 DIAGNOSIS — M159 Polyosteoarthritis, unspecified: Secondary | ICD-10-CM

## 2015-01-03 DIAGNOSIS — K589 Irritable bowel syndrome without diarrhea: Secondary | ICD-10-CM

## 2015-01-03 DIAGNOSIS — Z79899 Other long term (current) drug therapy: Secondary | ICD-10-CM | POA: Diagnosis not present

## 2015-01-03 DIAGNOSIS — G609 Hereditary and idiopathic neuropathy, unspecified: Secondary | ICD-10-CM

## 2015-01-03 DIAGNOSIS — K219 Gastro-esophageal reflux disease without esophagitis: Secondary | ICD-10-CM

## 2015-01-03 DIAGNOSIS — N401 Enlarged prostate with lower urinary tract symptoms: Secondary | ICD-10-CM

## 2015-01-03 LAB — VITAMIN B12: Vitamin B-12: 594 pg/mL (ref 211–911)

## 2015-01-03 LAB — HEPATIC FUNCTION PANEL
ALT: 27 U/L (ref 0–53)
AST: 22 U/L (ref 0–37)
Albumin: 4.4 g/dL (ref 3.5–5.2)
Alkaline Phosphatase: 60 U/L (ref 39–117)
Bilirubin, Direct: 0.1 mg/dL (ref 0.0–0.3)
Total Bilirubin: 0.7 mg/dL (ref 0.2–1.2)
Total Protein: 7.4 g/dL (ref 6.0–8.3)

## 2015-01-03 LAB — LIPID PANEL
CHOL/HDL RATIO: 3
CHOLESTEROL: 162 mg/dL (ref 0–200)
HDL: 55.9 mg/dL (ref >39.00)
LDL Cholesterol: 85 mg/dL (ref 0–99)
NonHDL: 106.1
TRIGLYCERIDES: 108 mg/dL (ref 0.0–149.0)
VLDL: 21.6 mg/dL (ref 0.0–40.0)

## 2015-01-03 LAB — URINALYSIS
Bilirubin Urine: NEGATIVE
Hgb urine dipstick: NEGATIVE
Ketones, ur: NEGATIVE
Leukocytes, UA: NEGATIVE
NITRITE: NEGATIVE
SPECIFIC GRAVITY, URINE: 1.015 (ref 1.000–1.030)
Total Protein, Urine: NEGATIVE
URINE GLUCOSE: NEGATIVE
Urobilinogen, UA: 0.2 (ref 0.0–1.0)
pH: 7.5 (ref 5.0–8.0)

## 2015-01-03 LAB — CBC WITH DIFFERENTIAL/PLATELET
Basophils Absolute: 0 10*3/uL (ref 0.0–0.1)
Basophils Relative: 0.4 % (ref 0.0–3.0)
Eosinophils Absolute: 0.8 10*3/uL — ABNORMAL HIGH (ref 0.0–0.7)
Eosinophils Relative: 10.4 % — ABNORMAL HIGH (ref 0.0–5.0)
HEMATOCRIT: 41.1 % (ref 39.0–52.0)
HEMOGLOBIN: 13.7 g/dL (ref 13.0–17.0)
LYMPHS ABS: 2 10*3/uL (ref 0.7–4.0)
LYMPHS PCT: 27.6 % (ref 12.0–46.0)
MCHC: 33.4 g/dL (ref 30.0–36.0)
MCV: 93.7 fl (ref 78.0–100.0)
Monocytes Absolute: 0.7 10*3/uL (ref 0.1–1.0)
Monocytes Relative: 10.2 % (ref 3.0–12.0)
NEUTROS ABS: 3.8 10*3/uL (ref 1.4–7.7)
Neutrophils Relative %: 51.4 % (ref 43.0–77.0)
Platelets: 287 10*3/uL (ref 150.0–400.0)
RBC: 4.38 Mil/uL (ref 4.22–5.81)
RDW: 15 % (ref 11.5–15.5)
WBC: 7.3 10*3/uL (ref 4.0–10.5)

## 2015-01-03 LAB — TSH: TSH: 1.23 u[IU]/mL (ref 0.35–4.50)

## 2015-01-03 LAB — BASIC METABOLIC PANEL
BUN: 14 mg/dL (ref 6–23)
CALCIUM: 9.3 mg/dL (ref 8.4–10.5)
CO2: 30 mEq/L (ref 19–32)
Chloride: 100 mEq/L (ref 96–112)
Creatinine, Ser: 0.89 mg/dL (ref 0.40–1.50)
GFR: 87.62 mL/min (ref >60.00)
Glucose, Bld: 114 mg/dL — ABNORMAL HIGH (ref 70–99)
Potassium: 4.4 mEq/L (ref 3.5–5.1)
Sodium: 136 mEq/L (ref 135–145)

## 2015-01-03 LAB — HEMOGLOBIN A1C: Hgb A1c MFr Bld: 6 % (ref 4.6–6.5)

## 2015-01-03 NOTE — Progress Notes (Signed)
Subjective:    Patient ID: LARWENCE TU, male    DOB: 1933-09-04, 81 y.o.   MRN: [id number]  HPI 81 y/o WM here for a follow up visit... he has multiple medical problems as noted below...  ~  SEE PREV EPIC NOTES FOR OLDER DATA >>    LABS 7/14:  FLP- ok x TG=190 on diet alone;  Chems- ok x BS=114 A1c=6.6;  CBC- wnl;  TSH=1.27;  PSA=0.89;  Uric=5.9.Marland KitchenMarland Kitchen  ~  December 28, 2013:  67yr [name] states he had a "crummy year"> notes that he fell on the ice 2/15 & hit his head, went to the ER w/ skull fx & subdural hematoma;  He has seen DrAquino for [facility], DrNudelman w/ conservative management but he developed residual HA, dizziness, and BPPV- ultimately sent to DrShoemaker w/ Epley maneuvers & finally improved...  He has also had BP issues w/ pressure in the 150/90 range so we called in Amlodipine 5mg /d- now BP improved to the 130/80 range...  We reviewed the following medical problems during today's office visit >>     OSA> his eval & initial therapy was done by the [place]; followed by DrClance w/ good compliance & no issues w/ sleep or daytime hypersomnolence...     HBP> on ASA81, Cozaar100 & Amlod5; BP= 132/88 & he denies CP, palpit, SOB, edema; DrKlein stopped his Hct due to dizziness......    HxSyncope, sinus node dysfunction, Pacer> followed by DrKlein, pacer function normally, no issues ident...    BorderlineDM> on diet alone; Labs 6/15 showed BS=121, A1c=6.4; we reviewed diet, exercise, wt reduction...     Overweight> weight= 218# and BMI= 33... We reviewed diet, exercise, wt reduction strategies...    GI- GERD, Barrett's, Divertics, IBS> on Prilosec20; known [place], HxBarrett's, prev Rx by DrWeissman & now DrPyrtle at [facility] w/ EGD 11/14 showing gastritis, esophagitis, gastric polyps (path=+Barretts, reflux related injury, neg HPylori); colonoscopy 11/14 showing mod divertics, & 51mm polyp in cecum (tub adenoma)...    GU- BPH> on Flomax0.4 & Proscar5; followed by DrRDavis=> DrEskridge    DJD,  LBP> on Naprosyn, Tramadol; c/o hands and ankles stiff but he still plays golf; eval by Kirby Funk & he released him "I've done all I can do for you"    Fall 2/15 w/ nondisplaced skull fx and subdural hematoma> treated conservatively, he had some resid HA, dizzy, etc & treated w/ Epley maneuvers- improved...    Periph neuropathy> thorough eval 2011 by DrReynolds & Hickling- idiopathic; not on specific therapy for this...    Skin cancer> he had a squamous cell removed from his left cheek 5/14 by DrDJones... We reviewed prob list, meds, xrays and labs> see below for updates >> we gave him the PREVNAR-13 vaccination today...  CXR 6/15 showed normal heart size, mild tortuous Ao, pacer, clear lungs, NAD...  LABS 6/15:  FLP- wnl on diet;  Chems- wnl x BS=121, A1c=6.4;  CBC- wnl;  TSH=1.73;  VitD=49...   ~  July 01, 2014:  66mo [name] brought a list of problems to discuss today> He notes decreased smell and taste, exam is neg and we discussed trial of Rx w/ Antihist, Flonase, nasal saline;  He has lost 16# on diet & exercise down to 202# (BMI=30.7), he is encouraged to keep up the good work & get weight down further;  He is c/o some neuropathy discomfort in feet but no real pain, he is encouraged to continue wt reduction, low carb diet, use Tylenol prn;  He notes arthritis in his hands & he has seen DrAnderson in the past who rec aleve prn ("he could help me if I had Rheumatoid arthritis")...     HBP> on ASA81, Cozaar100 & Amlod5; BP= 120/84 & he denies CP, palpit, SOB, edema; DrKlein prev stopped his Hct due to dizziness...Marland KitchenMarland KitchenMarland Kitchen    BorderlineDM> on diet alone; weight is down 16# to 202# today; Labs 6/15 showed BS=121, A1c=6.4; we reviewed diet, exercise, wt reduction...     GI- GERD, Barrett's, Divertics, IBS> on Pantoprazole40; known [place], HxBarrett's, prev Rx by DrWeissman & now DrPyrtle at [facility] w/ EGD 11/14 showing gastritis, esophagitis, gastric polyps (path=+Barretts, reflux related injury, neg  HPylori); colonoscopy 11/14 showing mod divertics, & 80mm polyp in cecum (tub adenoma)...    GU- BPH> on Flomax0.4 & Proscar5; followed by DrRDavis=> DrEskridge...    DJD, LBP> on Naprosyn, Tylenol; c/o hands and ankles stiff but he still plays golf; eval by Kirby Funk & he released him "I've done all I can do for you"    Fall 2/15 w/ nondisplaced skull fx and subdural hematoma> treated conservatively, he had some resid HA, dizzy, etc & treated w/ Epley maneuvers- improved...    Periph neuropathy> thorough eval 2011 by DrReynolds & Hickling- idiopathic; min discomfort & not on specific therapy for this... We reviewed prob list, meds, xrays and labs> see below for updates >>   ~  January 03, 2015:  18mo ROV & Rue[place] reports stable- "just aging" he says;  He brought a list of complaints/ questions today:  They are disrupted at home w/ kitchen remodel; notes his urine is darker than usual (UA is clear- asked to incr water intake); taking Naprosyn & worried about renal (Cr=0.89 & reassured); +FamHx DM & he wants another A1c (BS=114, A1c=6.0);  Neuropathy eval by DrAquino- reviewed w/ pt- not a lot of pain, mild burning, friend takes Gabapentin; disturbance of taste & worried about B12, Zinc, Copper (B12=594); leg cramps in thigh area- trying mustard, Tumeric; athritis pains- stiff, swelling, esp in hands... We reviewed the following medical problems during today's office visit >>     AR> on Claritin & Flonase added for "swelling" sensation... Note 10% eos on CBC...    OSA> followed by DrClance, stable on CPAP, continue same...    HBP> on ASA81, Cozaar100 & Amlod5; BP= 142/78 & he denies CP, palpit, SOB, edema; DrKlein prev stopped his Hct due to dizziness...Marland KitchenMarland KitchenMarland Kitchen    BorderlineDM> on diet alone; weight is down 3# to 198# today; Labs 6/16 showed BS=114, A1c=6.0; we reviewed diet, exercise, wt reduction...     GI- GERD, Barrett's, Divertics, IBS> on Pantoprazole40; known [place], HxBarrett's, prev Rx by DrWeissman & now  DrPyrtle at [facility] w/ EGD 11/14 showing gastritis, esophagitis, gastric polyps (path=+Barretts, reflux related injury, neg HPylori); colonoscopy 11/14 showing mod divertics, & 74mm polyp in cecum (tub adenoma)...    GU- BPH> on Flomax0.4 & Proscar5; followed by DrRDavis=> DrEskridge... Asked to take meds regularly.    DJD, LBP> on Naprosyn, Tramadol, Tylenol; c/o hands and ankles stiff but he still plays golf; eval by Kirby Funk & he released him "I've done all I can do for you"    Fall 2/15 w/ nondisplaced skull fx and subdural hematoma> treated conservatively, he had some resid HA, dizzy, etc & treated w/ Epley maneuvers- improved...    Periph neuropathy> thorough eval 2011 by DrReynolds & Hickling- idiopathic; 2nd opinion DrAquino- min discomfort & not on specific therapy for this...    Anxious> aware,  not on meds... We reviewed prob list, meds, xrays and labs> see below for updates >>   CXR 6/16 showed norm heart size, pacer, clear lungs, NAD...  LABS 6/16:  FLP- at goals on diet;  Chems- ok x BS=114, A1c=6.0;  CBC- wnl x 10%eos;  TSH=1.23;  B12=594;  UA- clear, neg protein.... PLAN>>  All questions answered, stable on current med regimen; Flonase added, call for problems...          Problem List:   OBSTRUCTIVE SLEEP APNEA (ICD-327.23) - evaluation, diagnosis, and Rx by the [facility] (their notes scanned into the EMR):  he had PSG 4/08 w/ AHI= 26, desat to 83%... placed on CPAP at 11cm H20 pressure... he also has some RLS & was on Requip transiently from the [place]. ~  4/11: pt indicates that he uses his CPAP 4/7 nights for 6-7 hr/night; he feels that he rests well, denies daytime hypersom, snoring, etc... he requests Sleep consult here in [place] (see DrClance consult). ~  5/13: he saw DrClance for Sleep f/u> noted 10# wt gain, pt reported using CPAP compliantly w/ good daytime alertness, no issues w/ mask but requesting new machine ([employer]) as his is >13yrs old; requested to work on wt reduction... ~   6/15: his eval & initial therapy was done by the [facility]; followed by DrClance w/ good compliance & no issues w/ sleep or daytime hypersomnolence.  ALLERGIC RHINITIS (ICD-477.9) - controlled on Claritin, and OTC meds... ~  12/15: we discussed trial of Antihist, Flonase, nasal saline to help his smell & taste...   HYPERTENSION (ICD-401.9) >>  ~  on ASA 81mg /d & HCTZ 25mg /d...  ~  CXR 4/11 is clear and WNL, NAD... ~  6/12:  BP=122/82 today and he denies HA, fatigue, visual changes, CP, palipit, dizziness, syncope, dyspnea, edema, etc... ~  6/13:  BP= 142/98 & he admits intermittently elev at home; we discussed change med to LOSARTAN/ HCT 100/12.5 daily; he will monitor BP & f/u here... ~  11/13:  BP= 132/88 & similar at home; denies CP, palpit, SOB, edema... ~  6/14:  BP= 126/84 & he remains asymptomatic... ~  6/15:  on ASA81, Cozaar100 & Amlod5; BP= 132/88 & he denies CP, palpit, SOB, edema; DrKlein stopped his Hct due to dizziness. ~  12/15: on ASA81, Cozaar100 & Amlod5; BP= 120/84 & he remains essentially asymptomatic...  Hx of SYNCOPE (ICD-780.2) & CARDIAC PACEMAKER IN SITU (ICD-V45.01) - he had pacer changed 11/08 DrKlein (hosp DC Summary reviewed)... he continues regular pacer checks and f/u w/ DrKlein...  ~  cath 3/99 DrJoeLeB showed norm coronaries, norm LV, norm Ao... ~  He saw DrKlein 12/11 for f/u syncope secondary to sinus node dysfunction, treated w/ pacer & generator replaced 2008;  He noted intercurrent syncope that was felt to be vasovagal in origin (this occurred in the Cards clinic 8/11 while waiting for his wife, & he has a long hx of this from blood draws over the yrs) & DrKlein reprogramed his device; EKG w/ SBrady & otherw WNL... ~  He saw DrKlein for Cards/EP f/u 12/12> hx syncope related to [facility] dysfunction- devise working properly & no changes made to the programming; TSH & sed rate wetre checked and WNL... ~  Yearly f/u DrKlein 12/13> doing satis w/o intercurrent syncope,  pacer working satis & f/u planned 30yr... ~  12/14: he had yearly ROV w/ DrKlein, doing satis & pacer OK, no changes made...  DIABETES MELLITUS, BORDERLINE (ICD-790.29) - he's been concerned about  his BS due to [facility] and his neuropathy symptoms, but his BS has only been borderline elvated prev...  ~  labs 4/10 (wt=209#) showed BS= 113, A1c= 6.3.Marland Kitchen. rec> better diet, get wt down. ~  labs 4/11 (wt=222#) showed BS= 109, A1c= 6.2.Marland Kitchen. no meds yet, just needs to get wt down. ~  Labs 6/12 (wt=204#) showed BS= 102, A1v= 6.4 ~  Labs 6/13 (wt=213#) showed BS= 105, A1c= 6.4 ~  Labs 6/14 showed BS= 114, A1c= 6.6 ~  Labs 6/15 showed BS= 121, A1c= 6.4  OVERWEIGHT (ICD-278.02) - we discussed diet + exercise therapy (again)... ~  weight 4/09 = 226#.Marland KitchenMarland Kitchen 5" 9" Tall for a BMI= 33-34 ~  weight 4/10 = 209#... BMI down to 31... keep up the good work. ~  weight 4/11 = 222# .Marland KitchenMarland Kitchen BMI up to 33. ~  Weight 6/12 = 204# ~  Weight 6/13 = 213# ~  Weight 6/14 = 214# ~  Weight 6/15 = 218# ~  Weight 12/15 = 202# keep up the good work...  GASTROESOPHAGEAL REFLUX DISEASE (ICD-530.81) w/ BARRETT'S ESOPH - followed by DrWeissman & Rx w/ OMEPRAZOLE 20mg Bid... ~  prev EGD's documented a [place] & Barrett's esoph... (last 1/03 by DrPatterson). ~  last EGD was 2/08 showing Barrett's epithelium in lower 1/3 of esoph... f/u planned 35yrs. ~  4/11:  NOTE> f/u EGD is due, he will decide on gastroenterologist ==> he saw DrMJohnson w/ EGD 5/11 showing Irreg Z-line & bx c/w Barrett's esoph (no dysplasia), the rest of the esoph, stomach, & duodenum were WNL... ~  EGD 11/14 by DrPyrtle showed gastritis, esophagitis, gastric polyps (path=+Barretts, reflux related injury, neg HPylori)...  DIVERTICULOSIS OF COLON (ICD-562.10)  IRRITABLE BOWEL SYNDROME (ICD-564.1) ~  last colonoscopy 2/08 by DrWeissman showed left sided divertics, otherw neg... f/u planned 2yrs... ~  Colonoscopy 11/14 by DrPyrtle showed mod divertics, & 38mm polyp in cecum (tub  adenoma)...  BENIGN PROSTATIC HYPERTROPHY, WITH OBSTRUCTION (ICD-600.01) - on FLOMAX 0.4mg /d & PROSCAR 5mg /d... eval and Rx by [facility] who checks pt q26mo (we don't have notes from him)... pt tells me he will be seeing DrDavis regularly when he moves to [facility]. ~  9/13: pt reports that he is now seeing DrEskridge at [facility] & his PSAs have all been normal; his major prob has been holding his urine & eval revealed not emptying completely- they have tried several meds to help this; he will have notes sent to [place] for [facility] records.  OSTEOARTHRITIS (ICD-715.90) >> BACK PAIN (ICD-724.5) >>  ~  managed by DrAnderson & difficult problem, thought to have an inflamm arthropathy (?variation of psoriatic arthritis) however he did not respond to NSAIDs/Celebrex, Pred, MTX, anti-TNF inhibitor; on Tramadol prn;  ~  pt saw DrBeane 6/10 for eval> neuropathy symptoms & lumbar spondylosis on XRays, Rx Mobic Prn... offered Myelogram for further eval... he also takes Calcium & Vit D. ~  He was evaluated by Kirby Funk for Rheum => on Naprosyn, Tylenol; c/o hands and ankles stiff but he still plays golf etc; states that they released him "I've done all I can do for you, if you had RA I could help you"  FALL w/ SUBDURAL HEMATOMA >> treated conservatively, he had some resid HA, dizzy, etc & treated w/ Epley maneuvers- improved CT Head 4/15 showed mild atrophy & sm vessel dis & scat lacunes, prev left parietal extra-axial fluid collection is resolved...   PERIPHERAL NEUROPATHY (ICD-356.9) - DrReynolds evaluated him w/ NCV's etc and dx a peripheral neuropathy... this is  one of his CC> numbness, decr sensation in feet, (no pain) w/ some assoc balance problems intermittently...  he tried Mentanx but no benefit...  ~  4/11: I reviewed DrReynolds 11/10 note w/ the patient... ~  He also had a thorough neurologic evaluation 10/11 from one of his church physicians, DrHickling, for f/u polyneuropathy> balance issues, LBP, gait  abn, variable paresthesias, foot numbness (sl decr vibration & pin prick), some subjective memory problems w/ norm MMSE, abnormal NCVs;  Prev Labs were all WNL, unrevealing;  etiology of his neuropathy is unknown= cryptogenic, and since it isn't painful he really didn't require any therapy... ~  6/13: he continues to experience prob w/ balance etc related to his neuropathy; not progressive & still not painful; also notes nocturnal leg cramps & he's tried bar of soap trick, mustard, etc- all w/o relief (we rec trial Tonic water)... ~  2015> Neuro follow up by DrAquino for [facility]...  Health Maintenance - he takes a number of Vits + lecithin, saw palmetto, Fish Oil, etc... he saw DrDJones in 2010 for Derm review- pt reports nothing major found...   Past Surgical History  Procedure Laterality Date  . Appendectomy    . Cholecystectomy    . Tonsillectomy    . Pacemaker placement    . Skin cancer removed  12/2012    removed from his face--Dr. Sarajane Jews    Outpatient Encounter Prescriptions as of 01/03/2015  Medication Sig  . acetaminophen (TYLENOL) 500 MG tablet Take 1,000 mg by mouth every 6 (six) hours as needed for pain.  Marland Kitchen amLODipine (NORVASC) 5 MG tablet Take 1 tablet by mouth  daily  . aspirin 81 MG tablet Take 81 mg by mouth daily.  . calcium carbonate (OS-CAL) 600 MG TABS Take 600 mg by mouth daily.    . Cholecalciferol (VITAMIN D) 1000 UNITS capsule Take 1,000 Units by mouth daily.    . finasteride (PROSCAR) 5 MG tablet Take 1 tablet by mouth daily.  . Lecithin 1200 MG CAPS Take 1 capsule by mouth daily.    Marland Kitchen loratadine (CLARITIN) 10 MG tablet Take 10 mg by mouth daily.    Marland Kitchen losartan (COZAAR) 100 MG tablet Take 1 tablet by mouth  daily  . Multiple Vitamin (MULTIVITAMIN) capsule Take 1 capsule by mouth daily.    . naproxen (NAPROSYN) 500 MG tablet Take 500 mg by mouth 2 (two) times daily.   . Omega-3 Fatty Acids (DIALYVITE OMEGA-3 CONCENTRATE) 600 MG CAPS Take 1 capsule by mouth daily.   . pantoprazole (PROTONIX) 40 MG tablet Take 1 tablet by mouth  daily  . Tamsulosin HCl (FLOMAX) 0.4 MG CAPS Take 0.4 mg by mouth daily.    . traMADol (ULTRAM) 50 MG tablet Take 50 mg by mouth 3 (three) times daily.   . [DISCONTINUED] pantoprazole (PROTONIX) 40 MG tablet Take 1 tablet (40 mg total) by mouth daily.   No facility-administered encounter medications on file as of 01/03/2015.    Allergies  Allergen Reactions  . Shellfish Allergy     Sensitive to shellfish    Current Medications, Allergies, Past Medical History, Past Surgical History, Family History, and Social History were reviewed in [employer] record.   Review of Systems        The patient complains of sleep disorder, dyspnea on exertion, gas/bloating, joint pain, stiffness, arthritis, paresthesias, and difficulty walking.  The patient denies fever, chills, sweats, anorexia, fatigue, weakness, malaise, weight loss, blurring, diplopia, eye irritation, eye discharge, vision loss,  eye pain, photophobia, earache, ear discharge, tinnitus, decreased hearing, nasal congestion, nosebleeds, sore throat, hoarseness, chest pain, palpitations, syncope, orthopnea, PND, peripheral edema, cough, dyspnea at rest, excessive sputum, hemoptysis, wheezing, pleurisy, nausea, vomiting, diarrhea, constipation, change in bowel habits, abdominal pain, melena, hematochezia, jaundice, indigestion/heartburn, dysphagia, odynophagia, dysuria, hematuria, urinary frequency, urinary hesitancy, nocturia, incontinence, back pain, joint swelling, muscle cramps, muscle weakness, sciatica, restless legs, leg pain at night, leg pain with exertion, rash, itching, dryness, suspicious lesions, paralysis, seizures, tremors, vertigo, transient blindness, frequent falls, frequent headaches, depression, anxiety, memory loss, confusion, cold intolerance, heat intolerance, polydipsia, polyphagia, polyuria, unusual weight change, abnormal bruising, bleeding,  enlarged lymph nodes, urticaria, allergic rash, hay fever, and recurrent infections.     Objective:   Physical Exam     WD, WN, 81 y/o WM in NAD... GENERAL:  Alert & oriented; pleasant & cooperative... HEENT:  [place]/AT, EOM-wnl, PERRLA, EACs-clear, TMs-wnl, NOSE-clear, THROAT-clear & wnl. NECK:  Supple w/ fairROM; no JVD; normal carotid impulses w/o bruits; no thyromegaly or nodules palpated; no lymphadenopathy. CHEST:  Clear to P & A; without wheezes/ rales/ or rhonchi. HEART:  Regular Rhythm; without murmurs/ rubs/ or gallops; pacer in left shoulder area... ABDOMEN:  Soft & nontender; normal bowel sounds; no organomegaly or masses detected. EXT: without deformities, mild arthritic changes; no varicose veins/ +venous insuffic/ no edema. NEURO:  CN's intact;  fair gait;  Motor normal, sensory variable deficits... strength seems OK, min decr sensation in LEs. DERM:  No lesions noted; no rash etc...  RADIOLOGY DATA:  Reviewed in the EPIC EMR & discussed w/ the patient...  LABORATORY DATA:  Reviewed in the EPIC EMR & discussed w/ the patient...   Assessment & Plan:    OSA>  Followed by DrClance & stable on CPAP; we will try to get him a new machine thru [employer]...  HBP>  His BP is improved on Cozaar & Amlodipine, continue same...  Hx Syncope/ Pacer>  Followed by DrKlein & pacer functioning normally...  DM, borderline>  On diet alone & managing well, A1c remains 6.4 & we reviewed the import of wt reduction...  GERD, Hx Barrett's>  S/p EGD from DrPyrtle w/ small segment Barrett's mucosa, no dysplasia...  Divertics, IBS>  Colonoscopy 11/14 w/ 28mm tub adenoma removed...  BPH>  Followed by DrEskridge at [facility]; biggest issue is control & he says it was from not emptying completely, they have tried several meds and continue to follow up regularly, he reports PSAs have been ok; he will get notes to [place]...  DJD>  Followed by Kirby Funk & still having difficulty w/ pain &  stiffness...  Periph Neuropathy>  Eval by Neuro, cryptogenic, no meds needed & symptoms remain the same...   Patient's Medications  New Prescriptions   No medications on file  Previous Medications   ACETAMINOPHEN (TYLENOL) 500 MG TABLET    Take 1,000 mg by mouth every 6 (six) hours as needed for pain.   AMLODIPINE (NORVASC) 5 MG TABLET    Take 1 tablet by mouth  daily   ASPIRIN 81 MG TABLET    Take 81 mg by mouth daily.   CALCIUM CARBONATE (OS-CAL) 600 MG TABS    Take 600 mg by mouth daily.     CHOLECALCIFEROL (VITAMIN D) 1000 UNITS CAPSULE    Take 1,000 Units by mouth daily.     FINASTERIDE (PROSCAR) 5 MG TABLET    Take 1 tablet by mouth daily.   LECITHIN 1200 MG CAPS    Take 1  capsule by mouth daily.     LORATADINE (CLARITIN) 10 MG TABLET    Take 10 mg by mouth daily.     LOSARTAN (COZAAR) 100 MG TABLET    Take 1 tablet by mouth  daily   MULTIPLE VITAMIN (MULTIVITAMIN) CAPSULE    Take 1 capsule by mouth daily.     NAPROXEN (NAPROSYN) 500 MG TABLET    Take 500 mg by mouth 2 (two) times daily.    OMEGA-3 FATTY ACIDS (DIALYVITE OMEGA-3 CONCENTRATE) 600 MG CAPS    Take 1 capsule by mouth daily.   PANTOPRAZOLE (PROTONIX) 40 MG TABLET    Take 1 tablet by mouth  daily   TAMSULOSIN HCL (FLOMAX) 0.4 MG CAPS    Take 0.4 mg by mouth daily.     TRAMADOL (ULTRAM) 50 MG TABLET    Take 50 mg by mouth 3 (three) times daily.   Modified Medications   No medications on file  Discontinued Medications   PANTOPRAZOLE (PROTONIX) 40 MG TABLET    Take 1 tablet (40 mg total) by mouth daily.

## 2015-01-03 NOTE — Patient Instructions (Addendum)
Today we updated your med list in our EPIC system...    Continue your current medications the same...  For your allergies>  Try alternating Claritin, Zyrtek, Allegra    And add the OTC FLUTICASONE nasal spray  Today we did your follow up CXR & FASTING blood work )and urinalysis)...    We will contact you w/ the results when available...   Keep up the good work w/ diet 7 exercise...  Call for any questions...  Let's plan a follow up visit in 64mo, sooner if needed for problems.Marland KitchenMarland Kitchen

## 2015-01-04 ENCOUNTER — Ambulatory Visit (INDEPENDENT_AMBULATORY_CARE_PROVIDER_SITE_OTHER): Payer: Medicare Other | Admitting: *Deleted

## 2015-01-04 ENCOUNTER — Telehealth: Payer: Self-pay | Admitting: Cardiology

## 2015-01-04 DIAGNOSIS — I495 Sick sinus syndrome: Secondary | ICD-10-CM | POA: Diagnosis not present

## 2015-01-04 LAB — CUP PACEART REMOTE DEVICE CHECK
Battery Impedance: 2211 Ohm
Brady Statistic AP VP Percent: 0 %
Brady Statistic AP VS Percent: 0 %
Brady Statistic AS VS Percent: 100 %
Lead Channel Pacing Threshold Amplitude: 0.625 V
Lead Channel Pacing Threshold Amplitude: 0.75 V
Lead Channel Sensing Intrinsic Amplitude: 5.6 mV
Lead Channel Setting Pacing Amplitude: 2 V
MDC IDC MSMT BATTERY REMAINING LONGEVITY: 25 mo
MDC IDC MSMT BATTERY VOLTAGE: 2.69 V
MDC IDC MSMT LEADCHNL RA IMPEDANCE VALUE: 656 Ohm
MDC IDC MSMT LEADCHNL RA PACING THRESHOLD PULSEWIDTH: 0.4 ms
MDC IDC MSMT LEADCHNL RA SENSING INTR AMPL: 0.7 mV
MDC IDC MSMT LEADCHNL RV IMPEDANCE VALUE: 760 Ohm
MDC IDC MSMT LEADCHNL RV PACING THRESHOLD PULSEWIDTH: 0.4 ms
MDC IDC SESS DTM: 20160608155319
MDC IDC SET LEADCHNL RV PACING AMPLITUDE: 2.5 V
MDC IDC SET LEADCHNL RV PACING PULSEWIDTH: 0.4 ms
MDC IDC SET LEADCHNL RV SENSING SENSITIVITY: 2 mV
MDC IDC STAT BRADY AS VP PERCENT: 0 %

## 2015-01-04 NOTE — Telephone Encounter (Signed)
Spoke with pt and reminded pt of remote transmission that is due today. Pt verbalized understanding.   

## 2015-01-04 NOTE — Progress Notes (Signed)
Remote pacemaker transmission.   

## 2015-01-10 ENCOUNTER — Emergency Department (HOSPITAL_COMMUNITY)
Admission: EM | Admit: 2015-01-10 | Discharge: 2015-01-10 | Disposition: A | Discharge status: Home / Self Care | Payer: Medicare Other | Attending: Emergency Medicine | Admitting: Emergency Medicine

## 2015-01-10 ENCOUNTER — Encounter (HOSPITAL_COMMUNITY): Payer: Self-pay | Admitting: Nurse Practitioner

## 2015-01-10 DIAGNOSIS — R55 Syncope and collapse: Secondary | ICD-10-CM | POA: Diagnosis not present

## 2015-01-10 DIAGNOSIS — Z8669 Personal history of other diseases of the nervous system and sense organs: Secondary | ICD-10-CM | POA: Insufficient documentation

## 2015-01-10 DIAGNOSIS — M1990 Unspecified osteoarthritis, unspecified site: Secondary | ICD-10-CM | POA: Insufficient documentation

## 2015-01-10 DIAGNOSIS — K219 Gastro-esophageal reflux disease without esophagitis: Secondary | ICD-10-CM | POA: Diagnosis not present

## 2015-01-10 DIAGNOSIS — I10 Essential (primary) hypertension: Secondary | ICD-10-CM | POA: Insufficient documentation

## 2015-01-10 DIAGNOSIS — Z87448 Personal history of other diseases of urinary system: Secondary | ICD-10-CM | POA: Diagnosis not present

## 2015-01-10 DIAGNOSIS — R404 Transient alteration of awareness: Secondary | ICD-10-CM | POA: Diagnosis not present

## 2015-01-10 DIAGNOSIS — Z7982 Long term (current) use of aspirin: Secondary | ICD-10-CM | POA: Insufficient documentation

## 2015-01-10 DIAGNOSIS — Z950 Presence of cardiac pacemaker: Secondary | ICD-10-CM | POA: Diagnosis not present

## 2015-01-10 DIAGNOSIS — Z79899 Other long term (current) drug therapy: Secondary | ICD-10-CM | POA: Insufficient documentation

## 2015-01-10 LAB — CBC
HCT: 38.2 % — ABNORMAL LOW (ref 39.0–52.0)
Hemoglobin: 12.7 g/dL — ABNORMAL LOW (ref 13.0–17.0)
MCH: 30.8 pg (ref 26.0–34.0)
MCHC: 33.2 g/dL (ref 30.0–36.0)
MCV: 92.5 fL (ref 78.0–100.0)
PLATELETS: 229 10*3/uL (ref 150–400)
RBC: 4.13 MIL/uL — AB (ref 4.22–5.81)
RDW: 14.1 % (ref 11.5–15.5)
WBC: 8.4 10*3/uL (ref 4.0–10.5)

## 2015-01-10 LAB — I-STAT CHEM 8, ED
BUN: 18 mg/dL (ref 6–20)
Calcium, Ion: 1.05 mmol/L — ABNORMAL LOW (ref 1.13–1.30)
Chloride: 104 mmol/L (ref 101–111)
Creatinine, Ser: 1 mg/dL (ref 0.61–1.24)
Glucose, Bld: 97 mg/dL (ref 65–99)
HCT: 40 % (ref 39.0–52.0)
Hemoglobin: 13.6 g/dL (ref 13.0–17.0)
Potassium: 4.2 mmol/L (ref 3.5–5.1)
SODIUM: 136 mmol/L (ref 135–145)
TCO2: 21 mmol/L (ref 0–100)

## 2015-01-10 LAB — TROPONIN I

## 2015-01-10 NOTE — ED Notes (Signed)
Pt medtronic pacemaker interrogated and transmitted waiting for report.

## 2015-01-10 NOTE — ED Provider Notes (Signed)
CSN: [id number]     Arrival date & time 01/10/15  1448 History   First MD Initiated Contact with Patient 01/10/15 1453     Chief Complaint  Patient presents with  . Near Syncope     (Consider location/radiation/quality/duration/timing/severity/associated sxs/prior Treatment) HPI Was at lunch and became very sweaty and pale. He reports this happened before he had actually gotten a chance to eat. He tried to go in the bathroom and put cool water on his face to relieve the symptoms. That didn't help much, on the way back from the restroom he had to sit down at the table. He did not have a complete loss of consciousness. There was no associated fall or injury. No associated chest pain or shortness of breath. The patient reports he thinks he got dehydrated from working outside most of the morning. He also states that he has had a history of vasovagal episodes to such a degree of severity that he actually got a pacemaker placed for them. Reportedly with tilt table testing he had extreme bradycardia and ended up getting a pacemaker with no coronary ischemic disease history. He reports he's had very frequent episodes of similar type of syncope\near syncope episodes. He reports being diagnosed with vasovagal syncope. Past Medical History  Diagnosis Date  . Obstructive sleep apnea   . hypertension   . Syncope and collapse   . Pacemaker-MDT     Change out 2008  . Other abnormal glucose   . Overweight(278.02)   . Esophageal reflux   . Diverticulosis of colon (without mention of hemorrhage)   . Irritable bowel syndrome   . Hypertrophy of prostate with urinary obstruction and other lower urinary tract symptoms (LUTS)   . Osteoarthrosis, unspecified whether generalized or localized, unspecified site   . Backache, unspecified   . Unspecified hereditary and idiopathic peripheral neuropathy   . Anal fissure   . Allergy    Past Surgical History  Procedure Laterality Date  . Appendectomy    .  Cholecystectomy    . Tonsillectomy    . Pacemaker placement    . Skin cancer removed  12/2012    removed from his face--Dr. Sarajane Jews   Family History  Problem Relation Age of Onset  . Coronary artery disease Father     bladder cancer  . Coronary artery disease Mother   . Diabetes Sister     2 sisters   History  Substance Use Topics  . Smoking status: Never Smoker   . Smokeless tobacco: Never Used  . Alcohol Use: 0.0 oz/week    0 Standard drinks or equivalent per week     Comment: occasionally     Review of Systems 10 Systems reviewed and are negative for acute change except as noted in the HPI.   Allergies  Shellfish allergy  Home Medications   Prior to Admission medications   Medication Sig Start Date End Date Taking? Authorizing Provider  acetaminophen (TYLENOL) 500 MG tablet Take 1,000 mg by mouth every 6 (six) hours as needed for pain.   Yes Historical Provider, MD  amLODipine (NORVASC) 5 MG tablet Take 1 tablet by mouth  daily 08/12/14  Yes [name], MD  aspirin 81 MG tablet Take 81 mg by mouth daily.   Yes Historical Provider, MD  calcium carbonate (OS-CAL) 600 MG TABS Take 600 mg by mouth daily.     Yes Historical Provider, MD  Cholecalciferol (VITAMIN D) 1000 UNITS capsule Take 1,000 Units by mouth daily.  Yes Historical Provider, MD  finasteride (PROSCAR) 5 MG tablet Take 1 tablet by mouth daily. 08/13/11  Yes Historical Provider, MD  Lecithin 1200 MG CAPS Take 1 capsule by mouth daily.     Yes Historical Provider, MD  loratadine (CLARITIN) 10 MG tablet Take 10 mg by mouth daily.     Yes Historical Provider, MD  losartan (COZAAR) 100 MG tablet Take 1 tablet by mouth  daily 10/12/14  Yes [name], MD  Multiple Vitamin (MULTIVITAMIN) capsule Take 1 capsule by mouth daily.     Yes Historical Provider, MD  naproxen (NAPROSYN) 500 MG tablet Take 500 mg by mouth 2 (two) times daily.  05/26/14  Yes Historical Provider, MD  Omega-3 Fatty Acids (DIALYVITE OMEGA-3  CONCENTRATE) 600 MG CAPS Take 1 capsule by mouth daily.   Yes Historical Provider, MD  Tamsulosin HCl (FLOMAX) 0.4 MG CAPS Take 0.4 mg by mouth daily.     Yes Historical Provider, MD  traMADol (ULTRAM) 50 MG tablet Take 50 mg by mouth 3 (three) times daily.  05/26/14  Yes Historical Provider, MD  pantoprazole (PROTONIX) 40 MG tablet Take 1 tablet by mouth  daily Patient not taking: Reported on 01/10/2015 08/12/14   [name], MD   BP 130/77 mmHg  Pulse 71  Temp(Src) 97.8 F (36.6 C) (Oral)  Resp 14  Ht 5\' 9"  (1.753 m)  Wt 192 lb (87.091 kg)  BMI 28.34 kg/m2  SpO2 98% Physical Exam  Constitutional: He is oriented to person, place, and time. He appears well-developed and well-nourished.  HENT:  Head: Normocephalic and atraumatic.  Mouth/Throat: Oropharynx is clear and moist.  Eyes: EOM are normal. Pupils are equal, round, and reactive to light.  Neck: Neck supple. No thyromegaly present.  Cardiovascular: Normal rate, regular rhythm, normal heart sounds and intact distal pulses.   Pulmonary/Chest: Effort normal and breath sounds normal.  Abdominal: Soft. Bowel sounds are normal. He exhibits no distension. There is no tenderness.  Musculoskeletal: Normal range of motion. He exhibits no edema.  Neurological: He is alert and oriented to person, place, and time. He has normal strength. Coordination normal. GCS eye subscore is 4. GCS verbal subscore is 5. GCS motor subscore is 6.  Skin: Skin is warm, dry and intact.  Psychiatric: He has a normal mood and affect.    ED Course  Procedures (including critical care time) Labs Review Labs Reviewed  CBC - Abnormal; Notable for the following:    RBC 4.13 (*)    Hemoglobin 12.7 (*)    HCT 38.2 (*)    All other components within normal limits  I-STAT CHEM 8, ED - Abnormal; Notable for the following:    Calcium, Ion 1.05 (*)    All other components within normal limits  TROPONIN I    Imaging Review No results found.   EKG  Interpretation   Date/Time:  Tuesday January 10 2015 14:56:03 EDT Ventricular Rate:  71 PR Interval:  157 QRS Duration: 139 QT Interval:  413 QTC Calculation: 449 R Axis:   41 Text Interpretation:  Sinus rhythm Right bundle branch block agree. change  from old Confirmed by Johnney Killian, MD, Jeannie Done [phone]) on 01/10/2015 4:31:05 PM      MDM   Final diagnoses:  Near syncope   The patient describes a long history of similar type of episodes. He does have a pacemaker placed specifically for a tendency of vasovagal type bradycardia. Pacer interrogation does not show that he had any events that  required intervention of pacing. Pacer shown to be functioning normally. Patient did not have other ischemic type symptoms of chest pain or dyspnea. The lightheadedness and diaphoresis that he experienced he describes as typical for these events. He has otherwise been healthy with no positives on review of systems. The patient is asymptomatic in the emergency department. At this time he will be discharged with instructions for follow-up with his family doctor and cardiology.    Charlesetta Shanks, MD 01/10/15 [phone]

## 2015-01-10 NOTE — Discharge Instructions (Signed)
Near-Syncope Near-syncope (commonly known as near fainting) is sudden weakness, dizziness, or feeling like you might pass out. During an episode of near-syncope, you may also develop pale skin, have tunnel vision, or feel sick to your stomach (nauseous). Near-syncope may occur when getting up after sitting or while standing for a long time. It is caused by a sudden decrease in blood flow to the brain. This decrease can result from various causes or triggers, most of which are not serious. However, because near-syncope can sometimes be a sign of something serious, a medical evaluation is required. The specific cause is often not determined. HOME CARE INSTRUCTIONS  Monitor your condition for any changes. The following actions may help to alleviate any discomfort you are experiencing:  Have someone stay with you until you feel stable.  Lie down right away and prop your feet up if you start feeling like you might faint. Breathe deeply and steadily. Wait until all the symptoms have passed. Most of these episodes last only a few minutes. You may feel tired for several hours.   Drink enough fluids to keep your urine clear or pale yellow.   If you are taking blood pressure or heart medicine, get up slowly when seated or lying down. Take several minutes to sit and then stand. This can reduce dizziness.  Follow up with your health care provider as directed. SEEK IMMEDIATE MEDICAL CARE IF:   You have a severe headache.   You have unusual pain in the chest, abdomen, or back.   You are bleeding from the mouth or rectum, or you have black or tarry stool.   You have an irregular or very fast heartbeat.   You have repeated fainting or have seizure-like jerking during an episode.   You faint when sitting or lying down.   You have confusion.   You have difficulty walking.   You have severe weakness.   You have vision problems.  MAKE SURE YOU:   Understand these instructions.  Will  watch your condition.  Will get help right away if you are not doing well or get worse. Document Released: 07/15/2005 Document Revised: 07/20/2013 Document Reviewed: 12/18/2012 [employer] Patient Information 2015 [employer]. This information is not intended to replace advice given to you by your health care provider. Make sure you discuss any questions you have with your health care provider.  

## 2015-01-10 NOTE — ED Notes (Signed)
Per EMS pt from home had near syncopal episode, was diaphoretic, pale blurry vision, lightheaded, no radial pulses initally on scene. Pt placed in trendelenberg and given 500cc NS bolus. Pt radial pulses now present. Pt alert and oriented, sts "I feel fine. Denies pain.

## 2015-01-18 ENCOUNTER — Encounter: Payer: Self-pay | Admitting: Cardiology

## 2015-01-23 ENCOUNTER — Encounter: Payer: Self-pay | Admitting: Internal Medicine

## 2015-02-01 ENCOUNTER — Encounter: Payer: Self-pay | Admitting: Cardiology

## 2015-02-06 ENCOUNTER — Other Ambulatory Visit: Payer: Self-pay | Admitting: Pulmonary Disease

## 2015-02-16 ENCOUNTER — Telehealth: Payer: Self-pay | Admitting: Pulmonary Disease

## 2015-02-16 DIAGNOSIS — T63441A Toxic effect of venom of bees, accidental (unintentional), initial encounter: Secondary | ICD-10-CM

## 2015-02-16 NOTE — Telephone Encounter (Signed)
Per SN,  Please refer to Dr Donneta Romberg with [facility] at [facility] for allergy testing  Referral was placed and pt contacted about the referrral Nothing further is needed.

## 2015-02-16 NOTE — Telephone Encounter (Signed)
Patient states that he has been stung by yellow jackets on 2 separate occassions in the last 2 weeks.  The first occurrence was 2 weeks ago he was stung several times in his legs - no long term effects from stings.  The second occurrence was 1 week ago he was stung 3 times in his arms. Pt reports that this time the itching and redness was more severe and lasted a longer period of time and seemed to take longer to clear up. Patient wife is concerned with possible development of allergy to the bee venom. States that their Son was stung recently and went into anaphylactic shock and had to be taken to ED. Patient would like to know if he needs to be cautious and carry an Epipen around with him.   Please advise [name]. Thanks.

## 2015-03-31 DIAGNOSIS — N400 Enlarged prostate without lower urinary tract symptoms: Secondary | ICD-10-CM | POA: Diagnosis not present

## 2015-04-06 ENCOUNTER — Encounter: Payer: Self-pay | Admitting: Internal Medicine

## 2015-04-06 ENCOUNTER — Ambulatory Visit (INDEPENDENT_AMBULATORY_CARE_PROVIDER_SITE_OTHER): Payer: Medicare Other | Admitting: *Deleted

## 2015-04-06 DIAGNOSIS — I495 Sick sinus syndrome: Secondary | ICD-10-CM

## 2015-04-06 NOTE — Progress Notes (Signed)
Remote pacemaker transmission.   

## 2015-04-19 LAB — CUP PACEART REMOTE DEVICE CHECK
Brady Statistic AP VP Percent: 0 %
Brady Statistic AP VS Percent: 0 %
Brady Statistic AS VP Percent: 0 %
Brady Statistic AS VS Percent: 100 %
Lead Channel Impedance Value: 733 Ohm
Lead Channel Pacing Threshold Amplitude: 0.625 V
Lead Channel Pacing Threshold Amplitude: 0.75 V
Lead Channel Pacing Threshold Pulse Width: 0.4 ms
Lead Channel Sensing Intrinsic Amplitude: 5.6 mV
Lead Channel Setting Pacing Pulse Width: 0.4 ms
MDC IDC MSMT BATTERY IMPEDANCE: 2666 Ohm
MDC IDC MSMT BATTERY REMAINING LONGEVITY: 21 mo
MDC IDC MSMT BATTERY VOLTAGE: 2.7 V
MDC IDC MSMT LEADCHNL RA IMPEDANCE VALUE: 691 Ohm
MDC IDC MSMT LEADCHNL RA SENSING INTR AMPL: 0.7 mV
MDC IDC MSMT LEADCHNL RV PACING THRESHOLD PULSEWIDTH: 0.4 ms
MDC IDC SESS DTM: 20160908011643
MDC IDC SET LEADCHNL RA PACING AMPLITUDE: 2 V
MDC IDC SET LEADCHNL RV PACING AMPLITUDE: 2.5 V
MDC IDC SET LEADCHNL RV SENSING SENSITIVITY: 2 mV

## 2015-04-24 DIAGNOSIS — H40013 Open angle with borderline findings, low risk, bilateral: Secondary | ICD-10-CM | POA: Diagnosis not present

## 2015-04-28 ENCOUNTER — Telehealth: Payer: Self-pay | Admitting: Pulmonary Disease

## 2015-04-28 DIAGNOSIS — H9190 Unspecified hearing loss, unspecified ear: Secondary | ICD-10-CM

## 2015-04-28 NOTE — Telephone Encounter (Signed)
Called spoke with pt. He is requesting a referral to [facility]-hearing dept. In order for him to be tested for hearing loss. Please advise SN thanks Fax # [phone]

## 2015-04-28 NOTE — Telephone Encounter (Signed)
LMTCB

## 2015-04-28 NOTE — Telephone Encounter (Signed)
[phone], pt cb

## 2015-04-29 DIAGNOSIS — Z23 Encounter for immunization: Secondary | ICD-10-CM | POA: Diagnosis not present

## 2015-05-02 NOTE — Telephone Encounter (Signed)
Per SN, Ok to refer

## 2015-05-02 NOTE — Telephone Encounter (Signed)
Referral has been placed. Pt's wife is aware. Nothing further was needed.

## 2015-05-02 NOTE — Telephone Encounter (Signed)
SN please advise. Thanks.  

## 2015-05-10 ENCOUNTER — Encounter: Payer: Self-pay | Admitting: Cardiology

## 2015-05-22 ENCOUNTER — Encounter: Payer: Self-pay | Admitting: Internal Medicine

## 2015-06-02 DIAGNOSIS — G4733 Obstructive sleep apnea (adult) (pediatric): Secondary | ICD-10-CM | POA: Diagnosis not present

## 2015-06-06 DIAGNOSIS — J3489 Other specified disorders of nose and nasal sinuses: Secondary | ICD-10-CM | POA: Diagnosis not present

## 2015-06-06 DIAGNOSIS — G4733 Obstructive sleep apnea (adult) (pediatric): Secondary | ICD-10-CM | POA: Diagnosis not present

## 2015-06-06 DIAGNOSIS — R430 Anosmia: Secondary | ICD-10-CM | POA: Diagnosis not present

## 2015-06-06 DIAGNOSIS — Z9989 Dependence on other enabling machines and devices: Secondary | ICD-10-CM | POA: Diagnosis not present

## 2015-06-06 DIAGNOSIS — R0981 Nasal congestion: Secondary | ICD-10-CM | POA: Diagnosis not present

## 2015-06-12 ENCOUNTER — Other Ambulatory Visit: Payer: Self-pay | Admitting: Pulmonary Disease

## 2015-06-20 DIAGNOSIS — L720 Epidermal cyst: Secondary | ICD-10-CM | POA: Diagnosis not present

## 2015-07-03 DIAGNOSIS — L720 Epidermal cyst: Secondary | ICD-10-CM | POA: Diagnosis not present

## 2015-07-04 ENCOUNTER — Encounter: Payer: Self-pay | Admitting: Pulmonary Disease

## 2015-07-04 ENCOUNTER — Ambulatory Visit (INDEPENDENT_AMBULATORY_CARE_PROVIDER_SITE_OTHER): Payer: Medicare Other | Admitting: Pulmonary Disease

## 2015-07-04 VITALS — BP 118/72 | HR 84 | Temp 98.0°F | Wt 205.0 lb

## 2015-07-04 DIAGNOSIS — G609 Hereditary and idiopathic neuropathy, unspecified: Secondary | ICD-10-CM

## 2015-07-04 DIAGNOSIS — Z950 Presence of cardiac pacemaker: Secondary | ICD-10-CM

## 2015-07-04 DIAGNOSIS — K219 Gastro-esophageal reflux disease without esophagitis: Secondary | ICD-10-CM

## 2015-07-04 DIAGNOSIS — M150 Primary generalized (osteo)arthritis: Secondary | ICD-10-CM

## 2015-07-04 DIAGNOSIS — K5730 Diverticulosis of large intestine without perforation or abscess without bleeding: Secondary | ICD-10-CM

## 2015-07-04 DIAGNOSIS — I495 Sick sinus syndrome: Secondary | ICD-10-CM

## 2015-07-04 DIAGNOSIS — R7301 Impaired fasting glucose: Secondary | ICD-10-CM

## 2015-07-04 DIAGNOSIS — I10 Essential (primary) hypertension: Secondary | ICD-10-CM

## 2015-07-04 DIAGNOSIS — M159 Polyosteoarthritis, unspecified: Secondary | ICD-10-CM

## 2015-07-04 DIAGNOSIS — N138 Other obstructive and reflux uropathy: Secondary | ICD-10-CM

## 2015-07-04 DIAGNOSIS — K588 Other irritable bowel syndrome: Secondary | ICD-10-CM

## 2015-07-04 DIAGNOSIS — N401 Enlarged prostate with lower urinary tract symptoms: Secondary | ICD-10-CM

## 2015-07-04 MED ORDER — LOSARTAN POTASSIUM 100 MG PO TABS: ORAL_TABLET | ORAL | Status: DC

## 2015-07-04 MED ORDER — NAPROXEN 500 MG PO TABS: 500.0000 mg | ORAL_TABLET | Freq: Two times a day (BID) | ORAL | Status: DC

## 2015-07-04 MED ORDER — PANTOPRAZOLE SODIUM 40 MG PO TBEC: DELAYED_RELEASE_TABLET | ORAL | Status: DC

## 2015-07-04 MED ORDER — AMLODIPINE BESYLATE 5 MG PO TABS: ORAL_TABLET | ORAL | Status: DC

## 2015-07-04 MED ORDER — TRAMADOL HCL 50 MG PO TABS: 50.0000 mg | ORAL_TABLET | Freq: Three times a day (TID) | ORAL | Status: DC

## 2015-07-04 NOTE — Patient Instructions (Signed)
Today we updated your med list in our EPIC system...    Continue your current medications the same...    We refilled your meds per request...  Let's get on track w/ our diet & exercise program...  Call for any questions...  Let's plan a follow up visit in 36mo, sooner if needed for problems.Marland KitchenMarland Kitchen

## 2015-07-04 NOTE — Progress Notes (Signed)
Subjective:    Patient ID: Tony Cruz, male    DOB: 1933-09-04, 81 y.o.   MRN: [id number]  HPI 81 y/o WM here for a follow up visit... he has multiple medical problems as noted below...  ~  SEE PREV EPIC NOTES FOR OLDER DATA >>    LABS 7/14:  FLP- ok x TG=190 on diet alone;  Chems- ok x BS=114 A1c=6.6;  CBC- wnl;  TSH=1.27;  PSA=0.89;  Uric=5.9.Marland KitchenMarland Kitchen  ~  December 28, 2013:  67yr [name] states he had a "crummy year"> notes that he fell on the ice 2/15 & hit his head, went to the ER w/ skull fx & subdural hematoma;  He has seen DrAquino for [facility], DrNudelman w/ conservative management but he developed residual HA, dizziness, and BPPV- ultimately sent to DrShoemaker w/ Epley maneuvers & finally improved...  He has also had BP issues w/ pressure in the 150/90 range so we called in Amlodipine 5mg /d- now BP improved to the 130/80 range...  We reviewed the following medical problems during today's office visit >>     OSA> his eval & initial therapy was done by the [place]; followed by DrClance w/ good compliance & no issues w/ sleep or daytime hypersomnolence...     HBP> on ASA81, Cozaar100 & Amlod5; BP= 132/88 & he denies CP, palpit, SOB, edema; DrKlein stopped his Hct due to dizziness......    HxSyncope, sinus node dysfunction, Pacer> followed by DrKlein, pacer function normally, no issues ident...    BorderlineDM> on diet alone; Labs 6/15 showed BS=121, A1c=6.4; we reviewed diet, exercise, wt reduction...     Overweight> weight= 218# and BMI= 33... We reviewed diet, exercise, wt reduction strategies...    GI- GERD, Barrett's, Divertics, IBS> on Prilosec20; known [place], HxBarrett's, prev Rx by DrWeissman & now DrPyrtle at [facility] w/ EGD 11/14 showing gastritis, esophagitis, gastric polyps (path=+Barretts, reflux related injury, neg HPylori); colonoscopy 11/14 showing mod divertics, & 51mm polyp in cecum (tub adenoma)...    GU- BPH> on Flomax0.4 & Proscar5; followed by DrRDavis=> DrEskridge    DJD,  LBP> on Naprosyn, Tramadol; c/o hands and ankles stiff but he still plays golf; eval by Kirby Funk & he released him "I've done all I can do for you"    Fall 2/15 w/ nondisplaced skull fx and subdural hematoma> treated conservatively, he had some resid HA, dizzy, etc & treated w/ Epley maneuvers- improved...    Periph neuropathy> thorough eval 2011 by DrReynolds & Hickling- idiopathic; not on specific therapy for this...    Skin cancer> he had a squamous cell removed from his left cheek 5/14 by DrDJones... We reviewed prob list, meds, xrays and labs> see below for updates >> we gave him the PREVNAR-13 vaccination today...  CXR 6/15 showed normal heart size, mild tortuous Ao, pacer, clear lungs, NAD...  LABS 6/15:  FLP- wnl on diet;  Chems- wnl x BS=121, A1c=6.4;  CBC- wnl;  TSH=1.73;  VitD=49...   ~  July 01, 2014:  66mo [name] brought a list of problems to discuss today> He notes decreased smell and taste, exam is neg and we discussed trial of Rx w/ Antihist, Flonase, nasal saline;  He has lost 16# on diet & exercise down to 202# (BMI=30.7), he is encouraged to keep up the good work & get weight down further;  He is c/o some neuropathy discomfort in feet but no real pain, he is encouraged to continue wt reduction, low carb diet, use Tylenol prn;  He notes arthritis in his hands & he has seen DrAnderson in the past who rec aleve prn ("he could help me if I had Rheumatoid arthritis")...     HBP> on ASA81, Cozaar100 & Amlod5; BP= 120/84 & he denies CP, palpit, SOB, edema; DrKlein prev stopped his Hct due to dizziness...Marland KitchenMarland KitchenMarland Kitchen    BorderlineDM> on diet alone; weight is down 16# to 202# today; Labs 6/15 showed BS=121, A1c=6.4; we reviewed diet, exercise, wt reduction...     GI- GERD, Barrett's, Divertics, IBS> on Pantoprazole40; known [place], HxBarrett's, prev Rx by DrWeissman & now DrPyrtle at [facility] w/ EGD 11/14 showing gastritis, esophagitis, gastric polyps (path=+Barretts, reflux related injury, neg  HPylori); colonoscopy 11/14 showing mod divertics, & 66mm polyp in cecum (tub adenoma)...    GU- BPH> on Flomax0.4 & Proscar5; followed by DrRDavis=> DrEskridge...    DJD, LBP> on Naprosyn, Tylenol; c/o hands and ankles stiff but he still plays golf; eval by Kirby Funk & he released him "I've done all I can do for you"    Fall 2/15 w/ nondisplaced skull fx and subdural hematoma> treated conservatively, he had some resid HA, dizzy, etc & treated w/ Epley maneuvers- improved...    Periph neuropathy> thorough eval 2011 by DrReynolds & Hickling- idiopathic; min discomfort & not on specific therapy for this... We reviewed prob list, meds, xrays and labs> see below for updates >>   ~  January 03, 2015:  57mo ROV & Tony Cruz reports stable- "just aging" he says;  He brought a list of complaints/ questions today:  They are disrupted at home w/ kitchen remodel; notes his urine is darker than usual (UA is clear- asked to incr water intake); taking Naprosyn & worried about renal (Cr=0.89 & reassured); +FamHx DM & he wants another A1c (BS=114, A1c=6.0);  Neuropathy eval by DrAquino- reviewed w/ pt- not a lot of pain, mild burning, friend takes Gabapentin; disturbance of taste & worried about B12, Zinc, Copper (B12=594); leg cramps in thigh area- trying mustard, Tumeric; athritis pains- stiff, swelling, esp in hands... We reviewed the following medical problems during today's office visit >>     AR> on Claritin & Flonase added for "swelling" sensation... Note 10% eos on CBC...    OSA> followed by DrClance, stable on CPAP, continue same...    HBP> on ASA81, Cozaar100 & Amlod5; BP= 142/78 & he denies CP, palpit, SOB, edema; DrKlein prev stopped his Hct due to dizziness......    HxSyncope, sinus node dysfunction, Pacer> followed by DrKlein, pacer function normally, no issues ident...    BorderlineDM, overweight> on diet alone; weight is down 3# to 198# today; Labs 6/16 showed BS=114, A1c=6.0; we reviewed diet, exercise, wt  reduction...     GI- GERD, Barrett's, Divertics, IBS> on Pantoprazole40; known [place], HxBarrett's, prev Rx by DrWeissman & now DrPyrtle at [facility] w/ EGD 11/14 showing gastritis, esophagitis, gastric polyps (path=+Barretts, reflux related injury, neg HPylori); colonoscopy 11/14 showing mod divertics, & 23mm polyp in cecum (tub adenoma)...    GU- BPH> on Flomax0.4 & Proscar5; followed by DrRDavis=> DrEskridge... Asked to take meds regularly.    DJD, LBP> on Naprosyn, Tramadol, Tylenol; c/o hands and ankles stiff but he still plays golf; eval by Kirby Funk & he released him "I've done all I can do for you"    Fall 2/15 w/ nondisplaced skull fx and subdural hematoma> treated conservatively, he had some resid HA, dizzy, etc & treated w/ Epley maneuvers- improved...    Periph neuropathy> thorough eval 2011 by DrReynolds & Hickling-  idiopathic; 2nd opinion DrAquino- min discomfort & not on specific therapy for this...    Anxious> aware, not on meds... We reviewed prob list, meds, xrays and labs> see below for updates >>   CXR 6/16 showed norm heart size, pacer, clear lungs, NAD...  LABS 6/16:  FLP- at goals on diet;  Chems- ok x BS=114, A1c=6.0;  CBC- wnl x 10%eos;  TSH=1.23;  B12=594;  UA- clear, neg protein.... PLAN>>  All questions answered, stable on current med regimen; Flonase added, call for problems...  ~  July 04, 2015:  10mo [place] reports a good interval- feeling well & no new complaints or concerns... He requested refer to [facility] for hearing eval & allergy referral to [place] for testing due to jellow jacket stings...    Hx OSA on CPAP prev followed by [facility]; gets mask & tubing changed Q66mo via [employer], rests well 6-8H/night, using CPAP every night, wakes rested & no daytime sleep pressure or issues w/ alertness...    BP controlled on Amlod5 & Losar100, measures 118/72 today & he denies HA, CP, palpit, SOB, edema, etc; pacer checked by DrKlein & stable...     Weight = 205# which is up 7#  from last OV 7 we reviewed diet, exercise, wt reducing strategies...     GI & GU stable on Protonix40 & Proscar5+Flomax0.4.Marland KitchenMarland Kitchen    He has DJD, LBP, and fell 2/15 w/ skull fx & subdural, neuropathy> he saw DrNudelman w/ rec for Aleve and exercise! Kirby Funk is retiring- on [employer] 7 notes that he hurts everyday.    Derm- DrDJones removed an inflamed cyst from his back... EXAM shows Afeb, VSS, O2sat=97% on RA;  HEENT- neg, mallampati2;  Chest- clear w/o w/r/r;  Heart- RR w/o m/r/g;  Abd-  Soft, nontender, neg;  Ext- neg w/o c/c/e;  Neuro- neuropathy, no other focal abn... IMP/PLAN>>  Void is stable, continue same meds, we plan ROV in 40mo...            Problem List:   OBSTRUCTIVE SLEEP APNEA (ICD-327.23) - evaluation, diagnosis, and Rx by the [facility] (their notes scanned into the EMR):  he had PSG 4/08 w/ AHI= 26, desat to 83%... placed on CPAP at 11cm H20 pressure... he also has some RLS & was on Requip transiently from the [place]. ~  4/11: pt indicates that he uses his CPAP 4/7 nights for 6-7 hr/night; he feels that he rests well, denies daytime hypersom, snoring, etc... he requests Sleep consult here in [place] (see DrClance consult). ~  5/13: he saw DrClance for Sleep f/u> noted 10# wt gain, pt reported using CPAP compliantly w/ good daytime alertness, no issues w/ mask but requesting new machine ([employer]) as his is >43yrs old; requested to work on wt reduction... ~  6/15: his eval & initial therapy was done by the [facility]; followed by DrClance w/ good compliance & no issues w/ sleep or daytime hypersomnolence.  ALLERGIC RHINITIS (ICD-477.9) - controlled on Claritin, and OTC meds... ~  12/15: we discussed trial of Antihist, Flonase, nasal saline to help his smell & taste...   HYPERTENSION (ICD-401.9) >>  ~  on ASA 81mg /d & HCTZ 25mg /d...  ~  CXR 4/11 is clear and WNL, NAD... ~  6/12:  BP=122/82 today and he denies HA, fatigue, visual changes, CP, palipit, dizziness, syncope, dyspnea, edema, etc... ~  6/13:   BP= 142/98 & he admits intermittently elev at home; we discussed change med to LOSARTAN/ HCT 100/12.5 daily; he will monitor  BP & f/u here... ~  11/13:  BP= 132/88 & similar at home; denies CP, palpit, SOB, edema... ~  6/14:  BP= 126/84 & he remains asymptomatic... ~  6/15:  on ASA81, Cozaar100 & Amlod5; BP= 132/88 & he denies CP, palpit, SOB, edema; DrKlein stopped his Hct due to dizziness. ~  12/15: on ASA81, Cozaar100 & Amlod5; BP= 120/84 & he remains essentially asymptomatic...  Hx of SYNCOPE (ICD-780.2) & CARDIAC PACEMAKER IN SITU (ICD-V45.01) - he had pacer changed 11/08 DrKlein (hosp DC Summary reviewed)... he continues regular pacer checks and f/u w/ DrKlein...  ~  cath 3/99 DrJoeLeB showed norm coronaries, norm LV, norm Ao... ~  He saw DrKlein 12/11 for f/u syncope secondary to sinus node dysfunction, treated w/ pacer & generator replaced 2008;  He noted intercurrent syncope that was felt to be vasovagal in origin (this occurred in the Cards clinic 8/11 while waiting for his wife, & he has a long hx of this from blood draws over the yrs) & DrKlein reprogramed his device; EKG w/ SBrady & otherw WNL... ~  He saw DrKlein for Cards/EP f/u 12/12> hx syncope related to [facility] dysfunction- devise working properly & no changes made to the programming; TSH & sed rate wetre checked and WNL... ~  Yearly f/u DrKlein 12/13> doing satis w/o intercurrent syncope, pacer working satis & f/u planned 37yr... ~  12/14: he had yearly ROV w/ DrKlein, doing satis & pacer OK, no changes made...  DIABETES MELLITUS, BORDERLINE (ICD-790.29) - he's been concerned about his BS due to [place] and his neuropathy symptoms, but his BS has only been borderline elvated prev...  ~  labs 4/10 (wt=209#) showed BS= 113, A1c= 6.3.Marland Kitchen. rec> better diet, get wt down. ~  labs 4/11 (wt=222#) showed BS= 109, A1c= 6.2.Marland Kitchen. no meds yet, just needs to get wt down. ~  Labs 6/12 (wt=204#) showed BS= 102, A1v= 6.4 ~  Labs 6/13 (wt=213#) showed  BS= 105, A1c= 6.4 ~  Labs 6/14 showed BS= 114, A1c= 6.6 ~  Labs 6/15 showed BS= 121, A1c= 6.4  OVERWEIGHT (ICD-278.02) - we discussed diet + exercise therapy (again)... ~  weight 4/09 = 226#.Marland KitchenMarland Kitchen 5" 9" Tall for a BMI= 33-34 ~  weight 4/10 = 209#... BMI down to 31... keep up the good work. ~  weight 4/11 = 222# .Marland KitchenMarland Kitchen BMI up to 33. ~  Weight 6/12 = 204# ~  Weight 6/13 = 213# ~  Weight 6/14 = 214# ~  Weight 6/15 = 218# ~  Weight 12/15 = 202# keep up the good work...  GASTROESOPHAGEAL REFLUX DISEASE (ICD-530.81) w/ BARRETT'S ESOPH - followed by DrWeissman & Rx w/ OMEPRAZOLE 20mg Bid... ~  prev EGD's documented a [place] & Barrett's esoph... (last 1/03 by DrPatterson). ~  last EGD was 2/08 showing Barrett's epithelium in lower 1/3 of esoph... f/u planned 48yrs. ~  4/11:  NOTE> f/u EGD is due, he will decide on gastroenterologist ==> he saw DrMJohnson w/ EGD 5/11 showing Irreg Z-line & bx c/w Barrett's esoph (no dysplasia), the rest of the esoph, stomach, & duodenum were WNL... ~  EGD 11/14 by DrPyrtle showed gastritis, esophagitis, gastric polyps (path=+Barretts, reflux related injury, neg HPylori)...  DIVERTICULOSIS OF COLON (ICD-562.10)  IRRITABLE BOWEL SYNDROME (ICD-564.1) ~  last colonoscopy 2/08 by DrWeissman showed left sided divertics, otherw neg... f/u planned 23yrs... ~  Colonoscopy 11/14 by DrPyrtle showed mod divertics, & 34mm polyp in cecum (tub adenoma)...  BENIGN PROSTATIC HYPERTROPHY, WITH OBSTRUCTION (ICD-600.01) - on FLOMAX 0.4mg /d &  PROSCAR 5mg /d... eval and Rx by [facility] who checks pt q26mo (we don't have notes from him)... pt tells me he will be seeing DrDavis regularly when he moves to [facility]. ~  9/13: pt reports that he is now seeing DrEskridge at [facility] & his PSAs have all been normal; his major prob has been holding his urine & eval revealed not emptying completely- they have tried several meds to help this; he will have notes sent to [place] for [facility] records.  OSTEOARTHRITIS  (ICD-715.90) >> BACK PAIN (ICD-724.5) >>  ~  managed by DrAnderson & difficult problem, thought to have an inflamm arthropathy (?variation of psoriatic arthritis) however he did not respond to NSAIDs/Celebrex, Pred, MTX, anti-TNF inhibitor; on Tramadol prn;  ~  pt saw DrBeane 6/10 for eval> neuropathy symptoms & lumbar spondylosis on XRays, Rx Mobic Prn... offered Myelogram for further eval... he also takes Calcium & Vit D. ~  He was evaluated by Kirby Funk for Rheum => on Naprosyn, Tylenol; c/o hands and ankles stiff but he still plays golf etc; states that they released him "I've done all I can do for you, if you had RA I could help you"  FALL w/ SUBDURAL HEMATOMA >> treated conservatively, he had some resid HA, dizzy, etc & treated w/ Epley maneuvers- improved CT Head 4/15 showed mild atrophy & sm vessel dis & scat lacunes, prev left parietal extra-axial fluid collection is resolved...   PERIPHERAL NEUROPATHY (ICD-356.9) - DrReynolds evaluated him w/ NCV's etc and dx a peripheral neuropathy... this is one of his CC> numbness, decr sensation in feet, (no pain) w/ some assoc balance problems intermittently...  he tried Mentanx but no benefit...  ~  4/11: I reviewed DrReynolds 11/10 note w/ the patient... ~  He also had a thorough neurologic evaluation 10/11 from one of his church physicians, DrHickling, for f/u polyneuropathy> balance issues, LBP, gait abn, variable paresthesias, foot numbness (sl decr vibration & pin prick), some subjective memory problems w/ norm MMSE, abnormal NCVs;  Prev Labs were all WNL, unrevealing;  etiology of his neuropathy is unknown= cryptogenic, and since it isn't painful he really didn't require any therapy... ~  6/13: he continues to experience prob w/ balance etc related to his neuropathy; not progressive & still not painful; also notes nocturnal leg cramps & he's tried bar of soap trick, mustard, etc- all w/o relief (we rec trial Tonic water)... ~  2015> Neuro follow  up by DrAquino for [facility]...  Health Maintenance - he takes a number of Vits + lecithin, saw palmetto, Fish Oil, etc... he saw DrDJones in 2010 for Derm review- pt reports nothing major found...   Past Surgical History  Procedure Laterality Date  . Appendectomy    . Cholecystectomy    . Tonsillectomy    . Pacemaker placement    . Skin cancer removed  12/2012    removed from his face--Dr. Sarajane Jews    Outpatient Encounter Prescriptions as of 07/04/2015  Medication Sig  . acetaminophen (TYLENOL) 500 MG tablet Take 1,000 mg by mouth every 6 (six) hours as needed for pain.  Marland Kitchen amLODipine (NORVASC) 5 MG tablet Take 1 tablet by mouth  daily  . aspirin 81 MG tablet Take 81 mg by mouth daily.  . calcium carbonate (OS-CAL) 600 MG TABS Take 600 mg by mouth daily.    . Cholecalciferol (VITAMIN D) 1000 UNITS capsule Take 1,000 Units by mouth daily.    . finasteride (PROSCAR) 5 MG tablet Take 1 tablet by  mouth daily.  . Lecithin 1200 MG CAPS Take 1 capsule by mouth daily.    Marland Kitchen loratadine (CLARITIN) 10 MG tablet Take 10 mg by mouth daily.    Marland Kitchen losartan (COZAAR) 100 MG tablet Take 1 tablet by mouth  daily  . Multiple Vitamin (MULTIVITAMIN) capsule Take 1 capsule by mouth daily.    . naproxen (NAPROSYN) 500 MG tablet Take 1 tablet (500 mg total) by mouth 2 (two) times daily.  . Omega-3 Fatty Acids (DIALYVITE OMEGA-3 CONCENTRATE) 600 MG CAPS Take 1 capsule by mouth daily.  . pantoprazole (PROTONIX) 40 MG tablet Take 1 tablet by mouth  daily  . Tamsulosin HCl (FLOMAX) 0.4 MG CAPS Take 0.4 mg by mouth daily.    . traMADol (ULTRAM) 50 MG tablet Take 1 tablet (50 mg total) by mouth 3 (three) times daily.  . [DISCONTINUED] amLODipine (NORVASC) 5 MG tablet Take 1 tablet by mouth  daily  . [DISCONTINUED] losartan (COZAAR) 100 MG tablet Take 1 tablet by mouth  daily  . [DISCONTINUED] naproxen (NAPROSYN) 500 MG tablet Take 500 mg by mouth 2 (two) times daily.   . [DISCONTINUED] pantoprazole (PROTONIX) 40 MG  tablet Take 1 tablet by mouth  daily  . [DISCONTINUED] traMADol (ULTRAM) 50 MG tablet Take 50 mg by mouth 3 (three) times daily.   Marland Kitchen doxycycline (VIBRAMYCIN) 100 MG capsule 10 mg 2 (two) times daily.   No facility-administered encounter medications on file as of 07/04/2015.    Allergies  Allergen Reactions  . Shellfish Allergy     Sensitive to shellfish    Current Medications, Allergies, Past Medical History, Past Surgical History, Family History, and Social History were reviewed in [employer] record.   Review of Systems        The patient complains of sleep disorder, dyspnea on exertion, gas/bloating, joint pain, stiffness, arthritis, paresthesias, and difficulty walking.  The patient denies fever, chills, sweats, anorexia, fatigue, weakness, malaise, weight loss, blurring, diplopia, eye irritation, eye discharge, vision loss, eye pain, photophobia, earache, ear discharge, tinnitus, decreased hearing, nasal congestion, nosebleeds, sore throat, hoarseness, chest pain, palpitations, syncope, orthopnea, PND, peripheral edema, cough, dyspnea at rest, excessive sputum, hemoptysis, wheezing, pleurisy, nausea, vomiting, diarrhea, constipation, change in bowel habits, abdominal pain, melena, hematochezia, jaundice, indigestion/heartburn, dysphagia, odynophagia, dysuria, hematuria, urinary frequency, urinary hesitancy, nocturia, incontinence, back pain, joint swelling, muscle cramps, muscle weakness, sciatica, restless legs, leg pain at night, leg pain with exertion, rash, itching, dryness, suspicious lesions, paralysis, seizures, tremors, vertigo, transient blindness, frequent falls, frequent headaches, depression, anxiety, memory loss, confusion, cold intolerance, heat intolerance, polydipsia, polyphagia, polyuria, unusual weight change, abnormal bruising, bleeding, enlarged lymph nodes, urticaria, allergic rash, hay fever, and recurrent infections.     Objective:   Physical  Exam     WD, WN, 81 y/o WM in NAD... GENERAL:  Alert & oriented; pleasant & cooperative... HEENT:  [place]/AT, EOM-wnl, PERRLA, EACs-clear, TMs-wnl, NOSE-clear, THROAT-clear & wnl. NECK:  Supple w/ fairROM; no JVD; normal carotid impulses w/o bruits; no thyromegaly or nodules palpated; no lymphadenopathy. CHEST:  Clear to P & A; without wheezes/ rales/ or rhonchi. HEART:  Regular Rhythm; without murmurs/ rubs/ or gallops; pacer in left shoulder area... ABDOMEN:  Soft & nontender; normal bowel sounds; no organomegaly or masses detected. EXT: without deformities, mild arthritic changes; no varicose veins/ +venous insuffic/ no edema. NEURO:  CN's intact;  fair gait;  Motor normal, sensory variable deficits... strength seems OK, min decr sensation in LEs. DERM:  No lesions noted;  no rash etc...  RADIOLOGY DATA:  Reviewed in the EPIC EMR & discussed w/ the patient...  LABORATORY DATA:  Reviewed in the EPIC EMR & discussed w/ the patient...   Assessment & Plan:    OSA>  Followed by DrClance & stable on CPAP; we will try to get him a new machine thru [facility]...  HBP>  His BP is improved on Cozaar & Amlodipine, continue same...  Hx Syncope/ Pacer>  Followed by DrKlein & pacer functioning normally...  DM, borderline>  On diet alone & managing well, A1c remains 6.4 & we reviewed the import of wt reduction...  GERD, Hx Barrett's>  S/p EGD from DrPyrtle w/ small segment Barrett's mucosa, no dysplasia...  Divertics, IBS>  Colonoscopy 11/14 w/ 56mm tub adenoma removed...  BPH>  Followed by DrEskridge at [facility]; biggest issue is control & he says it was from not emptying completely, they have tried several meds and continue to follow up regularly, he reports PSAs have been ok; he will get notes to [place]...  DJD>  Followed by Kirby Funk & still having difficulty w/ pain & stiffness...  Periph Neuropathy>  Eval by Neuro, cryptogenic, no meds needed & symptoms remain the same...   Patient's  Medications  New Prescriptions   No medications on file  Previous Medications   ACETAMINOPHEN (TYLENOL) 500 MG TABLET    Take 1,000 mg by mouth every 6 (six) hours as needed for pain.   ASPIRIN 81 MG TABLET    Take 81 mg by mouth daily.   CALCIUM CARBONATE (OS-CAL) 600 MG TABS    Take 600 mg by mouth daily.     CHOLECALCIFEROL (VITAMIN D) 1000 UNITS CAPSULE    Take 1,000 Units by mouth daily.     DOXYCYCLINE (VIBRAMYCIN) 100 MG CAPSULE    10 mg 2 (two) times daily.   FINASTERIDE (PROSCAR) 5 MG TABLET    Take 1 tablet by mouth daily.   LECITHIN 1200 MG CAPS    Take 1 capsule by mouth daily.     LORATADINE (CLARITIN) 10 MG TABLET    Take 10 mg by mouth daily.     MULTIPLE VITAMIN (MULTIVITAMIN) CAPSULE    Take 1 capsule by mouth daily.     OMEGA-3 FATTY ACIDS (DIALYVITE OMEGA-3 CONCENTRATE) 600 MG CAPS    Take 1 capsule by mouth daily.   TAMSULOSIN HCL (FLOMAX) 0.4 MG CAPS    Take 0.4 mg by mouth daily.    Modified Medications   Modified Medication Previous Medication   AMLODIPINE (NORVASC) 5 MG TABLET amLODipine (NORVASC) 5 MG tablet      Take 1 tablet by mouth  daily    Take 1 tablet by mouth  daily   LOSARTAN (COZAAR) 100 MG TABLET losartan (COZAAR) 100 MG tablet      Take 1 tablet by mouth  daily    Take 1 tablet by mouth  daily   NAPROXEN (NAPROSYN) 500 MG TABLET naproxen (NAPROSYN) 500 MG tablet      Take 1 tablet (500 mg total) by mouth 2 (two) times daily.    Take 500 mg by mouth 2 (two) times daily.    PANTOPRAZOLE (PROTONIX) 40 MG TABLET pantoprazole (PROTONIX) 40 MG tablet      Take 1 tablet by mouth  daily    Take 1 tablet by mouth  daily   TRAMADOL (ULTRAM) 50 MG TABLET traMADol (ULTRAM) 50 MG tablet      Take 1 tablet (50 mg total) by  mouth 3 (three) times daily.    Take 50 mg by mouth 3 (three) times daily.   Discontinued Medications   No medications on file

## 2015-07-10 NOTE — Progress Notes (Signed)
Patient Care Team: [name], MD as PCP - General   HPI  Tony Cruz is a 81 y.o. male is seen in followup for syncope secondary to intermittent sinus node dysfunction for which a pacemaker was previously implanted and changed out 11/08. He has had intercurrent syncope thought to be vasovagal   And he has had recurrent episodes, these were postprandial. They were associated with a recognizable prodrome. His rate drop response on his pacemaker activated.   Had intercurrent event in restaurant  Tried to go to [facility] and became presyncopal on the wya back  Typical event    Past Medical History  Diagnosis Date  . Obstructive sleep apnea   . hypertension   . Syncope and collapse   . Pacemaker-MDT     Change out 2008  . Other abnormal glucose   . Overweight(278.02)   . Esophageal reflux   . Diverticulosis of colon (without mention of hemorrhage)   . Irritable bowel syndrome   . Hypertrophy of prostate with urinary obstruction and other lower urinary tract symptoms (LUTS)   . Osteoarthrosis, unspecified whether generalized or localized, unspecified site   . Backache, unspecified   . Unspecified hereditary and idiopathic peripheral neuropathy   . Anal fissure   . Allergy     Past Surgical History  Procedure Laterality Date  . Appendectomy    . Cholecystectomy    . Tonsillectomy    . Pacemaker placement    . Skin cancer removed  12/2012    removed from his face--Dr. Sarajane Jews    Current Outpatient Prescriptions  Medication Sig Dispense Refill  . acetaminophen (TYLENOL) 500 MG tablet Take 1,000 mg by mouth every 6 (six) hours as needed for pain.    Marland Kitchen amLODipine (NORVASC) 5 MG tablet Take 1 tablet by mouth  daily 90 tablet 2  . aspirin 81 MG tablet Take 81 mg by mouth daily.    . calcium carbonate (OS-CAL) 600 MG TABS Take 600 mg by mouth daily.      . Cholecalciferol (VITAMIN D) 1000 UNITS capsule Take 1,000 Units by mouth daily.      . finasteride (PROSCAR) 5 MG  tablet Take 1 tablet by mouth daily.    . Lecithin 1200 MG CAPS Take 1 capsule by mouth daily.      Marland Kitchen loratadine (CLARITIN) 10 MG tablet Take 10 mg by mouth daily.      Marland Kitchen losartan (COZAAR) 100 MG tablet Take 1 tablet by mouth  daily 90 tablet 2  . Multiple Vitamin (MULTIVITAMIN) capsule Take 1 capsule by mouth daily.      . naproxen (NAPROSYN) 500 MG tablet Take 1 tablet (500 mg total) by mouth 2 (two) times daily. 60 tablet 2  . Omega-3 Fatty Acids (DIALYVITE OMEGA-3 CONCENTRATE) 600 MG CAPS Take 1 capsule by mouth daily.    . pantoprazole (PROTONIX) 40 MG tablet Take 1 tablet by mouth  daily 90 tablet 2  . Tamsulosin HCl (FLOMAX) 0.4 MG CAPS Take 0.4 mg by mouth daily.      . traMADol (ULTRAM) 50 MG tablet Take 1 tablet (50 mg total) by mouth 3 (three) times daily. 90 tablet 2   No current facility-administered medications for this visit.    Allergies  Allergen Reactions  . Shellfish Allergy     Sensitive to shellfish    Review of Systems negative except from HPI and PMH  Physical Exam BP 120/82 mmHg  Pulse 77  Ht  5\' 9"  (1.753 m)  Wt 204 lb 12.8 oz (92.897 kg)  BMI 30.23 kg/m2 Well developed and well nourished in no acute distress HENT normal E scleral and icterus clear Neck Supple JVP flat; carotids brisk and full Clear to ausculation  Regular rate and rhythm, no murmurs gallops or rub Soft with active bowel sounds No clubbing cyanosis none Edema Alert and oriented, grossly normal motor and sensory function Skin Warm and Dry       ECG demonstrates sinus rhythm a77 15/13/46 RBBB  Otherwise normal  Assessment and plan  Syncope presumed neurally mediated  Sinus node dysfunction-prob neurally   Pacemaker The patient's device was interrogated.  The information was reviewed. No changes were made in the programming.     Hypertension   Balance Neuropathy     He had intercurent event which is typical  We discussed extensively the issues of dysautonomia, the  physiology of orthstasis and positional stress.  We discussed the role of salt and water repletion, the importance of exercise, often needing to be started in the recumbent position, and the awareness of triggers and the role of ambient heat and dehydration  He should be recumbent   At change out we should consider Biotron[place] with CLS    We spent more than 50% of our >25 min visit in face to face counseling regarding the above

## 2015-07-11 ENCOUNTER — Encounter: Payer: Self-pay | Admitting: Internal Medicine

## 2015-07-11 ENCOUNTER — Ambulatory Visit (INDEPENDENT_AMBULATORY_CARE_PROVIDER_SITE_OTHER): Payer: Medicare Other | Admitting: Internal Medicine

## 2015-07-11 VITALS — BP 120/82 | HR 77 | Ht 69.0 in | Wt 204.8 lb

## 2015-07-11 DIAGNOSIS — Z950 Presence of cardiac pacemaker: Secondary | ICD-10-CM | POA: Diagnosis not present

## 2015-07-11 DIAGNOSIS — I495 Sick sinus syndrome: Secondary | ICD-10-CM | POA: Diagnosis not present

## 2015-07-11 DIAGNOSIS — R55 Syncope and collapse: Secondary | ICD-10-CM | POA: Diagnosis not present

## 2015-07-11 LAB — CUP PACEART INCLINIC DEVICE CHECK
Battery Impedance: 2858 Ohm
Battery Remaining Longevity: 19 mo
Battery Voltage: 2.69 V
Brady Statistic AP VP Percent: 0 %
Brady Statistic AS VP Percent: 0 %
Brady Statistic AS VS Percent: 100 %
Date Time Interrogation Session: 20161213210025
Implantable Lead Implant Date: 19990330
Implantable Lead Location: 753859
Implantable Lead Location: 753860
Implantable Lead Model: 5068
Lead Channel Impedance Value: 728 Ohm
Lead Channel Pacing Threshold Amplitude: 0.75 V
Lead Channel Pacing Threshold Pulse Width: 0.4 ms
Lead Channel Sensing Intrinsic Amplitude: 2 mV
Lead Channel Sensing Intrinsic Amplitude: 2.8 mV
Lead Channel Setting Pacing Amplitude: 2 V
Lead Channel Setting Pacing Pulse Width: 0.4 ms
Lead Channel Setting Sensing Sensitivity: 2 mV
MDC IDC LEAD IMPLANT DT: 19990330
MDC IDC MSMT LEADCHNL RA IMPEDANCE VALUE: 696 Ohm
MDC IDC MSMT LEADCHNL RA PACING THRESHOLD AMPLITUDE: 0.75 V
MDC IDC MSMT LEADCHNL RA PACING THRESHOLD PULSEWIDTH: 0.4 ms
MDC IDC SET LEADCHNL RV PACING AMPLITUDE: 2.5 V
MDC IDC STAT BRADY AP VS PERCENT: 0 %

## 2015-07-11 NOTE — Patient Instructions (Signed)
Medication Instructions: - no changes  Labwork: - none  Procedures/Testing: - none  Follow-Up: - Remote monitoring is used to monitor your Pacemaker of ICD from home. This monitoring reduces the number of office visits required to check your device to one time per year. It allows [place] to keep an eye on the functioning of your device to ensure it is working properly. You are scheduled for a device check from home on 10/10/15. You may send your transmission at any time that day. If you have a wireless device, the transmission will be sent automatically. After your physician reviews your transmission, you will receive a postcard with your next transmission date.  - Your physician wants you to follow-up in: 1 year with [name]. You will receive a reminder letter in the mail two months in advance. If you don't receive a letter, please call our office to schedule the follow-up appointment.  Any Additional Special Instructions Will Be Listed Below (If Applicable).

## 2015-07-14 ENCOUNTER — Telehealth: Payer: Self-pay | Admitting: Pulmonary Disease

## 2015-07-14 MED ORDER — TRAMADOL HCL 50 MG PO TABS: 50.0000 mg | ORAL_TABLET | Freq: Three times a day (TID) | ORAL | Status: DC

## 2015-07-14 NOTE — Telephone Encounter (Signed)
Pt is requesting another Rx of his Tramadol - Rx was sent to [facility] 07/04/15 for #90 with 2 refills.  Pt states that after this month he will no longer be able to use [facility].  Switching to [facility] in Jan 2017 and will need a printed Rx to take the drug store. Pt also asking that since he is taking this TID why it was only prescribed for #90 and not #270.  Please advise [name] on Rx and instructions. Thanks.    Medication List       This list is accurate as of: 07/14/15  2:57 PM.  Always use your most recent med list.               acetaminophen 500 MG tablet  Commonly known as:  TYLENOL  Take 1,000 mg by mouth every 6 (six) hours as needed for pain.     amLODipine 5 MG tablet  Commonly known as:  NORVASC  Take 1 tablet by mouth  daily     aspirin 81 MG tablet  Take 81 mg by mouth daily.     calcium carbonate 600 MG Tabs tablet  Commonly known as:  OS-CAL  Take 600 mg by mouth daily.     DIALYVITE OMEGA-3 CONCENTRATE 600 MG Caps  Take 1 capsule by mouth daily.     finasteride 5 MG tablet  Commonly known as:  PROSCAR  Take 1 tablet by mouth daily.     FLOMAX 0.4 MG Caps capsule  Generic drug:  tamsulosin  Take 0.4 mg by mouth daily.     Lecithin 1200 MG Caps  Take 1 capsule by mouth daily.     loratadine 10 MG tablet  Commonly known as:  CLARITIN  Take 10 mg by mouth daily.     losartan 100 MG tablet  Commonly known as:  COZAAR  Take 1 tablet by mouth  daily     multivitamin capsule  Take 1 capsule by mouth daily.     naproxen 500 MG tablet  Commonly known as:  NAPROSYN  Take 1 tablet (500 mg total) by mouth 2 (two) times daily.     pantoprazole 40 MG tablet  Commonly known as:  PROTONIX  Take 1 tablet by mouth  daily     traMADol 50 MG tablet  Commonly known as:  ULTRAM  Take 1 tablet (50 mg total) by mouth 3 (three) times daily.     Vitamin D 1000 UNITS capsule  Take 1,000 Units by mouth daily.       Allergies  Allergen Reactions  . Shellfish  Allergy     Sensitive to shellfish

## 2015-07-14 NOTE — Telephone Encounter (Signed)
Per SN>> Ok to refill with #270 and 1 additional refill  Order signed and mailed to pt per pt request  Nothing further is needed

## 2015-08-02 DIAGNOSIS — R69 Illness, unspecified: Secondary | ICD-10-CM | POA: Diagnosis not present

## 2015-08-21 DIAGNOSIS — R69 Illness, unspecified: Secondary | ICD-10-CM | POA: Diagnosis not present

## 2015-09-21 ENCOUNTER — Telehealth: Payer: Self-pay | Admitting: Pulmonary Disease

## 2015-09-21 NOTE — Telephone Encounter (Signed)
Per 12.6.16 ov w/ SN: Patient Instructions       Today we updated your med list in our EPIC system...    Continue your current medications the same...    We refilled your meds per request...  Let's get on track w/ our diet & exercise program...  Call for any questions...  Let's plan a follow up visit in 56mo, sooner if needed for problems...   Per pt's chart, he was given #270 w/ 1 refill on 12.16.16 Tramadol 50mg  1po TID - chart states rx was mailed to patient SN is not in the office this week Unfortunately there is an issue with the office phones at this time and we cannot call out on a land line and there are no other contact numbers listed for pt [facility] tomorrow

## 2015-09-22 NOTE — Telephone Encounter (Signed)
LMTCB

## 2015-09-22 NOTE — Telephone Encounter (Signed)
[phone], pt cb °

## 2015-09-22 NOTE — Telephone Encounter (Signed)
lmtcb for pt.  

## 2015-09-25 MED ORDER — TRAMADOL HCL 50 MG PO TABS: 50.0000 mg | ORAL_TABLET | Freq: Three times a day (TID) | ORAL | Status: DC

## 2015-09-25 NOTE — Telephone Encounter (Signed)
Patient has changed insurance to [employer].  Patient said that he took a 30 day prescription to [facility] and they advised him that with his new insurance he can get the medication cheaper through [facility] order pharmacy.  Patient requesting a 90 day prescription of Tramadol to be faxed to [facility] number: [phone]  Current Outpatient Prescriptions on File Prior to Visit  Medication Sig Dispense Refill  . acetaminophen (TYLENOL) 500 MG tablet Take 1,000 mg by mouth every 6 (six) hours as needed for pain.    Marland Kitchen amLODipine (NORVASC) 5 MG tablet Take 1 tablet by mouth  daily 90 tablet 2  . aspirin 81 MG tablet Take 81 mg by mouth daily.    . calcium carbonate (OS-CAL) 600 MG TABS Take 600 mg by mouth daily.      . Cholecalciferol (VITAMIN D) 1000 UNITS capsule Take 1,000 Units by mouth daily.      . finasteride (PROSCAR) 5 MG tablet Take 1 tablet by mouth daily.    . Lecithin 1200 MG CAPS Take 1 capsule by mouth daily.      Marland Kitchen loratadine (CLARITIN) 10 MG tablet Take 10 mg by mouth daily.      Marland Kitchen losartan (COZAAR) 100 MG tablet Take 1 tablet by mouth  daily 90 tablet 2  . Multiple Vitamin (MULTIVITAMIN) capsule Take 1 capsule by mouth daily.      . naproxen (NAPROSYN) 500 MG tablet Take 1 tablet (500 mg total) by mouth 2 (two) times daily. 60 tablet 2  . Omega-3 Fatty Acids (DIALYVITE OMEGA-3 CONCENTRATE) 600 MG CAPS Take 1 capsule by mouth daily.    . pantoprazole (PROTONIX) 40 MG tablet Take 1 tablet by mouth  daily 90 tablet 2  . Tamsulosin HCl (FLOMAX) 0.4 MG CAPS Take 0.4 mg by mouth daily.      . traMADol (ULTRAM) 50 MG tablet Take 1 tablet (50 mg total) by mouth 3 (three) times daily. 270 tablet 1   No current facility-administered medications on file prior to visit.   Allergies  Allergen Reactions  . Shellfish Allergy     Sensitive to shellfish

## 2015-09-25 NOTE — Telephone Encounter (Signed)
Pt retuning call, let him know that rx has been ok'd printed and faxed, he was ok' with that.Hillery Hunter'

## 2015-09-25 NOTE — Telephone Encounter (Signed)
Per SN: Ok to refill tramadol 1 po tid prn #270 for 90 day supply with 1 refill. lmtcb X1 for pt to make aware of refill.   rx printed and faxed.

## 2015-10-10 ENCOUNTER — Ambulatory Visit (INDEPENDENT_AMBULATORY_CARE_PROVIDER_SITE_OTHER): Payer: Medicare HMO | Admitting: *Deleted

## 2015-10-10 DIAGNOSIS — Z950 Presence of cardiac pacemaker: Secondary | ICD-10-CM

## 2015-10-10 DIAGNOSIS — I495 Sick sinus syndrome: Secondary | ICD-10-CM | POA: Diagnosis not present

## 2015-10-10 NOTE — Progress Notes (Signed)
Remote pacemaker transmission.   

## 2015-10-18 ENCOUNTER — Telehealth: Payer: Self-pay | Admitting: Pulmonary Disease

## 2015-10-18 NOTE — Telephone Encounter (Signed)
Per SN:  Add on schedule tomorrow 3.23.17 Thursday at 2pm. Thanks.

## 2015-10-18 NOTE — Telephone Encounter (Signed)
Gave the patient an appt for 10/19/15  Pt aware/cb

## 2015-10-18 NOTE — Telephone Encounter (Signed)
Spoke with the pt  He is c/o dizziness and loss of balance for the past 9 days  He states he is fine if lying down, but otherwise feels uncomfortable and is afraid to drive  His spouse is recovering from hip surgery and he needs to be well to take care of her  He has seen ENT- Wilburn Cornelia in the past, but this has been several years and was for eval of hearing loss  He states his BP has been normal and not having any other problems  He states will go to [facility] if [facility], but really wanted to see SN  Please advise thanks!

## 2015-10-18 NOTE — Telephone Encounter (Signed)
lmomtcb x1 

## 2015-10-19 ENCOUNTER — Encounter: Payer: Self-pay | Admitting: Pulmonary Disease

## 2015-10-19 ENCOUNTER — Other Ambulatory Visit (INDEPENDENT_AMBULATORY_CARE_PROVIDER_SITE_OTHER): Payer: Medicare HMO

## 2015-10-19 ENCOUNTER — Ambulatory Visit (INDEPENDENT_AMBULATORY_CARE_PROVIDER_SITE_OTHER): Payer: Medicare HMO | Admitting: Pulmonary Disease

## 2015-10-19 VITALS — BP 126/78 | HR 77 | Temp 98.3°F | Ht 69.0 in | Wt 212.8 lb

## 2015-10-19 DIAGNOSIS — Z8679 Personal history of other diseases of the circulatory system: Secondary | ICD-10-CM | POA: Diagnosis not present

## 2015-10-19 DIAGNOSIS — G609 Hereditary and idiopathic neuropathy, unspecified: Secondary | ICD-10-CM

## 2015-10-19 DIAGNOSIS — Z8781 Personal history of (healed) traumatic fracture: Secondary | ICD-10-CM | POA: Diagnosis not present

## 2015-10-19 DIAGNOSIS — R42 Dizziness and giddiness: Secondary | ICD-10-CM | POA: Diagnosis not present

## 2015-10-19 DIAGNOSIS — I10 Essential (primary) hypertension: Secondary | ICD-10-CM

## 2015-10-19 DIAGNOSIS — Z950 Presence of cardiac pacemaker: Secondary | ICD-10-CM

## 2015-10-19 LAB — BASIC METABOLIC PANEL
BUN: 12 mg/dL (ref 6–23)
CALCIUM: 9.3 mg/dL (ref 8.4–10.5)
CO2: 31 meq/L (ref 19–32)
CREATININE: 0.9 mg/dL (ref 0.40–1.50)
Chloride: 100 mEq/L (ref 96–112)
GFR: 86.33 mL/min (ref >60.00)
GLUCOSE: 101 mg/dL — AB (ref 70–99)
Potassium: 4.2 mEq/L (ref 3.5–5.1)
Sodium: 137 mEq/L (ref 135–145)

## 2015-10-19 MED ORDER — MECLIZINE HCL 25 MG PO TABS: 25.0000 mg | ORAL_TABLET | Freq: Three times a day (TID) | ORAL | Status: DC

## 2015-10-19 NOTE — Patient Instructions (Addendum)
Today we updated your med list in our EPIC system...    Continue your current medications the same...  The dizziness sounds like a type of positional vertigo...    The treatment would be Epley maneuvers and Meclizine every 6H as needed...  We will check an CT scan of your head w/ your hx of skull fx & subdural hematoma in 2015...    We need to check a metabolic panel too...    If the scan is abnormal or the dizziness does not resolve> we will send you to the Neurology team for their review...   Call for any questions.Marland KitchenMarland Kitchen

## 2015-10-20 LAB — CUP PACEART REMOTE DEVICE CHECK
Battery Impedance: 3398 Ohm
Battery Remaining Longevity: 15 mo
Battery Voltage: 2.69 V
Brady Statistic AP VP Percent: 0 %
Brady Statistic AS VP Percent: 0 %
Date Time Interrogation Session: 20170313205117
Implantable Lead Implant Date: 19990330
Implantable Lead Model: 5068
Implantable Lead Model: 5092
Lead Channel Impedance Value: 688 Ohm
Lead Channel Pacing Threshold Amplitude: 0.75 V
Lead Channel Pacing Threshold Amplitude: 0.875 V
Lead Channel Pacing Threshold Pulse Width: 0.4 ms
Lead Channel Sensing Intrinsic Amplitude: 0.7 mV
Lead Channel Setting Pacing Amplitude: 2 V
Lead Channel Setting Pacing Amplitude: 2.5 V
Lead Channel Setting Sensing Sensitivity: 2 mV
MDC IDC LEAD IMPLANT DT: 19990330
MDC IDC LEAD LOCATION: 753859
MDC IDC LEAD LOCATION: 753860
MDC IDC MSMT LEADCHNL RV IMPEDANCE VALUE: 720 Ohm
MDC IDC MSMT LEADCHNL RV PACING THRESHOLD PULSEWIDTH: 0.4 ms
MDC IDC MSMT LEADCHNL RV SENSING INTR AMPL: 5.6 mV
MDC IDC SET LEADCHNL RV PACING PULSEWIDTH: 0.4 ms
MDC IDC STAT BRADY AP VS PERCENT: 0 %
MDC IDC STAT BRADY AS VS PERCENT: 100 %

## 2015-10-20 NOTE — Progress Notes (Addendum)
Subjective:    Patient ID: Tony Cruz, male    DOB: 1933-09-04, 82 y.o.   MRN: [id number]  HPI 82 y/o WM here for a follow up visit... he has multiple medical problems as noted below...  ~  SEE PREV EPIC NOTES FOR OLDER DATA >>    LABS 7/14:  FLP- ok x TG=190 on diet alone;  Chems- ok x BS=114 A1c=6.6;  CBC- wnl;  TSH=1.27;  PSA=0.89;  Uric=5.9.Marland KitchenMarland Kitchen  ~  December 28, 2013:  67yr Tony Cruz states he had a "crummy year"> notes that he fell on the ice 2/15 & hit his head, went to the ER w/ skull fx & subdural hematoma;  He has seen DrAquino for [facility], DrNudelman w/ conservative management but he developed residual HA, dizziness, and BPPV- ultimately sent to DrShoemaker w/ Epley maneuvers & finally improved...  He has also had BP issues w/ pressure in the 150/90 range so we called in Amlodipine 5mg /d- now BP improved to the 130/80 range...  We reviewed the following medical problems during today's office visit >>     OSA> his eval & initial therapy was done by the [place]; followed by DrClance w/ good compliance & no issues w/ sleep or daytime hypersomnolence...     HBP> on ASA81, Cozaar100 & Amlod5; BP= 132/88 & he denies CP, palpit, SOB, edema; DrKlein stopped his Hct due to dizziness......    HxSyncope, sinus node dysfunction, Pacer> followed by DrKlein, pacer function normally, no issues ident...    BorderlineDM> on diet alone; Labs 6/15 showed BS=121, A1c=6.4; we reviewed diet, exercise, wt reduction...     Overweight> weight= 218# and BMI= 33... We reviewed diet, exercise, wt reduction strategies...    GI- GERD, Barrett's, Divertics, IBS> on Prilosec20; known [place], HxBarrett's, prev Rx by DrWeissman & now DrPyrtle at [facility] w/ EGD 11/14 showing gastritis, esophagitis, gastric polyps (path=+Barretts, reflux related injury, neg HPylori); colonoscopy 11/14 showing mod divertics, & 51mm polyp in cecum (tub adenoma)...    GU- BPH> on Flomax0.4 & Proscar5; followed by DrRDavis=> DrEskridge    DJD,  LBP> on Naprosyn, Tramadol; c/o hands and ankles stiff but he still plays golf; eval by Kirby Funk & he released him "I've done all I can do for you"    Fall 2/15 w/ nondisplaced skull fx and subdural hematoma> treated conservatively, he had some resid HA, dizzy, etc & treated w/ Epley maneuvers- improved...    Periph neuropathy> thorough eval 2011 by DrReynolds & Hickling- idiopathic; not on specific therapy for this...    Skin cancer> he had a squamous cell removed from his left cheek 5/14 by DrDJones... We reviewed prob list, meds, xrays and labs> see below for updates >> we gave him the PREVNAR-13 vaccination today...  CXR 6/15 showed normal heart size, mild tortuous Ao, pacer, clear lungs, NAD...  LABS 6/15:  FLP- wnl on diet;  Chems- wnl x BS=121, A1c=6.4;  CBC- wnl;  TSH=1.73;  VitD=49...   ~  July 01, 2014:  66mo Tony Cruz brought a list of problems to discuss today> He notes decreased smell and taste, exam is neg and we discussed trial of Rx w/ Antihist, Flonase, nasal saline;  He has lost 16# on diet & exercise down to 202# (BMI=30.7), he is encouraged to keep up the good work & get weight down further;  He is c/o some neuropathy discomfort in feet but no real pain, he is encouraged to continue wt reduction, low carb diet, use Tylenol prn;  He notes arthritis in his hands & he has seen DrAnderson in the past who rec aleve prn ("he could help me if I had Rheumatoid arthritis")...     HBP> on ASA81, Cozaar100 & Amlod5; BP= 120/84 & he denies CP, palpit, SOB, edema; DrKlein prev stopped his Hct due to dizziness...Marland KitchenMarland KitchenMarland Kitchen    BorderlineDM> on diet alone; weight is down 16# to 202# today; Labs 6/15 showed BS=121, A1c=6.4; we reviewed diet, exercise, wt reduction...     GI- GERD, Barrett's, Divertics, IBS> on Pantoprazole40; known [place], HxBarrett's, prev Rx by DrWeissman & now DrPyrtle at [facility] w/ EGD 11/14 showing gastritis, esophagitis, gastric polyps (path=+Barretts, reflux related injury, neg  HPylori); colonoscopy 11/14 showing mod divertics, & 80mm polyp in cecum (tub adenoma)...    GU- BPH> on Flomax0.4 & Proscar5; followed by DrRDavis=> DrEskridge...    DJD, LBP> on Naprosyn, Tylenol; c/o hands and ankles stiff but he still plays golf; eval by Kirby Funk & he released him "I've done all I can do for you"    Fall 2/15 w/ nondisplaced skull fx and subdural hematoma> treated conservatively, he had some resid HA, dizzy, etc & treated w/ Epley maneuvers- improved...    Periph neuropathy> thorough eval 2011 by DrReynolds & Hickling- idiopathic; min discomfort & not on specific therapy for this... We reviewed prob list, meds, xrays and labs> see below for updates >>   ~  January 03, 2015:  58mo ROV & Tony Cruz reports stable- "just aging" he says;  He brought a list of complaints/ questions today:  They are disrupted at home w/ kitchen remodel; notes his urine is darker than usual (UA is clear- asked to incr water intake); taking Naprosyn & worried about renal (Cr=0.89 & reassured); +FamHx DM & he wants another A1c (BS=114, A1c=6.0);  Neuropathy eval by DrAquino- reviewed w/ pt- not a lot of pain, mild burning, friend takes Gabapentin; disturbance of taste & worried about B12, Zinc, Copper (B12=594); leg cramps in thigh area- trying mustard, Tumeric; athritis pains- stiff, swelling, esp in hands... We reviewed the following medical problems during today's office visit >>     AR> on Claritin & Flonase added for "swelling" sensation... Note 10% eos on CBC...    OSA> followed by DrClance, stable on CPAP, continue same...    HBP> on ASA81, Cozaar100 & Amlod5; BP= 142/78 & he denies CP, palpit, SOB, edema; DrKlein prev stopped his Hct due to dizziness......    HxSyncope, sinus node dysfunction, Pacer> followed by DrKlein, pacer function normally, no issues ident...    BorderlineDM, overweight> on diet alone; weight is down 3# to 198# today; Labs 6/16 showed BS=114, A1c=6.0; we reviewed diet, exercise, wt  reduction...     GI- GERD, Barrett's, Divertics, IBS> on Pantoprazole40; known [place], HxBarrett's, prev Rx by DrWeissman & now DrPyrtle at [facility] w/ EGD 11/14 showing gastritis, esophagitis, gastric polyps (path=+Barretts, reflux related injury, neg HPylori); colonoscopy 11/14 showing mod divertics, & 36mm polyp in cecum (tub adenoma)...    GU- BPH> on Flomax0.4 & Proscar5; followed by DrRDavis=> DrEskridge... Asked to take meds regularly.    DJD, LBP> on Naprosyn, Tramadol, Tylenol; c/o hands and ankles stiff but he still plays golf; eval by Kirby Funk & he released him "I've done all I can do for you"    Fall 2/15 w/ nondisplaced skull fx and subdural hematoma> treated conservatively, he had some resid HA, dizzy, etc & treated w/ Epley maneuvers- improved...    Periph neuropathy> thorough eval 2011 by DrReynolds & Hickling-  idiopathic; 2nd opinion DrAquino- min discomfort & not on specific therapy for this...    Anxious> aware, not on meds... We reviewed prob list, meds, xrays and labs> see below for updates >>   CXR 6/16 showed norm heart size, pacer, clear lungs, NAD...  LABS 6/16:  FLP- at goals on diet;  Chems- ok x BS=114, A1c=6.0;  CBC- wnl x 10%eos;  TSH=1.23;  B12=594;  UA- clear, neg protein.... PLAN>>  All questions answered, stable on current med regimen; Flonase added, call for problems...  ~  July 04, 2015:  33mo Toone reports a good interval- feeling well & no new complaints or concerns... He requested refer to [facility] for hearing eval & allergy referral to [facility] for testing due to jellow jacket stings...    Hx OSA on CPAP prev followed by [facility]; gets mask & tubing changed Q40mo via [employer], rests well 6-8H/night, using CPAP every night, wakes rested & no daytime sleep pressure or issues w/ alertness...    BP controlled on Amlod5 & Losar100, measures 118/72 today & he denies HA, CP, palpit, SOB, edema, etc; pacer checked by DrKlein & stable...     Weight = 205# which is up 7#  from last OV 7 we reviewed diet, exercise, wt reducing strategies...     GI & GU stable on Protonix40 & Proscar5+Flomax0.4.Marland KitchenMarland Kitchen    He has DJD, LBP, and fell 2/15 w/ skull fx & subdural, neuropathy> he saw DrNudelman w/ rec for Aleve and exercise! Kirby Funk is retiring- on [employer] 7 notes that he hurts everyday.    Derm- DrDJones removed an inflamed cyst from his back... EXAM shows Afeb, VSS, O2sat=97% on RA;  HEENT- neg, mallampati2;  Chest- clear w/o w/r/r;  Heart- RR w/o m/r/g;  Abd-  Soft, nontender, neg;  Ext- neg w/o c/c/e;  Neuro- neuropathy, no other focal abn... IMP/PLAN>>  Engel is stable, continue same meds, we plan ROV in 99mo...   ~  October 19, 2015:  3-39mo ROV & add-on appt trequested for dizziness> pt c/o balance off, says it started 10/08/15- just awoke dizzy & thought it was prob recurrent vertigo like in 2015; he took OTC [place] & a nurse at his church demonstrated the Epley maneuvers; he improved enough to play a round of golf- walked 18 holes- but noticed he was worse again the next day. Noted that when he rolled over in bed he was very dizzy; he called ENT- DrShoemaker and was told to call us; pt denies n/v, denies f/c/s, he has been walking w/ wife's walker for balance/support; he has mult other somatic complaints...     EXAM shows Afeb, VSS, no nystagmus, O2sat=98% on RA;  HEENT- neg, ears clear, mallampati1;  Chest- clear w/o w/r/r;  Heart- RR w/o m/r/g;  Neuro- appears intact w/o focal abn... IMP/PLAN>>  Demonta is concerned that the fall, skull fx, subdural that he had Feb2015 could be causing this- we can't get MRI due to cardac pacer, therefore we will check CT Head;  In the interim he will Rx w/ antivert 25mg  tid prn and do the epley maneuvers Q4-6H; we discussed Neurology eval if symptoms persist...  ADDENDUM>>  CT Head 10/24/15 showed no acute changes- w/o infarct, hemmorhage, mass, etc; there were some chr changes w/ atrophy 7 small vessel dis, no abn enhancement, prev subdural  resolved...           Problem List:   OBSTRUCTIVE SLEEP APNEA (ICD-327.23) - evaluation, diagnosis, and Rx by the [name] (  their notes scanned into the EMR):  he had PSG 4/08 w/ AHI= 26, desat to 83%... placed on CPAP at 11cm H20 pressure... he also has some RLS & was on Requip transiently from the [place]. ~  4/11: pt indicates that he uses his CPAP 4/7 nights for 6-7 hr/night; he feels that he rests well, denies daytime hypersom, snoring, etc... he requests Sleep consult here in [place] (see DrClance consult). ~  5/13: he saw DrClance for Sleep f/u> noted 10# wt gain, pt reported using CPAP compliantly w/ good daytime alertness, no issues w/ mask but requesting new machine ([employer]) as his is >55yrs old; requested to work on wt reduction... ~  6/15: his eval & initial therapy was done by the [facility]; followed by DrClance w/ good compliance & no issues w/ sleep or daytime hypersomnolence.  ALLERGIC RHINITIS (ICD-477.9) - controlled on Claritin, and OTC meds... ~  12/15: we discussed trial of Antihist, Flonase, nasal saline to help his smell & taste...   HYPERTENSION (ICD-401.9) >>  ~  on ASA 81mg /d & HCTZ 25mg /d...  ~  CXR 4/11 is clear and WNL, NAD... ~  6/12:  BP=122/82 today and he denies HA, fatigue, visual changes, CP, palipit, dizziness, syncope, dyspnea, edema, etc... ~  6/13:  BP= 142/98 & he admits intermittently elev at home; we discussed change med to LOSARTAN/ HCT 100/12.5 daily; he will monitor BP & f/u here... ~  11/13:  BP= 132/88 & similar at home; denies CP, palpit, SOB, edema... ~  6/14:  BP= 126/84 & he remains asymptomatic... ~  6/15:  on ASA81, Cozaar100 & Amlod5; BP= 132/88 & he denies CP, palpit, SOB, edema; DrKlein stopped his Hct due to dizziness. ~  12/15: on ASA81, Cozaar100 & Amlod5; BP= 120/84 & he remains essentially asymptomatic...  Hx of SYNCOPE (ICD-780.2) & CARDIAC PACEMAKER IN SITU (ICD-V45.01) - he had pacer changed 11/08 DrKlein (hosp DC Summary reviewed)... he continues  regular pacer checks and f/u w/ DrKlein...  ~  cath 3/99 DrJoeLeB showed norm coronaries, norm LV, norm Ao... ~  He saw DrKlein 12/11 for f/u syncope secondary to sinus node dysfunction, treated w/ pacer & generator replaced 2008;  He noted intercurrent syncope that was felt to be vasovagal in origin (this occurred in the Cards clinic 8/11 while waiting for his wife, & he has a long hx of this from blood draws over the yrs) & DrKlein reprogramed his device; EKG w/ SBrady & otherw WNL... ~  He saw DrKlein for Cards/EP f/u 12/12> hx syncope related to [facility] dysfunction- devise working properly & no changes made to the programming; TSH & sed rate wetre checked and WNL... ~  Yearly f/u DrKlein 12/13> doing satis w/o intercurrent syncope, pacer working satis & f/u planned 101yr... ~  12/14: he had yearly ROV w/ DrKlein, doing satis & pacer OK, no changes made...  DIABETES MELLITUS, BORDERLINE (ICD-790.29) - he's been concerned about his BS due to [facility] and his neuropathy symptoms, but his BS has only been borderline elvated prev...  ~  labs 4/10 (wt=209#) showed BS= 113, A1c= 6.3.Marland Kitchen. rec> better diet, get wt down. ~  labs 4/11 (wt=222#) showed BS= 109, A1c= 6.2.Marland Kitchen. no meds yet, just needs to get wt down. ~  Labs 6/12 (wt=204#) showed BS= 102, A1v= 6.4 ~  Labs 6/13 (wt=213#) showed BS= 105, A1c= 6.4 ~  Labs 6/14 showed BS= 114, A1c= 6.6 ~  Labs 6/15 showed BS= 121, A1c= 6.4  OVERWEIGHT (ICD-278.02) - we discussed  diet + exercise therapy (again)... ~  weight 4/09 = 226#.Marland KitchenMarland Kitchen 5" 9" Tall for a BMI= 33-34 ~  weight 4/10 = 209#... BMI down to 31... keep up the good work. ~  weight 4/11 = 222# .Marland KitchenMarland Kitchen BMI up to 33. ~  Weight 6/12 = 204# ~  Weight 6/13 = 213# ~  Weight 6/14 = 214# ~  Weight 6/15 = 218# ~  Weight 12/15 = 202# keep up the good work...  GASTROESOPHAGEAL REFLUX DISEASE (ICD-530.81) w/ BARRETT'S ESOPH - followed by DrWeissman & Rx w/ OMEPRAZOLE 20mg Bid... ~  prev EGD's documented a [place] & Barrett's  esoph... (last 1/03 by DrPatterson). ~  last EGD was 2/08 showing Barrett's epithelium in lower 1/3 of esoph... f/u planned 66yrs. ~  4/11:  NOTE> f/u EGD is due, he will decide on gastroenterologist ==> he saw DrMJohnson w/ EGD 5/11 showing Irreg Z-line & bx c/w Barrett's esoph (no dysplasia), the rest of the esoph, stomach, & duodenum were WNL... ~  EGD 11/14 by DrPyrtle showed gastritis, esophagitis, gastric polyps (path=+Barretts, reflux related injury, neg HPylori)...  DIVERTICULOSIS OF COLON (ICD-562.10)  IRRITABLE BOWEL SYNDROME (ICD-564.1) ~  last colonoscopy 2/08 by DrWeissman showed left sided divertics, otherw neg... f/u planned 31yrs... ~  Colonoscopy 11/14 by DrPyrtle showed mod divertics, & 3mm polyp in cecum (tub adenoma)...  BENIGN PROSTATIC HYPERTROPHY, WITH OBSTRUCTION (ICD-600.01) - on FLOMAX 0.4mg /d & PROSCAR 5mg /d... eval and Rx by [facility] who checks pt q47mo (we don't have notes from him)... pt tells me he will be seeing DrDavis regularly when he moves to [facility]. ~  9/13: pt reports that he is now seeing DrEskridge at [facility] & his PSAs have all been normal; his major prob has been holding his urine & eval revealed not emptying completely- they have tried several meds to help this; he will have notes sent to [place] for [facility] records.  OSTEOARTHRITIS (ICD-715.90) >> BACK PAIN (ICD-724.5) >>  ~  managed by DrAnderson & difficult problem, thought to have an inflamm arthropathy (?variation of psoriatic arthritis) however he did not respond to NSAIDs/Celebrex, Pred, MTX, anti-TNF inhibitor; on Tramadol prn;  ~  pt saw DrBeane 6/10 for eval> neuropathy symptoms & lumbar spondylosis on XRays, Rx Mobic Prn... offered Myelogram for further eval... he also takes Calcium & Vit D. ~  He was evaluated by Kirby Funk for Rheum => on Naprosyn, Tylenol; c/o hands and ankles stiff but he still plays golf etc; states that they released him "I've done all I can do for you, if you had RA I could  help you"  FALL w/ SUBDURAL HEMATOMA >> treated conservatively, he had some resid HA, dizzy, etc & treated w/ Epley maneuvers- improved CT Head 4/15 showed mild atrophy & sm vessel dis & scat lacunes, prev left parietal extra-axial fluid collection is resolved...   PERIPHERAL NEUROPATHY (ICD-356.9) - DrReynolds evaluated him w/ NCV's etc and dx a peripheral neuropathy... this is one of his CC> numbness, decr sensation in feet, (no pain) w/ some assoc balance problems intermittently...  he tried Mentanx but no benefit...  ~  4/11: I reviewed DrReynolds 11/10 note w/ the patient... ~  He also had a thorough neurologic evaluation 10/11 from one of his church physicians, DrHickling, for f/u polyneuropathy> balance issues, LBP, gait abn, variable paresthesias, foot numbness (sl decr vibration & pin prick), some subjective memory problems w/ norm MMSE, abnormal NCVs;  Prev Labs were all WNL, unrevealing;  etiology of his neuropathy is unknown= cryptogenic, and since  it isn't painful he really didn't require any therapy... ~  6/13: he continues to experience prob w/ balance etc related to his neuropathy; not progressive & still not painful; also notes nocturnal leg cramps & he's tried bar of soap trick, mustard, etc- all w/o relief (we rec trial Tonic water)... ~  2015> Neuro follow up by DrAquino for [facility]...  Health Maintenance - he takes a number of Vits + lecithin, saw palmetto, Fish Oil, etc... he saw DrDJones in 2010 for Derm review- pt reports nothing major found...   Past Surgical History  Procedure Laterality Date  . Appendectomy    . Cholecystectomy    . Tonsillectomy    . Pacemaker placement    . Skin cancer removed  12/2012    removed from his face--Dr. Sarajane Jews    Outpatient Encounter Prescriptions as of 10/19/2015  Medication Sig  . acetaminophen (TYLENOL) 500 MG tablet Take 1,000 mg by mouth every 6 (six) hours as needed for pain.  Marland Kitchen amLODipine (NORVASC) 5 MG tablet Take 1 tablet  by mouth  daily  . aspirin 81 MG tablet Take 81 mg by mouth daily.  . calcium carbonate (OS-CAL) 600 MG TABS Take 600 mg by mouth daily.    . Cholecalciferol (VITAMIN D) 1000 UNITS capsule Take 1,000 Units by mouth daily.    . finasteride (PROSCAR) 5 MG tablet Take 1 tablet by mouth daily.  . Lecithin 1200 MG CAPS Take 1 capsule by mouth daily.    Marland Kitchen loratadine (CLARITIN) 10 MG tablet Take 10 mg by mouth daily.    Marland Kitchen losartan (COZAAR) 100 MG tablet Take 1 tablet by mouth  daily  . Multiple Vitamin (MULTIVITAMIN) capsule Take 1 capsule by mouth daily.    . naproxen (NAPROSYN) 500 MG tablet Take 1 tablet (500 mg total) by mouth 2 (two) times daily.  . Omega-3 Fatty Acids (DIALYVITE OMEGA-3 CONCENTRATE) 600 MG CAPS Take 1 capsule by mouth daily.  . pantoprazole (PROTONIX) 40 MG tablet Take 1 tablet by mouth  daily  . Tamsulosin HCl (FLOMAX) 0.4 MG CAPS Take 0.4 mg by mouth daily.    . traMADol (ULTRAM) 50 MG tablet Take 1 tablet (50 mg total) by mouth 3 (three) times daily.  . meclizine (ANTIVERT) 25 MG tablet Take 1 tablet (25 mg total) by mouth 3 (three) times daily.   No facility-administered encounter medications on file as of 10/19/2015.    Allergies  Allergen Reactions  . Shellfish Allergy     Sensitive to shellfish    Current Medications, Allergies, Past Medical History, Past Surgical History, Family History, and Social History were reviewed in [employer] record.   Review of Systems        The patient complains of sleep disorder, dyspnea on exertion, gas/bloating, joint pain, stiffness, arthritis, paresthesias, and difficulty walking.  The patient denies fever, chills, sweats, anorexia, fatigue, weakness, malaise, weight loss, blurring, diplopia, eye irritation, eye discharge, vision loss, eye pain, photophobia, earache, ear discharge, tinnitus, decreased hearing, nasal congestion, nosebleeds, sore throat, hoarseness, chest pain, palpitations, syncope,  orthopnea, PND, peripheral edema, cough, dyspnea at rest, excessive sputum, hemoptysis, wheezing, pleurisy, nausea, vomiting, diarrhea, constipation, change in bowel habits, abdominal pain, melena, hematochezia, jaundice, indigestion/heartburn, dysphagia, odynophagia, dysuria, hematuria, urinary frequency, urinary hesitancy, nocturia, incontinence, back pain, joint swelling, muscle cramps, muscle weakness, sciatica, restless legs, leg pain at night, leg pain with exertion, rash, itching, dryness, suspicious lesions, paralysis, seizures, tremors, vertigo, transient blindness, frequent falls, frequent headaches, depression,  anxiety, memory loss, confusion, cold intolerance, heat intolerance, polydipsia, polyphagia, polyuria, unusual weight change, abnormal bruising, bleeding, enlarged lymph nodes, urticaria, allergic rash, hay fever, and recurrent infections.     Objective:   Physical Exam     WD, WN, 82 y/o WM in NAD... GENERAL:  Alert & oriented; pleasant & cooperative... HEENT:  [place]/AT, EOM-wnl, PERRLA, EACs-clear, TMs-wnl, NOSE-clear, THROAT-clear & wnl. NECK:  Supple w/ fairROM; no JVD; normal carotid impulses w/o bruits; no thyromegaly or nodules palpated; no lymphadenopathy. CHEST:  Clear to P & A; without wheezes/ rales/ or rhonchi. HEART:  Regular Rhythm; without murmurs/ rubs/ or gallops; pacer in left shoulder area... ABDOMEN:  Soft & nontender; normal bowel sounds; no organomegaly or masses detected. EXT: without deformities, mild arthritic changes; no varicose veins/ +venous insuffic/ no edema. NEURO:  CN's intact;  fair gait;  Motor normal, sensory variable deficits... strength seems OK, min decr sensation in LEs. DERM:  No lesions noted; no rash etc...  RADIOLOGY DATA:  Reviewed in the EPIC EMR & discussed w/ the patient...  LABORATORY DATA:  Reviewed in the EPIC EMR & discussed w/ the patient...   Assessment & Plan:    OSA>  Followed by DrClance & stable on CPAP; we will try to  get him a new machine thru [employer]...  HBP>  His BP is improved on Cozaar & Amlodipine, continue same...  Hx Syncope/ Pacer>  Followed by DrKlein & pacer functioning normally...  DM, borderline>  On diet alone & managing well, A1c remains 6.4 & we reviewed the import of wt reduction...  GERD, Hx Barrett's>  S/p EGD from DrPyrtle w/ small segment Barrett's mucosa, no dysplasia...  Divertics, IBS>  Colonoscopy 11/14 w/ 50mm tub adenoma removed...  BPH>  Followed by DrEskridge at [facility]; biggest issue is control & he says it was from not emptying completely, they have tried several meds and continue to follow up regularly, he reports PSAs have been ok; he will get notes to [place]...  DJD>  Followed by Kirby Funk & still having difficulty w/ pain & stiffness...  Periph Neuropathy>  Eval by Neuro, cryptogenic, no meds needed & symptoms remain the same...   Patient's Medications  New Prescriptions   MECLIZINE (ANTIVERT) 25 MG TABLET    Take 1 tablet (25 mg total) by mouth 3 (three) times daily.  Previous Medications   ACETAMINOPHEN (TYLENOL) 500 MG TABLET    Take 1,000 mg by mouth every 6 (six) hours as needed for pain.   AMLODIPINE (NORVASC) 5 MG TABLET    Take 1 tablet by mouth  daily   ASPIRIN 81 MG TABLET    Take 81 mg by mouth daily.   CALCIUM CARBONATE (OS-CAL) 600 MG TABS    Take 600 mg by mouth daily.     CHOLECALCIFEROL (VITAMIN D) 1000 UNITS CAPSULE    Take 1,000 Units by mouth daily.     FINASTERIDE (PROSCAR) 5 MG TABLET    Take 1 tablet by mouth daily.   LECITHIN 1200 MG CAPS    Take 1 capsule by mouth daily.     LORATADINE (CLARITIN) 10 MG TABLET    Take 10 mg by mouth daily.     LOSARTAN (COZAAR) 100 MG TABLET    Take 1 tablet by mouth  daily   MULTIPLE VITAMIN (MULTIVITAMIN) CAPSULE    Take 1 capsule by mouth daily.     NAPROXEN (NAPROSYN) 500 MG TABLET    Take 1 tablet (500 mg total) by mouth  2 (two) times daily.   OMEGA-3 FATTY ACIDS (DIALYVITE OMEGA-3 CONCENTRATE)  600 MG CAPS    Take 1 capsule by mouth daily.   PANTOPRAZOLE (PROTONIX) 40 MG TABLET    Take 1 tablet by mouth  daily   TAMSULOSIN HCL (FLOMAX) 0.4 MG CAPS    Take 0.4 mg by mouth daily.     TRAMADOL (ULTRAM) 50 MG TABLET    Take 1 tablet (50 mg total) by mouth 3 (three) times daily.  Modified Medications   No medications on file  Discontinued Medications   No medications on file

## 2015-10-23 ENCOUNTER — Encounter: Payer: Self-pay | Admitting: Cardiology

## 2015-10-24 ENCOUNTER — Ambulatory Visit (INDEPENDENT_AMBULATORY_CARE_PROVIDER_SITE_OTHER)
Admission: RE | Admit: 2015-10-24 | Discharge: 2015-10-24 | Disposition: A | Discharge status: Home / Self Care | Payer: Medicare HMO | Source: Ambulatory Visit | Attending: Pulmonary Disease | Admitting: Pulmonary Disease

## 2015-10-24 DIAGNOSIS — Z8679 Personal history of other diseases of the circulatory system: Secondary | ICD-10-CM

## 2015-10-24 DIAGNOSIS — R42 Dizziness and giddiness: Secondary | ICD-10-CM | POA: Diagnosis not present

## 2015-10-24 DIAGNOSIS — Z8781 Personal history of (healed) traumatic fracture: Secondary | ICD-10-CM

## 2015-10-24 MED ORDER — IOPAMIDOL (ISOVUE-300) INJECTION 61%
75.0000 mL | Freq: Once | INTRAVENOUS | Status: AC | PRN
  Administered 2015-10-24: 75 mL via INTRAVENOUS

## 2015-12-11 ENCOUNTER — Telehealth: Payer: Self-pay | Admitting: Pulmonary Disease

## 2015-12-11 NOTE — Telephone Encounter (Signed)
LM for pt x 1  

## 2015-12-12 MED ORDER — LOSARTAN POTASSIUM 100 MG PO TABS: ORAL_TABLET | ORAL | Status: DC

## 2015-12-12 MED ORDER — NAPROXEN 500 MG PO TABS: 500.0000 mg | ORAL_TABLET | Freq: Two times a day (BID) | ORAL | Status: DC

## 2015-12-12 MED ORDER — AMLODIPINE BESYLATE 5 MG PO TABS: ORAL_TABLET | ORAL | Status: DC

## 2015-12-12 NOTE — Telephone Encounter (Signed)
Spoke with pt and confirmed he would like to have Amlodipine, Losartan and Naprosyn sent to [facility]. Rx's sent. Nothing further needed.

## 2015-12-12 NOTE — Telephone Encounter (Signed)
Patient called back returning our call -prm

## 2015-12-14 DIAGNOSIS — G4733 Obstructive sleep apnea (adult) (pediatric): Secondary | ICD-10-CM | POA: Diagnosis not present

## 2015-12-19 DIAGNOSIS — L570 Actinic keratosis: Secondary | ICD-10-CM | POA: Diagnosis not present

## 2015-12-19 DIAGNOSIS — L821 Other seborrheic keratosis: Secondary | ICD-10-CM | POA: Diagnosis not present

## 2015-12-19 DIAGNOSIS — L720 Epidermal cyst: Secondary | ICD-10-CM | POA: Diagnosis not present

## 2015-12-19 DIAGNOSIS — L812 Freckles: Secondary | ICD-10-CM | POA: Diagnosis not present

## 2015-12-19 DIAGNOSIS — L304 Erythema intertrigo: Secondary | ICD-10-CM | POA: Diagnosis not present

## 2015-12-19 DIAGNOSIS — Z85828 Personal history of other malignant neoplasm of skin: Secondary | ICD-10-CM | POA: Diagnosis not present

## 2015-12-19 DIAGNOSIS — D1801 Hemangioma of skin and subcutaneous tissue: Secondary | ICD-10-CM | POA: Diagnosis not present

## 2016-01-02 ENCOUNTER — Ambulatory Visit: Payer: Medicare Other | Admitting: Pulmonary Disease

## 2016-01-09 ENCOUNTER — Ambulatory Visit (INDEPENDENT_AMBULATORY_CARE_PROVIDER_SITE_OTHER): Payer: Medicare HMO | Admitting: *Deleted

## 2016-01-09 DIAGNOSIS — I495 Sick sinus syndrome: Secondary | ICD-10-CM

## 2016-01-09 NOTE — Progress Notes (Signed)
Remote pacemaker transmission.   

## 2016-01-12 LAB — CUP PACEART REMOTE DEVICE CHECK
Battery Impedance: 3618 Ohm
Battery Remaining Longevity: 14 mo
Battery Voltage: 2.67 V
Brady Statistic AP VP Percent: 0 %
Brady Statistic AS VP Percent: 0 %
Brady Statistic AS VS Percent: 99 %
Date Time Interrogation Session: 20170612201335
Implantable Lead Location: 753860
Implantable Lead Model: 5092
Lead Channel Impedance Value: 726 Ohm
Lead Channel Setting Pacing Amplitude: 2 V
Lead Channel Setting Pacing Amplitude: 2.5 V
Lead Channel Setting Pacing Pulse Width: 0.4 ms
MDC IDC LEAD IMPLANT DT: 19990330
MDC IDC LEAD IMPLANT DT: 19990330
MDC IDC LEAD LOCATION: 753859
MDC IDC LEAD MODEL: 5068
MDC IDC MSMT LEADCHNL RA IMPEDANCE VALUE: 711 Ohm
MDC IDC SET LEADCHNL RV SENSING SENSITIVITY: 2 mV
MDC IDC STAT BRADY AP VS PERCENT: 0 %

## 2016-01-17 ENCOUNTER — Encounter: Payer: Self-pay | Admitting: Cardiology

## 2016-01-18 ENCOUNTER — Telehealth: Payer: Self-pay | Admitting: Pulmonary Disease

## 2016-01-18 DIAGNOSIS — H9193 Unspecified hearing loss, bilateral: Secondary | ICD-10-CM

## 2016-01-18 DIAGNOSIS — M25561 Pain in right knee: Secondary | ICD-10-CM | POA: Diagnosis not present

## 2016-01-18 NOTE — Telephone Encounter (Signed)
Per Dr. Lenna Gilford, okay to place order.  Order entered for ENT referral for Hearing Loss.  Michelene Heady requests that this be done today because she is off tomorrow and she needs to get patient taken care of today.  [facility] notified.

## 2016-01-18 NOTE — Telephone Encounter (Signed)
Received call from Michelene Heady of [place] at [facility].  Needs Rx faxed to [phone] today for patient to have hearing evaluation.  Patient was referred to [place] for hearing evaluation on 05/02/2015. But nothing current. Dr. Lenna Gilford, are you okay with entering another referral for this patient? Please advise.

## 2016-01-18 NOTE — Telephone Encounter (Signed)
Tony Cruz called pt needs new referral for hearing eval. In order for Medicare to pay the order needs to be on letterhead/rx pad, include pt name,dob,dateof issue,state hearing eval to determine hearing loss- NO mention of hearing aid,NPI, signature with clear printed name beside it. She also would like a printed list of medicines. This can be faxed to [phone]. They would like this today, so patient can be scheduled next week. - prm

## 2016-01-18 NOTE — Telephone Encounter (Signed)
Order and records faxed to [facility]

## 2016-01-23 ENCOUNTER — Encounter: Payer: Self-pay | Admitting: Pulmonary Disease

## 2016-01-23 ENCOUNTER — Ambulatory Visit (INDEPENDENT_AMBULATORY_CARE_PROVIDER_SITE_OTHER): Payer: Medicare HMO | Admitting: Pulmonary Disease

## 2016-01-23 ENCOUNTER — Other Ambulatory Visit (INDEPENDENT_AMBULATORY_CARE_PROVIDER_SITE_OTHER): Payer: Medicare HMO

## 2016-01-23 VITALS — BP 114/68 | HR 73 | Temp 98.1°F | Ht 69.0 in | Wt 209.4 lb

## 2016-01-23 DIAGNOSIS — N401 Enlarged prostate with lower urinary tract symptoms: Secondary | ICD-10-CM

## 2016-01-23 DIAGNOSIS — I495 Sick sinus syndrome: Secondary | ICD-10-CM | POA: Diagnosis not present

## 2016-01-23 DIAGNOSIS — M545 Low back pain, unspecified: Secondary | ICD-10-CM

## 2016-01-23 DIAGNOSIS — M150 Primary generalized (osteo)arthritis: Secondary | ICD-10-CM

## 2016-01-23 DIAGNOSIS — N138 Other obstructive and reflux uropathy: Secondary | ICD-10-CM

## 2016-01-23 DIAGNOSIS — Z950 Presence of cardiac pacemaker: Secondary | ICD-10-CM

## 2016-01-23 DIAGNOSIS — I10 Essential (primary) hypertension: Secondary | ICD-10-CM | POA: Diagnosis not present

## 2016-01-23 DIAGNOSIS — G609 Hereditary and idiopathic neuropathy, unspecified: Secondary | ICD-10-CM

## 2016-01-23 DIAGNOSIS — G4733 Obstructive sleep apnea (adult) (pediatric): Secondary | ICD-10-CM

## 2016-01-23 DIAGNOSIS — R7301 Impaired fasting glucose: Secondary | ICD-10-CM

## 2016-01-23 DIAGNOSIS — M159 Polyosteoarthritis, unspecified: Secondary | ICD-10-CM

## 2016-01-23 DIAGNOSIS — R7989 Other specified abnormal findings of blood chemistry: Secondary | ICD-10-CM | POA: Diagnosis not present

## 2016-01-23 DIAGNOSIS — Z8679 Personal history of other diseases of the circulatory system: Secondary | ICD-10-CM

## 2016-01-23 LAB — COMPREHENSIVE METABOLIC PANEL
ALBUMIN: 4.1 g/dL (ref 3.5–5.2)
ALT: 30 U/L (ref 0–53)
AST: 19 U/L (ref 0–37)
Alkaline Phosphatase: 60 U/L (ref 39–117)
BILIRUBIN TOTAL: 0.8 mg/dL (ref 0.2–1.2)
BUN: 14 mg/dL (ref 6–23)
CALCIUM: 9.4 mg/dL (ref 8.4–10.5)
CO2: 32 mEq/L (ref 19–32)
CREATININE: 0.9 mg/dL (ref 0.40–1.50)
Chloride: 101 mEq/L (ref 96–112)
GFR: 86.27 mL/min (ref >60.00)
Glucose, Bld: 102 mg/dL — ABNORMAL HIGH (ref 70–99)
Potassium: 4.4 mEq/L (ref 3.5–5.1)
Sodium: 137 mEq/L (ref 135–145)
Total Protein: 6.9 g/dL (ref 6.0–8.3)

## 2016-01-23 LAB — CBC WITH DIFFERENTIAL/PLATELET
BASOS PCT: 0.6 % (ref 0.0–3.0)
Basophils Absolute: 0.1 10*3/uL (ref 0.0–0.1)
EOS PCT: 13.7 % — AB (ref 0.0–5.0)
Eosinophils Absolute: 1.5 10*3/uL — ABNORMAL HIGH (ref 0.0–0.7)
HEMATOCRIT: 41.1 % (ref 39.0–52.0)
Hemoglobin: 13.8 g/dL (ref 13.0–17.0)
LYMPHS PCT: 30.1 % (ref 12.0–46.0)
Lymphs Abs: 3.3 10*3/uL (ref 0.7–4.0)
MCHC: 33.5 g/dL (ref 30.0–36.0)
MCV: 92.8 fl (ref 78.0–100.0)
MONOS PCT: 8.4 % (ref 3.0–12.0)
Monocytes Absolute: 0.9 10*3/uL (ref 0.1–1.0)
NEUTROS ABS: 5.2 10*3/uL (ref 1.4–7.7)
Neutrophils Relative %: 47.2 % (ref 43.0–77.0)
PLATELETS: 285 10*3/uL (ref 150.0–400.0)
RBC: 4.44 Mil/uL (ref 4.22–5.81)
RDW: 15.5 % (ref 11.5–15.5)
WBC: 11 10*3/uL — ABNORMAL HIGH (ref 4.0–10.5)

## 2016-01-23 LAB — LIPID PANEL
CHOLESTEROL: 154 mg/dL (ref 0–200)
HDL: 45.2 mg/dL (ref >39.00)
NonHDL: 108.61
TRIGLYCERIDES: 213 mg/dL — AB (ref 0.0–149.0)
Total CHOL/HDL Ratio: 3
VLDL: 42.6 mg/dL — ABNORMAL HIGH (ref 0.0–40.0)

## 2016-01-23 LAB — TSH: TSH: 1.65 u[IU]/mL (ref 0.35–4.50)

## 2016-01-23 LAB — LDL CHOLESTEROL, DIRECT: LDL DIRECT: 83 mg/dL

## 2016-01-23 LAB — HEMOGLOBIN A1C: HEMOGLOBIN A1C: 6.1 % (ref 4.6–6.5)

## 2016-01-23 NOTE — Progress Notes (Signed)
Subjective:    Patient ID: Tony Cruz, male    DOB: 1933-10-06, 82 y.o.   MRN: [id number]  HPI 82 y/o WM here for a follow up visit... he has multiple medical problems as noted below...  ~  SEE PREV EPIC NOTES FOR OLDER DATA >>    LABS 7/14:  FLP- ok x TG=190 on diet alone;  Chems- ok x BS=114 A1c=6.6;  CBC- wnl;  TSH=1.27;  PSA=0.89;  Uric=5.9.Marland KitchenMarland Kitchen  CXR 6/15 showed normal heart size, mild tortuous Ao, pacer, clear lungs, NAD...  LABS 6/15:  FLP- wnl on diet;  Chems- wnl x BS=121, A1c=6.4;  CBC- wnl;  TSH=1.73;  VitD=49...   ~  January 03, 2015:  48mo [place] reports stable- "just aging" he says;  He brought a list of complaints/ questions today:  They are disrupted at home w/ kitchen remodel; notes his urine is darker than usual (UA is clear- asked to incr water intake); taking Naprosyn & worried about renal (Cr=0.89 & reassured); +FamHx DM & he wants another A1c (BS=114, A1c=6.0);  Neuropathy eval by DrAquino- reviewed w/ pt- not a lot of pain, mild burning, friend takes Gabapentin; disturbance of taste & worried about B12, Zinc, Copper (B12=594); leg cramps in thigh area- trying mustard, Tumeric; athritis pains- stiff, swelling, esp in hands... We reviewed the following medical problems during today's office visit >>     AR> on Claritin & Flonase added for "swelling" sensation... Note 10% eos on CBC...    OSA> followed by DrClance, stable on CPAP, continue same...    HBP> on ASA81, Cozaar100 & Amlod5; BP= 142/78 & he denies CP, palpit, SOB, edema; DrKlein prev stopped his Hct due to dizziness......    HxSyncope, sinus node dysfunction, Pacer> followed by DrKlein, pacer function normally, no issues ident...    BorderlineDM, overweight> on diet alone; weight is down 3# to 198# today; Labs 6/16 showed BS=114, A1c=6.0; we reviewed diet, exercise, wt reduction...     GI- GERD, Barrett's, Divertics, IBS> on Pantoprazole40; known [place], HxBarrett's, prev Rx by DrWeissman & now DrPyrtle at [facility]  w/ EGD 11/14 showing gastritis, esophagitis, gastric polyps (path=+Barretts, reflux related injury, neg HPylori); colonoscopy 11/14 showing mod divertics, & 20mm polyp in cecum (tub adenoma)...    GU- BPH> on Flomax0.4 & Proscar5; followed by DrRDavis=> DrEskridge... Asked to take meds regularly.    DJD, LBP> on Naprosyn, Tramadol, Tylenol; c/o hands and ankles stiff but he still plays golf; eval by Kirby Funk & he released him "I've done all I can do for you"    Fall 2/15 w/ nondisplaced skull fx and subdural hematoma> treated conservatively, he had some resid HA, dizzy, etc & treated w/ Epley maneuvers- improved...    Periph neuropathy> thorough eval 2011 by DrReynolds & Hickling- idiopathic; 2nd opinion DrAquino- min discomfort & not on specific therapy for this...    Anxious> aware, not on meds... We reviewed prob list, meds, xrays and labs> see below for updates >>   CXR 6/16 showed norm heart size, pacer, clear lungs, NAD...  LABS 6/16:  FLP- at goals on diet;  Chems- ok x BS=114, A1c=6.0;  CBC- wnl x 10%eos;  TSH=1.23;  B12=594;  UA- clear, neg protein.... PLAN>>  All questions answered, stable on current med regimen; Flonase added, call for problems...  ~  July 04, 2015:  12mo [place] reports a good interval- feeling well & no new complaints or concerns... He requested refer to [facility] for hearing eval &  allergy referral to [place] for testing due to jellow jacket stings...    Hx OSA on CPAP prev followed by [facility]; gets mask & tubing changed Q76mo via [employer], rests well 6-8H/night, using CPAP every night, wakes rested & no daytime sleep pressure or issues w/ alertness...    BP controlled on Amlod5 & Losar100, measures 118/72 today & he denies HA, CP, palpit, SOB, edema, etc; pacer checked by DrKlein & stable...     Weight = 205# which is up 7# from last OV 7 we reviewed diet, exercise, wt reducing strategies...     GI & GU stable on Protonix40 & Proscar5+Flomax0.4.Marland KitchenMarland Kitchen    He has DJD, LBP,  and fell 2/15 w/ skull fx & subdural, neuropathy> he saw DrNudelman w/ rec for Aleve and exercise! Kirby Funk is retiring- on [employer] 7 notes that he hurts everyday.    Derm- DrDJones removed an inflamed cyst from his back... EXAM shows Afeb, VSS, O2sat=97% on RA;  HEENT- neg, mallampati2;  Chest- clear w/o w/r/r;  Heart- RR w/o m/r/g;  Abd-  Soft, nontender, neg;  Ext- neg w/o c/c/e;  Neuro- neuropathy, no other focal abn... IMP/PLAN>>  [name] is stable, continue same meds, we plan ROV in 80mo...   ~  October 19, 2015:  [name] & add-on appt trequested for dizziness> pt c/o balance off, says it started 10/08/15- just awoke dizzy & thought it was prob recurrent vertigo like in 2015; he took OTC [place] & a nurse at his church demonstrated the Epley maneuvers; he improved enough to play a round of golf- walked 18 holes- but noticed he was worse again the next day. Noted that when he rolled over in bed he was very dizzy; he called ENT- DrShoemaker and was told to call us; pt denies n/v, denies f/c/s, he has been walking w/ wife's walker for balance/support; he has mult other somatic complaints...     EXAM shows Afeb, VSS, no nystagmus, O2sat=98% on RA;  HEENT- neg, ears clear, mallampati1;  Chest- clear w/o w/r/r;  Heart- RR w/o m/r/g;  Neuro- appears intact w/o focal abn... IMP/PLAN>>  [name] is concerned that the fall, skull fx, subdural that he had Feb2015 could be causing this- we can't get MRI due to cardac pacer, therefore we will check CT Head;  In the interim he will Rx w/ Antivert 25mg  tid prn and do the epley maneuvers Q4-6H; we discussed Neurology eval if symptoms persist...  ADDENDUM>>  CT Head 10/24/15 showed no acute changes- w/o infarct, hemmorhage, mass, etc; there were some chr changes w/ atrophy 7 small vessel dis, no abn enhancement, prev subdural resolved...  ~  January 23, 2016:  15mo ROV & [name] has recovered fully from prev dizziness episode and no further repercussions from the fall/ skull  fx/ subdural that he had in 2015;  He reports incr prob w/ arthritis- this time in his right knee, pain occurred after bowling, no obvious trauma, etc;  He saw [place] w/ arthritis, poss a sl tear he said & treated conserv w/ shot & his Naprosyn (both helped);  He reports active- golfs 3d/wk, walking some, REC to add silver sneakers!  We reviewed the following medical problems during today's office visit >>     AR> on Claritin & Flonase- note 10% eos on CBC in past; we referred him for hearing eval- he wanted [facility] lab...    OSA> prev followed by DrClance- last seen 2013, he uses [employer], new machine in 2013, stable on CPAP,  no recent download available & we will try to obtain...    HBP> on ASA81, Cozaar100 & Amlod5; BP= 114/68 & he denies CP, palpit, SOB, edema; DrKlein prev stopped his Hct due to dizziness......    HxSyncope, sinus node dysfunction, Pacer> followed by DrKlein- last seen 06/2015, he does telechecks, pacer function normally, no issues ident...    Hyperlipidemia>  Chol has been good but TG elev in past; on Fish Oil supplements;  FLP 6/17 showed TChol 154, TG 213, HDL 45, LDL 83 & rec better low fat diet, get wt down.    BorderlineDM, overweight> on diet alone; weight is down 4# to 209# today & he needs to do better; Labs 6/17 showed BS=102, A1c=6.1; we reviewed diet, exercise, wt reduction...     GI- GERD, Barrett's, Divertics, IBS, colon polyp> on Pantoprazole40; known [place], HxBarrett's, prev Rx by DrWeissman & now DrPyrtle at [facility] w/ EGD 11/14 showing gastritis, esophagitis, gastric polyps (path=+Barretts, reflux related injury, neg HPylori) & f/u EGD planned 41yrs;  Colonoscopy 11/14 showing mod divertics, & 45mm polyp in cecum (tub adenoma)=> f/u 70yrs if eligible.    GU- BPH> on Flomax0.4 & Proscar5; followed by DrRDavis=> DrEskridge, last seen 03/2015... Asked to take meds regularly.    DJD, LBP> on Naprosyn, Tramadol, Tylenol; c/o hands and ankles stiff but he still plays golf;  eval by Kirby Funk & he released him "I've done all I can do for you"    Fall 2/15 w/ nondisplaced skull fx and subdural hematoma> treated conservatively, he had some resid HA, dizzy, etc & treated w/ Epley maneuvers- improved...    Periph neuropathy> thorough eval 2011 by DrReynolds & Hickling- idiopathic; 2nd opinion DrAquino- min discomfort & not on specific therapy for this...    Anxious> aware, not on meds... EXAM shows Afeb, VSS, no nystagmus, O2sat=98% on RA;  HEENT- neg, ears clear, mallampati1;  Chest- clear w/o w/r/r;  Heart- RR w/o m/r/g;  Neuro- appears intact w/o focal abn...  LABS 01/23/16:  FLP- Chol ok but TG=213 & needs better diet;  Chems- wnl w/ BS=102, A1c=6.1;  CBC- wnl x 13% eos;  TSH=1.65 IMP/PLAN>>  Lucious is +-stable on his current regimen- continue same, needs better diet, get wt down, etc;  We will try to get CPAP download;  ROV in 95mo...           Problem List:   OBSTRUCTIVE SLEEP APNEA (ICD-327.23) - evaluation, diagnosis, and Rx by the [facility] (their notes scanned into the EMR):  he had PSG 4/08 w/ AHI= 26, desat to 83%... placed on CPAP at 11cm H20 pressure... he also has some RLS & was on Requip transiently from the [place]. ~  4/11: pt indicates that he uses his CPAP 4/7 nights for 6-7 hr/night; he feels that he rests well, denies daytime hypersom, snoring, etc... he requests Sleep consult here in [place] (see DrClance consult). ~  5/13: he saw DrClance for Sleep f/u> noted 10# wt gain, pt reported using CPAP compliantly w/ good daytime alertness, no issues w/ mask but requesting new machine ([employer]) as his is >86yrs old; requested to work on wt reduction... ~  6/15: his eval & initial therapy was done by the [facility]; followed by DrClance w/ good compliance & no issues w/ sleep or daytime hypersomnolence.  ALLERGIC RHINITIS (ICD-477.9) - controlled on Claritin, and OTC meds... ~  12/15: we discussed trial of Antihist, Flonase, nasal saline to help his smell & taste...    HYPERTENSION (ICD-401.9) >>  ~  on ASA 81mg /d & HCTZ 25mg /d...  ~  CXR 4/11 is clear and WNL, NAD... ~  6/12:  BP=122/82 today and he denies HA, fatigue, visual changes, CP, palipit, dizziness, syncope, dyspnea, edema, etc... ~  6/13:  BP= 142/98 & he admits intermittently elev at home; we discussed change med to LOSARTAN/ HCT 100/12.5 daily; he will monitor BP & f/u here... ~  11/13:  BP= 132/88 & similar at home; denies CP, palpit, SOB, edema... ~  6/14:  BP= 126/84 & he remains asymptomatic... ~  6/15:  on ASA81, Cozaar100 & Amlod5; BP= 132/88 & he denies CP, palpit, SOB, edema; DrKlein stopped his Hct due to dizziness. ~  12/15: on ASA81, Cozaar100 & Amlod5; BP= 120/84 & he remains essentially asymptomatic... ~  6/16:  on ASA81, Cozaar100 & Amlod5; BP= 142/78 & he denies CP, palpit, SOB, edema; DrKlein prev stopped his Hct due to dizziness. ~  6/17: on ASA81, Cozaar100 & Amlod5; BP= 114/68 & he denies CP, palpit, SOB, edema; DrKlein prev stopped his Hct due to dizziness  Hx of SYNCOPE (ICD-780.2) & CARDIAC PACEMAKER IN SITU (ICD-V45.01) - he had pacer changed 11/08 DrKlein (hosp DC Summary reviewed)... he continues regular pacer checks and f/u w/ DrKlein...  ~  cath 3/99 DrJoeLeB showed norm coronaries, norm LV, norm Ao... ~  He saw DrKlein 12/11 for f/u syncope secondary to sinus node dysfunction, treated w/ pacer & generator replaced 2008;  He noted intercurrent syncope that was felt to be vasovagal in origin (this occurred in the Cards clinic 8/11 while waiting for his wife, & he has a long hx of this from blood draws over the yrs) & DrKlein reprogramed his device; EKG w/ SBrady & otherw WNL... ~  He saw DrKlein for Cards/EP f/u 12/12> hx syncope related to [facility] dysfunction- devise working properly & no changes made to the programming; TSH & sed rate wetre checked and WNL... ~  Yearly f/u DrKlein 12/13> doing satis w/o intercurrent syncope, pacer working satis & f/u planned 88yr... ~   12/14: he had yearly ROV w/ DrKlein, doing satis & pacer OK, no changes made... ~  He continues regular f/u w/ DrKlein & notes reviewed...  DIABETES MELLITUS, BORDERLINE (ICD-790.29) - he's been concerned about his BS due to [facility] and his neuropathy symptoms, but his BS has only been borderline elvated prev...  ~  labs 4/10 (wt=209#) showed BS= 113, A1c= 6.3.Marland Kitchen. rec> better diet, get wt down. ~  labs 4/11 (wt=222#) showed BS= 109, A1c= 6.2.Marland Kitchen. no meds yet, just needs to get wt down. ~  Labs 6/12 (wt=204#) showed BS= 102, A1v= 6.4 ~  Labs 6/13 (wt=213#) showed BS= 105, A1c= 6.4 ~  Labs 6/14 showed BS= 114, A1c= 6.6 ~  Labs 6/15 showed BS= 121, A1c= 6.4 ~  Labs remain under good control on diet alone...  OVERWEIGHT (ICD-278.02) - we discussed diet + exercise therapy (again)... ~  weight 4/09 = 226#.Marland KitchenMarland Kitchen 5" 9" Tall for a BMI= 33-34 ~  weight 4/10 = 209#... BMI down to 31... keep up the good work. ~  weight 4/11 = 222# .Marland KitchenMarland Kitchen BMI up to 33. ~  Weight 6/12 = 204# ~  Weight 6/13 = 213# ~  Weight 6/14 = 214# ~  Weight 6/15 = 218# ~  Weight 12/15 = 202# keep up the good work... ~  Encouraged re Diet/ Exercise/ etc...  GASTROESOPHAGEAL REFLUX DISEASE (ICD-530.81) w/ BARRETT'S ESOPH - followed by DrWeissman & Rx w/ OMEPRAZOLE 20mg Bid... ~  prev  EGD's documented a Dawn & Barrett's esoph... (last 1/03 by DrPatterson). ~  last EGD was 2/08 showing Barrett's epithelium in lower 1/3 of esoph... f/u planned 53yrs. ~  4/11:  NOTE> f/u EGD is due, he will decide on gastroenterologist ==> he saw DrMJohnson w/ EGD 5/11 showing Irreg Z-line & bx c/w Barrett's esoph (no dysplasia), the rest of the esoph, stomach, & duodenum were WNL... ~  EGD 11/14 by DrPyrtle showed gastritis, esophagitis, gastric polyps (path=+Barretts, reflux related injury, neg HPylori)... F/u planned 3 yrs.  DIVERTICULOSIS OF COLON (ICD-562.10)  IRRITABLE BOWEL SYNDROME (ICD-564.1) ~  last colonoscopy 2/08 by DrWeissman showed left sided  divertics, otherw neg... f/u planned 21yrs... ~  Colonoscopy 11/14 by DrPyrtle showed mod divertics, & 45mm polyp in cecum (tub adenoma)... F/u planned 5 yrs if eligible.  BENIGN PROSTATIC HYPERTROPHY, WITH OBSTRUCTION (ICD-600.01) - on FLOMAX 0.4mg /d & PROSCAR 5mg /d... eval and Rx by [facility] who checks pt q12mo (we don't have notes from him)... pt tells me he will be seeing DrDavis regularly when he moves to [facility] ~  9/13: pt reports that he is now seeing DrEskridge at [facility] & his PSAs have all been normal; his major prob has been holding his urine & eval revealed not emptying completely- they have tried several meds to help this; he will have notes sent to [place] for [facility] records.  OSTEOARTHRITIS (ICD-715.90) >> BACK PAIN (ICD-724.5) >>  ~  managed by DrAnderson & difficult problem, thought to have an inflamm arthropathy (?variation of psoriatic arthritis) however he did not respond to NSAIDs/Celebrex, Pred, MTX, anti-TNF inhibitor; on Tramadol prn;  ~  pt saw DrBeane 6/10 for eval> neuropathy symptoms & lumbar spondylosis on XRays, Rx Mobic Prn... offered Myelogram for further eval... he also takes Calcium & Vit D. ~  He was evaluated by Kirby Funk for Rheum => on Naprosyn, Tylenol; c/o hands and ankles stiff but he still plays golf etc; states that they released him "I've done all I can do for you, if you had RA I could help you"  FALL w/ SUBDURAL HEMATOMA >> treated conservatively, he had some resid HA, dizzy, etc & treated w/ Epley maneuvers- improved CT Head 4/15 showed mild atrophy & sm vessel dis & scat lacunes, prev left parietal extra-axial fluid collection is resolved...   PERIPHERAL NEUROPATHY (ICD-356.9) - DrReynolds evaluated him w/ NCV's etc and dx a peripheral neuropathy... this is one of his CC> numbness, decr sensation in feet, (no pain) w/ some assoc balance problems intermittently...  he tried Mentanx but no benefit...  ~  4/11: I reviewed DrReynolds 11/10 note w/ the  patient... ~  He also had a thorough neurologic evaluation 10/11 from one of his church physicians, DrHickling, for f/u polyneuropathy> balance issues, LBP, gait abn, variable paresthesias, foot numbness (sl decr vibration & pin prick), some subjective memory problems w/ norm MMSE, abnormal NCVs;  Prev Labs were all WNL, unrevealing;  etiology of his neuropathy is unknown= cryptogenic, and since it isn't painful he really didn't require any therapy... ~  6/13: he continues to experience prob w/ balance etc related to his neuropathy; not progressive & still not painful; also notes nocturnal leg cramps & he's tried bar of soap trick, mustard, etc- all w/o relief (we rec trial Tonic water)... ~  2015> Neuro follow up by DrAquino for [facility]...  Health Maintenance - he takes a number of Vits + lecithin, saw palmetto, Fish Oil, etc... he saw DrDJones in 2010 for Derm review- pt  reports nothing major found...   Past Surgical History  Procedure Laterality Date  . Appendectomy    . Cholecystectomy    . Tonsillectomy    . Pacemaker placement    . Skin cancer removed  12/2012    removed from his face--Dr. Sarajane Jews    Outpatient Encounter Prescriptions as of 01/23/2016  Medication Sig  . acetaminophen (TYLENOL) 500 MG tablet Take 1,000 mg by mouth every 6 (six) hours as needed for pain.  Marland Kitchen amLODipine (NORVASC) 5 MG tablet Take 1 tablet by mouth  daily  . aspirin 81 MG tablet Take 81 mg by mouth daily.  . calcium carbonate (OS-CAL) 600 MG TABS Take 600 mg by mouth daily.    . Cholecalciferol (VITAMIN D) 1000 UNITS capsule Take 1,000 Units by mouth daily.    . finasteride (PROSCAR) 5 MG tablet Take 1 tablet by mouth daily.  . Lecithin 1200 MG CAPS Take 1 capsule by mouth daily.    Marland Kitchen loratadine (CLARITIN) 10 MG tablet Take 10 mg by mouth daily.    Marland Kitchen losartan (COZAAR) 100 MG tablet Take 1 tablet by mouth  daily  . meclizine (ANTIVERT) 25 MG tablet Take 1 tablet (25 mg total) by mouth 3 (three) times  daily.  . Multiple Vitamin (MULTIVITAMIN) capsule Take 1 capsule by mouth daily.    . naproxen (NAPROSYN) 500 MG tablet Take 1 tablet (500 mg total) by mouth 2 (two) times daily.  . Omega-3 Fatty Acids (DIALYVITE OMEGA-3 CONCENTRATE) 600 MG CAPS Take 1 capsule by mouth daily.  . pantoprazole (PROTONIX) 40 MG tablet Take 1 tablet by mouth  daily  . Tamsulosin HCl (FLOMAX) 0.4 MG CAPS Take 0.4 mg by mouth daily.    . traMADol (ULTRAM) 50 MG tablet Take 1 tablet (50 mg total) by mouth 3 (three) times daily.   No facility-administered encounter medications on file as of 01/23/2016.    Allergies  Allergen Reactions  . Shellfish Allergy     Sensitive to shellfish    Current Medications, Allergies, Past Medical History, Past Surgical History, Family History, and Social History were reviewed in [employer] record.   Review of Systems        The patient complains of sleep disorder, dyspnea on exertion, gas/bloating, joint pain, stiffness, arthritis, paresthesias, and difficulty walking.  The patient denies fever, chills, sweats, anorexia, fatigue, weakness, malaise, weight loss, blurring, diplopia, eye irritation, eye discharge, vision loss, eye pain, photophobia, earache, ear discharge, tinnitus, decreased hearing, nasal congestion, nosebleeds, sore throat, hoarseness, chest pain, palpitations, syncope, orthopnea, PND, peripheral edema, cough, dyspnea at rest, excessive sputum, hemoptysis, wheezing, pleurisy, nausea, vomiting, diarrhea, constipation, change in bowel habits, abdominal pain, melena, hematochezia, jaundice, indigestion/heartburn, dysphagia, odynophagia, dysuria, hematuria, urinary frequency, urinary hesitancy, nocturia, incontinence, back pain, joint swelling, muscle cramps, muscle weakness, sciatica, restless legs, leg pain at night, leg pain with exertion, rash, itching, dryness, suspicious lesions, paralysis, seizures, tremors, vertigo, transient blindness,  frequent falls, frequent headaches, depression, anxiety, memory loss, confusion, cold intolerance, heat intolerance, polydipsia, polyphagia, polyuria, unusual weight change, abnormal bruising, bleeding, enlarged lymph nodes, urticaria, allergic rash, hay fever, and recurrent infections.     Objective:   Physical Exam     WD, WN, 82 y/o WM in NAD... GENERAL:  Alert & oriented; pleasant & cooperative... HEENT:  /AT, EOM-wnl, PERRLA, EACs-clear, TMs-wnl, NOSE-clear, THROAT-clear & wnl. NECK:  Supple w/ fairROM; no JVD; normal carotid impulses w/o bruits; no thyromegaly or nodules palpated; no lymphadenopathy. CHEST:  Clear  to P & A; without wheezes/ rales/ or rhonchi. HEART:  Regular Rhythm; without murmurs/ rubs/ or gallops; pacer in left shoulder area... ABDOMEN:  Soft & nontender; normal bowel sounds; no organomegaly or masses detected. EXT: without deformities, mild arthritic changes; no varicose veins/ +venous insuffic/ no edema. NEURO:  CN's intact;  fair gait;  Motor normal, sensory variable deficits... strength seems OK, min decr sensation in LEs. DERM:  No lesions noted; no rash etc...  RADIOLOGY DATA:  Reviewed in the EPIC EMR & discussed w/ the patient...  LABORATORY DATA:  Reviewed in the EPIC EMR & discussed w/ the patient...   Assessment & Plan:    OSA>  Followed by DrClance & stable on CPAP; we will try to get a download thru [employer]...  HBP>  His BP is improved on Cozaar & Amlodipine, continue same...  Hx Syncope/ Pacer>  Followed by DrKlein & pacer functioning normally...  DM, borderline>  On diet alone & managing well, A1c remains 6.1 & we reviewed the import of wt reduction...  GERD, Hx Barrett's>  S/p EGD from DrPyrtle w/ small segment Barrett's mucosa, no dysplasia...  Divertics, IBS>  Colonoscopy 11/14 w/ 76mm tub adenoma removed...  BPH>  Followed by [facility]Eskridge at [facility]; biggest issue is control & he says it was from not emptying completely, they  have tried several meds and continue to follow up regularly, he reports PSAs have been ok; he will get notes to [place]...  DJD>  Followed by Kirby Funk & still having difficulty w/ pain & stiffness...  Periph Neuropathy>  Eval by Neuro, cryptogenic, no meds needed & symptoms remain the same...   Patient's Medications  New Prescriptions   No medications on file  Previous Medications   ACETAMINOPHEN (TYLENOL) 500 MG TABLET    Take 1,000 mg by mouth every 6 (six) hours as needed for pain.   AMLODIPINE (NORVASC) 5 MG TABLET    Take 1 tablet by mouth  daily   ASPIRIN 81 MG TABLET    Take 81 mg by mouth daily.   CALCIUM CARBONATE (OS-CAL) 600 MG TABS    Take 600 mg by mouth daily.     CHOLECALCIFEROL (VITAMIN D) 1000 UNITS CAPSULE    Take 1,000 Units by mouth daily.     FINASTERIDE (PROSCAR) 5 MG TABLET    Take 1 tablet by mouth daily.   LECITHIN 1200 MG CAPS    Take 1 capsule by mouth daily.     LORATADINE (CLARITIN) 10 MG TABLET    Take 10 mg by mouth daily.     LOSARTAN (COZAAR) 100 MG TABLET    Take 1 tablet by mouth  daily   MECLIZINE (ANTIVERT) 25 MG TABLET    Take 1 tablet (25 mg total) by mouth 3 (three) times daily.   MULTIPLE VITAMIN (MULTIVITAMIN) CAPSULE    Take 1 capsule by mouth daily.     NAPROXEN (NAPROSYN) 500 MG TABLET    Take 1 tablet (500 mg total) by mouth 2 (two) times daily.   OMEGA-3 FATTY ACIDS (DIALYVITE OMEGA-3 CONCENTRATE) 600 MG CAPS    Take 1 capsule by mouth daily.   PANTOPRAZOLE (PROTONIX) 40 MG TABLET    Take 1 tablet by mouth  daily   TAMSULOSIN HCL (FLOMAX) 0.4 MG CAPS    Take 0.4 mg by mouth daily.     TRAMADOL (ULTRAM) 50 MG TABLET    Take 1 tablet (50 mg total) by mouth 3 (three) times daily.  Modified Medications  No medications on file  Discontinued Medications   No medications on file

## 2016-01-23 NOTE — Patient Instructions (Signed)
Today we updated your med list in our EPIC system...    Continue your current medications the same...  Today we checked your FASTING blood work...    We will contact you w/ the results when available...   Keep up the good work w/ diet & exercise...  Call for any questions...  Let's plan a follow up visit in 10mo, sooner if needed for problems.Marland KitchenMarland Kitchen

## 2016-01-24 DIAGNOSIS — H903 Sensorineural hearing loss, bilateral: Secondary | ICD-10-CM | POA: Diagnosis not present

## 2016-02-05 DIAGNOSIS — R69 Illness, unspecified: Secondary | ICD-10-CM | POA: Diagnosis not present

## 2016-02-08 ENCOUNTER — Other Ambulatory Visit: Payer: Self-pay | Admitting: Pulmonary Disease

## 2016-02-08 DIAGNOSIS — G4733 Obstructive sleep apnea (adult) (pediatric): Secondary | ICD-10-CM

## 2016-02-23 ENCOUNTER — Other Ambulatory Visit: Payer: Self-pay | Admitting: Pulmonary Disease

## 2016-03-11 ENCOUNTER — Encounter: Payer: Self-pay | Admitting: *Deleted

## 2016-04-04 ENCOUNTER — Encounter: Payer: Self-pay | Admitting: Physician Assistant

## 2016-04-04 NOTE — Progress Notes (Signed)
Cardiology Office Note Date:  04/05/2016  Patient ID:  Tony Cruz, DOB 1934/03/13, MRN [id number] PCP:  [name], MD  Cardiologist:  [name] Pulmonology: Dr. Lenna Gilford    Chief Complaint: routine device/office visit  History of Present Illness: Tony Cruz is a 82 y.o. male with history of sinus node dysfunction w/PPM, syncope thought to be vasovagal/post prandial (rate response was programmed on), HTN, OA, comes to the office today to be seen for [name].  He reports feeling well over all, though for the last 2 weeks or so he has been getting a discomfort across his chest that at first he was concerned may have been his device/pocket, he first felt it after doing some floor exercises and worried he may have pulled it out of place though has not been tender, but seemed to be in the general area of his device, since then though more across his left chest, it is not position or exertional, he is an avid golfer, reports that he walks the course, this does not seem to provoke it either, it is somewhat random, he has difficulty describing it, no associated symptoms, no SOB or weakness, just comes and goes.  It does not strike him as his usual "GERD" but perhaps has a burning quality.    He denies any kind of near syncope or syncope.  No changes to his exertional capacity, has not felt any palpitations.   Device information: MDT dual chamber PPM, implanted 06/19/2007, [name], Sick sinus   Past Medical History:  Diagnosis Date  . Allergy   . Anal fissure   . Backache, unspecified   . Diverticulosis of colon (without mention of hemorrhage)   . Esophageal reflux   . hypertension   . Hypertrophy of prostate with urinary obstruction and other lower urinary tract symptoms (LUTS)   . Irritable bowel syndrome   . Obstructive sleep apnea   . Osteoarthrosis, unspecified whether generalized or localized, unspecified site   . Other abnormal glucose   . Overweight(278.02)   .  Pacemaker-MDT    Change out 2008  . Syncope and collapse   . Unspecified hereditary and idiopathic peripheral neuropathy     Past Surgical History:  Procedure Laterality Date  . APPENDECTOMY    . CHOLECYSTECTOMY    . PACEMAKER PLACEMENT    . skin cancer removed  12/2012   removed from his face--Dr. Sarajane Jews  . TONSILLECTOMY      Current Outpatient Prescriptions  Medication Sig Dispense Refill  . acetaminophen (TYLENOL) 500 MG tablet Take 1,000 mg by mouth every 6 (six) hours as needed for pain.    Marland Kitchen amLODipine (NORVASC) 5 MG tablet Take 1 tablet by mouth  daily 90 tablet 2  . aspirin 81 MG tablet Take 81 mg by mouth daily.    . calcium carbonate (OS-CAL) 600 MG TABS Take 600 mg by mouth daily.      . Cholecalciferol (VITAMIN D) 1000 UNITS capsule Take 1,000 Units by mouth daily.      . finasteride (PROSCAR) 5 MG tablet Take 1 tablet by mouth daily.    . Lecithin 1200 MG CAPS Take 1 capsule by mouth daily.      Marland Kitchen loratadine (CLARITIN) 10 MG tablet Take 10 mg by mouth daily.      Marland Kitchen losartan (COZAAR) 100 MG tablet Take 1 tablet by mouth  daily 90 tablet 2  . meclizine (ANTIVERT) 25 MG tablet Take 25 mg by mouth daily as needed  for dizziness.    . Multiple Vitamin (MULTIVITAMIN) capsule Take 1 capsule by mouth daily.      . naproxen (NAPROSYN) 500 MG tablet Take 1 tablet (500 mg total) by mouth 2 (two) times daily. 60 tablet 2  . Omega-3 Fatty Acids (DIALYVITE OMEGA-3 CONCENTRATE) 600 MG CAPS Take 1 capsule by mouth daily.    . pantoprazole (PROTONIX) 40 MG tablet TAKE ONE TABLET DAILY 90 tablet 1  . Tamsulosin HCl (FLOMAX) 0.4 MG CAPS Take 0.4 mg by mouth daily.      . traMADol (ULTRAM) 50 MG tablet Take 1 tablet (50 mg total) by mouth 3 (three) times daily. 270 tablet 1   No current facility-administered medications for this visit.     Allergies:   Shellfish allergy   Social History:  The patient  reports that he has never smoked. He has never used smokeless tobacco. He reports  that he drinks alcohol. He reports that he does not use drugs.   Family History:  The patient's family history includes Coronary artery disease in his father and mother; Diabetes in his sister.  ROS:  Please see the history of present illness. All other systems are reviewed and otherwise negative.   PHYSICAL EXAM:  VS:  BP 138/74   Pulse 73   Ht 5\' 9"  (1.753 m)   Wt 215 lb (97.5 kg)   BMI 31.75 kg/m  BMI: Body mass index is 31.75 kg/m. Well nourished, well developed, in no acute distress  HEENT: normocephalic, atraumatic  Neck: no JVD, carotid bruits or masses Cardiac:  RRR; no significant murmurs, no rubs, or gallops Lungs:  clear to auscultation bilaterally, no wheezing, rhonchi or rales  Abd: soft, nontender MS: no deformity or atrophy Ext: no edema  Skin: warm and dry, no rash Neuro:  No gross deficits appreciated Psych: euthymic mood, full affect  PPM site is stable, no tethering or discomfort   EKG:  Done today and reviewed by myself shows SR, RBBB, appears unchanged from previous, nno ischemic looking changes PPM interrogation today: normal device function, 99% As/VS, one HVR, likely NSVT 19beats, battery status estimates 74months  Recent Labs: 01/23/2016: ALT 30; BUN 14; Creatinine, Ser 0.90; Hemoglobin 13.8; Platelets 285.0; Potassium 4.4; Sodium 137; TSH 1.65  01/23/2016: Cholesterol 154; Direct LDL 83.0; HDL 45.20; Total CHOL/HDL Ratio 3; Triglycerides 213.0; VLDL 42.6   CrCl cannot be calculated (Patient's most recent lab result is older than the maximum 21 days allowed.).   Wt Readings from Last 3 Encounters:  04/05/16 215 lb (97.5 kg)  01/23/16 209 lb 6.4 oz (95 kg)  10/19/15 212 lb 12.8 oz (96.5 kg)     Other studies reviewed: Additional studies/records reviewed today include: summarized above  ASSESSMENT AND PLAN:  1. Sick sinus, sinus node dysfunction w/PPM     normal device function     One high V rate, appears to be NSVT  2. Hx of vasgvagal  syncope     No reports of recurrent syncope  3. HTN     stable  4. CP     Somewhat atypical sounding     He does have some CV risk with age, HTN, and family hx, discussed getting stress test, he is agreeable to ETT, though no chemicals, he rarely paces, should be Ok with ETT   We will schedule him for an echo doppler and ETT   Disposition: F/u with [name] as already scheduled in Dec, pending his test results if needed sooner.  Current medicines are reviewed at length with the patient today.  The patient did not have any concerns regarding medicines.  Haywood Lasso, PA-C 04/05/2016 9:20 AM     [facility] [address] [place] [address] [phone] (office)  [phone] (fax)

## 2016-04-05 ENCOUNTER — Encounter: Payer: Self-pay | Admitting: Physician Assistant

## 2016-04-05 ENCOUNTER — Ambulatory Visit (INDEPENDENT_AMBULATORY_CARE_PROVIDER_SITE_OTHER): Payer: Medicare HMO | Admitting: Physician Assistant

## 2016-04-05 VITALS — BP 138/74 | HR 73 | Ht 69.0 in | Wt 215.0 lb

## 2016-04-05 DIAGNOSIS — I495 Sick sinus syndrome: Secondary | ICD-10-CM | POA: Diagnosis not present

## 2016-04-05 DIAGNOSIS — I10 Essential (primary) hypertension: Secondary | ICD-10-CM

## 2016-04-05 DIAGNOSIS — R0789 Other chest pain: Secondary | ICD-10-CM | POA: Diagnosis not present

## 2016-04-05 DIAGNOSIS — R0782 Intercostal pain: Secondary | ICD-10-CM | POA: Diagnosis not present

## 2016-04-05 DIAGNOSIS — R55 Syncope and collapse: Secondary | ICD-10-CM

## 2016-04-05 LAB — CUP PACEART INCLINIC DEVICE CHECK
Battery Impedance: 3826 Ohm
Battery Remaining Longevity: 12 mo
Brady Statistic AP VP Percent: 0 %
Brady Statistic AS VS Percent: 100 %
Implantable Lead Location: 753860
Implantable Lead Model: 5092
Lead Channel Impedance Value: 765 Ohm
Lead Channel Pacing Threshold Amplitude: 0.75 V
Lead Channel Pacing Threshold Pulse Width: 0.4 ms
Lead Channel Pacing Threshold Pulse Width: 0.4 ms
Lead Channel Pacing Threshold Pulse Width: 0.4 ms
Lead Channel Pacing Threshold Pulse Width: 0.4 ms
Lead Channel Sensing Intrinsic Amplitude: 1.4 mV
Lead Channel Setting Sensing Sensitivity: 2 mV
MDC IDC LEAD IMPLANT DT: 19990330
MDC IDC LEAD IMPLANT DT: 19990330
MDC IDC LEAD LOCATION: 753859
MDC IDC LEAD MODEL: 5068
MDC IDC MSMT BATTERY VOLTAGE: 2.67 V
MDC IDC MSMT LEADCHNL RA IMPEDANCE VALUE: 705 Ohm
MDC IDC MSMT LEADCHNL RA PACING THRESHOLD AMPLITUDE: 0.625 V
MDC IDC MSMT LEADCHNL RV PACING THRESHOLD AMPLITUDE: 0.75 V
MDC IDC MSMT LEADCHNL RV PACING THRESHOLD AMPLITUDE: 0.75 V
MDC IDC MSMT LEADCHNL RV SENSING INTR AMPL: 4 mV
MDC IDC SESS DTM: 20170908091548
MDC IDC SET LEADCHNL RA PACING AMPLITUDE: 2 V
MDC IDC SET LEADCHNL RV PACING AMPLITUDE: 2.5 V
MDC IDC SET LEADCHNL RV PACING PULSEWIDTH: 0.4 ms
MDC IDC STAT BRADY AP VS PERCENT: 0 %
MDC IDC STAT BRADY AS VP PERCENT: 0 %

## 2016-04-05 NOTE — Patient Instructions (Signed)
Medication Instructions:   Your physician recommends that you continue on your current medications as directed. Please refer to the Current Medication list given to you today.  If you need a refill on your cardiac medications before your next appointment, please call your pharmacy.  Labwork: NONE ORDER TODAY    Testing/Procedures: Your physician has requested that you have an echocardiogram. Echocardiography is a painless test that uses sound waves to create images of your heart. It provides your doctor with information about the size and shape of your heart and how well your heart's chambers and valves are working. This procedure takes approximately one hour. There are no restrictions for this procedure.  Your physician has requested that you have an exercise tolerance test. For further information please visit [email or web address]. Please also follow instruction sheet, as given.    Follow-Up: AS ALREADY SCHEDULED WITH KLEIN   Any Other Special Instructions Will Be Listed Below (If Applicable).

## 2016-04-10 ENCOUNTER — Encounter: Payer: Self-pay | Admitting: Internal Medicine

## 2016-04-18 ENCOUNTER — Encounter: Payer: Self-pay | Admitting: Physician Assistant

## 2016-04-18 ENCOUNTER — Ambulatory Visit (INDEPENDENT_AMBULATORY_CARE_PROVIDER_SITE_OTHER): Payer: Medicare HMO | Admitting: Physician Assistant

## 2016-04-18 ENCOUNTER — Telehealth: Payer: Self-pay | Admitting: Physician Assistant

## 2016-04-18 ENCOUNTER — Telehealth: Payer: Self-pay | Admitting: Pulmonary Disease

## 2016-04-18 VITALS — BP 124/72 | HR 98 | Temp 98.4°F | Resp 16 | Ht 67.5 in | Wt 211.0 lb

## 2016-04-18 DIAGNOSIS — L03811 Cellulitis of head [any part, except face]: Secondary | ICD-10-CM

## 2016-04-18 DIAGNOSIS — R21 Rash and other nonspecific skin eruption: Secondary | ICD-10-CM

## 2016-04-18 DIAGNOSIS — R69 Illness, unspecified: Secondary | ICD-10-CM | POA: Diagnosis not present

## 2016-04-18 MED ORDER — VALACYCLOVIR HCL 1 G PO TABS: 1000.0000 mg | ORAL_TABLET | Freq: Three times a day (TID) | ORAL | 0 refills | Status: AC

## 2016-04-18 MED ORDER — VALACYCLOVIR HCL 1 G PO TABS: 1000.0000 mg | ORAL_TABLET | Freq: Three times a day (TID) | ORAL | 0 refills | Status: DC

## 2016-04-18 MED ORDER — DOXYCYCLINE HYCLATE 100 MG PO CAPS: 100.0000 mg | ORAL_CAPSULE | Freq: Two times a day (BID) | ORAL | 0 refills | Status: DC

## 2016-04-18 NOTE — Patient Instructions (Addendum)
-Follow up in two days for reevaluation -Seek care sooner if you develop any worsening symptoms -Use warm compress to eye   Cellulitis Cellulitis is an infection of the skin and the tissue beneath it. The infected area is usually red and tender. Cellulitis occurs most often in the arms and lower legs.  CAUSES  Cellulitis is caused by bacteria that enter the skin through cracks or cuts in the skin. The most common types of bacteria that cause cellulitis are staphylococci and streptococci. SIGNS AND SYMPTOMS   Redness and warmth.  Swelling.  Tenderness or pain.  Fever. DIAGNOSIS  Your health care provider can usually determine what is wrong based on a physical exam. Blood tests may also be done. TREATMENT  Treatment usually involves taking an antibiotic medicine. HOME CARE INSTRUCTIONS   Take your antibiotic medicine as directed by your health care provider. Finish the antibiotic even if you start to feel better.  Keep the infected arm or leg elevated to reduce swelling.  Apply a warm cloth to the affected area up to 4 times per day to relieve pain.  Take medicines only as directed by your health care provider.  Keep all follow-up visits as directed by your health care provider. SEEK MEDICAL CARE IF:   You notice red streaks coming from the infected area.  Your red area gets larger or turns dark in color.  Your bone or joint underneath the infected area becomes painful after the skin has healed.  Your infection returns in the same area or another area.  You notice a swollen bump in the infected area.  You develop new symptoms.  You have a fever. SEEK IMMEDIATE MEDICAL CARE IF:   You feel very sleepy.  You develop vomiting or diarrhea.  You have a general ill feeling (malaise) with muscle aches and pains.   This information is not intended to replace advice given to you by your health care provider. Make sure you discuss any questions you have with your health care  provider.   Document Released: 04/24/2005 Document Revised: 04/05/2015 Document Reviewed: 09/30/2011 Elsevier Interactive Patient Education 2016 [place] is an infection that causes a painful skin rash and fluid-filled blisters. Shingles is caused by the same virus that causes chickenpox. Shingles only develops in people who:  Have had chickenpox.  Have gotten the chickenpox vaccine. (This is rare.) The first symptoms of shingles may be itching, tingling, or pain in an area on your skin. A rash will follow in a few days or weeks. The rash is usually on one side of the body in a bandlike or beltlike pattern. Over time, the rash turns into fluid-filled blisters that break open, scab over, and dry up. Medicines may:  Help you manage pain.  Help you recover more quickly.  Help to prevent long-term problems. HOME CARE Medicines  Take medicines only as told by your doctor.  Apply an anti-itch or numbing cream to the affected area as told by your doctor. Blister and Rash Care  Take a cool bath or put cool compresses on the area of the rash or blisters as told by your doctor. This may help with pain and itching.  Keep your rash covered with a loose bandage (dressing). Wear loose-fitting clothing.  Keep your rash and blisters clean with mild soap and cool water or as told by your doctor.  Check your rash every day for signs of infection. These include redness, swelling, and pain that lasts or gets  worse.  Do not pick your blisters.  Do not scratch your rash. General Instructions  Rest as told by your doctor.  Keep all follow-up visits as told by your doctor. This is important.  Until your blisters scab over, your infection can cause chickenpox in people who have never had it or been vaccinated against it. To prevent this from happening, avoid touching other people or being around other people, especially:  Babies.  Pregnant women.  Children who  have eczema.  Elderly people who have transplants.  People who have chronic illnesses, such as leukemia or AIDS. GET HELP IF:  Your pain does not get better with medicine.  Your pain does not get better after the rash heals.  Your rash looks infected. Signs of infection include:  Redness.  Swelling.  Pain that lasts or gets worse. GET HELP RIGHT AWAY IF:  The rash is on your face or nose.  You have pain in your face, pain around your eye area, or loss of feeling on one side of your face.  You have ear pain or you have ringing in your ear.  You have loss of taste.  Your condition gets worse.   This information is not intended to replace advice given to you by your health care provider. Make sure you discuss any questions you have with your health care provider.   Document Released: 01/01/2008 Document Revised: 08/05/2014 Document Reviewed: 04/26/2014 Elsevier Interactive Patient Education 2016 [employer].    IF you received an x-ray today, you will receive an invoice from [facility]. Please contact [facility] at [phone] with questions or concerns regarding your invoice.   IF you received labwork today, you will receive an invoice from [employer]. Please contact [facility] at [phone] with questions or concerns regarding your invoice.   Our billing staff will not be able to assist you with questions regarding bills from these companies.  You will be contacted with the lab results as soon as they are available. The fastest way to get your results is to activate your My Chart account. Instructions are located on the last page of this paperwork. If you have not heard from [place] regarding the results in 2 weeks, please contact this office.

## 2016-04-18 NOTE — Telephone Encounter (Signed)
This problem needs to be seen ASAP-- they have chosen to go to [facility] for eval... Asked to keep me informed of their assessment... SMN

## 2016-04-18 NOTE — Telephone Encounter (Signed)
Called and spoke with pt and he stated that SN recs that the pt take cortisone with the shingles medication. Pt stated that they started him on the valcyclovir and doxycycline for a spot that looked infected.    SN please advise if you want to prescribe the prednisone for the pt. thanks

## 2016-04-18 NOTE — Telephone Encounter (Signed)
Called spoke with pt spouse.  She reports pt is having terrible HA, has spot on forehead, facial swollen and eye is swollen. She is thinking pt has shingles. She is actually on the way to take pt to the [facility].  This is just [place]

## 2016-04-18 NOTE — Progress Notes (Signed)
KC MANCILLA  MRN: [id number] DOB: 08-28-1933  Subjective:  Tony Cruz is a 82 y.o. male seen in office today for a chief complaint of rash on scalp x one week. Pt ran into a tree limb one month ago and noticed a red spot with unbroken skin on his scalp at this time. He then stopped paying attendtion to it. Last week he noticed that his scalp was tender when he was combing his hair. He then noticed that the place where the bump was a month ago had scabbed over and was tender to the touch. Has associated redness and warmth surrounding scab.   He also notes that his left eyelid is swollen. Denies eyes discharge, blurred vision, and photophobia. Does note that both of his eyes have been more itchy and watery than normal these past two months due to allergies.     Pt also had a vesicle appear on his left thorax yesterday that is very itching and sensitive to the touch.   Of note, pt has had shingles 10 years ago despite having zostavax. Notes that the vesicle on his trunk is very similar to the time he had shingles.   Review of Systems  Constitutional: Negative for chills, diaphoresis and fever.  Cardiovascular: Negative for chest pain and palpitations.  Neurological: Negative for dizziness and weakness.    Patient Active Problem List   Diagnosis Date Noted  . History of skull fracture 10/19/2015  . History of subdural hematoma 10/19/2015  . Headache(784.0) 09/30/2013  . Sinoatrial node dysfunction (HCC) 06/01/2013  . Pacemaker-MDT 07/02/2011  . Night sweats 07/02/2011  . DIZZINESS 03/22/2010  . Impaired fasting glucose 11/02/2009  . Obstructive sleep apnea 11/21/2008  . Backache 11/03/2008  . Overweight 11/02/2007  . Essential hypertension 11/02/2007  . ALLERGIC RHINITIS 11/02/2007  . GASTROESOPHAGEAL REFLUX DISEASE 11/02/2007  . Diverticulosis of large intestine 11/02/2007  . BPH (benign prostatic hypertrophy) with urinary obstruction 11/02/2007  . SYNCOPE 11/02/2007  .  Hereditary and idiopathic peripheral neuropathy 10/30/2007  . IRRITABLE BOWEL SYNDROME 10/30/2007  . Osteoarthritis 10/30/2007    Current Outpatient Prescriptions on File Prior to Visit  Medication Sig Dispense Refill  . acetaminophen (TYLENOL) 500 MG tablet Take 1,000 mg by mouth every 6 (six) hours as needed for pain.    Marland Kitchen amLODipine (NORVASC) 5 MG tablet Take 1 tablet by mouth  daily 90 tablet 2  . aspirin 81 MG tablet Take 81 mg by mouth daily.    . calcium carbonate (OS-CAL) 600 MG TABS Take 600 mg by mouth daily.      . Cholecalciferol (VITAMIN D) 1000 UNITS capsule Take 1,000 Units by mouth daily.      . finasteride (PROSCAR) 5 MG tablet Take 1 tablet by mouth daily.    . Lecithin 1200 MG CAPS Take 1 capsule by mouth daily.      Marland Kitchen loratadine (CLARITIN) 10 MG tablet Take 10 mg by mouth daily.      Marland Kitchen losartan (COZAAR) 100 MG tablet Take 1 tablet by mouth  daily 90 tablet 2  . Multiple Vitamin (MULTIVITAMIN) capsule Take 1 capsule by mouth daily.      . naproxen (NAPROSYN) 500 MG tablet Take 1 tablet (500 mg total) by mouth 2 (two) times daily. 60 tablet 2  . Omega-3 Fatty Acids (DIALYVITE OMEGA-3 CONCENTRATE) 600 MG CAPS Take 1 capsule by mouth daily.    . pantoprazole (PROTONIX) 40 MG tablet TAKE ONE TABLET DAILY 90 tablet 1  .  Tamsulosin HCl (FLOMAX) 0.4 MG CAPS Take 0.4 mg by mouth daily.      . traMADol (ULTRAM) 50 MG tablet Take 1 tablet (50 mg total) by mouth 3 (three) times daily. 270 tablet 1   No current facility-administered medications on file prior to visit.     Allergies  Allergen Reactions  . Shellfish Allergy     Sensitive to shellfish   Social History   Social History  . Marital status: Married    Spouse name: Primary school teacher  . Number of children: N/A  . Years of education: N/A   Occupational History  . Retired   .  Retired   Social History Main Topics  . Smoking status: Never Smoker  . Smokeless tobacco: Never Used  . Alcohol use 0.0 oz/week      Comment: occasionally   . Drug use: No  . Sexual activity: No   Other Topics Concern  . Not on file   Social History Narrative  . No narrative on file   Family History  Problem Relation Age of Onset  . Coronary artery disease Father     bladder cancer  . Coronary artery disease Mother   . Diabetes Sister     2 sisters    Objective:  BP 124/72 (BP Location: Right Arm, Patient Position: Sitting, Cuff Size: Large)   Pulse 98   Temp 98.4 F (36.9 C) (Oral)   Resp 16   Ht 5' 7.5" (1.715 m)   Wt 211 lb (95.7 kg)   SpO2 96%   BMI 32.56 kg/m   Physical Exam  Constitutional: He is oriented to person, place, and time and well-developed, well-nourished, and in no distress.  HENT:  Head: Normocephalic and atraumatic.    Eyes: Conjunctivae and EOM are normal. Pupils are equal, round, and reactive to light.    Neck: Normal range of motion.  Pulmonary/Chest: Effort normal.  Neurological: He is alert and oriented to person, place, and time. Gait normal.  Skin: Skin is warm and dry.     Psychiatric: Affect normal.  Vitals reviewed.   Assessment and Plan :  1. Rash and nonspecific skin eruption -Shingles suspected, will treat accordingly  - valACYclovir (VALTREX) 1000 MG tablet; Take 1 tablet (1,000 mg total) by mouth 3 (three) times daily.  Dispense: 21 tablet; Refill: 0  2. Cellulitis of head except face - WOUND CULTURE- - doxycycline (VIBRAMYCIN) 100 MG capsule; Take 1 capsule (100 mg total) by mouth 2 (two) times daily.  Dispense: 20 capsule; Refill: 0 -Follow up on Monday for reevaluation.  -Seek care sooner if symptoms worsen or develop new concerning symptoms  Tenna Delaine PA-C  [facility] 04/18/2016 11:56 PM

## 2016-04-18 NOTE — Telephone Encounter (Signed)
New Message:    Pt just found out that he have shingles and an infection. He is scheduled to have a treadmill and echo tomorrow. He wants to know if you think the medicine that he was given for shingles would interfer with his test?Please call today and let him know if he should still take the test.

## 2016-04-18 NOTE — Telephone Encounter (Signed)
SPOKE  WITH   PT  AND  WE  WILL RESCHEDULE   ECHO AND STRESS TEST  DUE  TO NEW  DX  OF  SHINGLES .Adonis Housekeeper

## 2016-04-19 ENCOUNTER — Encounter: Payer: Self-pay | Admitting: Pulmonary Disease

## 2016-04-19 ENCOUNTER — Other Ambulatory Visit (HOSPITAL_COMMUNITY): Payer: Medicare HMO

## 2016-04-19 ENCOUNTER — Encounter: Payer: Self-pay | Admitting: Physician Assistant

## 2016-04-19 NOTE — Telephone Encounter (Signed)
TP,   Please advise for patient as SN is gone for the day. Thanks.

## 2016-04-19 NOTE — Telephone Encounter (Signed)
Yes please cover If draining . If has eye involvement needs to be seen by eye doctor .  Please contact office for sooner follow up if symptoms do not improve or worsen or seek emergency care

## 2016-04-19 NOTE — Telephone Encounter (Signed)
I spoke w/ Pt & wife>  He went to [facility] & felt to have Shingles left V1-- given Valtrex and Doxy;  He is using Tylenol for pain;  Rec to add MEDROL Dosepak & I called this into [facility] on [address] @ [employer]...  SMN

## 2016-04-21 LAB — WOUND CULTURE
Gram Stain: NONE SEEN
Gram Stain: NONE SEEN
Organism ID, Bacteria: NORMAL

## 2016-04-22 ENCOUNTER — Ambulatory Visit (INDEPENDENT_AMBULATORY_CARE_PROVIDER_SITE_OTHER): Payer: Medicare HMO | Admitting: Physician Assistant

## 2016-04-22 ENCOUNTER — Encounter: Payer: Self-pay | Admitting: Pulmonary Disease

## 2016-04-22 VITALS — BP 126/76 | HR 78 | Temp 97.9°F | Resp 16 | Ht 67.0 in | Wt 210.0 lb

## 2016-04-22 DIAGNOSIS — H40013 Open angle with borderline findings, low risk, bilateral: Secondary | ICD-10-CM | POA: Diagnosis not present

## 2016-04-22 DIAGNOSIS — R21 Rash and other nonspecific skin eruption: Secondary | ICD-10-CM

## 2016-04-22 NOTE — Patient Instructions (Addendum)
  Continue medications until this course is complete. It can take up to 2-3 weeks for the crusted lesions to completley resolve. You can wash the area gently.  If you develop any visual issues please seek care immediately. Avoid excessive sunlight while on doxycycline because your skin is more sensitive to the light while you are on this medication.    IF you received an x-ray today, you will receive an invoice from [facility]. Please contact [facility] at [phone] with questions or concerns regarding your invoice.   IF you received labwork today, you will receive an invoice from [employer]. Please contact [facility] at [phone] with questions or concerns regarding your invoice.   Our billing staff will not be able to assist you with questions regarding bills from these companies.  You will be contacted with the lab results as soon as they are available. The fastest way to get your results is to activate your My Chart account. Instructions are located on the last page of this paperwork. If you have not heard from [place] regarding the results in 2 weeks, please contact this office.

## 2016-04-22 NOTE — Progress Notes (Signed)
    MRN: [id number] DOB: [birth year]  Subjective:   Tony Cruz is a 82 y.o. male presenting for follow up on rash. Initially seen on 04/18/16. Given prescription for valtrex and doxycycline. Has taken medications appropriately. He has contacted his PCP over the past five days to inform him he was being treated for both shingles and cellulitis. His PCPprescribed him prednisone to take along with the medications I prescribed at the previous visit.    Pt notes that the pain, swelling, and redness have gotten better. He is still sensitive on touch to the touch over his left scalp and he had a new outbreak over his left eyelid after he left our office on 04/18/16. However, the new lesion has dried up and it is not bothering the pt.  Denies visual disturbance,  fever, and chills. Pt has an appointment with his ophthalmologist today.  Taequan has a current medication list which includes the following prescription(s): acetaminophen, amlodipine, aspirin, calcium carbonate, vitamin d, doxycycline, finasteride, lecithin, loratadine, losartan, multivitamin, naproxen, dialyvite omega-3 concentrate, pantoprazole, tamsulosin, tramadol, and valacyclovir. Also is allergic to shellfish allergy.  Cleotis  has a past medical history of Allergy; Anal fissure; Backache, unspecified; Diverticulosis of colon (without mention of hemorrhage); Esophageal reflux; hypertension; Hypertrophy of prostate with urinary obstruction and other lower urinary tract symptoms (LUTS); Irritable bowel syndrome; Obstructive sleep apnea; Osteoarthrosis, unspecified whether generalized or localized, unspecified site; Other abnormal glucose; Overweight(278.02); Pacemaker-MDT; Syncope and collapse; and Unspecified hereditary and idiopathic peripheral neuropathy. Also  has a past surgical history that includes Appendectomy; Cholecystectomy; Tonsillectomy; pacemaker placement; and skin cancer removed (12/2012).  Objective:   Vitals: BP 126/76 (BP  Location: Left Arm, Patient Position: Sitting, Cuff Size: Normal)   Pulse 78   Temp 97.9 F (36.6 C) (Oral)   Resp 16   Ht 5\' 7"  (1.702 m)   Wt 210 lb (95.3 kg)   SpO2 97%   BMI 32.89 kg/m   Physical Exam  Constitutional: He is oriented to person, place, and time. He appears well-developed and well-nourished.  HENT:  Head: Normocephalic and atraumatic.    One 1cm healing ulcer with central brown eschar on left scalp. No surrounding erythema. Satellite pustules inferior to lesion have come to a head and have no surrounding erythema or warmth noted.  There is one 1.5 cm crusted lesion noted on lateral aspect of left superior eyelid. Rash follows the opthalmic dermatome.     Eyes: Conjunctivae are normal.  Neck: Normal range of motion.  Pulmonary/Chest: Effort normal.  Neurological: He is alert and oriented to person, place, and time.  Skin: Skin is warm and dry.     Psychiatric: He has a normal mood and affect.  Vitals reviewed.      No results found for this or any previous visit (from the past 24 hour(s)).  Assessment and Plan :  1. Rash and nonspecific skin eruption -Continue medications until full course has been completed. -Return to clinic if symptoms worsen, do not improve, or as needed  Tenna Delaine, PA-C [facility] [phone] 04/22/2016 11:28 AM

## 2016-05-14 ENCOUNTER — Ambulatory Visit (HOSPITAL_COMMUNITY): Payer: Medicare HMO | Attending: Cardiology

## 2016-05-14 ENCOUNTER — Ambulatory Visit (INDEPENDENT_AMBULATORY_CARE_PROVIDER_SITE_OTHER): Payer: Medicare HMO

## 2016-05-14 ENCOUNTER — Other Ambulatory Visit: Payer: Self-pay

## 2016-05-14 DIAGNOSIS — I501 Left ventricular failure: Secondary | ICD-10-CM | POA: Diagnosis not present

## 2016-05-14 DIAGNOSIS — I351 Nonrheumatic aortic (valve) insufficiency: Secondary | ICD-10-CM | POA: Diagnosis not present

## 2016-05-14 DIAGNOSIS — R0782 Intercostal pain: Secondary | ICD-10-CM | POA: Diagnosis not present

## 2016-05-14 DIAGNOSIS — I712 Thoracic aortic aneurysm, without rupture: Secondary | ICD-10-CM | POA: Diagnosis not present

## 2016-05-14 DIAGNOSIS — I517 Cardiomegaly: Secondary | ICD-10-CM | POA: Diagnosis not present

## 2016-05-14 LAB — EXERCISE TOLERANCE TEST
CHL CUP STRESS STAGE 1 DBP: 95 mmHg
CHL CUP STRESS STAGE 1 GRADE: 0 %
CHL CUP STRESS STAGE 1 HR: 88 {beats}/min
CHL CUP STRESS STAGE 1 SBP: 164 mmHg
CHL CUP STRESS STAGE 3 GRADE: 0 %
CHL CUP STRESS STAGE 3 SPEED: 1 mph
CHL CUP STRESS STAGE 4 GRADE: 0 %
CHL CUP STRESS STAGE 4 SPEED: 1 mph
CHL CUP STRESS STAGE 5 DBP: 82 mmHg
CHL CUP STRESS STAGE 5 SBP: 156 mmHg
CHL CUP STRESS STAGE 6 HR: 137 {beats}/min
CHL CUP STRESS STAGE 7 HR: 121 {beats}/min
CHL CUP STRESS STAGE 7 SBP: 156 mmHg
CHL CUP STRESS STAGE 7 SPEED: 0 mph
CHL CUP STRESS STAGE 8 GRADE: 0 %
CHL CUP STRESS STAGE 8 HR: 89 {beats}/min
CSEPPMHR: 97 %
Estimated workload: 6.4 METS
Exercise duration (min): 4 min
Exercise duration (sec): 30 s
MPHR: 140 {beats}/min
Peak HR: 137 {beats}/min
Percent HR: 99 %
RPE: 15
Rest HR: 83 {beats}/min
Stage 1 Speed: 0 mph
Stage 2 Grade: 0 %
Stage 2 HR: 87 {beats}/min
Stage 2 Speed: 0 mph
Stage 3 HR: 83 {beats}/min
Stage 4 HR: 82 {beats}/min
Stage 5 Grade: 10 %
Stage 5 HR: 122 {beats}/min
Stage 5 Speed: 1.7 mph
Stage 6 Grade: 12 %
Stage 6 Speed: 2.5 mph
Stage 7 DBP: 85 mmHg
Stage 7 Grade: 0 %
Stage 8 Speed: 0 mph

## 2016-05-15 ENCOUNTER — Telehealth: Payer: Self-pay | Admitting: *Deleted

## 2016-05-15 NOTE — Telephone Encounter (Signed)
-----   Message from [facility], [place] sent at 05/14/2016  5:13 PM EDT ----- Please let the patient know his stress test looked OK, nothing to suggest heart disease.   Thanks [employer]

## 2016-05-15 NOTE — Telephone Encounter (Signed)
LMOVM TO CALL BACK  

## 2016-05-16 ENCOUNTER — Encounter: Payer: Self-pay | Admitting: Physician Assistant

## 2016-05-16 ENCOUNTER — Other Ambulatory Visit: Payer: Self-pay | Admitting: *Deleted

## 2016-05-16 DIAGNOSIS — I77819 Aortic ectasia, unspecified site: Secondary | ICD-10-CM

## 2016-05-16 NOTE — Telephone Encounter (Signed)
Follow Up:    Returning Tony Cruz 's call from yesterday,thinks it was about his results.

## 2016-05-16 NOTE — Telephone Encounter (Signed)
This encounter was created in error - please disregard.

## 2016-05-17 ENCOUNTER — Other Ambulatory Visit: Payer: Self-pay | Admitting: *Deleted

## 2016-05-17 DIAGNOSIS — Z8679 Personal history of other diseases of the circulatory system: Secondary | ICD-10-CM

## 2016-05-20 ENCOUNTER — Other Ambulatory Visit: Payer: Medicare HMO | Admitting: *Deleted

## 2016-05-20 DIAGNOSIS — Z8679 Personal history of other diseases of the circulatory system: Secondary | ICD-10-CM

## 2016-05-20 LAB — BASIC METABOLIC PANEL
BUN: 12 mg/dL (ref 7–25)
CHLORIDE: 102 mmol/L (ref 98–110)
CO2: 28 mmol/L (ref 20–31)
Calcium: 9.3 mg/dL (ref 8.6–10.3)
Creat: 0.93 mg/dL (ref 0.70–1.11)
Glucose, Bld: 93 mg/dL (ref 65–99)
POTASSIUM: 4.4 mmol/L (ref 3.5–5.3)
SODIUM: 138 mmol/L (ref 135–146)

## 2016-05-21 ENCOUNTER — Ambulatory Visit (INDEPENDENT_AMBULATORY_CARE_PROVIDER_SITE_OTHER)
Admission: RE | Admit: 2016-05-21 | Discharge: 2016-05-21 | Disposition: A | Discharge status: Home / Self Care | Payer: Medicare HMO | Source: Ambulatory Visit | Attending: Physician Assistant | Admitting: Physician Assistant

## 2016-05-21 ENCOUNTER — Telehealth: Payer: Self-pay | Admitting: *Deleted

## 2016-05-21 ENCOUNTER — Encounter: Payer: Self-pay | Admitting: *Deleted

## 2016-05-21 DIAGNOSIS — I712 Thoracic aortic aneurysm, without rupture: Secondary | ICD-10-CM | POA: Diagnosis not present

## 2016-05-21 DIAGNOSIS — I77819 Aortic ectasia, unspecified site: Secondary | ICD-10-CM | POA: Diagnosis not present

## 2016-05-21 MED ORDER — IOPAMIDOL (ISOVUE-370) INJECTION 76%
100.0000 mL | Freq: Once | INTRAVENOUS | Status: AC | PRN
  Administered 2016-05-21: 100 mL via INTRAVENOUS

## 2016-05-21 NOTE — Telephone Encounter (Signed)
SPOKE TO PT ABOUT RESULTS AND VERBALIZED UNDERSTANDING AND ALSO PT STATES THAT NOT FOR SURE SHELL FISH TYPE BUT NOT ANAPHYLAXIS TYPE REACTIONS JUST AN RASH

## 2016-05-21 NOTE — Telephone Encounter (Signed)
-----   Message from [facility], [place] sent at 05/21/2016  4:52 AM EDT ----- Please let the patient know lab looks ok for CT scan, he has a listed shellfish allergy without a reaction.  Please find out about this before he has the CT scan done.  If he has true allergy will need prep.  Thanks [employer]

## 2016-05-22 ENCOUNTER — Telehealth: Payer: Self-pay | Admitting: *Deleted

## 2016-05-22 NOTE — Telephone Encounter (Signed)
SPOKE TO PT WIFE DPR AND  ABOUT RESULTS AND VERBALIZED UNDERSTANDING

## 2016-05-22 NOTE — Telephone Encounter (Signed)
-----   Message from [facility], [place] sent at 05/22/2016  8:32 AM EDT ----- Can you let the patient know that his cat scan looked OK, his aorta mildly dilated, only recommended annual CT scans to follow this.  Thanks, [employer]

## 2016-05-24 DIAGNOSIS — N400 Enlarged prostate without lower urinary tract symptoms: Secondary | ICD-10-CM | POA: Diagnosis not present

## 2016-05-27 DIAGNOSIS — M25561 Pain in right knee: Secondary | ICD-10-CM | POA: Diagnosis not present

## 2016-05-31 DIAGNOSIS — R351 Nocturia: Secondary | ICD-10-CM | POA: Diagnosis not present

## 2016-05-31 DIAGNOSIS — N401 Enlarged prostate with lower urinary tract symptoms: Secondary | ICD-10-CM | POA: Diagnosis not present

## 2016-06-04 DIAGNOSIS — Z85828 Personal history of other malignant neoplasm of skin: Secondary | ICD-10-CM | POA: Diagnosis not present

## 2016-06-04 DIAGNOSIS — L720 Epidermal cyst: Secondary | ICD-10-CM | POA: Diagnosis not present

## 2016-06-04 DIAGNOSIS — D1801 Hemangioma of skin and subcutaneous tissue: Secondary | ICD-10-CM | POA: Diagnosis not present

## 2016-06-13 DIAGNOSIS — M25561 Pain in right knee: Secondary | ICD-10-CM | POA: Diagnosis not present

## 2016-06-18 DIAGNOSIS — R262 Difficulty in walking, not elsewhere classified: Secondary | ICD-10-CM | POA: Diagnosis not present

## 2016-06-18 DIAGNOSIS — M25561 Pain in right knee: Secondary | ICD-10-CM | POA: Diagnosis not present

## 2016-06-18 DIAGNOSIS — M7662 Achilles tendinitis, left leg: Secondary | ICD-10-CM | POA: Diagnosis not present

## 2016-06-24 DIAGNOSIS — R262 Difficulty in walking, not elsewhere classified: Secondary | ICD-10-CM | POA: Diagnosis not present

## 2016-06-24 DIAGNOSIS — M7662 Achilles tendinitis, left leg: Secondary | ICD-10-CM | POA: Diagnosis not present

## 2016-06-24 DIAGNOSIS — M25561 Pain in right knee: Secondary | ICD-10-CM | POA: Diagnosis not present

## 2016-06-27 DIAGNOSIS — M25561 Pain in right knee: Secondary | ICD-10-CM | POA: Diagnosis not present

## 2016-06-27 DIAGNOSIS — R262 Difficulty in walking, not elsewhere classified: Secondary | ICD-10-CM | POA: Diagnosis not present

## 2016-06-27 DIAGNOSIS — M7662 Achilles tendinitis, left leg: Secondary | ICD-10-CM | POA: Diagnosis not present

## 2016-07-12 ENCOUNTER — Encounter: Payer: Self-pay | Admitting: Internal Medicine

## 2016-07-12 ENCOUNTER — Ambulatory Visit (INDEPENDENT_AMBULATORY_CARE_PROVIDER_SITE_OTHER): Payer: Medicare HMO | Admitting: Internal Medicine

## 2016-07-12 VITALS — BP 130/88 | HR 80 | Ht 69.0 in | Wt 214.2 lb

## 2016-07-12 DIAGNOSIS — I495 Sick sinus syndrome: Secondary | ICD-10-CM

## 2016-07-12 DIAGNOSIS — Z950 Presence of cardiac pacemaker: Secondary | ICD-10-CM

## 2016-07-12 LAB — CUP PACEART INCLINIC DEVICE CHECK
Battery Impedance: 4429 Ohm
Battery Remaining Longevity: 9 mo
Brady Statistic AP VP Percent: 0 %
Brady Statistic AS VP Percent: 1 %
Brady Statistic AS VS Percent: 98 %
Date Time Interrogation Session: 20171215171010
Implantable Lead Implant Date: 19990330
Implantable Lead Location: 753859
Implantable Lead Location: 753860
Implantable Lead Model: 5092
Implantable Pulse Generator Implant Date: 20081121
Lead Channel Impedance Value: 726 Ohm
Lead Channel Impedance Value: 744 Ohm
Lead Channel Pacing Threshold Amplitude: 0.75 V
Lead Channel Pacing Threshold Pulse Width: 0.4 ms
Lead Channel Pacing Threshold Pulse Width: 0.4 ms
Lead Channel Pacing Threshold Pulse Width: 0.4 ms
Lead Channel Sensing Intrinsic Amplitude: 2.8 mV
Lead Channel Setting Pacing Amplitude: 2 V
Lead Channel Setting Pacing Amplitude: 2.5 V
Lead Channel Setting Sensing Sensitivity: 2 mV
MDC IDC LEAD IMPLANT DT: 19990330
MDC IDC LEAD MODEL: 5068
MDC IDC MSMT BATTERY VOLTAGE: 2.64 V
MDC IDC MSMT LEADCHNL RA PACING THRESHOLD AMPLITUDE: 0.5 V
MDC IDC MSMT LEADCHNL RA PACING THRESHOLD AMPLITUDE: 0.625 V
MDC IDC MSMT LEADCHNL RA SENSING INTR AMPL: 2.8 mV
MDC IDC MSMT LEADCHNL RV PACING THRESHOLD AMPLITUDE: 0.75 V
MDC IDC MSMT LEADCHNL RV PACING THRESHOLD PULSEWIDTH: 0.4 ms
MDC IDC SET LEADCHNL RV PACING PULSEWIDTH: 0.4 ms
MDC IDC STAT BRADY AP VS PERCENT: 0 %

## 2016-07-12 MED ORDER — LOSARTAN POTASSIUM 100 MG PO TABS: ORAL_TABLET | ORAL | 3 refills | Status: DC

## 2016-07-12 NOTE — Patient Instructions (Signed)
Medication Instructions: - Your physician recommends that you continue on your current medications as directed. Please refer to the Current Medication list given to you today.  Labwork: - none ordered  Procedures/Testing: - none ordered  Follow-Up: - Remote monitoring is used to monitor your Pacemaker of ICD from home. This monitoring reduces the number of office visits required to check your device to one time per year. It allows [place] to keep an eye on the functioning of your device to ensure it is working properly. You are scheduled for a device check from home on 08/12/16. You may send your transmission at any time that day. If you have a wireless device, the transmission will be sent automatically. After your physician reviews your transmission, you will receive a postcard with your next transmission date.    Any Additional Special Instructions Will Be Listed Below (If Applicable).     If you need a refill on your cardiac medications before your next appointment, please call your pharmacy.

## 2016-07-12 NOTE — Progress Notes (Signed)
Patient Care Team: [name], MD as PCP - General   HPI  Tony Cruz is a 82 y.o. male is seen in followup for syncope secondary to intermittent sinus node dysfunction for which a pacemaker was previously implanted and changed out 11/08. He has had intercurrent syncope thought to be vasovagal   And he has had recurrent episodes, these were postprandial. They were associated with a recognizable prodrome. His rate drop response on his pacemaker activated.  No recurrent syncope      Past Medical History:  Diagnosis Date  . Allergy   . Anal fissure   . Backache, unspecified   . Diverticulosis of colon (without mention of hemorrhage)   . Esophageal reflux   . hypertension   . Hypertrophy of prostate with urinary obstruction and other lower urinary tract symptoms (LUTS)   . Irritable bowel syndrome   . Obstructive sleep apnea   . Osteoarthrosis, unspecified whether generalized or localized, unspecified site   . Other abnormal glucose   . Overweight(278.02)   . Pacemaker-MDT    Change out 2008  . Syncope and collapse   . Unspecified hereditary and idiopathic peripheral neuropathy     Past Surgical History:  Procedure Laterality Date  . APPENDECTOMY    . CHOLECYSTECTOMY    . PACEMAKER PLACEMENT    . skin cancer removed  12/2012   removed from his face--Dr. Sarajane Jews  . TONSILLECTOMY      Current Outpatient Prescriptions  Medication Sig Dispense Refill  . acetaminophen (TYLENOL) 500 MG tablet Take 1,000 mg by mouth every 6 (six) hours as needed for pain.    Marland Kitchen amLODipine (NORVASC) 5 MG tablet Take 1 tablet by mouth  daily 90 tablet 2  . aspirin 81 MG tablet Take 81 mg by mouth daily.    . calcium carbonate (OS-CAL) 600 MG TABS Take 600 mg by mouth daily.      . Cholecalciferol (VITAMIN D) 1000 UNITS capsule Take 1,000 Units by mouth daily.      . finasteride (PROSCAR) 5 MG tablet Take 1 tablet by mouth daily.    . Lecithin 1200 MG CAPS Take 1 capsule by mouth  daily.      Marland Kitchen loratadine (CLARITIN) 10 MG tablet Take 10 mg by mouth daily.      Marland Kitchen losartan (COZAAR) 100 MG tablet Take 1 tablet by mouth  daily 90 tablet 2  . Multiple Vitamin (MULTIVITAMIN) capsule Take 1 capsule by mouth daily.      . naproxen (NAPROSYN) 500 MG tablet Take 1 tablet (500 mg total) by mouth 2 (two) times daily. 60 tablet 2  . Omega-3 Fatty Acids (DIALYVITE OMEGA-3 CONCENTRATE) 600 MG CAPS Take 1 capsule by mouth daily.    . pantoprazole (PROTONIX) 40 MG tablet TAKE ONE TABLET DAILY 90 tablet 1  . Tamsulosin HCl (FLOMAX) 0.4 MG CAPS Take 0.4 mg by mouth daily.      . traMADol (ULTRAM) 50 MG tablet Take 1 tablet (50 mg total) by mouth 3 (three) times daily. 270 tablet 1   No current facility-administered medications for this visit.     Allergies  Allergen Reactions  . Shellfish Allergy Rash    Sensitive to shellfish    Review of Systems negative except from HPI and PMH  Physical Exam BP 130/88   Pulse 80   Ht 5\' 9"  (1.753 m)   Wt 214 lb 3.2 oz (97.2 kg)   SpO2 97%   BMI  31.63 kg/m  Well developed and well nourished in no acute distress HENT normal E scleral and icterus clear Neck Supple JVP flat; carotids brisk and full Clear to ausculation  Regular rate and rhythm, no murmurs gallops or rub Soft with active bowel sounds No clubbing cyanosis none Edema Alert and oriented, grossly normal motor and sensory function Skin Warm and Dry    ECG demonstrates sinus rhythm 80 15/13/40 RBBB  Otherwise normal  Assessment and plan  Syncope presumed neurally mediated  Sinus node dysfunction-prob neurally   Pacemaker The patient's device was interrogated.  The information was reviewed. No changes were made in the programming.     Hypertension   Balance Neuropathy  Have suggested he discuss with PCP re balance PT Overall doing better  At change out we should consider Biotronik with CLS

## 2016-07-15 ENCOUNTER — Encounter: Payer: Self-pay | Admitting: Pulmonary Disease

## 2016-07-15 ENCOUNTER — Ambulatory Visit (INDEPENDENT_AMBULATORY_CARE_PROVIDER_SITE_OTHER): Payer: Medicare HMO | Admitting: Pulmonary Disease

## 2016-07-15 ENCOUNTER — Ambulatory Visit: Payer: Medicare HMO | Admitting: Pulmonary Disease

## 2016-07-15 VITALS — BP 124/78 | HR 70 | Temp 97.2°F | Ht 69.0 in | Wt 214.5 lb

## 2016-07-15 DIAGNOSIS — M159 Polyosteoarthritis, unspecified: Secondary | ICD-10-CM

## 2016-07-15 DIAGNOSIS — R7301 Impaired fasting glucose: Secondary | ICD-10-CM

## 2016-07-15 DIAGNOSIS — Z950 Presence of cardiac pacemaker: Secondary | ICD-10-CM | POA: Diagnosis not present

## 2016-07-15 DIAGNOSIS — K5730 Diverticulosis of large intestine without perforation or abscess without bleeding: Secondary | ICD-10-CM

## 2016-07-15 DIAGNOSIS — N138 Other obstructive and reflux uropathy: Secondary | ICD-10-CM

## 2016-07-15 DIAGNOSIS — G609 Hereditary and idiopathic neuropathy, unspecified: Secondary | ICD-10-CM

## 2016-07-15 DIAGNOSIS — I495 Sick sinus syndrome: Secondary | ICD-10-CM | POA: Diagnosis not present

## 2016-07-15 DIAGNOSIS — G4733 Obstructive sleep apnea (adult) (pediatric): Secondary | ICD-10-CM | POA: Diagnosis not present

## 2016-07-15 DIAGNOSIS — N401 Enlarged prostate with lower urinary tract symptoms: Secondary | ICD-10-CM

## 2016-07-15 DIAGNOSIS — K219 Gastro-esophageal reflux disease without esophagitis: Secondary | ICD-10-CM

## 2016-07-15 DIAGNOSIS — M150 Primary generalized (osteo)arthritis: Secondary | ICD-10-CM

## 2016-07-15 DIAGNOSIS — Z8679 Personal history of other diseases of the circulatory system: Secondary | ICD-10-CM

## 2016-07-15 DIAGNOSIS — K580 Irritable bowel syndrome with diarrhea: Secondary | ICD-10-CM

## 2016-07-15 DIAGNOSIS — I10 Essential (primary) hypertension: Secondary | ICD-10-CM

## 2016-07-15 MED ORDER — HYDROCORTISONE 2.5 % RE CREA: 1.0000 "application " | TOPICAL_CREAM | Freq: Two times a day (BID) | RECTAL | 6 refills | Status: DC

## 2016-07-15 NOTE — Patient Instructions (Signed)
Today we updated your med list in our EPIC system...    Continue your current medications the same...  We gave you the STOOL CARDS to check for hidden blood & mail or bring back to our lab for development...  In the meanwhile-- use the ANUSOL-HC cream after each BM & at bedtime...  We will arrange for a recheck by DrPyrtle in GI...  We discussed increasing your exercise program, perhaps Silver sneakers or similar group...    Plus the stretching exercises- consider Yoga etc...  Call for any questions...  Let's plan a follow up visit in 39mo, sooner if needed for problems.Marland KitchenMarland Kitchen

## 2016-07-15 NOTE — Progress Notes (Signed)
Subjective:    Patient ID: Tony Cruz, male    DOB: 10-11-1933, 82 y.o.   MRN: [id number]  HPI 82 y/o WM here for a follow up visit... he has multiple medical problems as noted below...  ~  SEE PREV EPIC NOTES FOR OLDER DATA >>    LABS 7/14:  FLP- ok x TG=190 on diet alone;  Chems- ok x BS=114 A1c=6.6;  CBC- wnl;  TSH=1.27;  PSA=0.89;  Uric=5.9.Tony KitchenMarland Cruz  CXR 6/15 showed normal heart size, mild tortuous Ao, pacer, clear lungs, NAD...  LABS 6/15:  FLP- wnl on diet;  Chems- wnl x BS=121, A1c=6.4;  CBC- wnl;  TSH=1.73;  VitD=49...   ~  January 03, 2015:  89mo [place] reports stable- "just aging" he says;  He brought a list of complaints/ questions today:  They are disrupted at home w/ Cruz remodel; notes his urine is darker than usual (UA is clear- asked to incr water intake); taking Naprosyn & worried about renal (Cr=0.89 & reassured); +FamHx DM & he wants another A1c (BS=114, A1c=6.0);  Neuropathy eval by DrAquino- reviewed w/ pt- not a lot of pain, mild burning, friend takes Gabapentin; disturbance of taste & worried about B12, Zinc, Copper (B12=594); leg cramps in thigh area- trying mustard, Tumeric; athritis pains- stiff, swelling, esp in hands... We reviewed the following medical problems during today's office visit >>     AR> on Claritin & Flonase added for "swelling" sensation... Note 10% eos on CBC...    OSA> followed by DrClance, stable on CPAP, continue same...    HBP> on ASA81, Cozaar100 & Amlod5; BP= 142/78 & he denies CP, palpit, SOB, edema; DrKlein prev stopped his Hct due to dizziness......    HxSyncope, sinus node dysfunction, Pacer> followed by DrKlein, pacer function normally, no issues ident...    BorderlineDM, overweight> on diet alone; weight is down 3# to 198# today; Labs 6/16 showed BS=114, A1c=6.0; we reviewed diet, exercise, wt reduction...     GI- GERD, Barrett's, Divertics, IBS> on Pantoprazole40; known [place], HxBarrett's, prev Rx by DrWeissman & now DrPyrtle at [facility]  w/ EGD 11/14 showing gastritis, esophagitis, gastric polyps (path=+Barretts, reflux related injury, neg HPylori); colonoscopy 11/14 showing mod divertics, & 76mm polyp in cecum (tub adenoma)...    GU- BPH> on Flomax0.4 & Proscar5; followed by DrRDavis=> DrEskridge... Asked to take meds regularly.    DJD, LBP> on Naprosyn, Tramadol, Tylenol; c/o hands and ankles stiff but he still plays golf; eval by Kirby Funk & he released him "I've done all I can do for you"    Fall 2/15 w/ nondisplaced skull fx and subdural hematoma> treated conservatively, he had some resid HA, dizzy, etc & treated w/ Epley maneuvers- improved...    Periph neuropathy> thorough eval 2011 by DrReynolds & Hickling- idiopathic; 2nd opinion DrAquino- min discomfort & not on specific therapy for this...    Anxious> aware, not on meds... We reviewed prob list, meds, xrays and labs> see below for updates >>   CXR 6/16 showed norm heart size, pacer, clear lungs, NAD...  LABS 6/16:  FLP- at goals on diet;  Chems- ok x BS=114, A1c=6.0;  CBC- wnl x 10%eos;  TSH=1.23;  B12=594;  UA- clear, neg protein.... PLAN>>  All questions answered, stable on current med regimen; Flonase added, call for problems...  ~  July 04, 2015:  16mo Tony Cruz reports a good interval- feeling well & no new complaints or concerns... He requested refer to [facility] for hearing eval &  allergy referral to [place] for testing due to jellow jacket stings...    Hx OSA on CPAP prev followed by [facility]; gets mask & tubing changed Q76mo via [employer], rests well 6-8H/night, using CPAP every night, wakes rested & no daytime sleep pressure or issues w/ alertness...    BP controlled on Amlod5 & Losar100, measures 118/72 today & he denies HA, CP, palpit, SOB, edema, etc; pacer checked by DrKlein & stable...     Weight = 205# which is up 7# from last OV 7 we reviewed diet, exercise, wt reducing strategies...     GI & GU stable on Protonix40 & Proscar5+Flomax0.4.Tony KitchenMarland Cruz    He has DJD, LBP,  and fell 2/15 w/ skull fx & subdural, neuropathy> he saw DrNudelman w/ rec for Aleve and exercise! Kirby Funk is retiring- on [employer] 7 notes that he hurts everyday.    Derm- DrDJones removed an inflamed cyst from his back... EXAM shows Afeb, VSS, O2sat=97% on RA;  HEENT- neg, mallampati2;  Chest- clear w/o w/r/r;  Heart- RR w/o m/r/g;  Abd-  Soft, nontender, neg;  Ext- neg w/o c/c/e;  Neuro- neuropathy, no other focal abn... IMP/PLAN>>  [name] is stable, continue same meds, we plan ROV in 80mo...   ~  October 19, 2015:  [name] & add-on appt trequested for dizziness> pt c/o balance off, says it started 10/08/15- just awoke dizzy & thought it was prob recurrent vertigo like in 2015; he took OTC [place] & a nurse at his church demonstrated the Epley maneuvers; he improved enough to play a round of golf- walked 18 holes- but noticed he was worse again the next day. Noted that when he rolled over in bed he was very dizzy; he called ENT- DrShoemaker and was told to call us; pt denies n/v, denies f/c/s, he has been walking w/ wife's walker for balance/support; he has mult other somatic complaints...     EXAM shows Afeb, VSS, no nystagmus, O2sat=98% on RA;  HEENT- neg, ears clear, mallampati1;  Chest- clear w/o w/r/r;  Heart- RR w/o m/r/g;  Neuro- appears intact w/o focal abn... IMP/PLAN>>  Tony Cruz is concerned that the fall, skull fx, subdural that he had Feb2015 could be causing this- we can't get MRI due to cardac pacer, therefore we will check CT Head;  In the interim he will Rx w/ Antivert 25mg  tid prn and do the epley maneuvers Q4-6H; we discussed Neurology eval if symptoms persist...  ADDENDUM>>  CT Head 10/24/15 showed no acute changes- w/o infarct, hemmorhage, mass, etc; there were some chr changes w/ atrophy 7 small vessel dis, no abn enhancement, prev subdural resolved...  ~  January 23, 2016:  15mo ROV & [name] has recovered fully from prev dizziness episode and no further repercussions from the fall/ skull  fx/ subdural that he had in 2015;  He reports incr prob w/ arthritis- this time in his right knee, pain occurred after bowling, no obvious trauma, etc;  He saw [place] w/ arthritis, poss a sl tear he said & treated conserv w/ shot & his Naprosyn (both helped);  He reports active- golfs 3d/wk, walking some, REC to add silver sneakers!  We reviewed the following medical problems during today's office visit >>     AR> on Claritin & Flonase- note 10% eos on CBC in past; we referred him for hearing eval- he wanted [facility] lab...    OSA> prev followed by DrClance- last seen 2013, he uses [employer], new machine in 2013, stable on CPAP,  no recent download available & we will try to obtain...    HBP> on ASA81, Cozaar100 & Amlod5; BP= 114/68 & he denies CP, palpit, SOB, edema; DrKlein prev stopped his Hct due to dizziness......    HxSyncope, sinus node dysfunction, Pacer> followed by DrKlein- last seen 06/2015, he does telechecks, pacer function normally, no issues ident...    Hyperlipidemia>  Chol has been good but TG elev in past; on Fish Oil supplements;  FLP 6/17 showed TChol 154, TG 213, HDL 45, LDL 83 & rec better low fat diet, get wt down.    BorderlineDM, overweight> on diet alone; weight is down 4# to 209# today & he needs to do better; Labs 6/17 showed BS=102, A1c=6.1; we reviewed diet, exercise, wt reduction...     GI- GERD, Barrett's, Divertics, IBS, colon polyp> on Pantoprazole40; known [place], HxBarrett's, prev Rx by DrWeissman & now DrPyrtle at [facility] w/ EGD 11/14 showing gastritis, esophagitis, gastric polyps (path=+Barretts, reflux related injury, neg HPylori) & f/u EGD planned 48yrs;  Colonoscopy 11/14 showing mod divertics, & 12mm polyp in cecum (tub adenoma)=> f/u 18yrs if eligible.    GU- BPH> on Flomax0.4 & Proscar5; followed by DrRDavis=> DrEskridge, last seen 03/2015... Asked to take meds regularly.    DJD, LBP> on Naprosyn, Tramadol, Tylenol; c/o hands and ankles stiff but he still plays golf;  eval by Kirby Funk & he released him "I've done all I can do for you"    Fall 2/15 w/ nondisplaced skull fx and subdural hematoma> treated conservatively, he had some resid HA, dizzy, etc & treated w/ Epley maneuvers- improved...    Periph neuropathy> thorough eval 2011 by DrReynolds & Hickling- idiopathic; 2nd opinion DrAquino- min discomfort & not on specific therapy for this...    Anxious> aware, not on meds... EXAM shows Afeb, VSS, no nystagmus, O2sat=98% on RA;  HEENT- neg, ears clear, mallampati1;  Chest- clear w/o w/r/r;  Heart- RR w/o m/r/g;  Neuro- appears intact w/o focal abn...  LABS 01/23/16:  FLP- Chol ok but TG=213 & needs better diet;  Chems- wnl w/ BS=102, A1c=6.1;  CBC- wnl x 13% eos;  TSH=1.65 IMP/PLAN>>  Zhion is +-stable on his current regimen- continue same, needs better diet, get wt down, etc;  We will try to get CPAP download;  ROV in 91mo...  ~  July 15, 2016:  74mo ROV and pulm/medical check>  [place] reports a good interval overall, but c/o seeing blood in his stools recently; he is followed for GI by DrPyrtle> On Protonix40 & last seen 05/2013 for EGD & Colon (see below); he recalls hx of rectal fissure in the past & we discussed collecting stool cards to check for occult blood & Rx w/ anusol-HC cream=> he will f/u w/ GI- currently overdue for f/u EGD... we reviewed the following medical problems during today's office visit >>     AR> on Claritin & Flonase OTC- note 10% eos on CBC in past; we referred him for hearing eval- he wanted [facility] lab...    OSA> prev followed by DrClance- last seen 2013, he uses [facility], new machine in 2013 & it is still working well he says, stable on CPAP- he says he brought in the chip to be read...    HBP> on ASA81, Cozaar100 & Amlod5; BP= 124/78 & he denies CP, palpit, SOB, edema; DrKlein prev stopped his Hct due to dizziness......    HxSyncope, sinus node dysfunction, Pacer> followed by DrKlein- last seen 06/2016 (note reviewed), hx syncope  believed  due to intermit sinus node dysfunction=> pacer placed & doing satis; he's also had vasovagal episodes...     Hyperlipidemia>  Chol has been good but TG elev in past; on Fish Oil supplements;  FLP 6/17 showed TChol 154, TG 213, HDL 45, LDL 83 & rec better low fat diet, get wt down.    BorderlineDM, overweight> on diet alone; weight is up 5# to 215# today & he needs to do better; Labs 6/17 showed BS=102, A1c=6.1; we reviewed diet, exercise, wt reduction...     GI- GERD, Barrett's, Divertics, IBS, colon polyp> on Pantoprazole40; known [place], HxBarrett's, followed by DrPyrtle w/ EGD 11/14 showing gastritis, esophagitis, gastric polyps (path=+Barretts, reflux related injury, neg HPylori) & f/u EGD planned 12yrs;  Colonoscopy 11/14 showing mod divertics, & 1mm polyp in cecum (tub adenoma)=> f/u 86yrs if eligible.    GU- BPH> on Flomax0.4 & Proscar5; followed by DrRDavis=> DrEskridge, last seen 03/2015... Asked to take meds regularly... He saw their PA recently & reports that PSA was 1.0    DJD, LBP> on Naprosyn, Tramadol, Tylenol; c/o hands and ankles stiff & achilles issues but he still plays golf; eval by [employer] & he released him "I've done all I can do for you"; he saw DrCaffrey 10/17 w/ R knee pain- injected.    Fall 2/15 w/ nondisplaced skull fx and subdural hematoma> treated conservatively, he had some resid HA, dizzy, etc & treated w/ Epley maneuvers- improved...    Periph neuropathy> thorough eval 2011 by DrReynolds & Hickling- idiopathic; 2nd opinion DrAquino- min discomfort & not on specific therapy for this...    Anxious> aware, not on meds...    ?Shingles>  He had an outbreak in the left V1 nerve distribution 03/2016, seen at [facility] & treated w/ valtrex + Doxy; he called [place] & we added Pred; he reports rash resolved, no residuals... EXAM shows Afeb, VSS, O2sat=98% on RA;  HEENT- neg, ears clear, mallampati1;  Chest- clear w/o w/r/r;  Heart- pacer, RR w/o m/r/g;  Abd- soft, nontender, neg;  Ext-  VI tr edema no c/c;  Neuro- appears intact w/o focal abn... IMP/PLAN>>  [place] will check stool cards, use the Anusol-HC cream and follow up w/ DrPyrtle as discussed; he needs to incr exercise & in light of his arthritis "I've got arthritis in every joint in my body" rec to use bike vs swimming etc...           Problem List:   OBSTRUCTIVE SLEEP APNEA (ICD-327.23) - evaluation, diagnosis, and Rx by the [facility] (their notes scanned into the EMR):  he had PSG 4/08 w/ AHI= 26, desat to 83%... placed on CPAP at 11cm H20 pressure... he also has some RLS & was on Requip transiently from the [place]. ~  4/11: pt indicates that he uses his CPAP 4/7 nights for 6-7 hr/night; he feels that he rests well, denies daytime hypersom, snoring, etc... he requests Sleep consult here in [place] (see DrClance consult). ~  5/13: he saw DrClance for Sleep f/u> noted 10# wt gain, pt reported using CPAP compliantly w/ good daytime alertness, no issues w/ mask but requesting new machine ([employer]) as his is >95yrs old; requested to work on wt reduction... ~  6/15: his eval & initial therapy was done by the [facility]; followed by DrClance w/ good compliance & no issues w/ sleep or daytime hypersomnolence.  ALLERGIC RHINITIS (ICD-477.9) - controlled on Claritin, and OTC meds... ~  12/15: we discussed trial of Antihist, Flonase, nasal saline to help  his smell & taste...   HYPERTENSION (ICD-401.9) >>  ~  on ASA 81mg /d & HCTZ 25mg /d...  ~  CXR 4/11 is clear and WNL, NAD... ~  6/12:  BP=122/82 today and he denies HA, fatigue, visual changes, CP, palipit, dizziness, syncope, dyspnea, edema, etc... ~  6/13:  BP= 142/98 & he admits intermittently elev at home; we discussed change med to LOSARTAN/ HCT 100/12.5 daily; he will monitor BP & f/u here... ~  11/13:  BP= 132/88 & similar at home; denies CP, palpit, SOB, edema... ~  6/14:  BP= 126/84 & he remains asymptomatic... ~  6/15:  on ASA81, Cozaar100 & Amlod5; BP= 132/88 & he denies CP, palpit, SOB,  edema; DrKlein stopped his Hct due to dizziness. ~  12/15: on ASA81, Cozaar100 & Amlod5; BP= 120/84 & he remains essentially asymptomatic... ~  6/16:  on ASA81, Cozaar100 & Amlod5; BP= 142/78 & he denies CP, palpit, SOB, edema; DrKlein prev stopped his Hct due to dizziness. ~  6/17: on ASA81, Cozaar100 & Amlod5; BP= 114/68 & he denies CP, palpit, SOB, edema; DrKlein prev stopped his Hct due to dizziness  Hx of SYNCOPE (ICD-780.2) & CARDIAC PACEMAKER IN SITU (ICD-V45.01) - he had pacer changed 11/08 DrKlein (hosp DC Summary reviewed)... he continues regular pacer checks and f/u w/ DrKlein...  ~  cath 3/99 DrJoeLeB showed norm coronaries, norm LV, norm Ao... ~  He saw DrKlein 12/11 for f/u syncope secondary to sinus node dysfunction, treated w/ pacer & generator replaced 2008;  He noted intercurrent syncope that was felt to be vasovagal in origin (this occurred in the Cards clinic 8/11 while waiting for his wife, & he has a long hx of this from blood draws over the yrs) & DrKlein reprogramed his device; EKG w/ SBrady & otherw WNL... ~  He saw DrKlein for Cards/EP f/u 12/12> hx syncope related to [facility] dysfunction- devise working properly & no changes made to the programming; TSH & sed rate wetre checked and WNL... ~  Yearly f/u DrKlein 12/13> doing satis w/o intercurrent syncope, pacer working satis & f/u planned 69yr... ~  12/14: he had yearly ROV w/ DrKlein, doing satis & pacer OK, no changes made... ~  He continues regular f/u w/ DrKlein & notes reviewed...  DIABETES MELLITUS, BORDERLINE (ICD-790.29) - he's been concerned about his BS due to [facility] and his neuropathy symptoms, but his BS has only been borderline elvated prev...  ~  labs 4/10 (wt=209#) showed BS= 113, A1c= 6.3.Tony Cruz. rec> better diet, get wt down. ~  labs 4/11 (wt=222#) showed BS= 109, A1c= 6.2.Tony Cruz. no meds yet, just needs to get wt down. ~  Labs 6/12 (wt=204#) showed BS= 102, A1v= 6.4 ~  Labs 6/13 (wt=213#) showed BS= 105, A1c= 6.4 ~   Labs 6/14 showed BS= 114, A1c= 6.6 ~  Labs 6/15 showed BS= 121, A1c= 6.4 ~  Labs remain under good control on diet alone...  OVERWEIGHT (ICD-278.02) - we discussed diet + exercise therapy (again)... ~  weight 4/09 = 226#.Tony KitchenMarland Cruz 5" 9" Tall for a BMI= 33-34 ~  weight 4/10 = 209#... BMI down to 31... keep up the good work. ~  weight 4/11 = 222# .Tony KitchenMarland Cruz BMI up to 33. ~  Weight 6/12 = 204# ~  Weight 6/13 = 213# ~  Weight 6/14 = 214# ~  Weight 6/15 = 218# ~  Weight 12/15 = 202# keep up the good work... ~  Encouraged re Diet/ Exercise/ etc...  GASTROESOPHAGEAL REFLUX DISEASE (ICD-530.81) w/ BARRETT'S  ESOPH - followed by DrWeissman & Rx w/ OMEPRAZOLE 20mg Bid... ~  prev EGD's documented a [place] & Barrett's esoph... (last 1/03 by DrPatterson). ~  last EGD was 2/08 showing Barrett's epithelium in lower 1/3 of esoph... f/u planned 38yrs. ~  4/11:  NOTE> f/u EGD is due, he will decide on gastroenterologist ==> he saw DrMJohnson w/ EGD 5/11 showing Irreg Z-line & bx c/w Barrett's esoph (no dysplasia), the rest of the esoph, stomach, & duodenum were WNL... ~  EGD 11/14 by DrPyrtle showed gastritis, esophagitis, gastric polyps (path=+Barretts, reflux related injury, neg HPylori)... F/u planned 3 yrs.  DIVERTICULOSIS OF COLON (ICD-562.10)  IRRITABLE BOWEL SYNDROME (ICD-564.1) ~  last colonoscopy 2/08 by DrWeissman showed left sided divertics, otherw neg... f/u planned 25yrs... ~  Colonoscopy 11/14 by DrPyrtle showed mod divertics, & 48mm polyp in cecum (tub adenoma)... F/u planned 5 yrs if eligible.  BENIGN PROSTATIC HYPERTROPHY, WITH OBSTRUCTION (ICD-600.01) - on FLOMAX 0.4mg /d & PROSCAR 5mg /d... eval and Rx by [facility] who checks pt q25mo (we don't have notes from him)... pt tells me he will be seeing DrDavis regularly when he moves to [facility]. ~  9/13: pt reports that he is now seeing DrEskridge at [facility] & his PSAs have all been normal; his major prob has been holding his urine & eval revealed not emptying  completely- they have tried several meds to help this; he will have notes sent to [place] for [facility] records.  OSTEOARTHRITIS (ICD-715.90) >> BACK PAIN (ICD-724.5) >>  ~  managed by DrAnderson & difficult problem, thought to have an inflamm arthropathy (?variation of psoriatic arthritis) however he did not respond to NSAIDs/Celebrex, Pred, MTX, anti-TNF inhibitor; on Tramadol prn;  ~  pt saw DrBeane 6/10 for eval> neuropathy symptoms & lumbar spondylosis on XRays, Rx Mobic Prn... offered Myelogram for further eval... he also takes Calcium & Vit D. ~  He was evaluated by Kirby Funk for Rheum => on Naprosyn, Tylenol; c/o hands and ankles stiff but he still plays golf etc; states that they released him "I've done all I can do for you, if you had RA I could help you"  FALL w/ SUBDURAL HEMATOMA >> treated conservatively, he had some resid HA, dizzy, etc & treated w/ Epley maneuvers- improved CT Head 4/15 showed mild atrophy & sm vessel dis & scat lacunes, prev left parietal extra-axial fluid collection is resolved...   PERIPHERAL NEUROPATHY (ICD-356.9) - DrReynolds evaluated him w/ NCV's etc and dx a peripheral neuropathy... this is one of his CC> numbness, decr sensation in feet, (no pain) w/ some assoc balance problems intermittently...  he tried Mentanx but no benefit...  ~  4/11: I reviewed DrReynolds 11/10 note w/ the patient... ~  He also had a thorough neurologic evaluation 10/11 from one of his church physicians, DrHickling, for f/u polyneuropathy> balance issues, LBP, gait abn, variable paresthesias, foot numbness (sl decr vibration & pin prick), some subjective memory problems w/ norm MMSE, abnormal NCVs;  Prev Labs were all WNL, unrevealing;  etiology of his neuropathy is unknown= cryptogenic, and since it isn't painful he really didn't require any therapy... ~  6/13: he continues to experience prob w/ balance etc related to his neuropathy; not progressive & still not painful; also notes nocturnal leg  cramps & he's tried bar of soap trick, mustard, etc- all w/o relief (we rec trial Tonic water)... ~  2015> Neuro follow up by DrAquino for [facility]...  Health Maintenance - he takes a number of Vits + lecithin, saw  palmetto, Fish Oil, etc... he saw DrDJones in 2010 for Derm review- pt reports nothing major found...   Past Surgical History:  Procedure Laterality Date  . APPENDECTOMY    . CHOLECYSTECTOMY    . PACEMAKER PLACEMENT    . skin cancer removed  12/2012   removed from his face--Dr. Sarajane Jews  . TONSILLECTOMY      Outpatient Encounter Prescriptions as of 07/15/2016  Medication Sig Dispense Refill  . acetaminophen (TYLENOL) 500 MG tablet Take 1,000 mg by mouth every 6 (six) hours as needed for pain.    Tony Cruz amLODipine (NORVASC) 5 MG tablet Take 1 tablet by mouth  daily 90 tablet 2  . aspirin 81 MG tablet Take 81 mg by mouth daily.    . calcium carbonate (OS-CAL) 600 MG TABS Take 600 mg by mouth daily.      . Cholecalciferol (VITAMIN D) 1000 UNITS capsule Take 1,000 Units by mouth daily.      . finasteride (PROSCAR) 5 MG tablet Take 1 tablet by mouth daily.    . Lecithin 1200 MG CAPS Take 1 capsule by mouth daily.      Tony Cruz loratadine (CLARITIN) 10 MG tablet Take 10 mg by mouth daily.      Tony Cruz losartan (COZAAR) 100 MG tablet Take 1 tablet by mouth  daily 90 tablet 3  . Multiple Vitamin (MULTIVITAMIN) capsule Take 1 capsule by mouth daily.      . naproxen (NAPROSYN) 500 MG tablet Take 1 tablet (500 mg total) by mouth 2 (two) times daily. 60 tablet 2  . Omega-3 Fatty Acids (DIALYVITE OMEGA-3 CONCENTRATE) 600 MG CAPS Take 1 capsule by mouth daily.    . pantoprazole (PROTONIX) 40 MG tablet TAKE ONE TABLET DAILY 90 tablet 1  . Tamsulosin HCl (FLOMAX) 0.4 MG CAPS Take 0.4 mg by mouth daily.      . traMADol (ULTRAM) 50 MG tablet Take 1 tablet (50 mg total) by mouth 3 (three) times daily. 270 tablet 1  . hydrocortisone (ANUSOL-HC) 2.5 % rectal cream Place 1 application rectally 2 (two) times  daily. 30 g 6   No facility-administered encounter medications on file as of 07/15/2016.     Allergies  Allergen Reactions  . Shellfish Allergy Rash    Sensitive to shellfish    Current Medications, Allergies, Past Medical History, Past Surgical History, Family History, and Social History were reviewed in [employer] record.   Review of Systems        The patient complains of sleep disorder, dyspnea on exertion, gas/bloating, joint pain, stiffness, arthritis, paresthesias, and difficulty walking.  The patient denies fever, chills, sweats, anorexia, fatigue, weakness, malaise, weight loss, blurring, diplopia, eye irritation, eye discharge, vision loss, eye pain, photophobia, earache, ear discharge, tinnitus, decreased hearing, nasal congestion, nosebleeds, sore throat, hoarseness, chest pain, palpitations, syncope, orthopnea, PND, peripheral edema, cough, dyspnea at rest, excessive sputum, hemoptysis, wheezing, pleurisy, nausea, vomiting, diarrhea, constipation, change in bowel habits, abdominal pain, melena, hematochezia, jaundice, indigestion/heartburn, dysphagia, odynophagia, dysuria, hematuria, urinary frequency, urinary hesitancy, nocturia, incontinence, back pain, joint swelling, muscle cramps, muscle weakness, sciatica, restless legs, leg pain at night, leg pain with exertion, rash, itching, dryness, suspicious lesions, paralysis, seizures, tremors, vertigo, transient blindness, frequent falls, frequent headaches, depression, anxiety, memory loss, confusion, cold intolerance, heat intolerance, polydipsia, polyphagia, polyuria, unusual weight change, abnormal bruising, bleeding, enlarged lymph nodes, urticaria, allergic rash, hay fever, and recurrent infections.     Objective:   Physical Exam     WD, WN,  82 y/o WM in NAD... GENERAL:  Alert & oriented; pleasant & cooperative... HEENT:  [place]/AT, EOM-wnl, PERRLA, EACs-clear, TMs-wnl, NOSE-clear, THROAT-clear &  wnl. NECK:  Supple w/ fairROM; no JVD; normal carotid impulses w/o bruits; no thyromegaly or nodules palpated; no lymphadenopathy. CHEST:  Clear to P & A; without wheezes/ rales/ or rhonchi. HEART:  Regular Rhythm; without murmurs/ rubs/ or gallops; pacer in left shoulder area... ABDOMEN:  Soft & nontender; normal bowel sounds; no organomegaly or masses detected. EXT: without deformities, mild arthritic changes; no varicose veins/ +venous insuffic/ no edema. NEURO:  CN's intact;  fair gait;  Motor normal, sensory variable deficits... strength seems OK, min decr sensation in LEs. DERM:  No lesions noted; no rash etc...  RADIOLOGY DATA:  Reviewed in the EPIC EMR & discussed w/ the patient...  LABORATORY DATA:  Reviewed in the EPIC EMR & discussed w/ the patient...   Assessment & Plan:    OSA>  Prev followed by DrClance & stable on CPAP; he says he brought chip into [facility]...  HBP>  His BP is improved & stable on Cozaar & Amlodipine, continue same...  Hx Syncope/ Pacer>  Followed by DrKlein & pacer functioning normally, no further syncope or vasovagal episodes...  DM, borderline>  On diet alone & managing well, A1c remains 6.1 & we reviewed the import of wt reduction...  GERD, Hx Barrett's>  S/p EGD from DrPyrtle 2014 w/ small segment Barrett's mucosa, no dysplasia; now c/o hematochezia (we will check stools, Rx AnusolHC, & refer back to GI).  Divertics, IBS>  Colonoscopy 11/14 w/ 25mm tub adenoma removed...  BPH>  Followed by DrEskridge at [facility] on [place]; biggest issue is control & he says it was from not emptying completely, they have tried several meds and continue to follow up regularly, he reports PSAs have been ok; he will get notes to [place]...  DJD>  Followed by Kirby Funk & still having difficulty w/ pain & stiffness...  Periph Neuropathy>  Eval by Neuro, cryptogenic, no meds needed & symptoms remain the same...   Patient's Medications  New Prescriptions    HYDROCORTISONE (ANUSOL-HC) 2.5 % RECTAL CREAM    Place 1 application rectally 2 (two) times daily.  Previous Medications   ACETAMINOPHEN (TYLENOL) 500 MG TABLET    Take 1,000 mg by mouth every 6 (six) hours as needed for pain.   AMLODIPINE (NORVASC) 5 MG TABLET    Take 1 tablet by mouth  daily   ASPIRIN 81 MG TABLET    Take 81 mg by mouth daily.   CALCIUM CARBONATE (OS-CAL) 600 MG TABS    Take 600 mg by mouth daily.     CHOLECALCIFEROL (VITAMIN D) 1000 UNITS CAPSULE    Take 1,000 Units by mouth daily.     FINASTERIDE (PROSCAR) 5 MG TABLET    Take 1 tablet by mouth daily.   LECITHIN 1200 MG CAPS    Take 1 capsule by mouth daily.     LORATADINE (CLARITIN) 10 MG TABLET    Take 10 mg by mouth daily.     LOSARTAN (COZAAR) 100 MG TABLET    Take 1 tablet by mouth  daily   MULTIPLE VITAMIN (MULTIVITAMIN) CAPSULE    Take 1 capsule by mouth daily.     NAPROXEN (NAPROSYN) 500 MG TABLET    Take 1 tablet (500 mg total) by mouth 2 (two) times daily.   OMEGA-3 FATTY ACIDS (DIALYVITE OMEGA-3 CONCENTRATE) 600 MG CAPS    Take 1 capsule  by mouth daily.   PANTOPRAZOLE (PROTONIX) 40 MG TABLET    TAKE ONE TABLET DAILY   TAMSULOSIN HCL (FLOMAX) 0.4 MG CAPS    Take 0.4 mg by mouth daily.     TRAMADOL (ULTRAM) 50 MG TABLET    Take 1 tablet (50 mg total) by mouth 3 (three) times daily.  Modified Medications   No medications on file  Discontinued Medications   No medications on file

## 2016-07-16 DIAGNOSIS — G4733 Obstructive sleep apnea (adult) (pediatric): Secondary | ICD-10-CM | POA: Diagnosis not present

## 2016-07-19 ENCOUNTER — Other Ambulatory Visit (INDEPENDENT_AMBULATORY_CARE_PROVIDER_SITE_OTHER): Payer: Medicare HMO

## 2016-07-19 ENCOUNTER — Other Ambulatory Visit: Payer: Self-pay

## 2016-07-19 DIAGNOSIS — K5730 Diverticulosis of large intestine without perforation or abscess without bleeding: Secondary | ICD-10-CM

## 2016-07-19 LAB — HEMOCCULT SLIDES (X 3 CARDS)
Fecal Occult Blood: NEGATIVE
OCCULT 1: POSITIVE — AB
OCCULT 2: POSITIVE — AB
OCCULT 3: NEGATIVE
OCCULT 4: NEGATIVE
OCCULT 5: NEGATIVE

## 2016-07-25 ENCOUNTER — Ambulatory Visit: Payer: Medicare HMO | Admitting: Pulmonary Disease

## 2016-07-26 ENCOUNTER — Ambulatory Visit (INDEPENDENT_AMBULATORY_CARE_PROVIDER_SITE_OTHER): Payer: Medicare HMO

## 2016-07-26 ENCOUNTER — Ambulatory Visit (INDEPENDENT_AMBULATORY_CARE_PROVIDER_SITE_OTHER): Payer: Medicare HMO | Admitting: Physician Assistant

## 2016-07-26 ENCOUNTER — Emergency Department (HOSPITAL_COMMUNITY)
Admission: EM | Admit: 2016-07-26 | Discharge: 2016-07-26 | Disposition: A | Discharge status: Home / Self Care | Payer: Medicare HMO | Attending: Emergency Medicine | Admitting: Emergency Medicine

## 2016-07-26 ENCOUNTER — Encounter (HOSPITAL_COMMUNITY): Payer: Self-pay | Admitting: Neurology

## 2016-07-26 ENCOUNTER — Ambulatory Visit (HOSPITAL_BASED_OUTPATIENT_CLINIC_OR_DEPARTMENT_OTHER)
Admission: RE | Admit: 2016-07-26 | Discharge: 2016-07-26 | Disposition: A | Discharge status: Home / Self Care | Payer: Medicare HMO | Source: Ambulatory Visit

## 2016-07-26 VITALS — BP 124/80 | HR 88 | Temp 98.2°F | Resp 18 | Ht 69.0 in | Wt 215.0 lb

## 2016-07-26 DIAGNOSIS — Z7901 Long term (current) use of anticoagulants: Secondary | ICD-10-CM | POA: Diagnosis not present

## 2016-07-26 DIAGNOSIS — Z7982 Long term (current) use of aspirin: Secondary | ICD-10-CM | POA: Insufficient documentation

## 2016-07-26 DIAGNOSIS — I10 Essential (primary) hypertension: Secondary | ICD-10-CM | POA: Diagnosis not present

## 2016-07-26 DIAGNOSIS — S99912A Unspecified injury of left ankle, initial encounter: Secondary | ICD-10-CM

## 2016-07-26 DIAGNOSIS — S8992XA Unspecified injury of left lower leg, initial encounter: Secondary | ICD-10-CM | POA: Diagnosis not present

## 2016-07-26 DIAGNOSIS — Z79899 Other long term (current) drug therapy: Secondary | ICD-10-CM | POA: Insufficient documentation

## 2016-07-26 DIAGNOSIS — I82402 Acute embolism and thrombosis of unspecified deep veins of left lower extremity: Secondary | ICD-10-CM | POA: Insufficient documentation

## 2016-07-26 DIAGNOSIS — X58XXXA Exposure to other specified factors, initial encounter: Secondary | ICD-10-CM

## 2016-07-26 DIAGNOSIS — M7989 Other specified soft tissue disorders: Secondary | ICD-10-CM | POA: Diagnosis not present

## 2016-07-26 DIAGNOSIS — M79662 Pain in left lower leg: Secondary | ICD-10-CM | POA: Diagnosis not present

## 2016-07-26 DIAGNOSIS — Z950 Presence of cardiac pacemaker: Secondary | ICD-10-CM | POA: Diagnosis not present

## 2016-07-26 LAB — CBC
HEMATOCRIT: 38.6 % — AB (ref 39.0–52.0)
HEMOGLOBIN: 13.1 g/dL (ref 13.0–17.0)
MCH: 31.3 pg (ref 26.0–34.0)
MCHC: 33.9 g/dL (ref 30.0–36.0)
MCV: 92.3 fL (ref 78.0–100.0)
Platelets: 275 10*3/uL (ref 150–400)
RBC: 4.18 MIL/uL — ABNORMAL LOW (ref 4.22–5.81)
RDW: 14.3 % (ref 11.5–15.5)
WBC: 8.4 10*3/uL (ref 4.0–10.5)

## 2016-07-26 LAB — BASIC METABOLIC PANEL
ANION GAP: 10 (ref 5–15)
BUN: 10 mg/dL (ref 6–20)
CALCIUM: 9.5 mg/dL (ref 8.9–10.3)
CHLORIDE: 101 mmol/L (ref 101–111)
CO2: 26 mmol/L (ref 22–32)
Creatinine, Ser: 0.85 mg/dL (ref 0.61–1.24)
GFR calc non Af Amer: >60 mL/min (ref >60)
GLUCOSE: 103 mg/dL — AB (ref 65–99)
Potassium: 4.8 mmol/L (ref 3.5–5.1)
Sodium: 137 mmol/L (ref 135–145)

## 2016-07-26 LAB — ANTITHROMBIN III: ANTITHROMB III FUNC: 100 % (ref 75–120)

## 2016-07-26 MED ORDER — RIVAROXABAN 15 MG PO TABS
15.0000 mg | ORAL_TABLET | Freq: Once | ORAL | Status: AC
  Administered 2016-07-26: 15 mg via ORAL
  Filled 2016-07-26: qty 1

## 2016-07-26 MED ORDER — RIVAROXABAN (XARELTO) VTE STARTER PACK (15 & 20 MG): ORAL_TABLET | ORAL | 0 refills | Status: DC

## 2016-07-26 NOTE — ED Notes (Signed)
Dr Pickering at bedside 

## 2016-07-26 NOTE — ED Provider Notes (Signed)
[facility] DEPT Provider Note   CSN: [id number] Arrival date & time: 07/26/16  1411     History   Chief Complaint Chief Complaint  Patient presents with  . DVT    HPI Tony Cruz is a 82 y.o. male.  HPI Patient twisted his left ankle around 8 days ago. Around 4 days ago he began to have swelling in his calf. Some pain. Seen at urgent care and had outpatient Doppler done after x-rays that showed DVT. No chest pain or trouble breathing. He does not smoke. He states he did drive 6 hours the car the other day. He is a golfer 3 times a week. He is active. He said the fall and twist his ankle was unusual for him. No blood in the stool.  Past Medical History:  Diagnosis Date  . Allergy   . Anal fissure   . Backache, unspecified   . Diverticulosis of colon (without mention of hemorrhage)   . Esophageal reflux   . hypertension   . Hypertrophy of prostate with urinary obstruction and other lower urinary tract symptoms (LUTS)   . Irritable bowel syndrome   . Obstructive sleep apnea   . Osteoarthrosis, unspecified whether generalized or localized, unspecified site   . Other abnormal glucose   . Overweight(278.02)   . Pacemaker-MDT    Change out 2008  . Syncope and collapse   . Unspecified hereditary and idiopathic peripheral neuropathy     Patient Active Problem List   Diagnosis Date Noted  . History of skull fracture 10/19/2015  . History of subdural hematoma 10/19/2015  . Headache(784.0) 09/30/2013  . Sinoatrial node dysfunction (HCC) 06/01/2013  . Pacemaker-MDT 07/02/2011  . Night sweats 07/02/2011  . DIZZINESS 03/22/2010  . Impaired fasting glucose 11/02/2009  . Obstructive sleep apnea 11/21/2008  . Backache 11/03/2008  . Overweight 11/02/2007  . Essential hypertension 11/02/2007  . ALLERGIC RHINITIS 11/02/2007  . GASTROESOPHAGEAL REFLUX DISEASE 11/02/2007  . Diverticulosis of large intestine 11/02/2007  . Benign prostatic hyperplasia with urinary obstruction  11/02/2007  . SYNCOPE 11/02/2007  . Hereditary and idiopathic peripheral neuropathy 10/30/2007  . IRRITABLE BOWEL SYNDROME 10/30/2007  . Osteoarthritis 10/30/2007    Past Surgical History:  Procedure Laterality Date  . APPENDECTOMY    . CHOLECYSTECTOMY    . PACEMAKER PLACEMENT    . skin cancer removed  12/2012   removed from his face--Dr. Sarajane Jews  . TONSILLECTOMY         Home Medications    Prior to Admission medications   Medication Sig Start Date End Date Taking? Authorizing Provider  acetaminophen (TYLENOL) 500 MG tablet Take 500-1,000 mg by mouth every 6 (six) hours as needed for headache (or pain).    Yes Historical Provider, MD  amLODipine (NORVASC) 5 MG tablet Take 1 tablet by mouth  daily Patient taking differently: Take 5 mg by mouth daily.  12/12/15  Yes [name], MD  aspirin 81 MG tablet Take 81 mg by mouth daily.   Yes Historical Provider, MD  calcium carbonate (OS-CAL) 600 MG TABS Take 600 mg by mouth daily.     Yes Historical Provider, MD  Cholecalciferol (VITAMIN D) 1000 UNITS capsule Take 1,000 Units by mouth daily.     Yes Historical Provider, MD  doxycycline (VIBRAMYCIN) 100 MG capsule Take 100 mg by mouth 2 (two) times daily. 07/16/16  Yes Historical Provider, MD  finasteride (PROSCAR) 5 MG tablet Take 1 tablet by mouth daily. 08/13/11  Yes Historical Provider, MD  Lecithin 1200 MG CAPS Take 1 capsule by mouth daily.     Yes Historical Provider, MD  loratadine (CLARITIN) 10 MG tablet Take 10 mg by mouth daily.     Yes Historical Provider, MD  losartan (COZAAR) 100 MG tablet Take 1 tablet by mouth  daily 07/12/16  Yes Deboraha Sprang, MD  Multiple Vitamin (MULTIVITAMIN) capsule Take 1 capsule by mouth daily.     Yes Historical Provider, MD  naproxen (NAPROSYN) 500 MG tablet Take 1 tablet (500 mg total) by mouth 2 (two) times daily. 12/12/15  Yes [name], MD  Omega-3 Fatty Acids (DIALYVITE OMEGA-3 CONCENTRATE) 600 MG CAPS Take 1 capsule by mouth daily.    Yes Historical Provider, MD  pantoprazole (PROTONIX) 40 MG tablet TAKE ONE TABLET DAILY Patient taking differently: Take 40 mg by mouth in the evening 02/23/16  Yes [name], MD  PAZEO 0.7 % SOLN Place 1 drop into both eyes daily as needed (for allergies).  05/28/16  Yes Historical Provider, MD  Tamsulosin HCl (FLOMAX) 0.4 MG CAPS Take 0.4 mg by mouth daily.     Yes Historical Provider, MD  traMADol (ULTRAM) 50 MG tablet Take 1 tablet (50 mg total) by mouth 3 (three) times daily. Patient taking differently: Take 50-100 mg by mouth See admin instructions. 50 mg in the morning and 100 mg at bedtime 09/25/15  Yes [name], MD  hydrocortisone (ANUSOL-HC) 2.5 % rectal cream Place 1 application rectally 2 (two) times daily. 07/15/16   [name], MD  Rivaroxaban 15 & 20 MG TBPK Take as directed on package: Start with one 15mg  tablet by mouth twice a day with food. On Day 22, switch to one 20mg  tablet once a day with food. 07/26/16   Davonna Belling, MD    Family History Family History  Problem Relation Age of Onset  . Coronary artery disease Father     bladder cancer  . Coronary artery disease Mother   . Diabetes Sister     2 sisters    Social History Social History  Substance Use Topics  . Smoking status: Never Smoker  . Smokeless tobacco: Never Used  . Alcohol use 0.0 oz/week     Comment: occasionally      Allergies   Shellfish allergy   Review of Systems Review of Systems  Constitutional: Negative for appetite change and fever.  Respiratory: Negative for shortness of breath.   Cardiovascular: Positive for leg swelling. Negative for chest pain.  Gastrointestinal: Negative for abdominal pain.  Genitourinary: Negative for flank pain.  Musculoskeletal: Positive for joint swelling. Negative for neck pain.  Skin: Negative for wound.  Neurological: Negative for syncope.     Physical Exam Updated Vital Signs BP 133/84   Pulse 70   Temp 97.4 F (36.3 C) (Oral)    Resp 11   Ht 5\' 9"  (1.753 m)   Wt 215 lb (97.5 kg)   SpO2 99%   BMI 31.75 kg/m   Physical Exam  Constitutional: He appears well-developed.  HENT:  Head: Atraumatic.  Neck: Neck supple.  Cardiovascular: Normal rate and regular rhythm.   Pulmonary/Chest: Effort normal.  Abdominal: Soft. There is no tenderness.  Musculoskeletal: He exhibits edema.  Edema of left lower  leg with tenderness on the calf. Some swelling of ankle and slight swelling or knee. Pulse intact in foot. Good capillary refill.  Neurological: He is alert.  Skin: Skin is warm.     ED Treatments / Results  Labs (all labs ordered are listed, but only abnormal results are displayed) Labs Reviewed  CBC - Abnormal; Notable for the following:       Result Value   RBC 4.18 (*)    HCT 38.6 (*)    All other components within normal limits  BASIC METABOLIC PANEL - Abnormal; Notable for the following:    Glucose, Bld 103 (*)    All other components within normal limits  ANTITHROMBIN III  PROTEIN C ACTIVITY  PROTEIN C, TOTAL  PROTEIN S ACTIVITY  PROTEIN S, TOTAL  LUPUS ANTICOAGULANT PANEL  BETA-2-GLYCOPROTEIN I ABS, IGG/M/A  HOMOCYSTEINE  FACTOR 5 LEIDEN  PROTHROMBIN GENE MUTATION  CARDIOLIPIN ANTIBODIES, IGG, IGM, IGA    EKG  EKG Interpretation None       Radiology Dg Tibia/fibula Left  Result Date: 07/26/2016 CLINICAL DATA:  Injury 1 week ago.  Swelling EXAM: LEFT TIBIA AND FIBULA - 2 VIEW COMPARISON:  None. FINDINGS: Negative for fracture. No significant bony abnormality. Small calcifications in the posterior soft tissues at the level of the ankle appear benign. IMPRESSION: Negative. Electronically Signed   By: Franchot Gallo M.D.   On: 07/26/2016 11:25   Dg Ankle Complete Left  Result Date: 07/26/2016 CLINICAL DATA:  Ankle injury 1 week ago.  Soft tissue swelling. EXAM: LEFT ANKLE COMPLETE - 3+ VIEW COMPARISON:  01/20/2006 FINDINGS: Normal ankle joint. Negative for fracture or arthropathy. No  joint effusion Soft tissue calcification posteriorly was not present previously and appears benign and chronic. Mild calcaneal spurring IMPRESSION: No acute abnormality. Electronically Signed   By: Franchot Gallo M.D.   On: 07/26/2016 11:24    Procedures Procedures (including critical care time)  Medications Ordered in ED Medications  Rivaroxaban (XARELTO) tablet 15 mg (15 mg Oral Given 07/26/16 1928)     Initial Impression / Assessment and Plan / ED Course  I have reviewed the triage vital signs and the nursing notes.  Pertinent labs & imaging results that were available during my care of the patient were reviewed by me and considered in my medical decision making (see chart for details).  Clinical Course     Patient with DVT. Patient with DVT without signs of pulmonary embolism. Hypercoagulable panel sent. Will discharge home on Xarelto.  Final Clinical Impressions(s) / ED Diagnoses   Final diagnoses:  Acute thromboembolism of deep veins of left lower extremity (HCC)    New Prescriptions New Prescriptions   RIVAROXABAN 15 & 20 MG TBPK    Take as directed on package: Start with one 15mg  tablet by mouth twice a day with food. On Day 22, switch to one 20mg  tablet once a day with food.     Davonna Belling, MD 07/26/16 [phone]

## 2016-07-26 NOTE — Progress Notes (Signed)
Preliminary results by tech - Venous Duplex Left Lower Ext. Completed. Positive for acute deep vein thrombosis involving the left gastrocnemius veins. All other veins are patent without evidence of DVT or SVT.  Results given to [place], Med Asst.  Oda Cogan, BS, RDMS, RVT

## 2016-07-26 NOTE — Patient Instructions (Addendum)
GO TO [facility] PARKING.  GO STRAIGHT TO ADMITTING.  THEY WILL TAKE YOU TO VASCULAR ULTRASOUND.  APPOINTMENT IS 1PM TODAY.  PLEASE ARRIVE 15 MIN EARLY.    IF you received an x-ray today, you will receive an invoice from [facility]. Please contact [facility] at [phone] with questions or concerns regarding your invoice.   IF you received labwork today, you will receive an invoice from [facility]. Please contact [facility] at [phone] with questions or concerns regarding your invoice.   Our billing staff will not be able to assist you with questions regarding bills from these companies.  You will be contacted with the lab results as soon as they are available. The fastest way to get your results is to activate your My Chart account. Instructions are located on the last page of this paperwork. If you have not heard from [place] regarding the results in 2 weeks, please contact this office.

## 2016-07-26 NOTE — ED Triage Notes (Signed)
Pt has been having left calf swelling since 12/20 after a fall. Today had [place] and dx with DVT. Told to come here for treatment. Denies CP or SOB.

## 2016-07-26 NOTE — Progress Notes (Signed)
Tony Cruz  MRN: [id number] DOB: [birth year]  Subjective:  Tony Cruz is a 82 y.o. male seen in office today for a chief complaint of injury one week ago. He  twisted his left ankle over a rocking chair. Notes that his left ankle had swelling later that night. This then progressed lower leg swelling and discomfort.  He drove yesterday for 7 hours and notes the swelling got progressively worse after the drive. He has associated calf pain and notes it is deep in the leg x 4-5 days. Denies inability to ambulate, SOB, chest pain, recent surgery and palpitations.  Has tried ice with no relief. No history of clots or malignancy.     Review of Systems  Constitutional: Negative for appetite change, chills, diaphoresis and fever.  Respiratory: Negative for cough and shortness of breath.   Cardiovascular: Negative for chest pain and palpitations.    Patient Active Problem List   Diagnosis Date Noted  . History of skull fracture 10/19/2015  . History of subdural hematoma 10/19/2015  . Headache(784.0) 09/30/2013  . Sinoatrial node dysfunction (HCC) 06/01/2013  . Pacemaker-MDT 07/02/2011  . Night sweats 07/02/2011  . DIZZINESS 03/22/2010  . Impaired fasting glucose 11/02/2009  . Obstructive sleep apnea 11/21/2008  . Backache 11/03/2008  . Overweight 11/02/2007  . Essential hypertension 11/02/2007  . ALLERGIC RHINITIS 11/02/2007  . GASTROESOPHAGEAL REFLUX DISEASE 11/02/2007  . Diverticulosis of large intestine 11/02/2007  . Benign prostatic hyperplasia with urinary obstruction 11/02/2007  . SYNCOPE 11/02/2007  . Hereditary and idiopathic peripheral neuropathy 10/30/2007  . IRRITABLE BOWEL SYNDROME 10/30/2007  . Osteoarthritis 10/30/2007    Current Outpatient Prescriptions on File Prior to Visit  Medication Sig Dispense Refill  . acetaminophen (TYLENOL) 500 MG tablet Take 500-1,000 mg by mouth every 6 (six) hours as needed for headache (or pain).     Marland Kitchen amLODipine (NORVASC) 5 MG  tablet Take 1 tablet by mouth  daily (Patient taking differently: Take 5 mg by mouth daily. ) 90 tablet 2  . aspirin 81 MG tablet Take 81 mg by mouth daily.    . calcium carbonate (OS-CAL) 600 MG TABS Take 600 mg by mouth daily.      . Cholecalciferol (VITAMIN D) 1000 UNITS capsule Take 1,000 Units by mouth daily.      . finasteride (PROSCAR) 5 MG tablet Take 1 tablet by mouth daily.    . hydrocortisone (ANUSOL-HC) 2.5 % rectal cream Place 1 application rectally 2 (two) times daily. 30 g 6  . Lecithin 1200 MG CAPS Take 1 capsule by mouth daily.      Marland Kitchen loratadine (CLARITIN) 10 MG tablet Take 10 mg by mouth daily.      Marland Kitchen losartan (COZAAR) 100 MG tablet Take 1 tablet by mouth  daily 90 tablet 3  . Multiple Vitamin (MULTIVITAMIN) capsule Take 1 capsule by mouth daily.      . naproxen (NAPROSYN) 500 MG tablet Take 1 tablet (500 mg total) by mouth 2 (two) times daily. 60 tablet 2  . Omega-3 Fatty Acids (DIALYVITE OMEGA-3 CONCENTRATE) 600 MG CAPS Take 1 capsule by mouth daily.    . pantoprazole (PROTONIX) 40 MG tablet TAKE ONE TABLET DAILY (Patient taking differently: Take 40 mg by mouth in the evening) 90 tablet 1  . Tamsulosin HCl (FLOMAX) 0.4 MG CAPS Take 0.4 mg by mouth daily.      . traMADol (ULTRAM) 50 MG tablet Take 1 tablet (50 mg total) by mouth 3 (three) times daily. (Patient  taking differently: Take 50-100 mg by mouth See admin instructions. 50 mg in the morning and 100 mg at bedtime) 270 tablet 1   No current facility-administered medications on file prior to visit.     Allergies  Allergen Reactions  . Shellfish Allergy Rash    Sensitive to shellfish   Past Medical History:  Diagnosis Date  . Allergy   . Anal fissure   . Backache, unspecified   . Diverticulosis of colon (without mention of hemorrhage)   . Esophageal reflux   . hypertension   . Hypertrophy of prostate with urinary obstruction and other lower urinary tract symptoms (LUTS)   . Irritable bowel syndrome   .  Obstructive sleep apnea   . Osteoarthrosis, unspecified whether generalized or localized, unspecified site   . Other abnormal glucose   . Overweight(278.02)   . Pacemaker-MDT    Change out 2008  . Syncope and collapse   . Unspecified hereditary and idiopathic peripheral neuropathy    Past Surgical History:  Procedure Laterality Date  . APPENDECTOMY    . CHOLECYSTECTOMY    . PACEMAKER PLACEMENT    . skin cancer removed  12/2012   removed from his face--Dr. Sarajane Jews  . TONSILLECTOMY        Objective:  BP 124/80 (BP Location: Right Arm, Patient Position: Sitting, Cuff Size: Small)   Pulse 88   Temp 98.2 F (36.8 C) (Oral)   Resp 18   Ht 5\' 9"  (1.753 m)   Wt 215 lb (97.5 kg)   SpO2 98%   BMI 31.75 kg/m   Physical Exam  Constitutional: He is oriented to person, place, and time and well-developed, well-nourished, and in no distress.  HENT:  Head: Normocephalic and atraumatic.  Eyes: Conjunctivae are normal.  Neck: Normal range of motion.  Cardiovascular: Normal rate, regular rhythm and normal heart sounds.   Pulmonary/Chest: Effort normal and breath sounds normal.  Musculoskeletal:       Right ankle: Normal.       Left ankle: He exhibits swelling. He exhibits no ecchymosis. No tenderness. Achilles tendon exhibits pain.       Left upper leg: Normal.       Right lower leg: Normal.       Left lower leg: He exhibits tenderness (calf tenderness ), swelling, edema (2+) and deformity (warmth to palpation, slight erythema). He exhibits no bony tenderness.       Right foot: Normal.       Left foot: There is swelling. There is normal range of motion, no bony tenderness and normal capillary refill.  Positive Homan's sign on left leg Left calf measures 43 cm, right calf measures 37.5cm   Neurological: He is alert and oriented to person, place, and time. Gait normal.  Skin: Skin is warm and dry.  Psychiatric: Affect normal.  Vitals reviewed.   Dg Tibia/fibula Left  Result Date:  07/26/2016 CLINICAL DATA:  Injury 1 week ago.  Swelling EXAM: LEFT TIBIA AND FIBULA - 2 VIEW COMPARISON:  None. FINDINGS: Negative for fracture. No significant bony abnormality. Small calcifications in the posterior soft tissues at the level of the ankle appear benign. IMPRESSION: Negative. Electronically Signed   By: Franchot Gallo M.D.   On: 07/26/2016 11:25   Dg Ankle Complete Left  Result Date: 07/26/2016 CLINICAL DATA:  Ankle injury 1 week ago.  Soft tissue swelling. EXAM: LEFT ANKLE COMPLETE - 3+ VIEW COMPARISON:  01/20/2006 FINDINGS: Normal ankle joint. Negative for fracture or arthropathy. No joint  effusion Soft tissue calcification posteriorly was not present previously and appears benign and chronic. Mild calcaneal spurring IMPRESSION: No acute abnormality. Electronically Signed   By: Franchot Gallo M.D.   On: 07/26/2016 11:24   Wells critieria of 3  Assessment and Plan :  1. Injury of left ankle, initial encounter - DG Ankle Complete Left; Future - DG Tibia/Fibula Left; Future  2. Pain and swelling of left lower leg -Due to history, PE findings, and Wells criteria of 3, pt scheduled for STAT US of LLE at 1pm today at [facility]. I was contacted by [place] tech and informed that pt did in fact test positive for LLE DVT. I called the pt and instructed that he go directly to [facility] ED for further evaluation and treatment. Pt agreed to plan and was was being transferred to ED by his wife as we were speaking. [facility] charge nurse, Cecille Rubin, was contacted and informed of patient's case and that he would be arriving soon.  - VAS [place] LOWER EXTREMITY VENOUS (DVT); Future  Tenna Delaine PA-C  [facility] 07/26/2016 7:43 PM

## 2016-07-27 ENCOUNTER — Encounter: Payer: Self-pay | Admitting: Physician Assistant

## 2016-07-27 LAB — LUPUS ANTICOAGULANT PANEL
DRVVT: 36.3 s (ref 0.0–47.0)
PTT Lupus Anticoagulant: 32.2 s (ref 0.0–51.9)

## 2016-07-27 LAB — PROTEIN S, TOTAL: Protein S Ag, Total: 82 % (ref 60–150)

## 2016-07-27 LAB — PROTEIN S ACTIVITY: Protein S Activity: 98 % (ref 63–140)

## 2016-07-27 LAB — PROTEIN C ACTIVITY: Protein C Activity: 104 % (ref 73–180)

## 2016-07-30 DIAGNOSIS — L720 Epidermal cyst: Secondary | ICD-10-CM | POA: Diagnosis not present

## 2016-07-30 DIAGNOSIS — I8311 Varicose veins of right lower extremity with inflammation: Secondary | ICD-10-CM | POA: Diagnosis not present

## 2016-07-30 DIAGNOSIS — Z85828 Personal history of other malignant neoplasm of skin: Secondary | ICD-10-CM | POA: Diagnosis not present

## 2016-07-30 DIAGNOSIS — I872 Venous insufficiency (chronic) (peripheral): Secondary | ICD-10-CM | POA: Diagnosis not present

## 2016-07-30 DIAGNOSIS — I8312 Varicose veins of left lower extremity with inflammation: Secondary | ICD-10-CM | POA: Diagnosis not present

## 2016-07-30 LAB — BETA-2-GLYCOPROTEIN I ABS, IGG/M/A

## 2016-07-30 LAB — CARDIOLIPIN ANTIBODIES, IGG, IGM, IGA
Anticardiolipin IgA: <9 APL U/mL (ref 0–11)
Anticardiolipin IgG: <9 GPL U/mL (ref 0–14)
Anticardiolipin IgM: <9 MPL U/mL (ref 0–12)

## 2016-07-30 LAB — HOMOCYSTEINE: HOMOCYSTEINE-NORM: 9.9 umol/L (ref 0.0–15.0)

## 2016-07-30 LAB — PROTEIN C, TOTAL: PROTEIN C, TOTAL: 100 % (ref 60–150)

## 2016-07-31 ENCOUNTER — Encounter: Payer: Self-pay | Admitting: Pulmonary Disease

## 2016-07-31 LAB — FACTOR 5 LEIDEN

## 2016-07-31 LAB — PROTHROMBIN GENE MUTATION

## 2016-07-31 NOTE — Telephone Encounter (Signed)
SN  Please Advise- Pt. email below  Pt. Is aware that you would not be back in the office until Monday.  ----- Message -----  From: Iver Nestle  Sent: 07/31/2016 11:39 AM EST  To: [name], MD Subject: Non-Urgent Medical Question  Dr. Lenna Gilford, I had an unfortunate tripping fall on 12/21 which went unseen by a doctor until [place]. I had a seeming ankle/leg injury from the fall and brought a swelling leg/foot to the Urgent Care facility where PA [place] saw me and referred me that afternoon to [facility] to get a sonogram. The sonogram revealed a DVT in my left leg. I then was referred to the [facility] ER and ultimately saw Dr Davonna Belling who prescribed a month's Xarelto blood thinning and told me I was free to return to golf if I felt up to it. The discharge papers instructed me to see my Primary Care Physician for further advice and a medical refill if needed. I have taken four days of Xarelto and may have enjoyed 10-15 % reduction in swelling in my leg but still am depending on a cane for better stability. I'd love to see you or if you suggest it would be more productive, see Dr. Lorenz Coaster, [facility] doctor. Do you have time to see me on these issuesin the next week?

## 2016-08-06 ENCOUNTER — Encounter: Payer: Self-pay | Admitting: Pulmonary Disease

## 2016-08-06 NOTE — Telephone Encounter (Signed)
[name], please see patient e-mail and advise on rec's. Thanks.

## 2016-08-07 NOTE — Telephone Encounter (Signed)
SN- Please review the below email chain and advise recs, thanks! Note that pt has pending with you 08/22/16  Tony Cruz, There is no redness but the swelling and deep pain to touch is still present but somewhat less than at the time of the visit to the ER on 12/29. I am in my 12th day of taking Xeralto to thin the blood and normalize the flow in my foot, ankle and leg where the swelling is. The ER Dr. Alvino Chapel, gave me some unspecific activity suggestions and assumed this bloodthinner treatmentwould clear the problem up in 1-3 months.I feel that this is reducing the swelling and allowing me to improve my walking gait. I was content to be patient to wait for Dr. Lenna Gilford on 1/25 but was not willing to wait without doing anything I might could do to aid the recovery: specific exercises; moist heat; cold packs; compression hose, etc. Is that what your PA would tell me if I came in to see her? Tony Cruz ----- Message ----- From: CMA Doneta Public Sent: 1/10/20188:44 AM EST To: Tony Cruz Subject: RE: Non-Urgent Medical Question Hi Mr. Quiceno!  I am concerned about the swelling you say you still have.Is there any pain or redness?If we need to we can move up your appointment to see one of Dr Jeannine Kitten nurse practitioners.   ----- Message -----  From: Tony Cruz  Sent: 1/9/20187:52 PM EST  To: [name], MD Subject: RE: Non-Urgent Medical Question  to Tony Cruz (Dr. Jeannine Kitten Nurse) Tony Cruz, As you suggested, I have scheduled an appt. for Mon. 1/25 @10 :30 AM. I feel you could help me prepare for that visit by answering a few questions that bother me so that I can arrive to see Dr. Lenna Gilford in a better condition. Here is the issue: my leg and foot are still quite swollen : could I use regular moist heat treatments or ice packs to reduce the swelling? could I use compression hose to any advantage. Is elevation of the leg beneficial? I am without guidance as to how to help myself  between now and the Drs visit 16 days from now. Could you suggest how I could daily aid the healing hoped for? I don't even know if I should try to use my leg a lot or a little of if certain exercises are useful to regain normal size and use of the leg. I don't want to endanger myself with overdoing but feel that activity toward normality would be desirable. Please help me if you can with a bit of advice or a reference to source material that would help me. THANKS!

## 2016-08-12 ENCOUNTER — Ambulatory Visit (INDEPENDENT_AMBULATORY_CARE_PROVIDER_SITE_OTHER): Payer: Medicare HMO | Admitting: *Deleted

## 2016-08-12 DIAGNOSIS — I495 Sick sinus syndrome: Secondary | ICD-10-CM

## 2016-08-16 LAB — CUP PACEART REMOTE DEVICE CHECK
Battery Impedance: 4752 Ohm
Battery Remaining Longevity: 8 mo
Battery Voltage: 2.64 V
Brady Statistic AP VP Percent: 0 %
Brady Statistic AP VS Percent: 0 %
Brady Statistic AS VP Percent: 1 %
Brady Statistic AS VS Percent: 99 %
Date Time Interrogation Session: 20180115160256
Implantable Lead Implant Date: 19990330
Implantable Lead Implant Date: 19990330
Implantable Lead Location: 753859
Implantable Lead Location: 753860
Implantable Lead Model: 5068
Implantable Lead Model: 5092
Implantable Pulse Generator Implant Date: 20081121
Lead Channel Impedance Value: 703 Ohm
Lead Channel Impedance Value: 735 Ohm
Lead Channel Pacing Threshold Amplitude: 0.625 V
Lead Channel Pacing Threshold Amplitude: 0.75 V
Lead Channel Pacing Threshold Pulse Width: 0.4 ms
Lead Channel Pacing Threshold Pulse Width: 0.4 ms
Lead Channel Sensing Intrinsic Amplitude: 0.7 mV
Lead Channel Sensing Intrinsic Amplitude: 5.6 mV
Lead Channel Setting Pacing Amplitude: 2 V
Lead Channel Setting Pacing Amplitude: 2.5 V
Lead Channel Setting Pacing Pulse Width: 0.4 ms
Lead Channel Setting Sensing Sensitivity: 2 mV

## 2016-08-16 NOTE — Progress Notes (Signed)
Remote pacemaker transmission.   

## 2016-08-19 ENCOUNTER — Encounter: Payer: Self-pay | Admitting: *Deleted

## 2016-08-19 DIAGNOSIS — R69 Illness, unspecified: Secondary | ICD-10-CM | POA: Diagnosis not present

## 2016-08-20 ENCOUNTER — Other Ambulatory Visit: Payer: Self-pay | Admitting: Pulmonary Disease

## 2016-08-21 ENCOUNTER — Encounter: Payer: Self-pay | Admitting: Cardiology

## 2016-08-22 ENCOUNTER — Ambulatory Visit (INDEPENDENT_AMBULATORY_CARE_PROVIDER_SITE_OTHER): Payer: Medicare HMO | Admitting: Pulmonary Disease

## 2016-08-22 VITALS — BP 124/84 | HR 74 | Temp 97.4°F | Ht 69.0 in | Wt 219.1 lb

## 2016-08-22 DIAGNOSIS — Z8679 Personal history of other diseases of the circulatory system: Secondary | ICD-10-CM

## 2016-08-22 DIAGNOSIS — M150 Primary generalized (osteo)arthritis: Secondary | ICD-10-CM

## 2016-08-22 DIAGNOSIS — I495 Sick sinus syndrome: Secondary | ICD-10-CM | POA: Diagnosis not present

## 2016-08-22 DIAGNOSIS — G4733 Obstructive sleep apnea (adult) (pediatric): Secondary | ICD-10-CM | POA: Diagnosis not present

## 2016-08-22 DIAGNOSIS — I10 Essential (primary) hypertension: Secondary | ICD-10-CM | POA: Diagnosis not present

## 2016-08-22 DIAGNOSIS — I82402 Acute embolism and thrombosis of unspecified deep veins of left lower extremity: Secondary | ICD-10-CM | POA: Diagnosis not present

## 2016-08-22 DIAGNOSIS — G609 Hereditary and idiopathic neuropathy, unspecified: Secondary | ICD-10-CM | POA: Diagnosis not present

## 2016-08-22 DIAGNOSIS — M159 Polyosteoarthritis, unspecified: Secondary | ICD-10-CM

## 2016-08-22 DIAGNOSIS — I82409 Acute embolism and thrombosis of unspecified deep veins of unspecified lower extremity: Secondary | ICD-10-CM | POA: Insufficient documentation

## 2016-08-22 DIAGNOSIS — Z950 Presence of cardiac pacemaker: Secondary | ICD-10-CM

## 2016-08-22 MED ORDER — TRAMADOL HCL 50 MG PO TABS: 50.0000 mg | ORAL_TABLET | Freq: Three times a day (TID) | ORAL | 1 refills | Status: DC

## 2016-08-22 MED ORDER — RIVAROXABAN 20 MG PO TABS: 20.0000 mg | ORAL_TABLET | Freq: Every day | ORAL | 1 refills | Status: DC

## 2016-08-22 MED ORDER — NAPROXEN 500 MG PO TABS: ORAL_TABLET | ORAL | 3 refills | Status: DC

## 2016-08-22 MED ORDER — AMLODIPINE BESYLATE 5 MG PO TABS: 5.0000 mg | ORAL_TABLET | Freq: Every day | ORAL | 3 refills | Status: DC

## 2016-08-22 NOTE — Patient Instructions (Signed)
Today we updated your med list in our EPIC system...    Continue your current medications the same...  We refilled your XARELTO 20mg  one tab daily...  OK to start exercising again...    Use the support hose etc...  Be very carefiul, esp while on the blood thinner- NO FALLING ALLOWED!!!    Try the "snow claws" when there is snow 7 ice...  Call for any questions...  Let's plan a follow up visit in 4-97mo, sooner if needed for problems.Marland KitchenMarland Kitchen

## 2016-08-22 NOTE — Progress Notes (Signed)
Subjective:    Patient ID: Tony Cruz, male    DOB: 08/13/1933, 83 y.o.   MRN: [id number]  HPI 83 y/o WM here for a follow up visit... he has multiple medical problems as noted below...  ~  SEE PREV EPIC NOTES FOR OLDER DATA >>    LABS 7/14:  FLP- ok x TG=190 on diet alone;  Chems- ok x BS=114 A1c=6.6;  CBC- wnl;  TSH=1.27;  PSA=0.89;  Uric=5.9.Marland KitchenMarland Kitchen  CXR 6/15 showed normal heart size, mild tortuous Ao, pacer, clear lungs, NAD...  LABS 6/15:  FLP- wnl on diet;  Chems- wnl x BS=121, A1c=6.4;  CBC- wnl;  TSH=1.73;  VitD=49...   ~  January 03, 2015:  54mo Tony Cruz reports stable- "just aging" he says;  He brought a list of complaints/ questions today:  They are disrupted at home w/ kitchen remodel; notes his urine is darker than usual (UA is clear- asked to incr water intake); taking Naprosyn & worried about renal (Cr=0.89 & reassured); +FamHx DM & he wants another A1c (BS=114, A1c=6.0);  Neuropathy eval by Tony Cruz- reviewed w/ pt- not a lot of pain, mild burning, friend takes Gabapentin; disturbance of taste & worried about B12, Zinc, Copper (B12=594); leg cramps in thigh area- trying mustard, Tumeric; athritis pains- stiff, swelling, esp in hands... We reviewed the following medical problems during today's office visit >>     AR> on Claritin & Flonase added for "swelling" sensation... Note 10% eos on CBC...    OSA> followed by Tony Cruz, stable on CPAP, continue same...    HBP> on ASA81, Cozaar100 & Amlod5; BP= 142/78 & he denies CP, palpit, SOB, edema; Tony Cruz prev stopped his Hct due to dizziness......    HxSyncope, sinus node dysfunction, Pacer> followed by Tony Cruz, pacer function normally, no issues ident...    BorderlineDM, overweight> on diet alone; weight is down 3# to 198# today; Labs 6/16 showed BS=114, A1c=6.0; we reviewed diet, exercise, wt reduction...     GI- GERD, Barrett's, Divertics, IBS> on Pantoprazole40; known [place], HxBarrett's, prev Rx by Tony Cruz & now Tony Cruz at [facility]  w/ EGD 11/14 showing gastritis, esophagitis, gastric polyps (path=+Barretts, reflux related injury, neg HPylori); colonoscopy 11/14 showing mod divertics, & 78mm polyp in cecum (tub adenoma)...    GU- BPH> on Flomax0.4 & Proscar5; followed by Tony Cruz=> Tony Cruz... Asked to take meds regularly.    DJD, LBP> on Naprosyn, Tramadol, Tylenol; c/o hands and ankles stiff but he still plays golf; eval by Tony Cruz & he released him "I've done all I can do for you"    Fall 2/15 w/ nondisplaced skull fx and subdural hematoma> treated conservatively, he had some resid HA, dizzy, etc & treated w/ Epley maneuvers- improved...    Periph neuropathy> thorough eval 2011 by Tony Cruz & [facility] Cruz- idiopathic; 2nd opinion Tony Cruz- min discomfort & not on specific therapy for this...    Anxious> aware, not on meds... We reviewed prob list, meds, xrays and labs> see below for updates >>   CXR 6/16 showed norm heart size, pacer, clear lungs, NAD...  LABS 6/16:  FLP- at goals on diet;  Chems- ok x BS=114, A1c=6.0;  CBC- wnl x 10%eos;  TSH=1.23;  B12=594;  UA- clear, neg protein.... PLAN>>  All questions answered, stable on current med regimen; Flonase added, call for problems...  ~  July 04, 2015:  15mo Tony Cruz reports a good interval- feeling well & no new complaints or concerns... He requested refer to [facility] for hearing eval &  allergy referral to [facility] Cruz for testing due to jellow jacket stings...    Hx OSA on CPAP prev followed by Tony Cruz; gets mask & tubing changed Q76mo via Tony Cruz, rests well 6-8H/night, using CPAP every night, wakes rested & no daytime sleep pressure or issues w/ alertness...    BP controlled on Amlod5 & Losar100, measures 118/72 today & he denies HA, CP, palpit, SOB, edema, etc; pacer checked by Tony Cruz & stable...     Weight = 205# which is up 7# from last OV 7 we reviewed diet, exercise, wt reducing strategies...     GI & GU stable on Protonix40 & Proscar5+Flomax0.4.Marland KitchenMarland Kitchen    He has DJD, LBP,  and fell 2/15 w/ skull fx & subdural, neuropathy> he saw Tony Cruz w/ rec for Aleve and exercise! Tony Cruz is retiring- on [employer] 7 notes that he hurts everyday.    Derm- [facility] Cruz removed an inflamed cyst from his back... EXAM shows Afeb, VSS, O2sat=97% on RA;  HEENT- neg, mallampati2;  Chest- clear w/o w/r/r;  Heart- RR w/o m/r/g;  Abd-  Soft, nontender, neg;  Ext- neg w/o c/c/e;  Neuro- neuropathy, no other focal abn... IMP/PLAN>>  Tony Cruz is stable, continue same meds, we plan ROV in 80mo...   ~  October 19, 2015:  [name] & add-on appt trequested for dizziness> pt c/o balance off, says it started 10/08/15- just awoke dizzy & thought it was prob recurrent vertigo like in 2015; he took OTC [place] & a nurse at his church demonstrated the Epley maneuvers; he improved enough to play a round of golf- walked 18 holes- but noticed he was worse again the next day. Noted that when he rolled over in bed he was very dizzy; he called ENT- Tony Cruz and was told to call us; pt denies n/v, denies f/c/s, he has been walking w/ wife's walker for balance/support; he has mult other somatic complaints...     EXAM shows Afeb, VSS, no nystagmus, O2sat=98% on RA;  HEENT- neg, ears clear, mallampati1;  Chest- clear w/o w/r/r;  Heart- RR w/o m/r/g;  Neuro- appears intact w/o focal abn... IMP/PLAN>>  [facility] Cruz is concerned that the fall, skull fx, subdural that he had Feb2015 could be causing this- we can't get MRI due to cardac pacer, therefore we will check CT Head;  In the interim he will Rx w/ Antivert 25mg  tid prn and do the epley maneuvers Q4-6H; we discussed Neurology eval if symptoms persist...  ADDENDUM>>  CT Head 10/24/15 showed no acute changes- w/o infarct, hemmorhage, mass, etc; there were some chr changes w/ atrophy 7 small vessel dis, no abn enhancement, prev subdural resolved...  ~  January 23, 2016:  15mo ROV & Tony Cruz has recovered fully from prev dizziness episode and no further repercussions from the fall/ skull  fx/ subdural that he had in 2015;  He reports incr prob w/ arthritis- this time in his right knee, pain occurred after bowling, no obvious trauma, etc;  He saw [place] w/ arthritis, poss a sl tear he said & treated conserv w/ shot & his Naprosyn (both helped);  He reports active- golfs 3d/wk, walking some, REC to add silver sneakers!  We reviewed the following medical problems during today's office visit >>     AR> on Claritin & Flonase- note 10% eos on CBC in past; we referred him for hearing eval- he wanted [facility] lab...    OSA> prev followed by Tony Cruz- last seen 2013, he uses Tony Cruz, new machine in 2013, stable on CPAP,  no recent download available & we will try to obtain...    HBP> on ASA81, Cozaar100 & Amlod5; BP= 114/68 & he denies CP, palpit, SOB, edema; Tony Cruz prev stopped his Hct due to dizziness......    HxSyncope, sinus node dysfunction, Pacer> followed by Tony Cruz- last seen 06/2015, he does telechecks, pacer function normally, no issues ident...    Hyperlipidemia>  Chol has been good but TG elev in past; on Fish Oil supplements;  FLP 6/17 showed TChol 154, TG 213, HDL 45, LDL 83 & rec better low fat diet, get wt down.    BorderlineDM, overweight> on diet alone; weight is down 4# to 209# today & he needs to do better; Labs 6/17 showed BS=102, A1c=6.1; we reviewed diet, exercise, wt reduction...     GI- GERD, Barrett's, Divertics, IBS, colon polyp> on Pantoprazole40; known [place], HxBarrett's, prev Rx by Tony Cruz & now Tony Cruz at [facility] w/ EGD 11/14 showing gastritis, esophagitis, gastric polyps (path=+Barretts, reflux related injury, neg HPylori) & f/u EGD planned 32yrs;  Colonoscopy 11/14 showing mod divertics, & 70mm polyp in cecum (tub adenoma)=> f/u 22yrs if eligible.    GU- BPH> on Flomax0.4 & Proscar5; followed by Tony Cruz=> Tony Cruz, last seen 03/2015... Asked to take meds regularly.    DJD, LBP> on Naprosyn, Tramadol, Tylenol; c/o hands and ankles stiff but he still plays golf;  eval by Tony Cruz & he released him "I've done all I can do for you"    Fall 2/15 w/ nondisplaced skull fx and subdural hematoma> treated conservatively, he had some resid HA, dizzy, etc & treated w/ Epley maneuvers- improved...    Periph neuropathy> thorough eval 2011 by Tony Cruz & Tony Cruz- idiopathic; 2nd opinion Tony Cruz- min discomfort & not on specific therapy for this...    Anxious> aware, not on meds... EXAM shows Afeb, VSS, no nystagmus, O2sat=98% on RA;  HEENT- neg, ears clear, mallampati1;  Chest- clear w/o w/r/r;  Heart- RR w/o m/r/g;  Neuro- appears intact w/o focal abn...  LABS 01/23/16:  FLP- Chol ok but TG=213 & needs better diet;  Chems- wnl w/ BS=102, A1c=6.1;  CBC- wnl x 13% eos;  TSH=1.65 IMP/PLAN>>  Tony Cruz is +-stable on his current regimen- continue same, needs better diet, get wt down, etc;  We will try to get CPAP download;  ROV in 71mo...  ~  July 15, 2016:  12mo ROV and pulm/medical check>  Tony Cruz reports a good interval overall, but c/o seeing blood in his stools recently; he is followed for GI by Tony Cruz> On Protonix40 & last seen 05/2013 for EGD & Colon (see below); he recalls hx of rectal fissure in the past & we discussed collecting stool cards to check for occult blood & Rx w/ anusol-HC cream=> he will f/u w/ GI- currently overdue for f/u EGD... we reviewed the following medical problems during today's office visit >>     AR> on Claritin & Flonase OTC- note 10% eos on CBC in past; we referred him for hearing eval- he wanted [facility] lab...    OSA> prev followed by Tony Cruz- last seen 2013, he uses Tony Cruz, new machine in 2013 & it is still working well he says, stable on CPAP- he says he brought in the chip to be read...    HBP> on ASA81, Cozaar100 & Amlod5; BP= 124/78 & he denies CP, palpit, SOB, edema; Tony Cruz prev stopped his Hct due to dizziness......    NEW- mild AI & aneurysmal dilatation Asc Ao at 74mm per CTA 04/2016 w/ yearly CTA vs  MRA recommended...     HxSyncope, sinus node dysfunction, Pacer> followed by Tony Cruz- last seen 06/2016 (note reviewed), hx syncope believed due to intermit sinus node dysfunction=> pacer placed & doing satis; he's also had vasovagal episodes...     Hyperlipidemia>  Chol has been good but TG elev in past; on Fish Oil supplements;  FLP 6/17 showed TChol 154, TG 213, HDL 45, LDL 83 & rec better low fat diet, get wt down.    BorderlineDM, overweight> on diet alone; weight is up 5# to 215# today & he needs to do better; Labs 6/17 showed BS=102, A1c=6.1; we reviewed diet, exercise, wt reduction...     GI- GERD, Barrett's, Divertics, IBS, colon polyp> on Pantoprazole40; known [place], HxBarrett's, followed by Tony Cruz w/ EGD 11/14 showing gastritis, esophagitis, gastric polyps (path=+Barretts, reflux related injury, neg HPylori) & f/u EGD planned 47yrs;  Colonoscopy 11/14 showing mod divertics, & 40mm polyp in cecum (tub adenoma)=> f/u 49yrs if eligible.    GU- BPH> on Flomax0.4 & Proscar5; followed by Tony Cruz=> Tony Cruz, last seen 03/2015... Asked to take meds regularly... He saw their PA recently & reports that PSA was 1.0    DJD, LBP> on Naprosyn, Tramadol, Tylenol; c/o hands and ankles stiff & achilles issues but he still plays golf; eval by [employer] & he released him "I've done all I can do for you"; he saw DrCaffrey 10/17 w/ R knee pain- injected.    Fall 2/15 w/ nondisplaced skull fx and subdural hematoma> treated conservatively, he had some resid HA, dizzy, etc & treated w/ Epley maneuvers- improved...    Periph neuropathy> thorough eval 2011 by Tony Cruz & Tony Cruz- idiopathic; 2nd opinion Tony Cruz- min discomfort & not on specific therapy for this...    Anxious> aware, not on meds...    ?Shingles>  He had an outbreak in the left V1 nerve distribution 03/2016, seen at [facility] & treated w/ valtrex + Doxy; he called [place] & we added Pred; he reports rash resolved, no residuals... EXAM shows Afeb, VSS, O2sat=98% on RA;  HEENT- neg,  ears clear, mallampati1;  Chest- clear w/o w/r/r;  Heart- pacer, RR w/o m/r/g;  Abd- soft, nontender, neg;  Ext- VI tr edema no c/c;  Neuro- appears intact w/o focal abn...  CT Angio Chest 05/21/16> norm heart size, Asc Ao measures 33mm=> aneurysmal, no adenopathy, clear lungs, no effusion, small right hepatic cyst, spondylosis; NOTE> screening annual CTA or MRA recommended.  2DEcho 05/14/16>  Norm LV size & function w/ EF=50-55%, no regional wall motion abn, Gr1DD, mild AI, mild AscAo dilatation, mild LA dil at 53mm... IMP/PLAN>>  Tony Cruz will check stool cards, use the Anusol-HC cream and follow up w/ Tony Cruz as discussed; he needs to incr exercise & in light of his arthritis "I've got arthritis in every joint in my body" rec to use bike vs swimming etc...   ~  August 22, 2016:  11mo ROV & add-on appt due to interval Dx w/ left leg DVT>  Pt presented to the ER 07/26/16 after twisting his ankle ~1wk prior, followed by swelling in left leg, pain, VenDoppler pos for DVT; he denies CP/ SOB/ but did have long drive recently;  Exam showed swelling left lower leg, tender calf, pulses ok;  Labs showed +Factor V Leiden heterozygote;  He was started on XARELTO 15mg Bid => 20mg  Qd... Tony Cruz has mult medical issues as noted above- now adding left leg DVT after fall and hypercoag panel pos w/ FactorV Leiden heterozygote- on Xarelto now.Marland KitchenMarland Kitchen  EXAM shows Afeb, VSS, O2sat=98% on RA;  HEENT- neg, ears clear, mallampati1;  Chest- clear w/o w/r/r;  Heart- pacer, RR, gr1/6 AI murmur LSB w/o r/g;  Abd- soft, nontender, neg;  Ext- VI, swollen left leg, no c/c...  XRay ankle & left Tib/Fib is NEG for fx, mild calcaneal spur...  Ven Doppler Left leg 07/26/16>  Acute DVT involving the left gastroc veins  LABS 07/26/16>  Chems- wnl;  CBC- ok w/ Hg=13.1;  HYPERCOAG Panel- Factor V Leiden is POS for R506Q gene mutation heterozygote (increases thrombosis risk 4-8x) but Prothrombin gene mutation is NEG; other hypercoag tests were  neg/wnl... IMP/PLAN>>  Left leg DVT after fall & long carr ride- abnormal hypercoag panel- Factor V Leiden is POS for R506Q gene mutation heterozygote (increases thrombosis risk 4-8x) but Prothrombin gene mutation is NEG; on Xarelto now & tol well; rec support hose, no salt, elev legs and gradually incr exercise while avoiding unnecessary trauma...            Problem List:   OBSTRUCTIVE SLEEP APNEA (ICD-327.23) - evaluation, diagnosis, and Rx by the [facility] (their notes scanned into the EMR):  he had PSG 4/08 w/ AHI= 26, desat to 83%... placed on CPAP at 11cm H20 pressure... he also has some RLS & was on Requip transiently from the [place]. ~  4/11: pt indicates that he uses his CPAP 4/7 nights for 6-7 hr/night; he feels that he rests well, denies daytime hypersom, snoring, etc... he requests Sleep consult here in [place] (see Tony Cruz consult). ~  5/13: he saw Tony Cruz for Sleep f/u> noted 10# wt gain, pt reported using CPAP compliantly w/ good daytime alertness, no issues w/ mask but requesting new machine (Tony Cruz) as his is >44yrs old; requested to work on wt reduction... ~  6/15: his eval & initial therapy was done by the [facility]; followed by Tony Cruz w/ good compliance & no issues w/ sleep or daytime hypersomnolence.  ALLERGIC RHINITIS (ICD-477.9) - controlled on Claritin, and OTC meds... ~  12/15: we discussed trial of Antihist, Flonase, nasal saline to help his smell & taste...   HYPERTENSION (ICD-401.9) >>  ~  on ASA 81mg /d & HCTZ 25mg /d...  ~  CXR 4/11 is clear and WNL, NAD... ~  6/12:  BP=122/82 today and he denies HA, fatigue, visual changes, CP, palipit, dizziness, syncope, dyspnea, edema, etc... ~  6/13:  BP= 142/98 & he admits intermittently elev at home; we discussed change med to LOSARTAN/ HCT 100/12.5 daily; he will monitor BP & f/u here... ~  11/13:  BP= 132/88 & similar at home; denies CP, palpit, SOB, edema... ~  6/14:  BP= 126/84 & he remains asymptomatic... ~  6/15:  on ASA81, Cozaar100 &  Amlod5; BP= 132/88 & he denies CP, palpit, SOB, edema; Tony Cruz stopped his Hct due to dizziness. ~  12/15: on ASA81, Cozaar100 & Amlod5; BP= 120/84 & he remains essentially asymptomatic... ~  6/16:  on ASA81, Cozaar100 & Amlod5; BP= 142/78 & he denies CP, palpit, SOB, edema; Tony Cruz prev stopped his Hct due to dizziness. ~  6/17: on ASA81, Cozaar100 & Amlod5; BP= 114/68 & he denies CP, palpit, SOB, edema; Tony Cruz prev stopped his Hct due to dizziness  Hx of SYNCOPE (ICD-780.2) & CARDIAC PACEMAKER IN SITU (ICD-V45.01) - he had pacer changed 11/08 [facility] Cruz (hosp DC Summary reviewed)... he continues regular pacer checks and f/u w/ Tony Cruz...  ~  cath 3/99 DrJoeLeB showed norm coronaries, norm LV, norm Ao... ~  He saw [facility] Cruz 12/11  for f/u syncope secondary to sinus node dysfunction, treated w/ pacer & generator replaced 2008;  He noted intercurrent syncope that was felt to be vasovagal in origin (this occurred in the Cards clinic 8/11 while waiting for his wife, & he has a long hx of this from blood draws over the yrs) & Tony Cruz reprogramed his device; EKG w/ SBrady & otherw WNL... ~  He saw Tony Cruz for Cards/EP f/u 12/12> hx syncope related to [facility] dysfunction- devise working properly & no changes made to the programming; TSH & sed rate wetre checked and WNL... ~  Yearly f/u Tony Cruz 12/13> doing satis w/o intercurrent syncope, pacer working satis & f/u planned 12yr... ~  12/14: he had yearly ROV w/ Tony Cruz, doing satis & pacer OK, no changes made... ~  He continues regular f/u w/ Tony Cruz & notes reviewed...  DIABETES MELLITUS, BORDERLINE (ICD-790.29) - he's been concerned about his BS due to [facility] and his neuropathy symptoms, but his BS has only been borderline elvated prev...  ~  labs 4/10 (wt=209#) showed BS= 113, A1c= 6.3.Marland Kitchen. rec> better diet, get wt down. ~  labs 4/11 (wt=222#) showed BS= 109, A1c= 6.2.Marland Kitchen. no meds yet, just needs to get wt down. ~  Labs 6/12 (wt=204#) showed BS= 102, A1v= 6.4 ~   Labs 6/13 (wt=213#) showed BS= 105, A1c= 6.4 ~  Labs 6/14 showed BS= 114, A1c= 6.6 ~  Labs 6/15 showed BS= 121, A1c= 6.4 ~  Labs remain under good control on diet alone...  OVERWEIGHT (ICD-278.02) - we discussed diet + exercise therapy (again)... ~  weight 4/09 = 226#.Marland KitchenMarland Kitchen 5" 9" Tall for a BMI= 33-34 ~  weight 4/10 = 209#... BMI down to 31... keep up the good work. ~  weight 4/11 = 222# .Marland KitchenMarland Kitchen BMI up to 33. ~  Weight 6/12 = 204# ~  Weight 6/13 = 213# ~  Weight 6/14 = 214# ~  Weight 6/15 = 218# ~  Weight 12/15 = 202# keep up the good work... ~  Encouraged re Diet/ Exercise/ etc...  GASTROESOPHAGEAL REFLUX DISEASE (ICD-530.81) w/ BARRETT'S ESOPH - followed by Tony Cruz & Rx w/ OMEPRAZOLE 20mg Bid... ~  prev EGD's documented a [place] & Barrett's esoph... (last 1/03 by DrPatterson). ~  last EGD was 2/08 showing Barrett's epithelium in lower 1/3 of esoph... f/u planned 34yrs. ~  4/11:  NOTE> f/u EGD is due, he will decide on gastroenterologist ==> he saw DrMJohnson w/ EGD 5/11 showing Irreg Z-line & bx c/w Barrett's esoph (no dysplasia), the rest of the esoph, stomach, & duodenum were WNL... ~  EGD 11/14 by Tony Cruz showed gastritis, esophagitis, gastric polyps (path=+Barretts, reflux related injury, neg HPylori)... F/u planned 3 yrs.  DIVERTICULOSIS OF COLON (ICD-562.10)  IRRITABLE BOWEL SYNDROME (ICD-564.1) ~  last colonoscopy 2/08 by Tony Cruz showed left sided divertics, otherw neg... f/u planned 1yrs... ~  Colonoscopy 11/14 by Tony Cruz showed mod divertics, & 75mm polyp in cecum (tub adenoma)... F/u planned 5 yrs if eligible.  BENIGN PROSTATIC HYPERTROPHY, WITH OBSTRUCTION (ICD-600.01) - on FLOMAX 0.4mg /d & PROSCAR 5mg /d... eval and Rx by [facility] who checks pt q46mo (we don't have notes from him)... pt tells me he will be seeing DrDavis regularly when he moves to [facility] ~  9/13: pt reports that he is now seeing Tony Cruz at [facility] & his PSAs have all been normal; his major prob has  been holding his urine & eval revealed not emptying completely- they have tried several meds to help this; he will have notes sent to  [place] for [facility] records.  OSTEOARTHRITIS (ICD-715.90) >> BACK PAIN (ICD-724.5) >>  ~  managed by DrAnderson & difficult problem, thought to have an inflamm arthropathy (?variation of psoriatic arthritis) however he did not respond to NSAIDs/Celebrex, Pred, MTX, anti-TNF inhibitor; on Tramadol prn;  ~  pt saw DrBeane 6/10 for eval> neuropathy symptoms & lumbar spondylosis on XRays, Rx Mobic Prn... offered Myelogram for further eval... he also takes Calcium & Vit D. ~  He was evaluated by Tony Cruz for Rheum => on Naprosyn, Tylenol; c/o hands and ankles stiff but he still plays golf etc; states that they released him "I've done all I can do for you, if you had RA I could help you"  FALL w/ SUBDURAL HEMATOMA >> treated conservatively, he had some resid HA, dizzy, etc & treated w/ Epley maneuvers- improved CT Head 4/15 showed mild atrophy & sm vessel dis & scat lacunes, prev left parietal extra-axial fluid collection is resolved...   FALL w/ Left LEG TRAUMA & subsequent DVT w/ hypercoag panel showing   PERIPHERAL NEUROPATHY (ICD-356.9) - Tony Cruz evaluated him w/ NCV's etc and dx a peripheral neuropathy... this is one of his CC> numbness, decr sensation in feet, (no pain) w/ some assoc balance problems intermittently...  he tried Mentanx but no benefit...  ~  4/11: I reviewed [facility] 11/10 note w/ the patient... ~  He also had a thorough neurologic evaluation 10/11 from one of his church physicians, DrHickling, for f/u polyneuropathy> balance issues, LBP, gait abn, variable paresthesias, foot numbness (sl decr vibration & pin prick), some subjective memory problems w/ norm MMSE, abnormal NCVs;  Prev Labs were all WNL, unrevealing;  etiology of his neuropathy is unknown= cryptogenic, and since it isn't painful he really didn't require any therapy... ~  6/13: he continues  to experience prob w/ balance etc related to his neuropathy; not progressive & still not painful; also notes nocturnal leg cramps & he's tried bar of soap trick, mustard, etc- all w/o relief (we rec trial Tonic water)... ~  2015> Neuro follow up by Tony Cruz for [facility]...  Health Maintenance - he takes a number of Vits + lecithin, saw palmetto, Fish Oil, etc... he saw Tony Cruz in 2010 for Derm review- pt reports nothing major found...   Past Surgical History:  Procedure Laterality Date  . APPENDECTOMY    . CHOLECYSTECTOMY    . PACEMAKER PLACEMENT    . skin cancer removed  12/2012   removed from his face--Dr. Sarajane Jews  . TONSILLECTOMY      Outpatient Encounter Prescriptions as of 08/22/2016  Medication Sig Dispense Refill  . acetaminophen (TYLENOL) 500 MG tablet Take 500-1,000 mg by mouth every 6 (six) hours as needed for headache (or pain).     Marland Kitchen amLODipine (NORVASC) 5 MG tablet Take 1 tablet (5 mg total) by mouth daily. 90 tablet 3  . calcium carbonate (OS-CAL) 600 MG TABS Take 600 mg by mouth daily.      . Cholecalciferol (VITAMIN D) 1000 UNITS capsule Take 1,000 Units by mouth daily.      . finasteride (PROSCAR) 5 MG tablet Take 1 tablet by mouth daily.    . hydrocortisone (ANUSOL-HC) 2.5 % rectal cream Place 1 application rectally 2 (two) times daily. 30 g 6  . Lecithin 1200 MG CAPS Take 1 capsule by mouth daily.      Marland Kitchen loratadine (CLARITIN) 10 MG tablet Take 10 mg by mouth daily.      Marland Kitchen losartan (COZAAR) 100 MG tablet Take  1 tablet by mouth  daily 90 tablet 3  . naproxen (NAPROSYN) 500 MG tablet TAKE 1 TABLET (500MG  TOTAL)2 TIMES DAILY 180 tablet 3  . Omega-3 Fatty Acids (DIALYVITE OMEGA-3 CONCENTRATE) 600 MG CAPS Take 1 capsule by mouth daily.    . pantoprazole (PROTONIX) 40 MG tablet TAKE ONE TABLET DAILY (Patient taking differently: Take 40 mg by mouth in the evening) 90 tablet 1  . PAZEO 0.7 % SOLN Place 1 drop into both eyes daily as needed (for allergies).     . Tamsulosin  HCl (FLOMAX) 0.4 MG CAPS Take 0.4 mg by mouth daily.      . traMADol (ULTRAM) 50 MG tablet Take 1 tablet (50 mg total) by mouth 3 (three) times daily. 270 tablet 1  . [DISCONTINUED] amLODipine (NORVASC) 5 MG tablet TAKE 1 TABLET DAILY 90 tablet 2  . [DISCONTINUED] naproxen (NAPROSYN) 500 MG tablet TAKE 1 TABLET (500MG  TOTAL)2 TIMES DAILY 60 tablet 2  . [DISCONTINUED] Rivaroxaban 15 & 20 MG TBPK Take as directed on package: Start with one 15mg  tablet by mouth twice a day with food. On Day 22, switch to one 20mg  tablet once a day with food. 51 each 0  . [DISCONTINUED] traMADol (ULTRAM) 50 MG tablet Take 1 tablet (50 mg total) by mouth 3 (three) times daily. (Patient taking differently: Take 50-100 mg by mouth See admin instructions. 50 mg in the morning and 100 mg at bedtime) 270 tablet 1  . aspirin 81 MG tablet Take 81 mg by mouth daily.    Marland Kitchen doxycycline (VIBRAMYCIN) 100 MG capsule Take 100 mg by mouth 2 (two) times daily.    . Multiple Vitamin (MULTIVITAMIN) capsule Take 1 capsule by mouth daily.      . rivaroxaban (XARELTO) 20 MG TABS tablet Take 1 tablet (20 mg total) by mouth daily with supper. 90 tablet 1   No facility-administered encounter medications on file as of 08/22/2016.     Allergies  Allergen Reactions  . Shellfish Allergy Rash    Sensitive to shellfish    Current Medications, Allergies, Past Medical History, Past Surgical History, Family History, and Social History were reviewed in [employer] record.   Review of Systems        The patient complains of sleep disorder, dyspnea on exertion, gas/bloating, joint pain, stiffness, arthritis, paresthesias, and difficulty walking.  The patient denies fever, chills, sweats, anorexia, fatigue, weakness, malaise, weight loss, blurring, diplopia, eye irritation, eye discharge, vision loss, eye pain, photophobia, earache, ear discharge, tinnitus, decreased hearing, nasal congestion, nosebleeds, sore throat,  hoarseness, chest pain, palpitations, syncope, orthopnea, PND, peripheral edema, cough, dyspnea at rest, excessive sputum, hemoptysis, wheezing, pleurisy, nausea, vomiting, diarrhea, constipation, change in bowel habits, abdominal pain, melena, hematochezia, jaundice, indigestion/heartburn, dysphagia, odynophagia, dysuria, hematuria, urinary frequency, urinary hesitancy, nocturia, incontinence, back pain, joint swelling, muscle cramps, muscle weakness, sciatica, restless legs, leg pain at night, leg pain with exertion, rash, itching, dryness, suspicious lesions, paralysis, seizures, tremors, vertigo, transient blindness, frequent falls, frequent headaches, depression, anxiety, memory loss, confusion, cold intolerance, heat intolerance, polydipsia, polyphagia, polyuria, unusual weight change, abnormal bruising, bleeding, enlarged lymph nodes, urticaria, allergic rash, hay fever, and recurrent infections.     Objective:   Physical Exam     WD, WN, 83 y/o WM in NAD... GENERAL:  Alert & oriented; pleasant & cooperative... HEENT:  [place]/AT, EOM-wnl, PERRLA, EACs-clear, TMs-wnl, NOSE-clear, THROAT-clear & wnl. NECK:  Supple w/ fairROM; no JVD; normal carotid impulses w/o bruits; no thyromegaly or  nodules palpated; no lymphadenopathy. CHEST:  Clear to P & A; without wheezes/ rales/ or rhonchi. HEART:  Regular Rhythm; without murmurs/ rubs/ or gallops; pacer in left shoulder area... ABDOMEN:  Soft & nontender; normal bowel sounds; no organomegaly or masses detected. EXT: without deformities, mild arthritic changes; no varicose veins/ +venous insuffic/ no edema. NEURO:  CN's intact;  fair gait;  Motor normal, sensory variable deficits... strength seems OK, min decr sensation in LEs. DERM:  No lesions noted; no rash etc...  RADIOLOGY DATA:  Reviewed in the EPIC EMR & discussed w/ the patient...  LABORATORY DATA:  Reviewed in the EPIC EMR & discussed w/ the patient...   Assessment & Plan:    LEFT LEG DVT  & ABN HYPERCOAG PANEL>  Factor V Leiden is POS for R506Q gene mutation heterozygote (increases thrombosis risk 4-8x) but Prothrombin gene mutation is NEG... 08/22/16>   Left leg DVT after fall & long carr ride- abnormal hypercoag panel- Factor V Leiden is POS for R506Q gene mutation heterozygote (increases thrombosis risk 4-8x) but Prothrombin gene mutation is NEG; on Xarelto now & tol well; rec support hose, no salt, elev legs and gradually incr exercise while avoiding unnecessary trauma.   OSA>  Prev followed by Tony Cruz & stable on CPAP; he says he brought chip into [facility]...  HBP>  His BP is improved & stable on Cozaar & Amlodipine, continue same...  Hx Syncope/ Pacer>  Followed by Tony Cruz & pacer functioning normally, no further syncope or vasovagal episodes...  DM, borderline>  On diet alone & managing well, A1c remains 6.1 & we reviewed the import of wt reduction...  GERD, Hx Barrett's>  S/p EGD from Tony Cruz 2014 w/ small segment Barrett's mucosa, no dysplasia; now c/o hematochezia (we will check stools, Rx AnusolHC, & refer back to GI).  Divertics, IBS>  Colonoscopy 11/14 w/ 26mm tub adenoma removed...  BPH>  Followed by Tony Cruz at [facility] on [address]; biggest issue is control & he says it was from not emptying completely, they have tried several meds and continue to follow up regularly, he reports PSAs have been ok; he will get notes to [place]...  DJD>  Followed by Tony Cruz & still having difficulty w/ pain & stiffness...  Periph Neuropathy>  Eval by Neuro, cryptogenic, no meds needed & symptoms remain the same...   Patient's Medications  New Prescriptions   RIVAROXABAN (XARELTO) 20 MG TABS TABLET    Take 1 tablet (20 mg total) by mouth daily with supper.  Previous Medications   ACETAMINOPHEN (TYLENOL) 500 MG TABLET    Take 500-1,000 mg by mouth every 6 (six) hours as needed for headache (or pain).    ASPIRIN 81 MG TABLET    Take 81 mg by mouth daily.   CALCIUM  CARBONATE (OS-CAL) 600 MG TABS    Take 600 mg by mouth daily.     CHOLECALCIFEROL (VITAMIN D) 1000 UNITS CAPSULE    Take 1,000 Units by mouth daily.     DOXYCYCLINE (VIBRAMYCIN) 100 MG CAPSULE    Take 100 mg by mouth 2 (two) times daily.   FINASTERIDE (PROSCAR) 5 MG TABLET    Take 1 tablet by mouth daily.   HYDROCORTISONE (ANUSOL-HC) 2.5 % RECTAL CREAM    Place 1 application rectally 2 (two) times daily.   LECITHIN 1200 MG CAPS    Take 1 capsule by mouth daily.     LORATADINE (CLARITIN) 10 MG TABLET    Take 10 mg by mouth daily.  LOSARTAN (COZAAR) 100 MG TABLET    Take 1 tablet by mouth  daily   MULTIPLE VITAMIN (MULTIVITAMIN) CAPSULE    Take 1 capsule by mouth daily.     OMEGA-3 FATTY ACIDS (DIALYVITE OMEGA-3 CONCENTRATE) 600 MG CAPS    Take 1 capsule by mouth daily.   PANTOPRAZOLE (PROTONIX) 40 MG TABLET    TAKE ONE TABLET DAILY   PAZEO 0.7 % SOLN    Place 1 drop into both eyes daily as needed (for allergies).    TAMSULOSIN HCL (FLOMAX) 0.4 MG CAPS    Take 0.4 mg by mouth daily.    Modified Medications   Modified Medication Previous Medication   AMLODIPINE (NORVASC) 5 MG TABLET amLODipine (NORVASC) 5 MG tablet      Take 1 tablet (5 mg total) by mouth daily.    TAKE 1 TABLET DAILY   NAPROXEN (NAPROSYN) 500 MG TABLET naproxen (NAPROSYN) 500 MG tablet      TAKE 1 TABLET (500MG  TOTAL)2 TIMES DAILY    TAKE 1 TABLET (500MG  TOTAL)2 TIMES DAILY   TRAMADOL (ULTRAM) 50 MG TABLET traMADol (ULTRAM) 50 MG tablet      Take 1 tablet (50 mg total) by mouth 3 (three) times daily.    Take 1 tablet (50 mg total) by mouth 3 (three) times daily.  Discontinued Medications   RIVAROXABAN 15 & 20 MG TBPK    Take as directed on package: Start with one 15mg  tablet by mouth twice a day with food. On Day 22, switch to one 20mg  tablet once a day with food.

## 2016-09-02 DIAGNOSIS — R69 Illness, unspecified: Secondary | ICD-10-CM | POA: Diagnosis not present

## 2016-09-06 DIAGNOSIS — L929 Granulomatous disorder of the skin and subcutaneous tissue, unspecified: Secondary | ICD-10-CM | POA: Diagnosis not present

## 2016-09-06 DIAGNOSIS — Z85828 Personal history of other malignant neoplasm of skin: Secondary | ICD-10-CM | POA: Diagnosis not present

## 2016-09-10 ENCOUNTER — Encounter: Payer: Self-pay | Admitting: Internal Medicine

## 2016-09-10 ENCOUNTER — Ambulatory Visit (INDEPENDENT_AMBULATORY_CARE_PROVIDER_SITE_OTHER): Payer: Medicare HMO | Admitting: Internal Medicine

## 2016-09-10 VITALS — BP 120/78 | HR 84 | Ht 67.5 in | Wt 216.4 lb

## 2016-09-10 DIAGNOSIS — K648 Other hemorrhoids: Secondary | ICD-10-CM

## 2016-09-10 DIAGNOSIS — R140 Abdominal distension (gaseous): Secondary | ICD-10-CM | POA: Diagnosis not present

## 2016-09-10 DIAGNOSIS — K2270 Barrett's esophagus without dysplasia: Secondary | ICD-10-CM

## 2016-09-10 MED ORDER — PANTOPRAZOLE SODIUM 40 MG PO TBEC: 40.0000 mg | DELAYED_RELEASE_TABLET | Freq: Every day | ORAL | 3 refills | Status: DC

## 2016-09-10 NOTE — Patient Instructions (Signed)
Continue pantoprazole 40 mg daily.  We will discontinue further endoscopies and colonoscopies at this time.  Call our office if you decide to have hemorrhoidal banding once you have been okayed to discontinue your xarelto.  If you are age 81 or older, your body mass index should be between 23-30. Your Body mass index is 33.39 kg/m. If this is out of the aforementioned range listed, please consider follow up with your Primary Care Provider.  If you are age 37 or younger, your body mass index should be between 19-25. Your Body mass index is 33.39 kg/m. If this is out of the aformentioned range listed, please consider follow up with your Primary Care Provider.

## 2016-09-10 NOTE — Progress Notes (Signed)
Subjective:    Patient ID: Tony Cruz, male    DOB: Sep 10, 1933, 83 y.o.   MRN: [id number]  HPI Tony Cruz is an 83 year old male with a past medical history of GERD with Barrett's esophagus, internal hemorrhoids and anal fissure who is here for follow-up. He also has a history of DVT diagnosed in the left lower extremity in December 2017 on Xarelto, OSA, hypertension, pacemaker in place.  He reports that he has been doing well. He has no reflux related complaint including no dysphagia or odynophagia. Appetite has been good. No nausea or vomiting. He continues pantoprazole 40 mg daily. No abdominal pain. He does occasionally have loose stools which is been chronic and lifelong for him. He has been diagnosed with "spastic colon" before. He is not using any medication for this at present. He occasionally sees bright red blood with wiping which he has attributed to hemorrhoids. No blood in the stool or melena. No change in bowel habit. He denies diarrhea and constipation. He does occasionally feel some abdominal bloating and gas  Last EGD and colonoscopy in Nov 2014 reviewed with the patient today. EGD showed Barrett's esophagus without dysplasia. Colonoscopy one polyp was removed from the cecum which was 5 mm. He had diverticulosis in the ascending and descending, sigmoid colon. Internal hemorrhoids were found.  Review of Systems As per history of present illness, otherwise negative  Current Medications, Allergies, Past Medical History, Past Surgical History, Family History and Social History were reviewed in [employer] record.     Objective:   Physical Exam BP 120/78   Pulse 84   Ht 5' 7.5" (1.715 m)   Wt 216 lb 6 oz (98.1 kg)   BMI 33.39 kg/m  Constitutional: Well-developed and well-nourished. No distress. HEENT: Normocephalic and atraumatic.  Conjunctivae are normal.  No scleral icterus. Neck: Neck supple. Trachea midline. Cardiovascular: Normal rate,  regular rhythm and intact distal pulses. No M/R/G Pulmonary/chest: Effort normal and breath sounds normal. No wheezing, rales or rhonchi. Abdominal: Soft, nontender, nondistended. Bowel sounds active throughout.  Extremities: no clubbing, cyanosis, or edema Neurological: Alert and oriented to person place and time. Skin: Skin is warm and dry. Psychiatric: Normal mood and affect. Behavior is normal.        Assessment & Plan:  83 year old male with a past medical history of GERD with Barrett's esophagus, internal hemorrhoids and anal fissure who is here for follow-up.  1. GERD with Barrett's esophagus without dysplasia -- surveillance EGD consider today. We discussed the risks, benefits and alternatives. Based on his age, 53 years, we discussed discontinuation of surveillance. I feel that this is reasonable and is his preference. His Barrett's esophagus is been short segment, irregular Z line, without dysplasia. There are currently no alarm symptoms. We will discontinue surveillance endoscopy but I have recommended that he continue pantoprazole 40 mg daily indefinitely.  2. IBS with bloating -- some irritable symptoms along with gas and bloating. These are not new. I recommended he try probiotic specifically VSL 31 month. He was given samples today. He will notify me of the response.  3. Internal hemorrhoids -- intermittent bleeding. Documented at time of last colonoscopy just over 3 years ago. We discussed hemorrhoidal banding as treatment. He is interested but currently on Xarelto. He will likely be off Xarelto after 6 months of anti-coag ration therapy for DVT diagnosed in December 2017. Once he is off anticoagulation therapy if internal hemorrhoids remain symptomatic he will call to schedule banding appointments.  25 minutes spent with the patient today. Greater than 50% was spent in counseling and coordination of care with the patient

## 2016-09-16 ENCOUNTER — Telehealth: Payer: Self-pay | Admitting: Cardiology

## 2016-09-16 ENCOUNTER — Ambulatory Visit (INDEPENDENT_AMBULATORY_CARE_PROVIDER_SITE_OTHER): Payer: Medicare HMO | Admitting: *Deleted

## 2016-09-16 DIAGNOSIS — Z950 Presence of cardiac pacemaker: Secondary | ICD-10-CM

## 2016-09-16 NOTE — Telephone Encounter (Signed)
Spoke with pt and reminded pt of remote transmission that is due today. Pt verbalized understanding.   

## 2016-09-17 ENCOUNTER — Encounter: Payer: Self-pay | Admitting: Cardiology

## 2016-09-17 LAB — CUP PACEART REMOTE DEVICE CHECK
Battery Impedance: 5148 Ohm
Battery Remaining Longevity: 6 mo
Battery Voltage: 2.64 V
Brady Statistic AS VS Percent: 99 %
Implantable Lead Implant Date: 19990330
Implantable Lead Implant Date: 19990330
Implantable Lead Location: 753859
Implantable Lead Location: 753860
Implantable Lead Model: 5068
Implantable Pulse Generator Implant Date: 20081121
Lead Channel Pacing Threshold Pulse Width: 0.4 ms
Lead Channel Pacing Threshold Pulse Width: 0.4 ms
Lead Channel Setting Pacing Amplitude: 2 V
Lead Channel Setting Pacing Amplitude: 2.5 V
Lead Channel Setting Pacing Pulse Width: 0.4 ms
Lead Channel Setting Sensing Sensitivity: 2 mV
MDC IDC MSMT LEADCHNL RA IMPEDANCE VALUE: 758 Ohm
MDC IDC MSMT LEADCHNL RA PACING THRESHOLD AMPLITUDE: 0.625 V
MDC IDC MSMT LEADCHNL RV IMPEDANCE VALUE: 750 Ohm
MDC IDC MSMT LEADCHNL RV PACING THRESHOLD AMPLITUDE: 0.75 V
MDC IDC SESS DTM: 20180219035118
MDC IDC STAT BRADY AP VP PERCENT: 0 %
MDC IDC STAT BRADY AP VS PERCENT: 0 %
MDC IDC STAT BRADY AS VP PERCENT: 1 %

## 2016-09-17 NOTE — Progress Notes (Signed)
Remote pacemaker transmission.   

## 2016-10-17 ENCOUNTER — Ambulatory Visit (INDEPENDENT_AMBULATORY_CARE_PROVIDER_SITE_OTHER): Payer: Medicare HMO | Admitting: *Deleted

## 2016-10-17 DIAGNOSIS — I495 Sick sinus syndrome: Secondary | ICD-10-CM | POA: Diagnosis not present

## 2016-10-17 NOTE — Progress Notes (Signed)
Remote pacemaker transmission.   

## 2016-10-18 ENCOUNTER — Encounter: Payer: Self-pay | Admitting: Cardiology

## 2016-10-19 LAB — CUP PACEART REMOTE DEVICE CHECK
Battery Impedance: 5433 Ohm
Battery Remaining Longevity: 5 mo
Battery Voltage: 2.63 V
Brady Statistic AP VP Percent: 0 %
Brady Statistic AS VP Percent: 1 %
Date Time Interrogation Session: 20180321210123
Implantable Lead Location: 753859
Implantable Lead Model: 5092
Implantable Pulse Generator Implant Date: 20081121
Lead Channel Impedance Value: 750 Ohm
Lead Channel Pacing Threshold Amplitude: 0.625 V
Lead Channel Pacing Threshold Pulse Width: 0.4 ms
Lead Channel Setting Pacing Amplitude: 2 V
Lead Channel Setting Pacing Amplitude: 2.5 V
Lead Channel Setting Pacing Pulse Width: 0.4 ms
MDC IDC LEAD IMPLANT DT: 19990330
MDC IDC LEAD IMPLANT DT: 19990330
MDC IDC LEAD LOCATION: 753860
MDC IDC MSMT LEADCHNL RV IMPEDANCE VALUE: 709 Ohm
MDC IDC MSMT LEADCHNL RV PACING THRESHOLD AMPLITUDE: 0.75 V
MDC IDC MSMT LEADCHNL RV PACING THRESHOLD PULSEWIDTH: 0.4 ms
MDC IDC SET LEADCHNL RV SENSING SENSITIVITY: 2 mV
MDC IDC STAT BRADY AP VS PERCENT: 0 %
MDC IDC STAT BRADY AS VS PERCENT: 99 %

## 2016-11-15 ENCOUNTER — Encounter: Payer: Self-pay | Admitting: Internal Medicine

## 2016-11-18 ENCOUNTER — Ambulatory Visit (INDEPENDENT_AMBULATORY_CARE_PROVIDER_SITE_OTHER): Payer: Self-pay | Admitting: *Deleted

## 2016-11-18 DIAGNOSIS — Z950 Presence of cardiac pacemaker: Secondary | ICD-10-CM

## 2016-11-19 NOTE — Progress Notes (Signed)
Remote pacemaker transmission.   

## 2016-11-21 ENCOUNTER — Encounter: Payer: Self-pay | Admitting: Cardiology

## 2016-11-21 LAB — CUP PACEART REMOTE DEVICE CHECK
Battery Remaining Longevity: 4 mo
Battery Voltage: 2.63 V
Brady Statistic AP VP Percent: 0 %
Brady Statistic AP VS Percent: 0 %
Brady Statistic AS VP Percent: 2 %
Date Time Interrogation Session: 20180419162754
Implantable Lead Implant Date: 19990330
Implantable Lead Location: 753859
Implantable Lead Location: 753860
Implantable Lead Model: 5068
Lead Channel Pacing Threshold Amplitude: 0.625 V
Lead Channel Pacing Threshold Amplitude: 0.875 V
Lead Channel Pacing Threshold Pulse Width: 0.4 ms
Lead Channel Pacing Threshold Pulse Width: 0.4 ms
Lead Channel Setting Pacing Amplitude: 2 V
Lead Channel Setting Pacing Pulse Width: 0.4 ms
MDC IDC LEAD IMPLANT DT: 19990330
MDC IDC MSMT BATTERY IMPEDANCE: 5557 Ohm
MDC IDC MSMT LEADCHNL RA IMPEDANCE VALUE: 754 Ohm
MDC IDC MSMT LEADCHNL RV IMPEDANCE VALUE: 729 Ohm
MDC IDC PG IMPLANT DT: 20081121
MDC IDC SET LEADCHNL RV PACING AMPLITUDE: 2.5 V
MDC IDC SET LEADCHNL RV SENSING SENSITIVITY: 2 mV
MDC IDC STAT BRADY AS VS PERCENT: 98 %

## 2016-12-08 ENCOUNTER — Other Ambulatory Visit: Payer: Self-pay | Admitting: Pulmonary Disease

## 2016-12-20 ENCOUNTER — Ambulatory Visit (INDEPENDENT_AMBULATORY_CARE_PROVIDER_SITE_OTHER): Payer: Self-pay | Admitting: *Deleted

## 2016-12-20 DIAGNOSIS — Z950 Presence of cardiac pacemaker: Secondary | ICD-10-CM

## 2016-12-22 ENCOUNTER — Encounter: Payer: Self-pay | Admitting: Internal Medicine

## 2016-12-24 LAB — CUP PACEART REMOTE DEVICE CHECK
Battery Remaining Longevity: 3 mo
Brady Statistic AP VP Percent: 0 %
Brady Statistic AP VS Percent: 0 %
Brady Statistic AS VP Percent: 2 %
Date Time Interrogation Session: 20180527201953
Implantable Lead Implant Date: 19990330
Implantable Lead Model: 5092
Lead Channel Impedance Value: 724 Ohm
Lead Channel Impedance Value: 726 Ohm
Lead Channel Pacing Threshold Amplitude: 0.625 V
Lead Channel Pacing Threshold Amplitude: 0.875 V
Lead Channel Pacing Threshold Pulse Width: 0.4 ms
Lead Channel Setting Sensing Sensitivity: 2.8 mV
MDC IDC LEAD IMPLANT DT: 19990330
MDC IDC LEAD LOCATION: 753859
MDC IDC LEAD LOCATION: 753860
MDC IDC MSMT BATTERY IMPEDANCE: 5735 Ohm
MDC IDC MSMT BATTERY VOLTAGE: 2.61 V
MDC IDC MSMT LEADCHNL RV PACING THRESHOLD PULSEWIDTH: 0.4 ms
MDC IDC PG IMPLANT DT: 20081121
MDC IDC SET LEADCHNL RA PACING AMPLITUDE: 2 V
MDC IDC SET LEADCHNL RV PACING AMPLITUDE: 2.5 V
MDC IDC SET LEADCHNL RV PACING PULSEWIDTH: 0.4 ms
MDC IDC STAT BRADY AS VS PERCENT: 97 %

## 2016-12-24 NOTE — Progress Notes (Signed)
Remote pacemaker transmission.   

## 2016-12-27 ENCOUNTER — Encounter: Payer: Self-pay | Admitting: Cardiology

## 2016-12-27 DIAGNOSIS — D1801 Hemangioma of skin and subcutaneous tissue: Secondary | ICD-10-CM | POA: Diagnosis not present

## 2016-12-27 DIAGNOSIS — L570 Actinic keratosis: Secondary | ICD-10-CM | POA: Diagnosis not present

## 2016-12-27 DIAGNOSIS — L723 Sebaceous cyst: Secondary | ICD-10-CM | POA: Diagnosis not present

## 2016-12-27 DIAGNOSIS — L578 Other skin changes due to chronic exposure to nonionizing radiation: Secondary | ICD-10-CM | POA: Diagnosis not present

## 2016-12-27 DIAGNOSIS — L821 Other seborrheic keratosis: Secondary | ICD-10-CM | POA: Diagnosis not present

## 2016-12-27 DIAGNOSIS — L905 Scar conditions and fibrosis of skin: Secondary | ICD-10-CM | POA: Diagnosis not present

## 2016-12-27 DIAGNOSIS — Z85828 Personal history of other malignant neoplasm of skin: Secondary | ICD-10-CM | POA: Diagnosis not present

## 2017-01-10 ENCOUNTER — Ambulatory Visit (INDEPENDENT_AMBULATORY_CARE_PROVIDER_SITE_OTHER): Payer: Medicare HMO | Admitting: Physician Assistant

## 2017-01-10 ENCOUNTER — Ambulatory Visit (INDEPENDENT_AMBULATORY_CARE_PROVIDER_SITE_OTHER): Payer: Medicare HMO

## 2017-01-10 ENCOUNTER — Encounter: Payer: Self-pay | Admitting: Physician Assistant

## 2017-01-10 VITALS — BP 121/70 | HR 85 | Temp 98.3°F | Resp 16 | Ht 67.64 in | Wt 215.0 lb

## 2017-01-10 DIAGNOSIS — L97909 Non-pressure chronic ulcer of unspecified part of unspecified lower leg with unspecified severity: Secondary | ICD-10-CM | POA: Diagnosis not present

## 2017-01-10 DIAGNOSIS — L97511 Non-pressure chronic ulcer of other part of right foot limited to breakdown of skin: Secondary | ICD-10-CM | POA: Diagnosis not present

## 2017-01-10 DIAGNOSIS — R21 Rash and other nonspecific skin eruption: Secondary | ICD-10-CM

## 2017-01-10 DIAGNOSIS — L03818 Cellulitis of other sites: Secondary | ICD-10-CM

## 2017-01-10 DIAGNOSIS — B351 Tinea unguium: Secondary | ICD-10-CM

## 2017-01-10 LAB — POCT CBC
Granulocyte percent: 50.3 %G (ref 37–80)
HEMATOCRIT: 30.9 % — AB (ref 43.5–53.7)
HEMOGLOBIN: 10.6 g/dL — AB (ref 14.1–18.1)
Lymph, poc: 3 (ref 0.6–3.4)
MCH: 28.5 pg (ref 27–31.2)
MCHC: 34.3 g/dL (ref 31.8–35.4)
MCV: 83 fL (ref 80–97)
MID (CBC): 0.8 (ref 0–0.9)
MPV: 6.5 fL (ref 0–99.8)
POC GRANULOCYTE: 3.9 (ref 2–6.9)
POC LYMPH PERCENT: 38.9 %L (ref 10–50)
POC MID %: 10.8 % (ref 0–12)
Platelet Count, POC: 372 10*3/uL (ref 142–424)
RBC: 3.73 M/uL — AB (ref 4.69–6.13)
RDW, POC: 15.9 %
WBC: 7.7 10*3/uL (ref 4.6–10.2)

## 2017-01-10 LAB — GLUCOSE, POCT (MANUAL RESULT ENTRY): POC Glucose: 112 mg/dl — AB (ref 70–99)

## 2017-01-10 MED ORDER — CEPHALEXIN 500 MG PO CAPS: 500.0000 mg | ORAL_CAPSULE | Freq: Three times a day (TID) | ORAL | 0 refills | Status: AC

## 2017-01-10 NOTE — Progress Notes (Signed)
Tony Cruz  MRN: [id number] DOB: 17-Apr-1934  Subjective:  Tony Cruz is a 83 y.o. male seen in office today for a chief complaint of 2nd right toe pain x 2 weeks. Notes he had a blister on the bottom of it a few weeks ago but then it turned red. Has associated swelling and warmth. Has some discomfort.  Denies any pus drainage, fever, and chills. Has tried vaseline and epson salt for the past 2 weeks with no relief. Notes the redness is getting worse. Has had bilateral neuropathy in feet for the past 15 years. No dx of diabetes. He has an appointment with the podiatrist on 01/13/17.   Review of Systems Per HPI  Patient Active Problem List   Diagnosis Date Noted  . DVT (deep venous thrombosis) ([place]) 08/22/2016  . History of skull fracture 10/19/2015  . History of subdural hematoma 10/19/2015  . Headache(784.0) 09/30/2013  . Sinoatrial node dysfunction (H[facility]) 06/01/2013  . Pacemaker-MDT 07/02/2011  . Night sweats 07/02/2011  . DIZZINESS 03/22/2010  . Impaired fasting glucose 11/02/2009  . Obstructive sleep apnea 11/21/2008  . Backache 11/03/2008  . Overweight 11/02/2007  . Essential hypertension 11/02/2007  . ALLERGIC RHINITIS 11/02/2007  . GASTROESOPHAGEAL REFLUX DISEASE 11/02/2007  . Diverticulosis of large intestine 11/02/2007  . Benign prostatic hyperplasia with urinary obstruction 11/02/2007  . SYNCOPE 11/02/2007  . Hereditary and idiopathic peripheral neuropathy 10/30/2007  . IRRITABLE BOWEL SYNDROME 10/30/2007  . Osteoarthritis 10/30/2007    Current Outpatient Prescriptions on File Prior to Visit  Medication Sig Dispense Refill  . acetaminophen (TYLENOL) 500 MG tablet Take 500-1,000 mg by mouth every 6 (six) hours as needed for headache (or pain).     Marland Kitchen amLODipine (NORVASC) 5 MG tablet Take 1 tablet (5 mg total) by mouth daily. 90 tablet 3  . calcium carbonate (OS-CAL) 600 MG TABS Take 600 mg by mouth daily.      . Cholecalciferol (VITAMIN D) 1000 UNITS capsule  Take 1,000 Units by mouth daily.      . finasteride (PROSCAR) 5 MG tablet Take 1 tablet by mouth daily.    . Lecithin 1200 MG CAPS Take 1 capsule by mouth daily.      Marland Kitchen loratadine (CLARITIN) 10 MG tablet Take 10 mg by mouth daily.      Marland Kitchen losartan (COZAAR) 100 MG tablet Take 1 tablet by mouth  daily 90 tablet 3  . Multiple Vitamin (MULTIVITAMIN) capsule Take 1 capsule by mouth daily.      . naproxen (NAPROSYN) 500 MG tablet TAKE 1 TABLET (500MG  TOTAL)2 TIMES DAILY 180 tablet 3  . Omega-3 Fatty Acids (DIALYVITE OMEGA-3 CONCENTRATE) 600 MG CAPS Take 1 capsule by mouth daily.    . pantoprazole (PROTONIX) 40 MG tablet TAKE ONE TABLET DAILY 90 tablet 1  . PAZEO 0.7 % SOLN Place 1 drop into both eyes daily as needed (for allergies).     . rivaroxaban (XARELTO) 20 MG TABS tablet Take 1 tablet (20 mg total) by mouth daily with supper. 90 tablet 1  . Tamsulosin HCl (FLOMAX) 0.4 MG CAPS Take 0.4 mg by mouth daily.      . traMADol (ULTRAM) 50 MG tablet Take 1 tablet (50 mg total) by mouth 3 (three) times daily. 270 tablet 1  . aspirin 81 MG tablet Take 81 mg by mouth daily.    . hydrocortisone (ANUSOL-HC) 2.5 % rectal cream Place 1 application rectally 2 (two) times daily. (Patient not taking: Reported on 01/10/2017) 30 g  6   No current facility-administered medications on file prior to visit.     Allergies  Allergen Reactions  . Shellfish Allergy Rash    Sensitive to shellfish     Objective:  BP 121/70 (BP Location: Right Arm, Patient Position: Sitting, Cuff Size: Large)   Pulse 85   Temp 98.3 F (36.8 C) (Oral)   Resp 16   Ht 5' 7.64" (1.718 m)   Wt 215 lb (97.5 kg)   SpO2 96%   BMI 33.04 kg/m   Physical Exam  Constitutional: He is oriented to person, place, and time and well-developed, well-nourished, and in no distress.  HENT:  Head: Normocephalic and atraumatic.  Eyes: Conjunctivae are normal.  Neck: Normal range of motion.  Pulmonary/Chest: Effort normal.  Neurological: He is  alert and oriented to person, place, and time. Gait normal.  Skin: Skin is warm and dry.  0.5 cm skin erosion noted on the inferior aspect of the 2nd right toe. There is surrounding erythema and warmth extending to the superior aspect of the toe. No purulent drainage or necrotic tissue noted. 2nd toenail is thickened with yellow discoloration. See image below.   There is mild tenderness with palpation of 2nd toe. Decreased sensation with filament testing (baseline for patient). Strength and ROM intact.   Psychiatric: Affect normal.  Vitals reviewed.    Results for orders placed or performed in visit on 01/10/17 (from the past 24 hour(s))  POCT glucose (manual entry)     Status: Abnormal   Collection Time: 01/10/17  4:31 PM  Result Value Ref Range   POC Glucose 112 (A) 70 - 99 mg/dl  POCT CBC     Status: Abnormal   Collection Time: 01/10/17  4:32 PM  Result Value Ref Range   WBC 7.7 4.6 - 10.2 K/uL   Lymph, poc 3.0 0.6 - 3.4   POC LYMPH PERCENT 38.9 10 - 50 %L   MID (cbc) 0.8 0 - 0.9   POC MID % 10.8 0 - 12 %M   POC Granulocyte 3.9 2 - 6.9   Granulocyte percent 50.3 37 - 80 %G   RBC 3.73 (A) 4.69 - 6.13 M/uL   Hemoglobin 10.6 (A) 14.1 - 18.1 g/dL   HCT, POC 30.9 (A) 43.5 - 53.7 %   MCV 83.0 80 - 97 fL   MCH, POC 28.5 27 - 31.2 pg   MCHC 34.3 31.8 - 35.4 g/dL   RDW, POC 15.9 %   Platelet Count, POC 372 142 - 424 K/uL   MPV 6.5 0 - 99.8 fL   Dg Toe 2nd Right  Result Date: 01/10/2017 CLINICAL DATA:  Ulcer and cellulitis of the second right toe. EXAM: RIGHT SECOND TOE COMPARISON:  None. FINDINGS: There is no evidence of fracture or dislocation. No evidence of cortical destruction. There is no evidence of arthropathy or other focal bone abnormality. Soft tissue swelling of the second right digit. IMPRESSION: No evidence of fracture, dislocation or radiographically apparent osteomyelitis. Electronically Signed   By: Fidela Salisbury M.D.   On: 01/10/2017 16:46          Assessment and Plan :  1. Skin ulcer of second toe of right foot, limited to breakdown of skin ([place]) 2. Cellulitis of other specified site 3. Onychomycosis POCT labs and plain films reassuring. Pt has decreased sensation, however, this is his baseline. The wound does not warrant debridement at this time. Will treat cellulitis with antibiotic. Wound dressed with nonadherent bandage. Wound care  instructions given. Plan to follow up with podiatry on Monday, 01/13/17 for further evaluation.  - POCT CBC - POCT glucose (manual entry) - DG Toe 2nd Right; Future - cephALEXin (KEFLEX) 500 MG capsule; Take 1 capsule (500 mg total) by mouth 3 (three) times daily.  Dispense: 21 capsule; Refill: 0  Tenna Delaine, PA-C  [facility] [place] 01/12/2017 9:19 AM

## 2017-01-10 NOTE — Patient Instructions (Signed)
Your point of care labs and xray are reassuring. There are no signs of elevated white count or bone infection. Take antibiotics as prescribed. Take with food to avoid GI upset. Keep the foot covered and dry. Follow up with podiatry on Monday. Return sooner if symptoms worsen. Thank you for letting me participate in your health and well being.  Cellulitis, Adult Cellulitis is a skin infection. The infected area is usually red and sore. This condition occurs most often in the arms and lower legs. It is very important to get treated for this condition. Follow these instructions at home:  Take over-the-counter and prescription medicines only as told by your doctor.  If you were prescribed an antibiotic medicine, take it as told by your doctor. Do not stop taking the antibiotic even if you start to feel better.  Drink enough fluid to keep your pee (urine) clear or pale yellow.  Do not touch or rub the infected area.  Raise (elevate) the infected area above the level of your heart while you are sitting or lying down.  Place warm or cold wet cloths (warm or cold compresses) on the infected area. Do this as told by your doctor.  Keep all follow-up visits as told by your doctor. This is important. These visits let your doctor make sure your infection is not getting worse. Contact a doctor if:  You have a fever.  Your symptoms do not get better after 1-2 days of treatment.  Your bone or joint under the infected area starts to hurt after the skin has healed.  Your infection comes back. This can happen in the same area or another area.  You have a swollen bump in the infected area.  You have new symptoms.  You feel ill and also have muscle aches and pains. Get help right away if:  Your symptoms get worse.  You feel very sleepy.  You throw up (vomit) or have watery poop (diarrhea) for a long time.  There are red streaks coming from the infected area.  Your red area gets larger.  Your  red area turns darker. This information is not intended to replace advice given to you by your health care provider. Make sure you discuss any questions you have with your health care provider. Document Released: 01/01/2008 Document Revised: 12/21/2015 Document Reviewed: 05/24/2015 Elsevier Interactive Patient Education  2018 [employer].

## 2017-01-13 ENCOUNTER — Ambulatory Visit (INDEPENDENT_AMBULATORY_CARE_PROVIDER_SITE_OTHER): Payer: Medicare HMO | Admitting: Pulmonary Disease

## 2017-01-13 ENCOUNTER — Encounter: Payer: Self-pay | Admitting: Pulmonary Disease

## 2017-01-13 VITALS — BP 122/78 | HR 75 | Temp 97.2°F | Ht 69.0 in | Wt 212.2 lb

## 2017-01-13 DIAGNOSIS — G609 Hereditary and idiopathic neuropathy, unspecified: Secondary | ICD-10-CM | POA: Diagnosis not present

## 2017-01-13 DIAGNOSIS — D6851 Activated protein C resistance: Secondary | ICD-10-CM | POA: Insufficient documentation

## 2017-01-13 DIAGNOSIS — M150 Primary generalized (osteo)arthritis: Secondary | ICD-10-CM

## 2017-01-13 DIAGNOSIS — G4733 Obstructive sleep apnea (adult) (pediatric): Secondary | ICD-10-CM

## 2017-01-13 DIAGNOSIS — D649 Anemia, unspecified: Secondary | ICD-10-CM

## 2017-01-13 DIAGNOSIS — L97511 Non-pressure chronic ulcer of other part of right foot limited to breakdown of skin: Secondary | ICD-10-CM | POA: Diagnosis not present

## 2017-01-13 DIAGNOSIS — I495 Sick sinus syndrome: Secondary | ICD-10-CM

## 2017-01-13 DIAGNOSIS — L97519 Non-pressure chronic ulcer of other part of right foot with unspecified severity: Secondary | ICD-10-CM | POA: Diagnosis not present

## 2017-01-13 DIAGNOSIS — I10 Essential (primary) hypertension: Secondary | ICD-10-CM

## 2017-01-13 DIAGNOSIS — E7800 Pure hypercholesterolemia, unspecified: Secondary | ICD-10-CM

## 2017-01-13 DIAGNOSIS — Z950 Presence of cardiac pacemaker: Secondary | ICD-10-CM

## 2017-01-13 DIAGNOSIS — I82402 Acute embolism and thrombosis of unspecified deep veins of left lower extremity: Secondary | ICD-10-CM | POA: Diagnosis not present

## 2017-01-13 DIAGNOSIS — M2041 Other hammer toe(s) (acquired), right foot: Secondary | ICD-10-CM | POA: Diagnosis not present

## 2017-01-13 DIAGNOSIS — Z8679 Personal history of other diseases of the circulatory system: Secondary | ICD-10-CM

## 2017-01-13 DIAGNOSIS — M159 Polyosteoarthritis, unspecified: Secondary | ICD-10-CM

## 2017-01-13 DIAGNOSIS — L97509 Non-pressure chronic ulcer of other part of unspecified foot with unspecified severity: Secondary | ICD-10-CM | POA: Insufficient documentation

## 2017-01-13 DIAGNOSIS — M2042 Other hammer toe(s) (acquired), left foot: Secondary | ICD-10-CM | POA: Diagnosis not present

## 2017-01-13 NOTE — Patient Instructions (Signed)
Today we updated your med list in our EPIC system...    Continue your current medications the same...  Please return to our lab one morning this week for your FASTING blood work... Please also collect the "stool cards" so we can check for hidden blood due to your anemia...    We will contact you w/ the results when available...   We will schedule both VENOUS and ARTERIAL Dopplers to check the veins and arteries of your legs...  Keep your Podiatry & Dermatology appts for the 2nd toe ulcer...  Call for any questions or if we can be of service in any way.Marland KitchenMarland Kitchen

## 2017-01-13 NOTE — Progress Notes (Addendum)
Subjective:    Patient ID: Tony Cruz, male    DOB: 1934-02-19, 83 y.o.   MRN: [id number]  HPI 83 y/o WM here for a follow up visit... he has multiple medical problems as noted below...  ~  SEE PREV EPIC NOTES FOR OLDER DATA >>    LABS 7/14:  FLP- ok x TG=190 on diet alone;  Chems- ok x BS=114 A1c=6.6;  CBC- wnl;  TSH=1.27;  PSA=0.89;  Uric=5.9.Marland KitchenMarland Kitchen  CXR 6/15 showed normal heart size, mild tortuous Ao, pacer, clear lungs, NAD...  LABS 6/15:  FLP- wnl on diet;  Chems- wnl x BS=121, A1c=6.4;  CBC- wnl;  TSH=1.73;  VitD=49...   ~  January 03, 2015:  40mo Tony Cruz reports stable- "just aging" he says;  He brought a list of complaints/ questions today:  They are disrupted at home w/ kitchen remodel; notes his urine is darker than usual (UA is clear- asked to incr water intake); taking Naprosyn & worried about renal (Cr=0.89 & reassured); +FamHx DM & he wants another A1c (BS=114, A1c=6.0);  Neuropathy eval by [facility] Cruz- reviewed w/ pt- not a lot of pain, mild burning, friend takes Gabapentin; disturbance of taste & worried about B12, Zinc, Copper (B12=594); leg cramps in thigh area- trying mustard, Tumeric; athritis pains- stiff, swelling, esp in hands... We reviewed the following medical problems during today's office visit >>     AR> on Claritin & Flonase added for "swelling" sensation... Note 10% eos on CBC...    OSA> followed by [facility] Cruz, stable on CPAP, continue same...    HBP> on ASA81, Cozaar100 & Amlod5; BP= 142/78 & he denies CP, palpit, SOB, edema; Tony Cruz prev stopped his Hct due to dizziness......    HxSyncope, sinus node dysfunction, Pacer> followed by [facility] Cruz, pacer function normally, no issues ident...    BorderlineDM, overweight> on diet alone; weight is down 3# to 198# today; Labs 6/16 showed BS=114, A1c=6.0; we reviewed diet, exercise, wt reduction...     GI- GERD, Barrett's, Divertics, IBS> on Pantoprazole40; known [place], HxBarrett's, prev Rx by [facility] Cruz & now Tony Cruz at [facility]  w/ EGD 11/14 showing gastritis, esophagitis, gastric polyps (path=+Barretts, reflux related injury, neg HPylori); colonoscopy 11/14 showing mod divertics, & 60mm polyp in cecum (tub adenoma)...    GU- BPH> on Flomax0.4 & Proscar5; followed by Tony Cruz=> [facility] Cruz... Asked to take meds regularly.    DJD, LBP> on Naprosyn, Tramadol, Tylenol; c/o hands and ankles stiff but he still plays golf; eval by Tony Cruz & he released him "I've done all I can do for you"    Fall 2/15 w/ nondisplaced skull fx and subdural hematoma> treated conservatively, he had some resid HA, dizzy, etc & treated w/ [facility] Cruz maneuvers- improved...    Periph neuropathy> thorough eval 2011 by Tony Cruz & [facility] Cruz- idiopathic; 2nd opinion Tony Cruz- min discomfort & not on specific therapy for this...    Anxious> aware, not on meds... We reviewed prob list, meds, xrays and labs> see below for updates >>   CXR 6/16 showed norm heart size, pacer, clear lungs, NAD...  LABS 6/16:  FLP- at goals on diet;  Chems- ok x BS=114, A1c=6.0;  CBC- wnl x 10%eos;  TSH=1.23;  B12=594;  UA- clear, neg protein.... PLAN>>  All questions answered, stable on current med regimen; Flonase added, call for problems...  ~  July 04, 2015:  41mo Tony Cruz reports a good interval- feeling well & no new complaints or concerns... He requested refer to [facility] for hearing eval &  allergy referral to [place] for testing due to jellow jacket stings...    Hx OSA on CPAP prev followed by [facility] Cruz; gets mask & tubing changed Q76mo via Tony Cruz, rests well 6-8H/night, using CPAP every night, wakes rested & no daytime sleep pressure or issues w/ alertness...    BP controlled on Amlod5 & Losar100, measures 118/72 today & he denies HA, CP, palpit, SOB, edema, etc; pacer checked by [facility] Cruz & stable...     Weight = 205# which is up 7# from last OV 7 we reviewed diet, exercise, wt reducing strategies...     GI & GU stable on Protonix40 & Proscar5+Flomax0.4.Marland KitchenMarland Kitchen    He has DJD, LBP,  and fell 2/15 w/ skull fx & subdural, neuropathy> he saw Tony Cruz w/ rec for Aleve and exercise! Tony Cruz is retiring- on [employer] 7 notes that he hurts everyday.    Derm- Tony Cruz removed an inflamed cyst from his back... EXAM shows Afeb, VSS, O2sat=97% on RA;  HEENT- neg, mallampati2;  Chest- clear w/o w/r/r;  Heart- RR w/o m/r/g;  Abd-  Soft, nontender, neg;  Ext- neg w/o c/c/e;  Neuro- neuropathy, no other focal abn... IMP/PLAN>>  Tony Cruz is stable, continue same meds, we plan ROV in 80mo...   ~  October 19, 2015:  [name] & add-on appt trequested for dizziness> pt c/o balance off, says it started 10/08/15- just awoke dizzy & thought it was prob recurrent vertigo like in 2015; he took OTC [place] & a nurse at his church demonstrated the [facility] Cruz maneuvers; he improved enough to play a round of golf- walked 18 holes- but noticed he was worse again the next day. Noted that when he rolled over in bed he was very dizzy; he called ENT- Tony Cruz and was told to call us; pt denies n/v, denies f/c/s, he has been walking w/ wife's walker for balance/support; he has mult other somatic complaints...     EXAM shows Afeb, VSS, no nystagmus, O2sat=98% on RA;  HEENT- neg, ears clear, mallampati1;  Chest- clear w/o w/r/r;  Heart- RR w/o m/r/g;  Neuro- appears intact w/o focal abn... IMP/PLAN>>  Tony Cruz is concerned that the fall, skull fx, subdural that he had Feb2015 could be causing this- we can't get MRI due to cardac pacer, therefore we will check CT Head;  In the interim he will Rx w/ Antivert 25mg  tid prn and do the Tony Cruz maneuvers Q4-6H; we discussed Neurology eval if symptoms persist...  ADDENDUM>>  CT Head 10/24/15 showed no acute changes- w/o infarct, hemmorhage, mass, etc; there were some chr changes w/ atrophy 7 small vessel dis, no abn enhancement, prev subdural resolved...  ~  January 23, 2016:  15mo ROV & Tony Cruz has recovered fully from prev dizziness episode and no further repercussions from the fall/ skull  fx/ subdural that he had in 2015;  He reports incr prob w/ arthritis- this time in his right knee, pain occurred after bowling, no obvious trauma, etc;  He saw [place] w/ arthritis, poss a sl tear he said & treated conserv w/ shot & his Naprosyn (both helped);  He reports active- golfs 3d/wk, walking some, REC to add silver sneakers!  We reviewed the following medical problems during today's office visit >>     AR> on Claritin & Flonase- note 10% eos on CBC in past; we referred him for hearing eval- he wanted [facility] lab...    OSA> prev followed by Tony Cruz- last seen 2013, he uses Tony Cruz, new machine in 2013, stable on CPAP,  no recent download available & we will try to obtain...    HBP> on ASA81, Cozaar100 & Amlod5; BP= 114/68 & he denies CP, palpit, SOB, edema; Tony Cruz prev stopped his Hct due to dizziness......    HxSyncope, sinus node dysfunction, Pacer> followed by Tony Cruz- last seen 06/2015, he does telechecks, pacer function normally, no issues ident...    Hyperlipidemia>  Chol has been good but TG elev in past; on Fish Oil supplements;  FLP 6/17 showed TChol 154, TG 213, HDL 45, LDL 83 & rec better low fat diet, get wt down.    BorderlineDM, overweight> on diet alone; weight is down 4# to 209# today & he needs to do better; Labs 6/17 showed BS=102, A1c=6.1; we reviewed diet, exercise, wt reduction...     GI- GERD, Barrett's, Divertics, IBS, colon polyp> on Pantoprazole40; known [place], HxBarrett's, prev Rx by Tony Cruz & now Tony Cruz at [facility] w/ EGD 11/14 showing gastritis, esophagitis, gastric polyps (path=+Barretts, reflux related injury, neg HPylori) & f/u EGD planned 32yrs;  Colonoscopy 11/14 showing mod divertics, & 70mm polyp in cecum (tub adenoma)=> f/u 22yrs if eligible.    GU- BPH> on Flomax0.4 & Proscar5; followed by Tony Cruz=> [facility] Cruz, last seen 03/2015... Asked to take meds regularly.    DJD, LBP> on Naprosyn, Tramadol, Tylenol; c/o hands and ankles stiff but he still plays golf;  eval by Tony Cruz & he released him "I've done all I can do for you"    Fall 2/15 w/ nondisplaced skull fx and subdural hematoma> treated conservatively, he had some resid HA, dizzy, etc & treated w/ Tony Cruz maneuvers- improved...    Periph neuropathy> thorough eval 2011 by Tony Cruz & Tony Cruz- idiopathic; 2nd opinion Tony Cruz- min discomfort & not on specific therapy for this...    Anxious> aware, not on meds... EXAM shows Afeb, VSS, no nystagmus, O2sat=98% on RA;  HEENT- neg, ears clear, mallampati1;  Chest- clear w/o w/r/r;  Heart- RR w/o m/r/g;  Neuro- appears intact w/o focal abn...  LABS 01/23/16:  FLP- Chol ok but TG=213 & needs better diet;  Chems- wnl w/ BS=102, A1c=6.1;  CBC- wnl x 13% eos;  TSH=1.65 IMP/PLAN>>  Sly is +-stable on his current regimen- continue same, needs better diet, get wt down, etc;  We will try to get CPAP download;  ROV in 71mo...  ~  July 15, 2016:  12mo ROV and pulm/medical check>  Lavan reports a good interval overall, but c/o seeing blood in his stools recently; he is followed for GI by Tony Cruz> On Protonix40 & last seen 05/2013 for EGD & Colon (see below); he recalls hx of rectal fissure in the past & we discussed collecting stool cards to check for occult blood & Rx w/ anusol-HC cream=> he will f/u w/ GI- currently overdue for f/u EGD... we reviewed the following medical problems during today's office visit >>     AR> on Claritin & Flonase OTC- note 10% eos on CBC in past; we referred him for hearing eval- he wanted [facility] lab...    OSA> prev followed by Tony Cruz- last seen 2013, he uses [facility] Cruz, new machine in 2013 & it is still working well he says, stable on CPAP- he says he brought in the chip to be read...    HBP> on ASA81, Cozaar100 & Amlod5; BP= 124/78 & he denies CP, palpit, SOB, edema; [facility] Cruz prev stopped his Hct due to dizziness......    NEW- mild AI & aneurysmal dilatation Asc Ao at 74mm per CTA 04/2016 w/ yearly CTA vs  MRA recommended...     HxSyncope, sinus node dysfunction, Pacer> followed by [facility]- last seen 06/2016 (note reviewed), hx syncope believed due to intermit sinus node dysfunction=> pacer placed & doing satis; he's also had vasovagal episodes...     Hyperlipidemia>  Chol has been good but TG elev in past; on Fish Oil supplements;  FLP 6/17 showed TChol 154, TG 213, HDL 45, LDL 83 & rec better low fat diet, get wt down.    BorderlineDM, overweight> on diet alone; weight is up 5# to 215# today & he needs to do better; Labs 6/17 showed BS=102, A1c=6.1; we reviewed diet, exercise, wt reduction...     GI- GERD, Barrett's, Divertics, IBS, colon polyp, hems> on Pantoprazole40; known [place], HxBarrett's, followed by Tony Cruz w/ EGD 11/14 showing gastritis, esophagitis, gastric polyps (path=+Barretts, reflux related injury, neg HPylori) & f/u EGD planned 62yrs;  Colonoscopy 11/14 showing mod divertics, & 4mm polyp in cecum (tub adenoma)=> f/u 65yrs if eligible.    GU- BPH> on Flomax0.4 & Proscar5; followed by Tony Cruz=> [facility] Cruz, last seen 03/2015... Asked to take meds regularly... He saw their PA recently & reports that PSA was 1.0    DJD, LBP> on Naprosyn, Tramadol, Tylenol; c/o hands and ankles stiff & achilles issues but he still plays golf; eval by [employer] & he released him "I've done all I can do for you"; he saw DrCaffrey 10/17 w/ R knee pain- injected.    Fall 2/15 w/ nondisplaced skull fx and subdural hematoma> treated conservatively, he had some resid HA, dizzy, etc & treated w/ [facility] Cruz maneuvers- improved...    Periph neuropathy> thorough eval 2011 by Tony Cruz & [facility] Cruz- idiopathic; 2nd opinion Tony Cruz- min discomfort & not on specific therapy for this...    Anxious> aware, not on meds...    ?Shingles>  He had an outbreak in the left V1 nerve distribution 03/2016, seen at [facility] & treated w/ valtrex + Doxy; he called [place] & we added Pred; he reports rash resolved, no residuals... EXAM shows Afeb, VSS, O2sat=98% on RA;  HEENT-  neg, ears clear, mallampati1;  Chest- clear w/o w/r/r;  Heart- pacer, RR w/o m/r/g;  Abd- soft, nontender, neg;  Ext- VI tr edema no c/c;  Neuro- appears intact w/o focal abn...  CT Angio Chest 05/21/16> norm heart size, Asc Ao measures 39mm=> aneurysmal, no adenopathy, clear lungs, no effusion, small right hepatic cyst, spondylosis; NOTE> screening annual CTA or MRA recommended.  2DEcho 05/14/16>  Norm LV size & function w/ EF=50-55%, no regional wall motion abn, Gr1DD, mild AI, mild AscAo dilatation, mild LA dil at 85mm... IMP/PLAN>>  Jeffie will check stool cards, use the Anusol-HC cream and follow up w/ Tony Cruz as discussed; he needs to incr exercise & in light of his arthritis "I've got arthritis in every joint in my body" rec to use bike vs swimming etc...   ~  August 22, 2016:  60mo ROV & add-on appt due to interval Dx w/ left leg DVT>  Pt presented to the ER 07/26/16 after twisting his ankle ~1wk prior, followed by swelling in left leg, pain, VenDoppler pos for DVT; he denies CP/ SOB/ but did have long drive recently;  Exam showed swelling left lower leg, tender calf, pulses ok;  Labs showed +Factor V Leiden heterozygote;  He was started on XARELTO 15mg Bid => 20mg  Qd... Dreden has mult medical issues as noted above- now adding left leg DVT after fall and hypercoag panel pos w/ FactorV Leiden heterozygote- on Xarelto now.Marland KitchenMarland Kitchen  EXAM shows Afeb, VSS, O2sat=98% on RA;  HEENT- neg, ears clear, mallampati1;  Chest- clear w/o w/r/r;  Heart- pacer, RR, gr1/6 AI murmur LSB w/o r/g;  Abd- soft, nontender, neg;  Ext- VI, swollen left leg, no c/c...  XRay ankle & left Tib/Fib is NEG for fx, mild calcaneal spur...  Ven Doppler Left leg 07/26/16>  Acute DVT involving the left gastroc veins  LABS 07/26/16>  Chems- wnl;  CBC- ok w/ Hg=13.1;  HYPERCOAG Panel- Factor V Leiden is POS for R506Q gene mutation heterozygote (increases thrombosis risk 4-8x) but Prothrombin gene mutation is NEG; other hypercoag tests  were neg/wnl... IMP/PLAN>>  Left leg DVT after fall & long car ride- abnormal hypercoag panel- Factor V Leiden is POS for R506Q gene mutation heterozygote (increases thrombosis risk 4-8x) but Prothrombin gene mutation is NEG; on Xarelto now & tol well; rec support hose, no salt, elev legs and gradually incr exercise while avoiding unnecessary trauma...    ~  January 13, 2017:  9mo ROV & Corbin is here for a CPX he says>  He reports developing a blister on the tip of his right 2nd toe- why? (he has no idea but suspect rubbing/ pressure area; he initially treated himself, then went to [facility] & given Keflex, finally went to [facility]- [facility] ("it's healing OK) and Podiatry to trim onychomycosis nails;  He remains on Xarelto20 for his prev left gastroc vein DVT & finding of Factor V heterozygote mutation; notes left calf still feels tight & exam shows decr in DP pulse bilat;  Further complicating the picture => he is Anemic w/ Hg=10.6, mcv=83, and has a signif GI history w/ GERD, 5cmHH, Barrett's esoph, Divertics, IBS, hx colon adenomas, Hems- on Protonix40/d & AnusolHC cream prn, followed by [facility] Cruz & seen 09/10/16 while doing satis (Note reviewed- last EGD & Colon was 05/2013 and they decided to discontinue surveillance, discussed poss Hem banding but on Xarelto)...   Current active/ complex issues>>        Right 2nd toe ulcer at tip>  Art dopplers are pending       Left leg DVT on Xarelto20>  f/u venous dopplers 01/22/17 showed chronic left gastroc vein DVT; this chr DVT + his hypercoag panel results means we will need to continue anticoagulation, REC to decr to Xarelto15 due to bleed.       Abn hypercoag panel w/ Factor V Leiden POS for R506Q gene mutation- heterozygote> aware       GI- GERD, Barrett's, Divertics, IBS, colon polyp, hems> on Pantoprazole40; known [place], HxBarrett's, followed by [facility] Cruz w/ EGD 11/14 showing gastritis, esophagitis, gastric polyps (path=+Barretts, reflux related injury, neg HPylori) &  Colonoscopy 11/14 showing mod divertics, & 2mm polyp in cecum (tub adenoma)=> pt seen by [facility] Cruz 09/10/16 & they opted to discontinue screening f/u...       Anemia , Iron deficient, & heme pos stools> we will start pt on FeSO4 + VitC and ask GI to recheck pt in light of his mult GI problems, need for chr anticoagulation, and heme pos stools. Medical Problem List>>     AR> on Claritin & Flonase OTC- note 10% eos on CBC in past; we referred him for hearing eval- he wanted [place] lab...    OSA> prev followed by [facility]- last seen 2013, he uses [facility], new machine in 2013 & it is still working well he says, stable on CPAP- but we cannot get download from the chip...    HBP> on ASA81, [place];  BP= 122/78 & he denies CP, palpit, SOB, edema; [facility] Cruz prev stopped his Hct due to dizziness...Marland KitchenMarland KitchenMarland Kitchen    mild AI & aneurysmal dilatation Asc Ao at 75mm per CTA 04/2016 w/ yearly CTA vs MRA recommended...    HxSyncope, sinus node dysfunction, Pacer> followed by [facility]- last seen 06/2016 (note reviewed), hx syncope believed due to intermit sinus node dysfunction=> pacer placed & doing satis; he's also had vasovagal episodes...     Hyperlipidemia>  Chol has been good but TG elev in past; on Fish Oil supplements;  FLP 6/18 showed TChol 136, TG 142, HDL 41, LDL 66 & rec to conjtinue low fat diet & get wt down.    BorderlineDM, overweight> on diet alone; weight is stable ~212# but he needs to do better; Labs 6/17 showed BS=102, A1c=6.1; we reviewed diet, exercise, wt reduction; FBS 6/18 = 119    GU- BPH> on Flomax0.4 & Proscar5; followed by [facility]=> [facility], last seen 03/2015... Asked to take meds regularly... He saw their PA recently & reports that PSA was 1.0    DJD, LBP> on Naprosyn, Tramadol, Tylenol; c/o hands and ankles stiff & achilles issues but he still plays golf; eval by [employer] & he released him "I've done all I can do for you"; he saw DrCaffrey 10/17 w/ R knee pain- injected.    Fall 2/15 w/  nondisplaced skull fx and subdural hematoma> treated conservatively, he had some resid HA, dizzy, etc & treated w/ [facility] Cruz maneuvers- improved...    Periph neuropathy> thorough eval 2011 by Tony Cruz & [facility]- idiopathic; 2nd opinion [facility]- min discomfort & not on specific therapy for this...    Anxious> aware, not on meds...    ?Shingles>  He had an outbreak in the left V1 nerve distribution 03/2016, seen at [facility] & treated w/ valtrex + Doxy; he called [place] & we added Pred; he reports rash resolved, no residuals...  EXAM shows Afeb, VSS, O2sat=98% on RA;  HEENT- pale, neg, ears clear, mallampati1;  Chest- clear w/o w/r/r;  Heart- pacer, RR, gr1/6 AI murmur LSB w/o r/g;  Abd- soft, nontender, neg;  Ext- VI, sl swollen left leg- still feels "tighter", no c/c;  Derm- right 2nd toe wrapped & he has appt w/ podiatry later today (we did not unwrap it)...  EKG 01/13/17 showed SBrady, rate50, otherw WNL...  f/u Venous Dopplers of LES>  POS for chronic DVT in left gastroc veins...  Arterial Dopplers of LEs> pending  LABS 01/14/17>  FLP- ok all parameters at goals on FishOil;  Chems- ok x BS=119;  CBC- anemic w/ Hg=10.7, mcv=85, Eos=12%;  Stool heme pos;  Fe=36 (8%sat), Ferritin=8.2;  B12=434;  TSH=2.54 IMP/PLAN>>  As outlined above, complex series of problems that all intersect> Hx DVT 12/17 after fall w/ left ankle injury & left gastroc vein DVT on Dopplers; hypercoag panel done in ER 12/17 before anticoagulated showed that he is a Factor V Leiden heterozygote- POS for R506Q gene mutation; he has been on Xarelto anticoagulation (20mg /d) and recently found to have iron defic anemia w/ heme pos stools=> we will treat w/ FeSO4 325mg /d + VitC, decision made to decr his Xarelto to 15mg /d, and refer back to GI- [facility] Cruz regarding his mult GI diagnoses and heme pos stool... We will recheck pt in 39mo.  ADDENDUM>>  Art Dopplers of LEs done 02/11/17>  No evid of arterial dis w/ norm ABIs, norm TBIs etc...            Problem List:   OBSTRUCTIVE SLEEP  APNEA (ICD-327.23) - evaluation, diagnosis, and Rx by the [facility] (their notes scanned into the EMR):  he had PSG 4/08 w/ AHI= 26, desat to 83%... placed on CPAP at 11cm H20 pressure... he also has some RLS & was on Requip transiently from the [place]. ~  4/11: pt indicates that he uses his CPAP 4/7 nights for 6-7 hr/night; he feels that he rests well, denies daytime hypersom, snoring, etc... he requests Sleep consult here in [place] (see [facility] Cruz consult). ~  5/13: he saw [facility] for Sleep f/u> noted 10# wt gain, pt reported using CPAP compliantly w/ good daytime alertness, no issues w/ mask but requesting new machine ([facility]) as his is >49yrs old; requested to work on wt reduction... ~  6/15: his eval & initial therapy was done by the [facility]; followed by [facility] w/ good compliance & no issues w/ sleep or daytime hypersomnolence.  ALLERGIC RHINITIS (ICD-477.9) - controlled on Claritin, and OTC meds... ~  12/15: we discussed trial of Antihist, Flonase, nasal saline to help his smell & taste...   HYPERTENSION (ICD-401.9) >>  ~  on ASA 81mg /d & HCTZ 25mg /d...  ~  CXR 4/11 is clear and WNL, NAD... ~  6/12:  BP=122/82 today and he denies HA, fatigue, visual changes, CP, palipit, dizziness, syncope, dyspnea, edema, etc... ~  6/13:  BP= 142/98 & he admits intermittently elev at home; we discussed change med to LOSARTAN/ HCT 100/12.5 daily; he will monitor BP & f/u here... ~  11/13:  BP= 132/88 & similar at home; denies CP, palpit, SOB, edema... ~  6/14:  BP= 126/84 & he remains asymptomatic... ~  6/15:  on ASA81, Cozaar100 & Amlod5; BP= 132/88 & he denies CP, palpit, SOB, edema; [facility] stopped his Hct due to dizziness. ~  12/15: on ASA81, Cozaar100 & Amlod5; BP= 120/84 & he remains essentially asymptomatic... ~  6/16:  on ASA81, Cozaar100 & Amlod5; BP= 142/78 & he denies CP, palpit, SOB, edema; [facility] Cruz prev stopped his Hct due to dizziness. ~  6/17: on ASA81, Cozaar100 & Amlod5;  BP= 114/68 & he denies CP, palpit, SOB, edema; [facility] prev stopped his Hct due to dizziness  Hx of SYNCOPE (ICD-780.2) & CARDIAC PACEMAKER IN SITU (ICD-V45.01) - he had pacer changed 11/08 [facility] (hosp DC Summary reviewed)... he continues regular pacer checks and f/u w/ [facility]...  ~  cath 3/99 DrJoeLeB showed norm coronaries, norm LV, norm Ao... ~  He saw [facility] 12/11 for f/u syncope secondary to sinus node dysfunction, treated w/ pacer & generator replaced 2008;  He noted intercurrent syncope that was felt to be vasovagal in origin (this occurred in the Cards clinic 8/11 while waiting for his wife, & he has a long hx of this from blood draws over the yrs) & [facility] reprogramed his device; EKG w/ SBrady & otherw WNL... ~  He saw [facility] for Cards/EP f/u 12/12> hx syncope related to [facility] dysfunction- devise working properly & no changes made to the programming; TSH & sed rate wetre checked and WNL... ~  Yearly f/u [facility] 12/13> doing satis w/o intercurrent syncope, pacer working satis & f/u planned 20yr... ~  12/14: he had yearly ROV w/ [facility], doing satis & pacer OK, no changes made... ~  He continues regular f/u w/ [facility] & notes reviewed...  DIABETES MELLITUS, BORDERLINE (ICD-790.29) - he's been concerned about his BS due to [facility] and his neuropathy symptoms, but his BS has only been borderline elvated prev...  ~  labs 4/10 (wt=209#) showed BS=  113, A1c= 6.3.Marland Kitchen. rec> better diet, get wt down. ~  labs 4/11 (wt=222#) showed BS= 109, A1c= 6.2.Marland Kitchen. no meds yet, just needs to get wt down. ~  Labs 6/12 (wt=204#) showed BS= 102, A1v= 6.4 ~  Labs 6/13 (wt=213#) showed BS= 105, A1c= 6.4 ~  Labs 6/14 showed BS= 114, A1c= 6.6 ~  Labs 6/15 showed BS= 121, A1c= 6.4 ~  Labs remain under good control on diet alone...  OVERWEIGHT (ICD-278.02) - we discussed diet + exercise therapy (again)... ~  weight 4/09 = 226#.Marland KitchenMarland Kitchen 5" 9" Tall for a BMI= 33-34 ~  weight 4/10 = 209#... BMI down to 31... keep up the good  work. ~  weight 4/11 = 222# .Marland KitchenMarland Kitchen BMI up to 33. ~  Weight 6/12 = 204# ~  Weight 6/13 = 213# ~  Weight 6/14 = 214# ~  Weight 6/15 = 218# ~  Weight 12/15 = 202# keep up the good work... ~  Encouraged re Diet/ Exercise/ etc...  GASTROESOPHAGEAL REFLUX DISEASE (ICD-530.81) w/ BARRETT'S ESOPH - followed by Tony Cruz & Rx w/ OMEPRAZOLE 20mg Bid... ~  prev EGD's documented a Bardolph & Barrett's esoph... (last 1/03 by DrPatterson). ~  last EGD was 2/08 showing Barrett's epithelium in lower 1/3 of esoph... f/u planned 66yrs. ~  4/11:  NOTE> f/u EGD is due, he will decide on gastroenterologist ==> he saw DrMJohnson w/ EGD 5/11 showing Irreg Z-line & bx c/w Barrett's esoph (no dysplasia), the rest of the esoph, stomach, & duodenum were WNL... ~  EGD 11/14 by Tony Cruz showed gastritis, esophagitis, gastric polyps (path=+Barretts, reflux related injury, neg HPylori)... F/u planned 3 yrs.  DIVERTICULOSIS OF COLON (ICD-562.10)  IRRITABLE BOWEL SYNDROME (ICD-564.1) ~  last colonoscopy 2/08 by Tony Cruz showed left sided divertics, otherw neg... f/u planned 3yrs... ~  Colonoscopy 11/14 by Tony Cruz showed mod divertics, & 36mm polyp in cecum (tub adenoma)... F/u planned 5 yrs if eligible.  BENIGN PROSTATIC HYPERTROPHY, WITH OBSTRUCTION (ICD-600.01) - on FLOMAX 0.4mg /d & PROSCAR 5mg /d... eval and Rx by [facility] who checks pt q22mo (we don't have notes from him)... pt tells me he will be seeing DrDavis regularly when he [facility] to [facility]. ~  9/13: pt reports that he is now seeing Tony Cruz at [facility] & his PSAs have all been normal; his major prob has been holding his urine & eval revealed not emptying completely- they have tried several meds to help this; he will have notes sent to [place] for [facility] records.  OSTEOARTHRITIS (ICD-715.90) >> BACK PAIN (ICD-724.5) >>  ~  managed by DrAnderson & difficult problem, thought to have an inflamm arthropathy (?variation of psoriatic arthritis) however he did not respond to  NSAIDs/Celebrex, Pred, MTX, anti-TNF inhibitor; on Tramadol prn;  ~  pt saw DrBeane 6/10 for eval> neuropathy symptoms & lumbar spondylosis on XRays, Rx Mobic Prn... offered Myelogram for further eval... he also takes Calcium & Vit D. ~  He was evaluated by Tony Cruz for Rheum => on Naprosyn, Tylenol; c/o hands and ankles stiff but he still plays golf etc; states that they released him "I've done all I can do for you, if you had RA I could help you"  FALL w/ SUBDURAL HEMATOMA >> treated conservatively, he had some resid HA, dizzy, etc & treated w/ Tony Cruz maneuvers- improved CT Head 4/15 showed mild atrophy & sm vessel dis & scat lacunes, prev left parietal extra-axial fluid collection is resolved...   FALL w/ Left LEG TRAUMA & subsequent DVT w/ hypercoag panel showing   PERIPHERAL  NEUROPATHY (ICD-356.9) - Tony Cruz evaluated him w/ NCV's etc and dx a peripheral neuropathy... this is one of his CC> numbness, decr sensation in feet, (no pain) w/ some assoc balance problems intermittently...  he tried Mentanx but no benefit...  ~  4/11: I reviewed Tony Cruz 11/10 note w/ the patient... ~  He also had a thorough neurologic evaluation 10/11 from one of his church physicians, DrHickling, for f/u polyneuropathy> balance issues, LBP, gait abn, variable paresthesias, foot numbness (sl decr vibration & pin prick), some subjective memory problems w/ norm MMSE, abnormal NCVs;  Prev Labs were all WNL, unrevealing;  etiology of his neuropathy is unknown= cryptogenic, and since it isn't painful he really didn't require any therapy... ~  6/13: he continues to experience prob w/ balance etc related to his neuropathy; not progressive & still not painful; also notes nocturnal leg cramps & he's tried bar of soap trick, mustard, etc- all w/o relief (we rec trial Tonic water)... ~  2015> Neuro follow up by Tony Cruz for [facility]...  Health Maintenance - he takes a number of Vits + lecithin, saw palmetto, Fish Oil, etc...  he saw Tony Cruz in 2010 for Derm review- pt reports nothing major found...   Past Surgical History:  Procedure Laterality Date  . APPENDECTOMY    . CHOLECYSTECTOMY    . PACEMAKER PLACEMENT    . skin cancer removed  12/2012   removed from his face--Dr. Sarajane Jews  . TONSILLECTOMY      Outpatient Encounter Prescriptions as of 01/13/2017  Medication Sig  . acetaminophen (TYLENOL) 500 MG tablet Take 500-1,000 mg by mouth every 6 (six) hours as needed for headache (or pain).   Marland Kitchen amLODipine (NORVASC) 5 MG tablet Take 1 tablet (5 mg total) by mouth daily.  . calcium carbonate (OS-CAL) 600 MG TABS Take 600 mg by mouth daily.    . cephALEXin (KEFLEX) 500 MG capsule Take 1 capsule (500 mg total) by mouth 3 (three) times daily.  . Cholecalciferol (VITAMIN D) 1000 UNITS capsule Take 1,000 Units by mouth daily.    . finasteride (PROSCAR) 5 MG tablet Take 1 tablet by mouth daily.  . hydrocortisone (ANUSOL-HC) 2.5 % rectal cream Place 1 application rectally 2 (two) times daily.  . Lecithin 1200 MG CAPS Take 1 capsule by mouth daily.    Marland Kitchen loratadine (CLARITIN) 10 MG tablet Take 10 mg by mouth daily.    Marland Kitchen losartan (COZAAR) 100 MG tablet Take 1 tablet by mouth  daily  . Multiple Vitamin (MULTIVITAMIN) capsule Take 1 capsule by mouth daily.    . naproxen (NAPROSYN) 500 MG tablet TAKE 1 TABLET (500MG  TOTAL)2 TIMES DAILY  . Omega-3 Fatty Acids (DIALYVITE OMEGA-3 CONCENTRATE) 600 MG CAPS Take 1 capsule by mouth daily.  . pantoprazole (PROTONIX) 40 MG tablet TAKE ONE TABLET DAILY  . PAZEO 0.7 % SOLN Place 1 drop into both eyes daily as needed (for allergies).   . rivaroxaban (XARELTO) 20 MG TABS tablet Take 1 tablet (20 mg total) by mouth daily with supper.  . Tamsulosin HCl (FLOMAX) 0.4 MG CAPS Take 0.4 mg by mouth daily.    . traMADol (ULTRAM) 50 MG tablet Take 1 tablet (50 mg total) by mouth 3 (three) times daily.  . [DISCONTINUED] aspirin 81 MG tablet Take 81 mg by mouth daily.   No facility-administered  encounter medications on file as of 01/13/2017.     Allergies  Allergen Reactions  . Shellfish Allergy Rash    Sensitive to shellfish  Immunization History  Administered Date(s) Administered  . 19-influenza Whole 04/09/2016  . Influenza Split 05/30/2011, 06/04/2012, 04/18/2016  . Influenza Whole 08/16/2009  . Influenza,inj,Quad PF,36+ Mos 04/04/2014  . Influenza-Unspecified 05/29/2013, 05/04/2015  . Pneumococcal Conjugate-13 12/28/2013, 04/18/2016  . Rabies, IM 09/28/2012, 09/30/2012, 10/04/2012, 10/11/2012  . Rabies, intradermal 09/28/2012  . Tdap 06/04/2012  . Zoster 07/29/2008    Current Medications, Allergies, Past Medical History, Past Surgical History, Family History, and Social History were reviewed in [employer] record.   Review of Systems        The patient complains of sleep disorder, dyspnea on exertion, gas/bloating, joint pain, stiffness, arthritis, paresthesias, and difficulty walking.  The patient denies fever, chills, sweats, anorexia, fatigue, weakness, malaise, weight loss, blurring, diplopia, eye irritation, eye discharge, vision loss, eye pain, photophobia, earache, ear discharge, tinnitus, decreased hearing, nasal congestion, nosebleeds, sore throat, hoarseness, chest pain, palpitations, syncope, orthopnea, PND, peripheral edema, cough, dyspnea at rest, excessive sputum, hemoptysis, wheezing, pleurisy, nausea, vomiting, diarrhea, constipation, change in bowel habits, abdominal pain, melena, hematochezia, jaundice, indigestion/heartburn, dysphagia, odynophagia, dysuria, hematuria, urinary frequency, urinary hesitancy, nocturia, incontinence, back pain, joint swelling, muscle cramps, muscle weakness, sciatica, restless legs, leg pain at night, leg pain with exertion, rash, itching, dryness, suspicious lesions, paralysis, seizures, tremors, vertigo, transient blindness, frequent falls, frequent headaches, depression, anxiety, memory loss,  confusion, cold intolerance, heat intolerance, polydipsia, polyphagia, polyuria, unusual weight change, abnormal bruising, bleeding, enlarged lymph nodes, urticaria, allergic rash, hay fever, and recurrent infections.     Objective:   Physical Exam     WD, WN, 83 y/o WM in NAD... GENERAL:  Alert & oriented; pleasant & cooperative... HEENT:  [place]/AT, EOM-wnl, PERRLA, EACs-clear, TMs-wnl, NOSE-clear, THROAT-clear & wnl. NECK:  Supple w/ fairROM; no JVD; normal carotid impulses w/o bruits; no thyromegaly or nodules palpated; no lymphadenopathy. CHEST:  Clear to P & A; without wheezes/ rales/ or rhonchi. HEART:  Regular Rhythm; without murmurs/ rubs/ or gallops; pacer in left shoulder area... ABDOMEN:  Soft & nontender; normal bowel sounds; no organomegaly or masses detected. EXT: without deformities, mild arthritic changes; no varicose veins/ +venous insuffic/ no edema. NEURO:  CN's intact;  fair gait;  Motor normal, sensory variable deficits... strength seems OK, min decr sensation in LEs. DERM:  No lesions noted; no rash etc...  RADIOLOGY DATA:  Reviewed in the EPIC EMR & discussed w/ the patient...  LABORATORY DATA:  Reviewed in the EPIC EMR & discussed w/ the patient...   Assessment & Plan:    LEFT LEG DVT & ABN HYPERCOAG PANEL>  Factor V Leiden is POS for R506Q gene mutation heterozygote (increases thrombosis risk 4-8x) but Prothrombin gene mutation is NEG... 08/22/16>   Left leg DVT after fall & long carr ride- abnormal hypercoag panel- Factor V Leiden is POS for R506Q gene mutation heterozygote (increases thrombosis risk 4-8x) but Prothrombin gene mutation is NEG; on Xarelto now & tol well; rec support hose, no salt, elev legs and gradually incr exercise while avoiding unnecessary trauma.   OSA>  Prev followed by Tony Cruz & stable on CPAP; he says he brought chip into Tony Cruz...  HBP>  His BP is improved & stable on Cozaar & Amlodipine, continue same...  Hx Syncope/ Pacer>  Followed  by Tony Cruz & pacer functioning normally, no further syncope or vasovagal episodes...  DM, borderline>  On diet alone & managing well, A1c remains 6.1 & we reviewed the import of wt reduction...  GERD, Hx Barrett's>  S/p EGD from Tony Cruz 2014  w/ small segment Barrett's mucosa, no dysplasia; now c/o hematochezia (we will check stools, Rx AnusolHC, & refer back to GI).  Divertics, IBS>  Colonoscopy 11/14 w/ 83mm tub adenoma removed...  BPH>  Followed by Tony Cruz at [facility] on [address]; biggest issue is control & he says it was from not emptying completely, they have tried several meds and continue to follow up regularly, he reports PSAs have been ok; he will get notes to [place]...  DJD>  Followed by Tony Cruz & still having difficulty w/ pain & stiffness...  Periph Neuropathy>  Eval by Neuro, cryptogenic, no meds needed & symptoms remain the same...   Patient's Medications  New Prescriptions      FeSO4 325mg /d + VitC 500mg /d  Previous Medications   ACETAMINOPHEN (TYLENOL) 500 MG TABLET    Take 500-1,000 mg by mouth every 6 (six) hours as needed for headache (or pain).    AMLODIPINE (NORVASC) 5 MG TABLET    Take 1 tablet (5 mg total) by mouth daily.   CALCIUM CARBONATE (OS-CAL) 600 MG TABS    Take 600 mg by mouth daily.     CHOLECALCIFEROL (VITAMIN D) 1000 UNITS CAPSULE    Take 1,000 Units by mouth daily.     FINASTERIDE (PROSCAR) 5 MG TABLET    Take 1 tablet by mouth daily.   HYDROCORTISONE (ANUSOL-HC) 2.5 % RECTAL CREAM    Place 1 application rectally 2 (two) times daily.   LECITHIN 1200 MG CAPS    Take 1 capsule by mouth daily.     LORATADINE (CLARITIN) 10 MG TABLET    Take 10 mg by mouth daily.     LOSARTAN (COZAAR) 100 MG TABLET    Take 1 tablet by mouth  daily   MULTIPLE VITAMIN (MULTIVITAMIN) CAPSULE    Take 1 capsule by mouth daily.     OMEGA-3 FATTY ACIDS (DIALYVITE OMEGA-3 CONCENTRATE) 600 MG CAPS    Take 1 capsule by mouth daily.   PANTOPRAZOLE (PROTONIX) 40 MG  TABLET    TAKE ONE TABLET DAILY   PAZEO 0.7 % SOLN    Place 1 drop into both eyes daily as needed (for allergies).    RIVAROXABAN (XARELTO) 15 MG TABS TABLET    Take 1 tablet (15 mg total) by mouth daily with supper.   TAMSULOSIN HCL (FLOMAX) 0.4 MG CAPS    Take 0.4 mg by mouth daily.     TRAMADOL (ULTRAM) 50 MG TABLET    Take 1 tablet (50 mg total) by mouth 3 (three) times daily.  Modified Medications   No medications on file  Discontinued Medications   ASPIRIN 81 MG TABLET    Take 81 mg by mouth daily.

## 2017-01-14 ENCOUNTER — Other Ambulatory Visit (INDEPENDENT_AMBULATORY_CARE_PROVIDER_SITE_OTHER): Payer: Medicare HMO

## 2017-01-14 ENCOUNTER — Other Ambulatory Visit: Payer: Self-pay | Admitting: Pulmonary Disease

## 2017-01-14 DIAGNOSIS — I82409 Acute embolism and thrombosis of unspecified deep veins of unspecified lower extremity: Secondary | ICD-10-CM

## 2017-01-14 DIAGNOSIS — I10 Essential (primary) hypertension: Secondary | ICD-10-CM

## 2017-01-14 DIAGNOSIS — E7800 Pure hypercholesterolemia, unspecified: Secondary | ICD-10-CM

## 2017-01-14 DIAGNOSIS — D6851 Activated protein C resistance: Secondary | ICD-10-CM

## 2017-01-14 LAB — CBC WITH DIFFERENTIAL/PLATELET
Basophils Absolute: 0.2 10*3/uL — ABNORMAL HIGH (ref 0.0–0.1)
Basophils Relative: 2.4 % (ref 0.0–3.0)
EOS PCT: 12.2 % — AB (ref 0.0–5.0)
Eosinophils Absolute: 1.1 10*3/uL — ABNORMAL HIGH (ref 0.0–0.7)
HEMATOCRIT: 32.8 % — AB (ref 39.0–52.0)
HEMOGLOBIN: 10.7 g/dL — AB (ref 13.0–17.0)
LYMPHS PCT: 37.6 % (ref 12.0–46.0)
Lymphs Abs: 3.3 10*3/uL (ref 0.7–4.0)
MCHC: 32.5 g/dL (ref 30.0–36.0)
MCV: 85.3 fl (ref 78.0–100.0)
MONO ABS: 0.9 10*3/uL (ref 0.1–1.0)
MONOS PCT: 10.5 % (ref 3.0–12.0)
Neutro Abs: 3.3 10*3/uL (ref 1.4–7.7)
Neutrophils Relative %: 37.3 % — ABNORMAL LOW (ref 43.0–77.0)
Platelets: 336 10*3/uL (ref 150.0–400.0)
RBC: 3.84 Mil/uL — AB (ref 4.22–5.81)
RDW: 16.1 % — ABNORMAL HIGH (ref 11.5–15.5)
WBC: 8.8 10*3/uL (ref 4.0–10.5)

## 2017-01-14 LAB — COMPREHENSIVE METABOLIC PANEL
ALK PHOS: 53 U/L (ref 39–117)
ALT: 17 U/L (ref 0–53)
AST: 16 U/L (ref 0–37)
Albumin: 3.8 g/dL (ref 3.5–5.2)
BILIRUBIN TOTAL: 0.5 mg/dL (ref 0.2–1.2)
BUN: 16 mg/dL (ref 6–23)
CALCIUM: 8.9 mg/dL (ref 8.4–10.5)
CO2: 26 mEq/L (ref 19–32)
Chloride: 102 mEq/L (ref 96–112)
Creatinine, Ser: 0.89 mg/dL (ref 0.40–1.50)
GFR: 87.17 mL/min (ref >60.00)
Glucose, Bld: 119 mg/dL — ABNORMAL HIGH (ref 70–99)
POTASSIUM: 4.1 meq/L (ref 3.5–5.1)
Sodium: 135 mEq/L (ref 135–145)
TOTAL PROTEIN: 6.3 g/dL (ref 6.0–8.3)

## 2017-01-14 LAB — TSH: TSH: 2.54 u[IU]/mL (ref 0.35–4.50)

## 2017-01-14 LAB — LIPID PANEL
CHOLESTEROL: 136 mg/dL (ref 0–200)
HDL: 41.3 mg/dL (ref >39.00)
LDL Cholesterol: 66 mg/dL (ref 0–99)
NonHDL: 94.88
TRIGLYCERIDES: 142 mg/dL (ref 0.0–149.0)
Total CHOL/HDL Ratio: 3
VLDL: 28.4 mg/dL (ref 0.0–40.0)

## 2017-01-14 LAB — FOLATE: FOLATE: 23.9 ng/mL (ref >5.9)

## 2017-01-14 LAB — IBC PANEL
Iron: 36 ug/dL — ABNORMAL LOW (ref 42–165)
Saturation Ratios: 8.1 % — ABNORMAL LOW (ref 20.0–50.0)
Transferrin: 318 mg/dL (ref 212.0–360.0)

## 2017-01-14 LAB — VITAMIN B12: Vitamin B-12: 434 pg/mL (ref 211–911)

## 2017-01-14 LAB — FERRITIN: FERRITIN: 8.2 ng/mL — AB (ref 22.0–322.0)

## 2017-01-21 ENCOUNTER — Encounter: Payer: Self-pay | Admitting: Pulmonary Disease

## 2017-01-21 NOTE — Telephone Encounter (Signed)
Dr. Lenna Gilford, I reviewed the patients chart and the occult cards have not been read yet.   ----- Message -----  From: Iver Nestle  Sent: 6/26/20181:25 PM EDT  To: [name], MD Subject: Visit Follow-Up Question  Dr. Lenna Gilford; I delivered my Hemoccult kit to the basement lab on Friday and expected to hear from your office if there was any need for further discussion. Have you seen the results yet? My DVT scan is tomorrow afternoon so I guess I will be getting the results back from you ? or can the technician share that with me following the scan? Norina Buzzard

## 2017-01-22 ENCOUNTER — Ambulatory Visit (HOSPITAL_COMMUNITY)
Admission: RE | Admit: 2017-01-22 | Discharge: 2017-01-22 | Disposition: A | Discharge status: Home / Self Care | Payer: Medicare HMO | Source: Ambulatory Visit | Attending: Internal Medicine | Admitting: Internal Medicine

## 2017-01-22 ENCOUNTER — Other Ambulatory Visit (INDEPENDENT_AMBULATORY_CARE_PROVIDER_SITE_OTHER): Payer: Medicare HMO

## 2017-01-22 DIAGNOSIS — I82409 Acute embolism and thrombosis of unspecified deep veins of unspecified lower extremity: Secondary | ICD-10-CM | POA: Diagnosis not present

## 2017-01-22 DIAGNOSIS — M2041 Other hammer toe(s) (acquired), right foot: Secondary | ICD-10-CM | POA: Diagnosis not present

## 2017-01-22 DIAGNOSIS — I82499 Acute embolism and thrombosis of other specified deep vein of unspecified lower extremity: Secondary | ICD-10-CM | POA: Diagnosis not present

## 2017-01-22 DIAGNOSIS — D649 Anemia, unspecified: Secondary | ICD-10-CM | POA: Diagnosis not present

## 2017-01-22 DIAGNOSIS — M2042 Other hammer toe(s) (acquired), left foot: Secondary | ICD-10-CM | POA: Diagnosis not present

## 2017-01-22 DIAGNOSIS — L97519 Non-pressure chronic ulcer of other part of right foot with unspecified severity: Secondary | ICD-10-CM | POA: Diagnosis not present

## 2017-01-22 LAB — FECAL OCCULT BLOOD, IMMUNOCHEMICAL: Fecal Occult Bld: POSITIVE — AB

## 2017-01-23 ENCOUNTER — Encounter: Payer: Self-pay | Admitting: Pulmonary Disease

## 2017-01-23 DIAGNOSIS — D649 Anemia, unspecified: Secondary | ICD-10-CM

## 2017-01-23 DIAGNOSIS — Z7901 Long term (current) use of anticoagulants: Secondary | ICD-10-CM

## 2017-01-24 ENCOUNTER — Telehealth: Payer: Self-pay | Admitting: Internal Medicine

## 2017-01-24 ENCOUNTER — Ambulatory Visit (INDEPENDENT_AMBULATORY_CARE_PROVIDER_SITE_OTHER): Payer: Medicare HMO | Admitting: *Deleted

## 2017-01-24 ENCOUNTER — Encounter: Payer: Self-pay | Admitting: Pulmonary Disease

## 2017-01-24 DIAGNOSIS — I495 Sick sinus syndrome: Secondary | ICD-10-CM

## 2017-01-24 MED ORDER — RIVAROXABAN 20 MG PO TABS: 20.0000 mg | ORAL_TABLET | Freq: Every day | ORAL | 1 refills | Status: DC

## 2017-01-24 MED ORDER — AMLODIPINE BESYLATE 5 MG PO TABS: 5.0000 mg | ORAL_TABLET | Freq: Every day | ORAL | 3 refills | Status: DC

## 2017-01-24 MED ORDER — LOSARTAN POTASSIUM 100 MG PO TABS: ORAL_TABLET | ORAL | 3 refills | Status: DC

## 2017-01-24 MED ORDER — NAPROXEN 500 MG PO TABS: ORAL_TABLET | ORAL | 3 refills | Status: DC

## 2017-01-24 MED ORDER — TRAMADOL HCL 50 MG PO TABS: 50.0000 mg | ORAL_TABLET | Freq: Three times a day (TID) | ORAL | 1 refills | Status: DC

## 2017-01-24 MED ORDER — RIVAROXABAN 15 MG PO TABS: 15.0000 mg | ORAL_TABLET | Freq: Every day | ORAL | 3 refills | Status: DC

## 2017-01-24 MED ORDER — FERROUS SULFATE 325 (65 FE) MG PO TABS: 325.0000 mg | ORAL_TABLET | Freq: Every day | ORAL | 3 refills | Status: DC

## 2017-01-24 NOTE — Telephone Encounter (Signed)
We had previously discussed discontinuation of screening/surveillance for Barrett's and polyps, however IDA changes this recommendation Would bring patient in for an office visit to discuss EGD and colonoscopy in the setting of Xarelto which we would need permission to hold This can be with me or APP, next available

## 2017-01-24 NOTE — Telephone Encounter (Signed)
I called pt 7 discussed his concerns... SMN

## 2017-01-24 NOTE — Progress Notes (Signed)
Remote pacemaker transmission.   

## 2017-01-24 NOTE — Telephone Encounter (Signed)
Pt states he has become anemic and his stool tested positive for blood. Pt has internal hemorrhoids and was interested in having banding done but is still on xarelto. Pt thinks he needs to schedule a colonoscopy. Referral in epic from Dr. Lenna Gilford.  Please advise.

## 2017-01-24 NOTE — Telephone Encounter (Signed)
SN please advise. Thanks.   Fecal occult blood drawn-01/22/17  Iver Nestle  to [name], MD  Dr. Lenna Gilford or Nurse Marliss Czar;   I am puzzled about one thing in our overall search for loss of blood in my body. Is it possible that there is some blood in my urine? I have not taken a urine sample for about a year. There is no visible blood in my urine but at times it is darker when I am active and likely less hydrated. Should I come into your lab for a urine sample before I get reviewed by Dr. Hilarie Fredrickson (colonoscopy or endoscopy?)  Thanks for the prescription orders placed and adjusted plan to take the prescriptions. We'll soon find the culprit and get rid of it hopefully.   Tony Cruz

## 2017-01-28 ENCOUNTER — Encounter: Payer: Self-pay | Admitting: Cardiology

## 2017-01-30 ENCOUNTER — Encounter (HOSPITAL_COMMUNITY): Payer: Medicare HMO

## 2017-01-30 ENCOUNTER — Other Ambulatory Visit: Payer: Self-pay | Admitting: Pulmonary Disease

## 2017-01-30 ENCOUNTER — Telehealth: Payer: Self-pay | Admitting: Pulmonary Disease

## 2017-01-30 DIAGNOSIS — I739 Peripheral vascular disease, unspecified: Secondary | ICD-10-CM

## 2017-01-30 NOTE — Telephone Encounter (Signed)
[facility] called to advise [place] that they have 2 rx for the xarelto on hand.  15 mg and 20 mg.  They are aware that the pt is currently on xarelto 15 mg  Once daily.  They will get this filled for the pt.

## 2017-01-30 NOTE — Telephone Encounter (Signed)
Pt scheduled to see Dr. Hilarie Fredrickson 02/21/17@3pm . Left message for pt to call back.  Spoke with pt and he is aware of appt.

## 2017-02-05 ENCOUNTER — Other Ambulatory Visit: Payer: Self-pay | Admitting: Pulmonary Disease

## 2017-02-05 DIAGNOSIS — S81802A Unspecified open wound, left lower leg, initial encounter: Secondary | ICD-10-CM

## 2017-02-10 LAB — CUP PACEART REMOTE DEVICE CHECK
Battery Remaining Longevity: 3 mo
Brady Statistic AP VP Percent: 0 %
Brady Statistic AS VP Percent: 3 %
Brady Statistic AS VS Percent: 97 %
Date Time Interrogation Session: 20180628170931
Implantable Lead Model: 5092
Lead Channel Impedance Value: 724 Ohm
Lead Channel Impedance Value: 751 Ohm
Lead Channel Pacing Threshold Amplitude: 0.625 V
Lead Channel Pacing Threshold Amplitude: 0.875 V
Lead Channel Setting Pacing Amplitude: 2 V
Lead Channel Setting Sensing Sensitivity: 2 mV
MDC IDC LEAD IMPLANT DT: 19990330
MDC IDC LEAD IMPLANT DT: 19990330
MDC IDC LEAD LOCATION: 753859
MDC IDC LEAD LOCATION: 753860
MDC IDC MSMT BATTERY IMPEDANCE: 5948 Ohm
MDC IDC MSMT BATTERY VOLTAGE: 2.61 V
MDC IDC MSMT LEADCHNL RA PACING THRESHOLD PULSEWIDTH: 0.4 ms
MDC IDC MSMT LEADCHNL RV PACING THRESHOLD PULSEWIDTH: 0.4 ms
MDC IDC PG IMPLANT DT: 20081121
MDC IDC SET LEADCHNL RV PACING AMPLITUDE: 2.5 V
MDC IDC SET LEADCHNL RV PACING PULSEWIDTH: 0.4 ms
MDC IDC STAT BRADY AP VS PERCENT: 0 %

## 2017-02-11 ENCOUNTER — Ambulatory Visit (HOSPITAL_COMMUNITY)
Admission: RE | Admit: 2017-02-11 | Discharge: 2017-02-11 | Disposition: A | Discharge status: Home / Self Care | Payer: Medicare HMO | Source: Ambulatory Visit | Attending: Cardiovascular Disease | Admitting: Cardiovascular Disease

## 2017-02-11 DIAGNOSIS — I739 Peripheral vascular disease, unspecified: Secondary | ICD-10-CM

## 2017-02-11 DIAGNOSIS — I10 Essential (primary) hypertension: Secondary | ICD-10-CM | POA: Insufficient documentation

## 2017-02-11 DIAGNOSIS — S81802A Unspecified open wound, left lower leg, initial encounter: Secondary | ICD-10-CM

## 2017-02-11 IMAGING — CT CT ANGIO CHEST
2 of 8 series · 18 of 46 positions shown · IV contrast (isovue)
Comparison: None.

CLINICAL DATA: Abnormal ultrasound.  Rule out aortic dilatation.

EXAM:
CT ANGIOGRAPHY CHEST WITH CONTRAST
TECHNIQUE: Multidetector CT imaging of the chest was performed using the
standard protocol during bolus administration of intravenous
contrast. Multiplanar CT image reconstructions and MIPs were
obtained to evaluate the vascular anatomy.
CONTRAST:  100 cc Isovue 370 intravenous

[Series 5: thins · axial · 0.76mm/px · z∈[+1193,+1445]mm · 15 of 282 slices shown]
[im 15/282  lung]
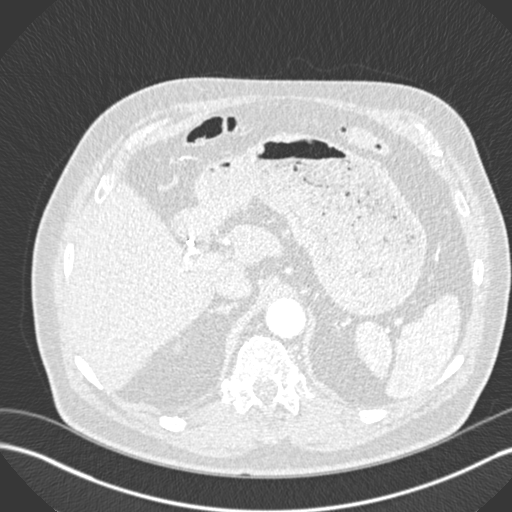
[im 30/282  soft-tissue]
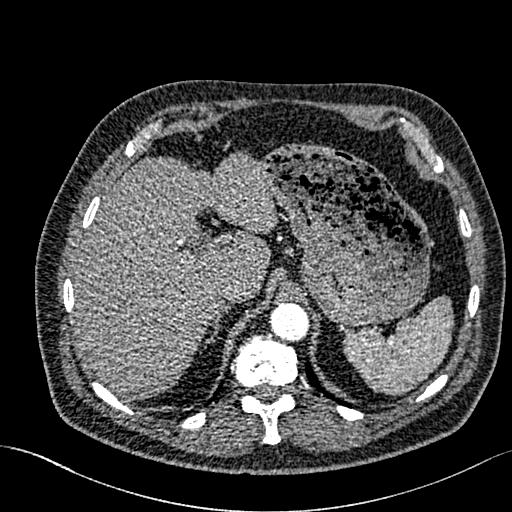
[im 60/282  lung]
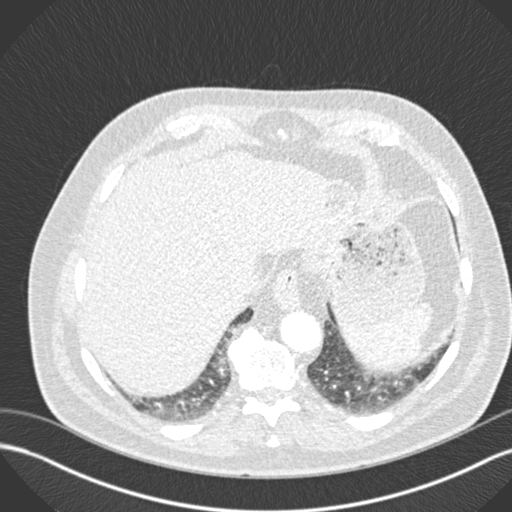
[im 74/282  soft-tissue]
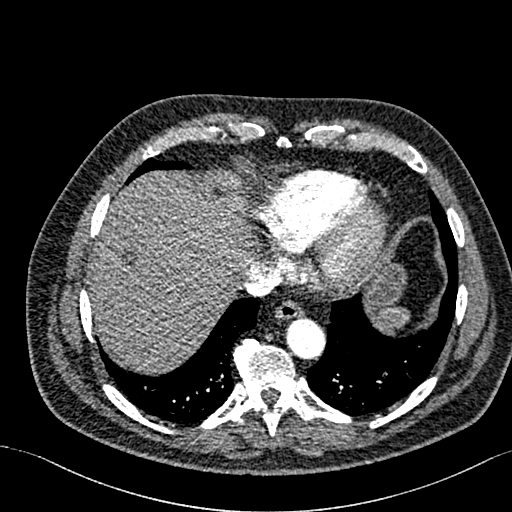
[im 89/282  lung]
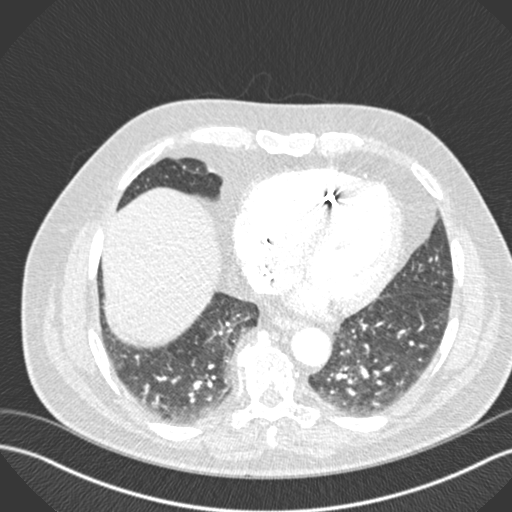
[im 104/282  soft-tissue]
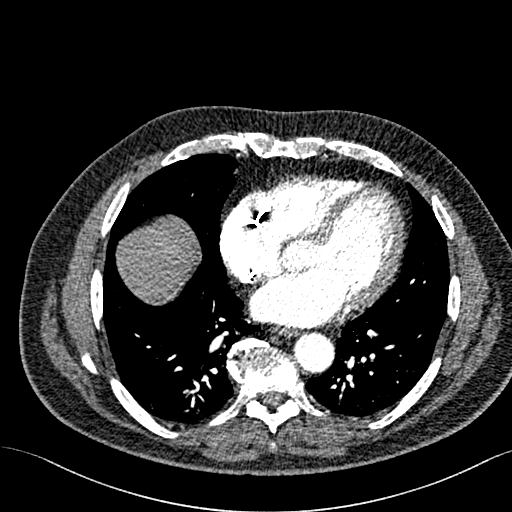
[im 119/282  lung]
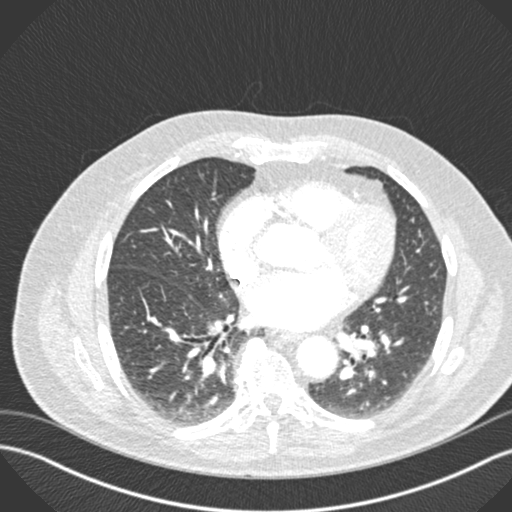
[im 148/282  soft-tissue]
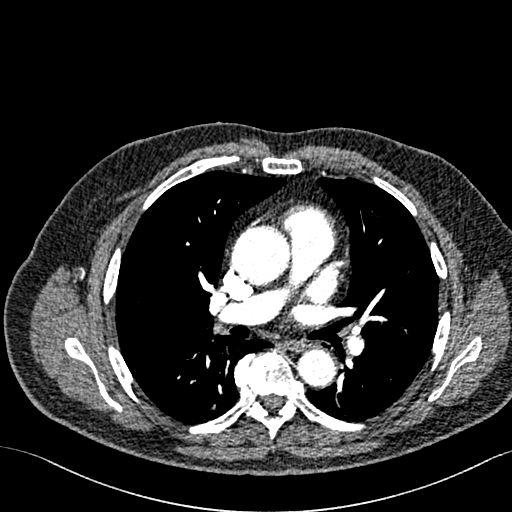
[im 163/282  lung]
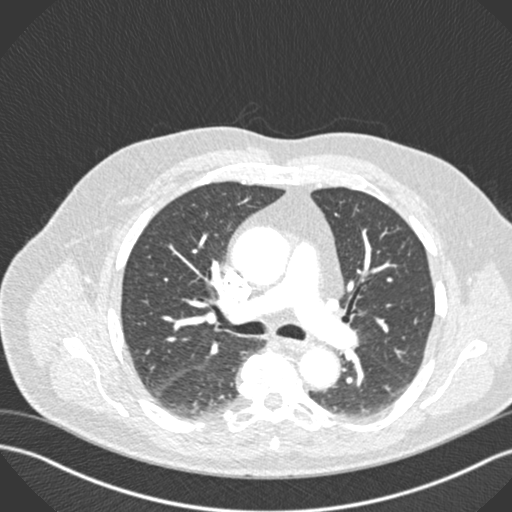
[im 178/282  soft-tissue]
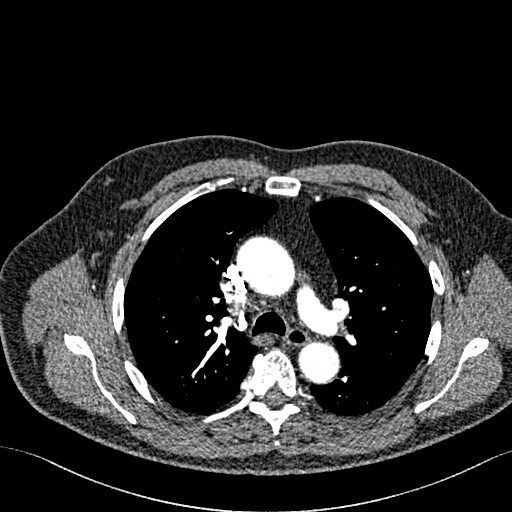
[im 193/282  lung]
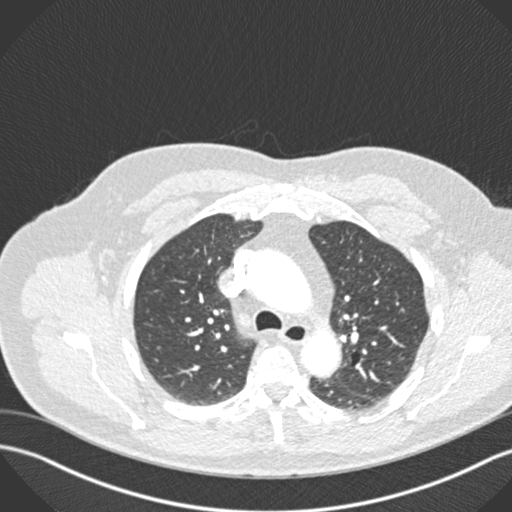
[im 208/282  soft-tissue]
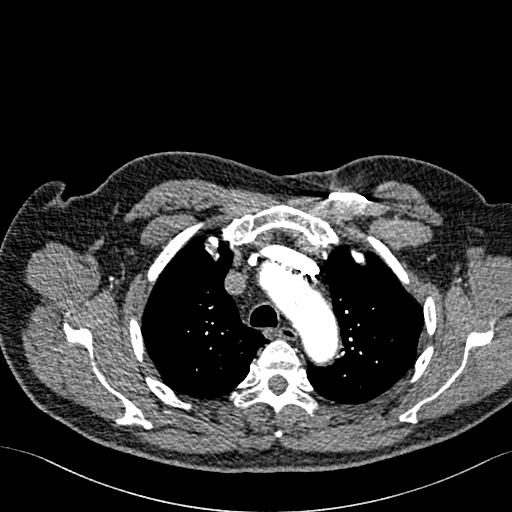
[im 237/282  lung]
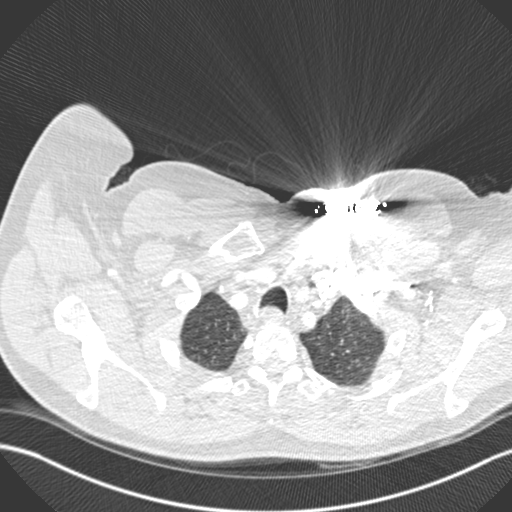
[im 252/282  soft-tissue]
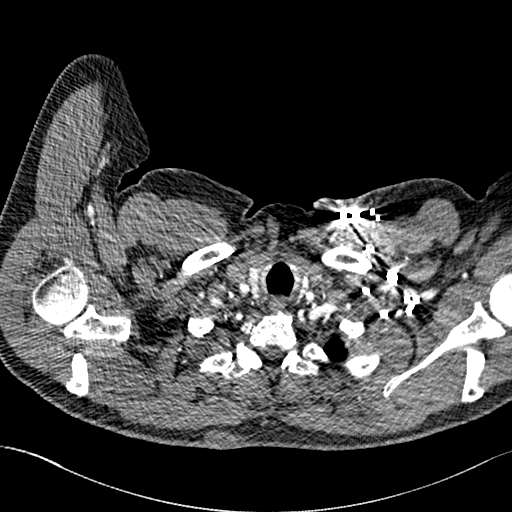
[im 267/282  lung]
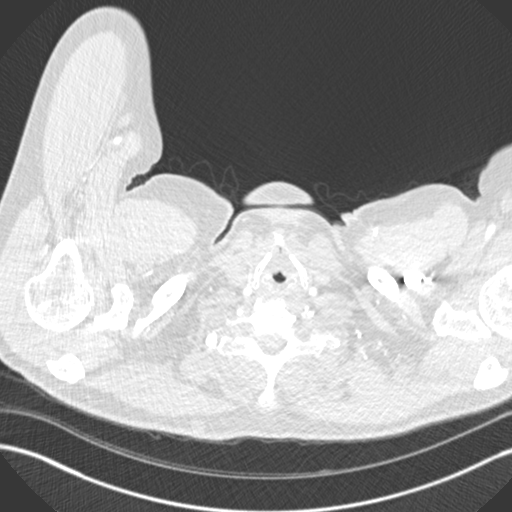

[Series 7: coronal mpr · coronal · 0.59mm/px · 3 of 148 slices shown]
[im 37/148  soft-tissue]
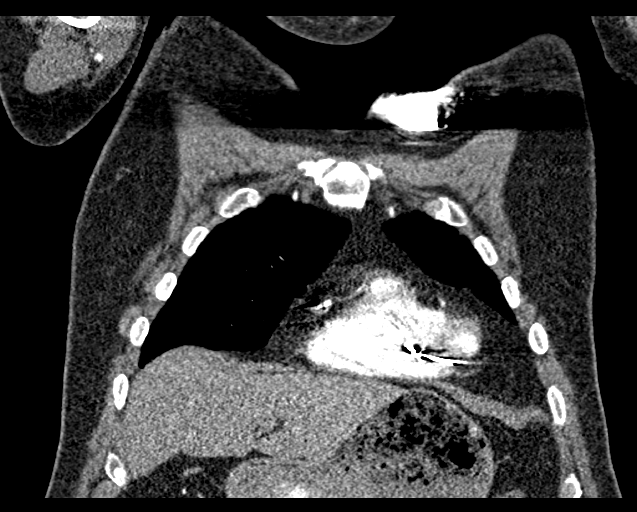
[im 74/148  soft-tissue]
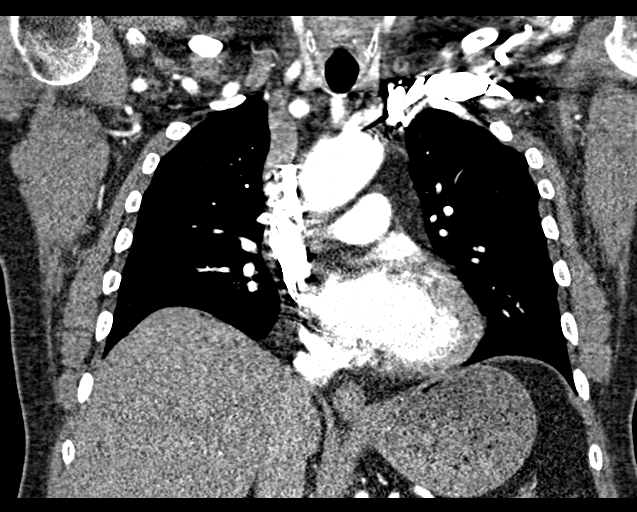
[im 111/148  soft-tissue]
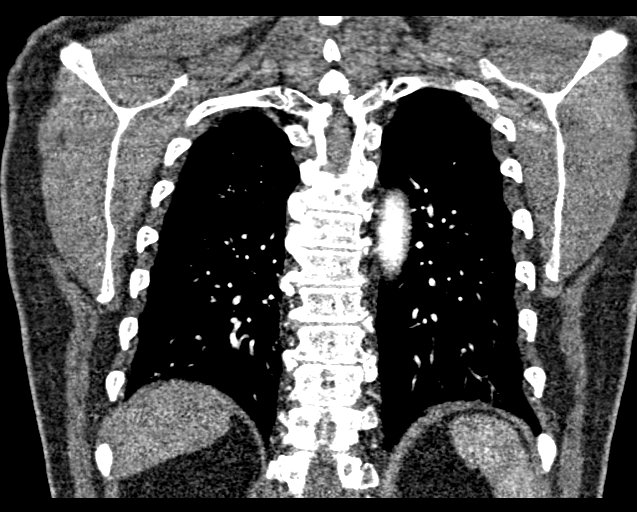

[18 of 46 positions shown; findings below may reference images not displayed]

FINDINGS: Cardiovascular:  Normal heart size.  No pericardial effusion.

Aortic diameters:

Aortic valve: 30 mm.

Retama junction:  28 mm.

Ascending segment up to 42 mm.

Aortic arch:  30 mm.

Descending segment: 29 mm

Mediastinum/Nodes: Negative esophagus.  No adenopathy.

Lungs/Pleura: Lungs are clear. No pleural effusion or pneumothorax.

Upper Abdomen: Small right hepatic cyst.

Musculoskeletal: Spondylosis.  No acute or aggressive finding.

Review of the MIP images confirms the above findings.
IMPRESSION: Aneurysmal ascending aorta measuring up to 42 mm diameter. Recommend
annual imaging followup by CTA or MRA. This recommendation follows
2232 ACCF/AHA/AATS/ACR/ASA/SCA/AUJLA/EDER/DON LOLITO/JOSDIEL YOMAR Guidelines for the
Diagnosis and Management of Patients with Thoracic Aortic Disease.
Circulation. 2232; 121: [id number]

## 2017-02-17 DIAGNOSIS — R69 Illness, unspecified: Secondary | ICD-10-CM | POA: Diagnosis not present

## 2017-02-21 ENCOUNTER — Encounter: Payer: Self-pay | Admitting: Internal Medicine

## 2017-02-21 ENCOUNTER — Other Ambulatory Visit (INDEPENDENT_AMBULATORY_CARE_PROVIDER_SITE_OTHER): Payer: Medicare HMO

## 2017-02-21 ENCOUNTER — Ambulatory Visit (INDEPENDENT_AMBULATORY_CARE_PROVIDER_SITE_OTHER): Payer: Medicare HMO | Admitting: Internal Medicine

## 2017-02-21 VITALS — BP 120/74 | HR 76 | Ht 67.5 in | Wt 212.2 lb

## 2017-02-21 DIAGNOSIS — D509 Iron deficiency anemia, unspecified: Secondary | ICD-10-CM

## 2017-02-21 DIAGNOSIS — R195 Other fecal abnormalities: Secondary | ICD-10-CM

## 2017-02-21 DIAGNOSIS — Z8719 Personal history of other diseases of the digestive system: Secondary | ICD-10-CM | POA: Diagnosis not present

## 2017-02-21 LAB — CBC WITH DIFFERENTIAL/PLATELET
BASOS PCT: 1 % (ref 0.0–3.0)
Basophils Absolute: 0.1 10*3/uL (ref 0.0–0.1)
EOS ABS: 0.5 10*3/uL (ref 0.0–0.7)
Eosinophils Relative: 5.8 % — ABNORMAL HIGH (ref 0.0–5.0)
HCT: 37.4 % — ABNORMAL LOW (ref 39.0–52.0)
HEMOGLOBIN: 12.1 g/dL — AB (ref 13.0–17.0)
Lymphocytes Relative: 27.8 % (ref 12.0–46.0)
Lymphs Abs: 2.4 10*3/uL (ref 0.7–4.0)
MCHC: 32.4 g/dL (ref 30.0–36.0)
MCV: 89 fl (ref 78.0–100.0)
MONO ABS: 1.1 10*3/uL — AB (ref 0.1–1.0)
Monocytes Relative: 12.5 % — ABNORMAL HIGH (ref 3.0–12.0)
NEUTROS ABS: 4.7 10*3/uL (ref 1.4–7.7)
Neutrophils Relative %: 52.9 % (ref 43.0–77.0)
PLATELETS: 291 10*3/uL (ref 150.0–400.0)
RBC: 4.2 Mil/uL — ABNORMAL LOW (ref 4.22–5.81)
RDW: 21.2 % — AB (ref 11.5–15.5)
WBC: 8.8 10*3/uL (ref 4.0–10.5)

## 2017-02-21 MED ORDER — SUPREP BOWEL PREP KIT 17.5-3.13-1.6 GM/177ML PO SOLN: 1.0000 | ORAL | 0 refills | Status: DC

## 2017-02-21 NOTE — Progress Notes (Signed)
Subjective:    Patient ID: Tony Cruz, male    DOB: [birth year], 83 y.o.   MRN: [id number]  HPI Tony Cruz is an 83 year old male with a past medical history of GERD with Barrett's esophagus, internal hemorrhoids, history of anal fissure who is seen in consultation at the request of Dr. Lenna Gilford due to a new iron Deficiency anemia. I last saw him in February 2018 at that time he had recently been started on Xarelto for a DVT diagnosed in December 2017. He also has history of hypertension and obstructive sleep apnea. He has a pacemaker in place.  He reports that he continues to feel well but he was found to have an iron deficiency anemia and heme positive stool. He notes that his stools have been dark but mostly since starting oral iron. He's having 2-3 bowel movements per day without diarrhea or constipation. He has not seen any overt bleeding nor melena. He denies abdominal pain. He denies nausea, vomiting, dysphagia and odynophagia. Weight has been stable and appetite has been good. He has lost a little bit of stamina but has continued to be active and played golf this morning. He was using some Aleve for lower back pain but he has since stopped this.  His Xarelto dose has been lowered from 20 mg to 15 mg daily but the decision was made to continue therapy given persistence in deep vein thrombosis seen on follow-up ultrasound. He has been on pantoprazole 40 mg once daily.  His last upper endoscopy and colonoscopy were in 2014. EGD showed Barrett's esophagus without dysplasia. Colonoscopy one polyp was removed from the cecum which was 5 mm. He had diverticulosis in the ascending and descending, sigmoid colon. Internal hemorrhoids were found.  Review of Systems As per history of present illness, otherwise negative  Current Medications, Allergies, Past Medical History, Past Surgical History, Family History and Social History were reviewed in [employer] record.       Objective:   Physical Exam BP 120/74 (BP Location: Left Arm, Patient Position: Sitting, Cuff Size: Normal)   Pulse 76   Ht 5' 7.5" (1.715 m)   Wt 212 lb 3.2 oz (96.3 kg)   SpO2 96%   BMI 32.74 kg/m  Constitutional: Well-developed and well-nourished. No distress. HEENT: Normocephalic and atraumatic.  Conjunctivae are normal.  No scleral icterus. Neck: Neck supple. Trachea midline. Cardiovascular: Normal rate, regular rhythm and intact distal pulses.  Pulmonary/chest: Effort normal and breath sounds normal. No wheezing, rales or rhonchi. Abdominal: Soft, nontender, nondistended. Bowel sounds active throughout. There are no masses palpable.  Extremities: no clubbing, cyanosis, or edema Neurological: Alert and oriented to person place and time. Skin: Skin is warm and dry. Psychiatric: Normal mood and affect. Behavior is normal.  CBC    Component Value Date/Time   WBC 8.8 02/21/2017 1533   RBC 4.20 (L) 02/21/2017 1533   HGB 12.1 (L) 02/21/2017 1533   HCT 37.4 (L) 02/21/2017 1533   PLT 291.0 02/21/2017 1533   MCV 89.0 02/21/2017 1533   MCV 83.0 01/10/2017 1632   MCH 28.5 01/10/2017 1632   MCH 31.3 07/26/2016 1535   MCHC 32.4 02/21/2017 1533   RDW 21.2 (H) 02/21/2017 1533   LYMPHSABS 2.4 02/21/2017 1533   MONOABS 1.1 (H) 02/21/2017 1533   EOSABS 0.5 02/21/2017 1533   BASOSABS 0.1 02/21/2017 1533   CMP     Component Value Date/Time   NA 135 01/14/2017 0823   K 4.1 01/14/2017 8588  CL 102 01/14/2017 0823   CO2 26 01/14/2017 0823   GLUCOSE 119 (H) 01/14/2017 0823   BUN 16 01/14/2017 0823   CREATININE 0.89 01/14/2017 0823   CREATININE 0.93 05/20/2016 1041   CALCIUM 8.9 01/14/2017 0823   PROT 6.3 01/14/2017 0823   ALBUMIN 3.8 01/14/2017 0823   AST 16 01/14/2017 0823   ALT 17 01/14/2017 0823   ALKPHOS 53 01/14/2017 0823   BILITOT 0.5 01/14/2017 0823   GFRNONAA >60 07/26/2016 1535   GFRAA >60 07/26/2016 1535   Iron/TIBC/Ferritin/ %Sat    Component Value Date/Time    IRON 36 (L) 01/14/2017 0823   FERRITIN 8.2 (L) 01/14/2017 0823   IRONPCTSAT 8.1 (L) 01/14/2017 0823       Assessment & Plan:  83 year old male with a past medical history of GERD with Barrett's esophagus, internal hemorrhoids, history of anal fissure who is seen in consultation at the request of Dr. Lenna Gilford due to a new iron Deficiency anemia  1. IDA/History of GERD with Barrett's esophagus/history of colon polyp -- he has not had any overt bleeding but does have a new iron deficiency anemia with heme positive stool. This is on the setting of Xarelto therapy. After a previous office visit we had decided to defer further screening/surveillance endoscopies however in light of iron deficiency anemia and heme positive stool I recommended upper endoscopy and colonoscopy. We discussed the risks, benefits and alternatives and he is agreeable and wishes to proceed. Repeat CBC today. Will hold Xarelto to days prior to endoscopic procedures - will instruct when and how to resume after procedure. Benefits and risks of procedure explained including risks of bleeding, perforation, infection, missed lesions, reactions to medications and possible need for hospitalization and surgery for complications. Additional rare but real risk of stroke or other vascular clotting events off Xarelto also explained and need to seek urgent help if any signs of these problems occur. Will communicate by phone or EMR with patient's  prescribing provider to confirm that holding Xarelto is reasonable in this case.

## 2017-02-21 NOTE — Patient Instructions (Signed)
Your physician has requested that you go to the basement for the following lab work before leaving today: Tony Cruz have been scheduled for an endoscopy and colonoscopy. Please follow the written instructions given to you at your visit today. Please pick up your prep supplies at the pharmacy within the next 1-3 days. If you use inhalers (even only as needed), please bring them with you on the day of your procedure. Your physician has requested that you go to [email or web address] and enter the access code given to you at your visit today. This web site gives a general overview about your procedure. However, you should still follow specific instructions given to you by our office regarding your preparation for the procedure.  You will be contacted by our office prior to your procedure for directions on holding your xarelto  If you do not hear from our office 1 week prior to your scheduled procedure, please call [phone] to discuss.   If you are age 46 or older, your body mass index should be between 23-30. Your Body mass index is 32.74 kg/m. If this is out of the aforementioned range listed, please consider follow up with your Primary Care Provider.  If you are age 62 or younger, your body mass index should be between 19-25. Your Body mass index is 32.74 kg/m. If this is out of the aformentioned range listed, please consider follow up with your Primary Care Provider.

## 2017-02-24 ENCOUNTER — Encounter: Payer: Medicare HMO | Admitting: *Deleted

## 2017-02-24 ENCOUNTER — Telehealth: Payer: Self-pay | Admitting: Cardiology

## 2017-02-24 ENCOUNTER — Encounter: Payer: Self-pay | Admitting: Internal Medicine

## 2017-02-24 NOTE — Telephone Encounter (Signed)
Spoke with pt and reminded pt of remote transmission that is due today. Pt verbalized understanding.   

## 2017-02-25 DIAGNOSIS — L97519 Non-pressure chronic ulcer of other part of right foot with unspecified severity: Secondary | ICD-10-CM | POA: Diagnosis not present

## 2017-02-25 DIAGNOSIS — M2041 Other hammer toe(s) (acquired), right foot: Secondary | ICD-10-CM | POA: Diagnosis not present

## 2017-02-25 DIAGNOSIS — M2042 Other hammer toe(s) (acquired), left foot: Secondary | ICD-10-CM | POA: Diagnosis not present

## 2017-02-28 ENCOUNTER — Encounter: Payer: Self-pay | Admitting: Internal Medicine

## 2017-02-28 ENCOUNTER — Encounter: Payer: Self-pay | Admitting: Cardiology

## 2017-03-12 ENCOUNTER — Ambulatory Visit (INDEPENDENT_AMBULATORY_CARE_PROVIDER_SITE_OTHER): Payer: Self-pay | Admitting: *Deleted

## 2017-03-12 DIAGNOSIS — I495 Sick sinus syndrome: Secondary | ICD-10-CM

## 2017-03-13 ENCOUNTER — Encounter: Payer: Self-pay | Admitting: Internal Medicine

## 2017-03-13 LAB — CUP PACEART REMOTE DEVICE CHECK
Battery Remaining Longevity: 1 mo — CL
Brady Statistic AS VP Percent: 3 %
Date Time Interrogation Session: 20180815222037
Implantable Lead Implant Date: 19990330
Lead Channel Pacing Threshold Amplitude: 0.625 V
Lead Channel Pacing Threshold Amplitude: 0.875 V
Lead Channel Setting Pacing Amplitude: 2 V
Lead Channel Setting Sensing Sensitivity: 2 mV
MDC IDC LEAD IMPLANT DT: 19990330
MDC IDC LEAD LOCATION: 753859
MDC IDC LEAD LOCATION: 753860
MDC IDC MSMT BATTERY IMPEDANCE: 6599 Ohm
MDC IDC MSMT BATTERY VOLTAGE: 2.6 V
MDC IDC MSMT LEADCHNL RA IMPEDANCE VALUE: 778 Ohm
MDC IDC MSMT LEADCHNL RA PACING THRESHOLD PULSEWIDTH: 0.4 ms
MDC IDC MSMT LEADCHNL RV IMPEDANCE VALUE: 699 Ohm
MDC IDC MSMT LEADCHNL RV PACING THRESHOLD PULSEWIDTH: 0.4 ms
MDC IDC PG IMPLANT DT: 20081121
MDC IDC SET LEADCHNL RV PACING AMPLITUDE: 2.5 V
MDC IDC SET LEADCHNL RV PACING PULSEWIDTH: 0.4 ms
MDC IDC STAT BRADY AP VP PERCENT: 0 %
MDC IDC STAT BRADY AP VS PERCENT: 0 %
MDC IDC STAT BRADY AS VS PERCENT: 97 %

## 2017-03-13 NOTE — Progress Notes (Signed)
Remote pacemaker check. 

## 2017-03-25 ENCOUNTER — Encounter: Payer: Self-pay | Admitting: Cardiology

## 2017-03-26 ENCOUNTER — Encounter: Payer: Self-pay | Admitting: Internal Medicine

## 2017-03-28 DIAGNOSIS — G4733 Obstructive sleep apnea (adult) (pediatric): Secondary | ICD-10-CM | POA: Diagnosis not present

## 2017-04-09 ENCOUNTER — Encounter: Payer: Self-pay | Admitting: Internal Medicine

## 2017-04-09 ENCOUNTER — Other Ambulatory Visit (INDEPENDENT_AMBULATORY_CARE_PROVIDER_SITE_OTHER): Payer: Medicare HMO

## 2017-04-09 ENCOUNTER — Other Ambulatory Visit: Payer: Self-pay

## 2017-04-09 ENCOUNTER — Ambulatory Visit (AMBULATORY_SURGERY_CENTER): Payer: Medicare HMO | Admitting: Internal Medicine

## 2017-04-09 VITALS — BP 110/71 | HR 72 | Temp 98.9°F | Resp 15 | Ht 67.5 in | Wt 212.0 lb

## 2017-04-09 DIAGNOSIS — K2270 Barrett's esophagus without dysplasia: Secondary | ICD-10-CM | POA: Diagnosis not present

## 2017-04-09 DIAGNOSIS — K5190 Ulcerative colitis, unspecified, without complications: Secondary | ICD-10-CM | POA: Diagnosis not present

## 2017-04-09 DIAGNOSIS — D123 Benign neoplasm of transverse colon: Secondary | ICD-10-CM

## 2017-04-09 DIAGNOSIS — D125 Benign neoplasm of sigmoid colon: Secondary | ICD-10-CM

## 2017-04-09 DIAGNOSIS — K219 Gastro-esophageal reflux disease without esophagitis: Secondary | ICD-10-CM | POA: Diagnosis not present

## 2017-04-09 DIAGNOSIS — D120 Benign neoplasm of cecum: Secondary | ICD-10-CM | POA: Diagnosis not present

## 2017-04-09 DIAGNOSIS — D509 Iron deficiency anemia, unspecified: Secondary | ICD-10-CM

## 2017-04-09 DIAGNOSIS — G4733 Obstructive sleep apnea (adult) (pediatric): Secondary | ICD-10-CM | POA: Diagnosis not present

## 2017-04-09 DIAGNOSIS — K635 Polyp of colon: Secondary | ICD-10-CM | POA: Diagnosis not present

## 2017-04-09 DIAGNOSIS — R195 Other fecal abnormalities: Secondary | ICD-10-CM

## 2017-04-09 DIAGNOSIS — I10 Essential (primary) hypertension: Secondary | ICD-10-CM | POA: Diagnosis not present

## 2017-04-09 LAB — IBC PANEL
IRON: 78 ug/dL (ref 42–165)
Saturation Ratios: 20 % (ref 20.0–50.0)
Transferrin: 279 mg/dL (ref 212.0–360.0)

## 2017-04-09 LAB — CBC WITH DIFFERENTIAL/PLATELET
Basophils Absolute: 0.1 10*3/uL (ref 0.0–0.1)
Basophils Relative: 0.9 % (ref 0.0–3.0)
EOS ABS: 0.4 10*3/uL (ref 0.0–0.7)
EOS PCT: 6.3 % — AB (ref 0.0–5.0)
HCT: 42 % (ref 39.0–52.0)
HEMOGLOBIN: 13.8 g/dL (ref 13.0–17.0)
LYMPHS ABS: 2.5 10*3/uL (ref 0.7–4.0)
Lymphocytes Relative: 36.6 % (ref 12.0–46.0)
MCHC: 32.9 g/dL (ref 30.0–36.0)
MCV: 93.6 fl (ref 78.0–100.0)
MONO ABS: 0.6 10*3/uL (ref 0.1–1.0)
Monocytes Relative: 9.3 % (ref 3.0–12.0)
NEUTROS PCT: 46.9 % (ref 43.0–77.0)
Neutro Abs: 3.3 10*3/uL (ref 1.4–7.7)
Platelets: 229 10*3/uL (ref 150.0–400.0)
RBC: 4.49 Mil/uL (ref 4.22–5.81)
RDW: 20.6 % — AB (ref 11.5–15.5)
WBC: 6.9 10*3/uL (ref 4.0–10.5)

## 2017-04-09 LAB — FERRITIN: FERRITIN: 41.1 ng/mL (ref 22.0–322.0)

## 2017-04-09 MED ORDER — SODIUM CHLORIDE 0.9 % IV SOLN: 500.0000 mL | INTRAVENOUS | Status: DC

## 2017-04-09 NOTE — Op Note (Signed)
[facility] Patient Name: Tony Cruz Procedure Date: 04/09/2017 2:57 PM MRN: [id number] Endoscopist: Jerene Bears , MD Age: 83 Referring MD:  Date of Birth: [birth year] Gender: Male Account #: [id number] Procedure:                Colonoscopy Indications:              Heme positive stool, Iron deficiency anemia,                            Personal history of colonic polyps Medicines:                Monitored Anesthesia Care Procedure:                Pre-Anesthesia Assessment:                           - Prior to the procedure, a History and Physical                            was performed, and patient medications and                            allergies were reviewed. The patient's tolerance of                            previous anesthesia was also reviewed. The risks                            and benefits of the procedure and the sedation                            options and risks were discussed with the patient.                            All questions were answered, and informed consent                            was obtained. Prior Anticoagulants: The patient has                            taken Xarelto (rivaroxaban), last dose was 2 days                            prior to procedure. ASA Grade Assessment: III - A                            patient with severe systemic disease. After                            reviewing the risks and benefits, the patient was                            deemed in satisfactory condition to undergo the  procedure.                           After obtaining informed consent, the colonoscope                            was passed under direct vision. Throughout the                            procedure, the patient's blood pressure, pulse, and                            oxygen saturations were monitored continuously. The                            Colonoscope was introduced through the anus and           advanced to the the cecum, identified by                            appendiceal orifice and ileocecal valve. The                            colonoscopy was performed without difficulty. The                            patient tolerated the procedure well. The quality                            of the bowel preparation was good. The ileocecal                            valve, appendiceal orifice, and rectum were                            photographed. Scope In: 3:17:50 PM Scope Out: 3:36:26 PM Scope Withdrawal Time: 0 hours 15 minutes 7 seconds  Total Procedure Duration: 0 hours 18 minutes 36 seconds  Findings:                 The digital rectal exam was normal.                           Two sessile polyps were found in the cecum. The                            polyps were 4 to 5 mm in size. These polyps were                            removed with a cold snare. Resection and retrieval                            were complete.                           A 6 mm polyp was  found in the proximal transverse                            colon. The polyp was sessile. The polyp was removed                            with a cold snare. Resection and retrieval were                            complete.                           A 5 mm polyp was found in the sigmoid colon at a                            diverticulum. The polyp was sessile with polypoid                            tissue on either side of the diverticular orifice.                            Query inflammatory polyp. The polyp was removed                            with a cold snare to exclude adenoma. Resection and                            retrieval were complete.                           Multiple small and large-mouthed diverticula were                            found from ascending colon to sigmoid colon.                           Internal hemorrhoids were found during retroflexion                            and during  endoscopy. The hemorrhoids were                            medium-sized. Complications:            No immediate complications. Estimated Blood Loss:     Estimated blood loss was minimal. Impression:               - Two 4 to 5 mm polyps in the cecum, removed with a                            cold snare. Resected and retrieved.                           - One 6 mm polyp in the proximal transverse colon,  removed with a cold snare. Resected and retrieved.                           - One 5 mm polyp in the sigmoid colon at                            diverticulum, removed with a cold snare. Resected                            and retrieved.                           - Severe diverticulosis from ascending colon to                            sigmoid colon.                           - Internal hemorrhoids. Recommendation:           - Patient has a contact number available for                            emergencies. The signs and symptoms of potential                            delayed complications were discussed with the                            patient. Return to normal activities tomorrow.                            Written discharge instructions were provided to the                            patient.                           - Resume previous diet.                           - Continue present medications.                           - Resume Xarelto (rivaroxaban) at prior dose                            tomorrow. Refer to managing physician for further                            adjustment of therapy.                           - Await pathology results.                           - Repeat CBC, ferritin, and IBC panel. Resume  oral                            iron.                           - No recommendation at this time regarding repeat                            colonoscopy due to age of 84 years.                           - If persistent or progressive IDA  despite iron                            replacement, then video capsule endoscopy is                            recommended. However, if improving Hgb and iron                            counts, then can continue oral iron supplementation                            and observe. Jerene Bears, MD 04/09/2017 4:00:02 PM This report has been signed electronically.

## 2017-04-09 NOTE — Op Note (Signed)
[facility] Patient Name: Tony Cruz Procedure Date: 04/09/2017 2:57 PM MRN: [id number] Endoscopist: Jerene Bears , MD Age: 83 Referring MD:  Date of Birth: [birth year] Gender: Male Account #: [id number] Procedure:                Upper GI endoscopy Indications:              Iron deficiency anemia, heme + stool Medicines:                Monitored Anesthesia Care Procedure:                Pre-Anesthesia Assessment:                           - Prior to the procedure, a History and Physical                            was performed, and patient medications and                            allergies were reviewed. The patient's tolerance of                            previous anesthesia was also reviewed. The risks                            and benefits of the procedure and the sedation                            options and risks were discussed with the patient.                            All questions were answered, and informed consent                            was obtained. Prior Anticoagulants: The patient has                            taken Xarelto (rivaroxaban), last dose was 2 days                            prior to procedure. ASA Grade Assessment: III - A                            patient with severe systemic disease. After                            reviewing the risks and benefits, the patient was                            deemed in satisfactory condition to undergo the                            procedure.  After obtaining informed consent, the endoscope was                            passed under direct vision. Throughout the                            procedure, the patient's blood pressure, pulse, and                            oxygen saturations were monitored continuously. The                            Endoscope was introduced through the mouth, and                            advanced to the second part of duodenum. The upper                        GI endoscopy was accomplished without difficulty.                            The patient tolerated the procedure well. Scope In: Scope Out: Findings:                 The Z-line was slightly irregular. No evidence for                            significant Barrett's esophagus. Mucosa examined                            under white light and NBI.                           A small hiatal hernia was present.                           The esophagus was otherwise without abnormality.                           Multiple small sessile polyps with no bleeding and                            no stigmata of recent bleeding were found in the                            gastric fundus and in the gastric body. These are                            consistent with benign, fundic gland polyps.                           The exam of the stomach was otherwise normal.                           The examined duodenum was normal.  Complications:            No immediate complications. Estimated Blood Loss:     Estimated blood loss: none. Impression:               - Z-line slightly irregular without significant                            Barrett's esophagus.                           - Small hiatal hernia.                           - Multiple small, benign, fundic gland gastric                            polyps.                           - Normal examined duodenum.                           - No specimens collected. Recommendation:           - Patient has a contact number available for                            emergencies. The signs and symptoms of potential                            delayed complications were discussed with the                            patient. Return to normal activities tomorrow.                            Written discharge instructions were provided to the                            patient.                           - Resume previous diet.                           -  Continue present medications.                           - See the other procedure note for documentation of                            additional recommendations. Jerene Bears, MD 04/09/2017 3:53:29 PM This report has been signed electronically.

## 2017-04-09 NOTE — Progress Notes (Signed)
Called to room to assist during endoscopic procedure.  Patient ID and intended procedure confirmed with present staff. Received instructions for my participation in the procedure from the performing physician.  

## 2017-04-09 NOTE — Progress Notes (Signed)
Report to PACU, RN, vss, BBS= Clear.  

## 2017-04-09 NOTE — Patient Instructions (Signed)
**Handouts given on GERD, Hiatal Hernia, Diverticulosis, Hemorrhoids, and Polyps**   **Resume Xarelto at prior dose tomorrow. Refer to managing physician for further adjustment of therapy.**  Blood work to be done after discharge    YOU HAD AN ENDOSCOPIC PROCEDURE TODAY AT [facility]:   Refer to the procedure report that was given to you for any specific questions about what was found during the examination.  If the procedure report does not answer your questions, please call your gastroenterologist to clarify.  If you requested that your care partner not be given the details of your procedure findings, then the procedure report has been included in a sealed envelope for you to review at your convenience later.  YOU SHOULD EXPECT: Some feelings of bloating in the abdomen. Passage of more gas than usual.  Walking can help get rid of the air that was put into your GI tract during the procedure and reduce the bloating. If you had a lower endoscopy (such as a colonoscopy or flexible sigmoidoscopy) you may notice spotting of blood in your stool or on the toilet paper. If you underwent a bowel prep for your procedure, you may not have a normal bowel movement for a few days.  Please Note:  You might notice some irritation and congestion in your nose or some drainage.  This is from the oxygen used during your procedure.  There is no need for concern and it should clear up in a day or so.  SYMPTOMS TO REPORT IMMEDIATELY:   Following lower endoscopy (colonoscopy or flexible sigmoidoscopy):  Excessive amounts of blood in the stool  Significant tenderness or worsening of abdominal pains  Swelling of the abdomen that is new, acute  Fever of 100F or higher   Following upper endoscopy (EGD)  Vomiting of blood or coffee ground material  New chest pain or pain under the shoulder blades  Painful or persistently difficult swallowing  New shortness of breath  Fever of 100F or  higher  Black, tarry-looking stools  For urgent or emergent issues, a gastroenterologist can be reached at any hour by calling [phone].   DIET:  We do recommend a small meal at first, but then you may proceed to your regular diet.  Drink plenty of fluids but you should avoid alcoholic beverages for 24 hours.  ACTIVITY:  You should plan to take it easy for the rest of today and you should NOT DRIVE or use heavy machinery until tomorrow (because of the sedation medicines used during the test).    FOLLOW UP: Our staff will call the number listed on your records the next business day following your procedure to check on you and address any questions or concerns that you may have regarding the information given to you following your procedure. If we do not reach you, we will leave a message.  However, if you are feeling well and you are not experiencing any problems, there is no need to return our call.  We will assume that you have returned to your regular daily activities without incident.  If any biopsies were taken you will be contacted by phone or by letter within the next 1-3 weeks.  Please call us at [phone] if you have not heard about the biopsies in 3 weeks.    SIGNATURES/CONFIDENTIALITY: You and/or your care partner have signed paperwork which will be entered into your electronic medical record.  These signatures attest to the fact that that the information above on  your After Visit Summary has been reviewed and is understood.  Full responsibility of the confidentiality of this discharge information lies with you and/or your care-partner. 

## 2017-04-10 ENCOUNTER — Other Ambulatory Visit: Payer: Self-pay

## 2017-04-10 ENCOUNTER — Encounter: Payer: Self-pay | Admitting: Pulmonary Disease

## 2017-04-10 ENCOUNTER — Telehealth: Payer: Self-pay | Admitting: *Deleted

## 2017-04-10 DIAGNOSIS — D509 Iron deficiency anemia, unspecified: Secondary | ICD-10-CM

## 2017-04-10 NOTE — Telephone Encounter (Signed)
SN please advise on recs.  Thanks!   Dr. Lenna Gilford,  Your previous analysis of my anemia has been abated with the iron pill and vitamin C you prescribed. I reported to Dr. Hilarie Fredrickson yesterday for the search for blood in my system via endoscopy and colonoscopy. He found no significant source of blood other than a couple of possible internal hemorrhoids. He had me give another blood sample to the lab which came back today showing my hemoglobin has returned to the lower side of the standard range. Thanks to you both ! My question to you is whether I can now return to the naproxen 500 mg I had been using twice a day for arthritis pain in my spine and hands especially. While I was not taking these pills, The arthritis pain was greatly increased. I assume that you suspected these pills may have been the cause of the low score of hemoglobin so it's your call when I can resume taking the pills (I had been taking one morning and evening).   Thanks for your advice.  Tony Cruz

## 2017-04-10 NOTE — Telephone Encounter (Signed)
  Follow up Call-  Call back number 04/09/2017  Post procedure Call Back phone  # [phone]  Permission to leave phone message Yes  Some recent data might be hidden     Patient questions:  Do you have a fever, pain , or abdominal swelling? No. Pain Score  0 *  Have you tolerated food without any problems? Yes.    Have you been able to return to your normal activities? Yes.    Do you have any questions about your discharge instructions: Diet   No. Medications  No. Follow up visit  No.  Do you have questions or concerns about your Care? No.  Actions: * If pain score is 4 or above: No action needed, pain <4.

## 2017-04-10 NOTE — Telephone Encounter (Signed)
Per SN- patient is still on Xarelto for DVT, SN sees where his CBC and Fe are back within normal on OTC Iron and Vit C. If he is to restart Naproxen 1 BID then need to have regular rechecks of labs to be sure bleeding does not occur.   1) Okay to try back on the Naproxen 500mg  1 po BID but take with Mylanta to coat the throat. Rec to continue Protonix 40 mg QD. Taking 30 minutes prior to 1st meal of the day. 2) need to get follow up CBC and Fe panel in 1 month.

## 2017-04-14 ENCOUNTER — Telehealth: Payer: Self-pay | Admitting: Cardiology

## 2017-04-14 ENCOUNTER — Ambulatory Visit (INDEPENDENT_AMBULATORY_CARE_PROVIDER_SITE_OTHER): Payer: Medicare HMO | Admitting: *Deleted

## 2017-04-14 DIAGNOSIS — I495 Sick sinus syndrome: Secondary | ICD-10-CM | POA: Diagnosis not present

## 2017-04-14 NOTE — Telephone Encounter (Signed)
LMOVM for pt to return call. ERI reached on 03-20-17.

## 2017-04-14 NOTE — Telephone Encounter (Signed)
Spoke with pt and reminded pt of remote transmission that is due today. Pt verbalized understanding.   

## 2017-04-14 NOTE — Progress Notes (Signed)
Remote pacemaker transmission.   

## 2017-04-14 NOTE — Telephone Encounter (Signed)
Spoke w/ patient and informed him that his device ERI and a scheduler will be calling to schedule an appt w/ MD / PA / NP. Pt verbalized understanding.

## 2017-04-15 DIAGNOSIS — M25561 Pain in right knee: Secondary | ICD-10-CM | POA: Diagnosis not present

## 2017-04-15 LAB — CUP PACEART REMOTE DEVICE CHECK
Battery Voltage: 2.61 V
Brady Statistic RV Percent Paced: 3 %
Implantable Lead Implant Date: 19990330
Implantable Lead Location: 753859
Implantable Lead Location: 753860
Lead Channel Setting Pacing Amplitude: 2.5 V
Lead Channel Setting Pacing Pulse Width: 0.4 ms
Lead Channel Setting Sensing Sensitivity: 2 mV
MDC IDC LEAD IMPLANT DT: 19990330
MDC IDC MSMT BATTERY IMPEDANCE: 7452 Ohm
MDC IDC MSMT LEADCHNL RA IMPEDANCE VALUE: 67 Ohm
MDC IDC MSMT LEADCHNL RV IMPEDANCE VALUE: 728 Ohm
MDC IDC PG IMPLANT DT: 20081121
MDC IDC SESS DTM: 20180917150302

## 2017-04-15 NOTE — Progress Notes (Signed)
Cardiology Office Note Date:  04/15/2017  Patient ID:  Tony Cruz, DOB 10/20/1933, MRN [id number] PCP:  [name], MD  Cardiologist:  [name] Pulmonology: Dr. Lenna Gilford    Chief Complaint: pacer at [facility]  History of Present Illness: Tony Cruz is a 83 y.o. male with history of sinus node dysfunction w/PPM, syncope thought to be vasovagal/post prandial (rate drop response was programmed on), HTN, OA, barrett's esophagus, DVT Dec 2017 and tx w/xarelto, was decreased to 87m though after repeat LE UKoreashowed persistent thrombus and mention of factor V Leiden was maintained on a/c.  He developed iron def anemia and +FOB, and underwent EGD/colonoscopy without cause for anemia found, and cleared to resume a/c earlier this month, also recommended to avoid use of ASA/ibuoprofen and other possible gastric irritants.  comes to the office today to be seen for Dr. KCaryl Comes  He was last seen by him in Dec 2017 was doing well, mentioned at gen change consideration for Biotronic CLS device given likely neurally mediated sinus node dysfunction/syncope.  He is doing well, comes accompanied by his wife.  He denies any CP, palpitations or SOB, no near syncope or syncope.  He was called to come in to discuss generator change.  He mentions he was cleared to hold his xarelto for a few days for his EGD/colonoscopy.  Device information: MDT dual chamber PPM, implanted 06/19/2007, Dr. KCaryl Comes Sick sinus   Past Medical History:  Diagnosis Date  . Allergy   . Anal fissure   . Anemia   . Backache, unspecified   . Barrett's esophagus   . Diverticulosis of colon (without mention of hemorrhage)   . Esophageal reflux   . hypertension   . Hypertrophy of prostate with urinary obstruction and other lower urinary tract symptoms (LUTS)   . Irritable bowel syndrome   . Obstructive sleep apnea   . Osteoarthrosis, unspecified whether generalized or localized, unspecified site   . Other abnormal glucose   .  Overweight(278.02)   . Pacemaker-MDT    Change out 2008  . Sleep apnea 2016   Uses cpap  . Syncope and collapse   . Tubular adenoma of colon   . Unspecified hereditary and idiopathic peripheral neuropathy     Past Surgical History:  Procedure Laterality Date  . APPENDECTOMY    . CHOLECYSTECTOMY    . PACEMAKER PLACEMENT    . skin cancer removed  12/2012   removed from his face--Dr. GSarajane Jews . TONSILLECTOMY      Current Outpatient Prescriptions  Medication Sig Dispense Refill  . acetaminophen (TYLENOL) 500 MG tablet Take 500-1,000 mg by mouth every 6 (six) hours as needed for headache (or pain).     .Marland KitchenamLODipine (NORVASC) 5 MG tablet Take 1 tablet (5 mg total) by mouth daily. 90 tablet 3  . calcium carbonate (OS-CAL) 600 MG TABS Take 600 mg by mouth daily.      . Cholecalciferol (VITAMIN D) 1000 UNITS capsule Take 1,000 Units by mouth daily.      . ferrous sulfate (FEOSOL) 325 (65 FE) MG tablet Take 1 tablet (325 mg total) by mouth daily with breakfast. Take with vit c 500 mg daily 90 tablet 3  . finasteride (PROSCAR) 5 MG tablet Take 1 tablet by mouth daily.    . hydrocortisone (ANUSOL-HC) 2.5 % rectal cream Place 1 application rectally 2 (two) times daily. 30 g 6  . Lecithin 1200 MG CAPS Take 1 capsule by mouth daily.      .Marland Kitchen  loratadine (CLARITIN) 10 MG tablet Take 10 mg by mouth daily.      Marland Kitchen losartan (COZAAR) 100 MG tablet Take 1 tablet by mouth  daily 90 tablet 3  . Multiple Vitamin (MULTIVITAMIN) capsule Take 1 capsule by mouth daily.      . Omega-3 Fatty Acids (DIALYVITE OMEGA-3 CONCENTRATE) 600 MG CAPS Take 1 capsule by mouth daily.    . pantoprazole (PROTONIX) 40 MG tablet TAKE ONE TABLET DAILY 90 tablet 1  . PAZEO 0.7 % SOLN Place 1 drop into both eyes daily as needed (for allergies).     . Rivaroxaban (XARELTO) 15 MG TABS tablet Take 1 tablet (15 mg total) by mouth daily with supper. 90 tablet 3  . SUPREP BOWEL PREP KIT 17.5-3.13-1.6 GM/180ML SOLN Take 1 kit by mouth as  directed. 354 mL 0  . Tamsulosin HCl (FLOMAX) 0.4 MG CAPS Take 0.4 mg by mouth daily.      . traMADol (ULTRAM) 50 MG tablet Take 1 tablet (50 mg total) by mouth 3 (three) times daily. 270 tablet 1  . vitamin C (ASCORBIC ACID) 500 MG tablet Take 500 mg by mouth daily.     Current Facility-Administered Medications  Medication Dose Route Frequency Provider Last Rate Last Dose  . 0.9 %  sodium chloride infusion  500 mL Intravenous Continuous Pyrtle, Lajuan Lines, MD        Allergies:   Shellfish allergy   Social History:  The patient  reports that he has never smoked. He has never used smokeless tobacco. He reports that he drinks alcohol. He reports that he does not use drugs.   Family History:  The patient's family history includes Coronary artery disease in his father and mother; Diabetes in his sister.  ROS:  Please see the history of present illness. All other systems are reviewed and otherwise negative.   PHYSICAL EXAM:  VS:  There were no vitals taken for this visit. BMI: There is no height or weight on file to calculate BMI. Well nourished, well developed, in no acute distress  HEENT: normocephalic, atraumatic  Neck: no JVD, carotid bruits or masses Cardiac:  RRR; no significant murmurs, no rubs, or gallops Lungs:  CTA b/l, no wheezing, rhonchi or rales  Abd: soft, nontender MS: no deformity or atrophy Ext: no edema  Skin: warm and dry, no rash Neuro:  No gross deficits appreciated Psych: euthymic mood, full affect   PPM site is stable, no tethering or discomfort   EKG:  Done today and reviewed by myself shows SR 86bpm, PR 158bpm, QRS 86bpm, QTc 445m PPM interrogation today and reviewed by myself: battery reached ERI 03/20/17,has switched to VVI programming, unable to get A lead data, V lead measurements are good., High V rates, ?A driven, difficult to tell by EGMs,longest 10seconds, 2.6% V pacing  05/14/16: ETT  Blood pressure demonstrated a normal response to exercise.  There  was no ST segment deviation noted during stress. Baseline EKG demonstrates RBBB with T wave inversions in V1 and V2.  No evidence of exercise induced ischemia at an adequate HR reaching 99% MPHR.  Moderately impaired exercise tolerance. The patient exercised to 6.4 mets.  This is a low risk study.  05/14/16: TTE Study Conclusions - Left ventricle: The cavity size was normal. Wall thickness was   normal. Systolic function was normal. The estimated ejection   fraction was in the range of 50% to 55%. Wall motion was normal;   there were no regional wall motion abnormalities.  Doppler   parameters are consistent with abnormal left ventricular   relaxation (grade 1 diastolic dysfunction). - Aortic valve: There was mild regurgitation. - Ascending aorta: The ascending aorta was mildly dilated. - Left atrium: The atrium was mildly dilated. Impressions: - Normal LV systolic function; grade 1 diastolic dysfunction; mild   AI; mild LAE; ascending aorta measures 4.5 cm; suggest CTA or MRA   to further assess.  10/224/18: CT chest IMPRESSION: Aneurysmal ascending aorta measuring up to 42 mm diameter. Recommend annual imaging followup by CTA or MRA  Recent Labs: 01/14/2017: ALT 17; BUN 16; Creatinine, Ser 0.89; Potassium 4.1; Sodium 135; TSH 2.54 04/09/2017: Hemoglobin 13.8; Platelets 229.0  01/14/2017: Cholesterol 136; HDL 41.30; LDL Cholesterol 66; Total CHOL/HDL Ratio 3; Triglycerides 142.0; VLDL 28.4   CrCl cannot be calculated (Patient's most recent lab result is older than the maximum 21 days allowed.).   Wt Readings from Last 3 Encounters:  04/09/17 212 lb (96.2 kg)  02/21/17 212 lb 3.2 oz (96.3 kg)  01/13/17 212 lb 4 oz (96.3 kg)     Other studies reviewed: Additional studies/records reviewed today include: summarized above  ASSESSMENT AND PLAN:  1. Sick sinus, sinus node dysfunction w/PPM     Device has reached ERI     generator change procedure, risks, benefits were discussed  with the patient and he would like to proceed      The patient was cleared to hold his Xarelto for his EGD/colonoscopy earlier this month, he is asked to hold only the day prior to his generator change  I will staff message Dr.Klein, prior note mentions perhaps change to Biotronik CLS device  2. Hx of vasgvagal syncope     No reports of recurrent syncope  3. HTN     No changes, stable    Disposition: pacer generator change with routine follow up   Current medicines are reviewed at length with the patient today.  The patient did not have any concerns regarding medicines.  Haywood Lasso, PA-C 04/15/2017 7:22 PM     [address] [address] [phone] (office)  [phone] (fax)

## 2017-04-16 ENCOUNTER — Telehealth: Payer: Self-pay | Admitting: Physician Assistant

## 2017-04-16 NOTE — Telephone Encounter (Signed)
New message    We received message from pt in appt request stating he would like flu shot when he is here for his appt. Appointment Request From: Tony Cruz    With Provider: Virl Axe, MD [place]    Preferred Date Range: From 04/17/2017 To 04/17/2017    Preferred Times: Any    Reason: To address the following health maintenance concerns.  Influenza Vaccine    Comments:  I would like to get this shot when I come for the appointment with PA URSUY @ 2 PM

## 2017-04-17 ENCOUNTER — Ambulatory Visit (INDEPENDENT_AMBULATORY_CARE_PROVIDER_SITE_OTHER): Payer: Medicare HMO | Admitting: Physician Assistant

## 2017-04-17 ENCOUNTER — Encounter: Payer: Self-pay | Admitting: *Deleted

## 2017-04-17 VITALS — BP 118/82 | HR 86 | Ht 67.5 in | Wt 218.0 lb

## 2017-04-17 DIAGNOSIS — I495 Sick sinus syndrome: Secondary | ICD-10-CM

## 2017-04-17 DIAGNOSIS — Z45010 Encounter for checking and testing of cardiac pacemaker pulse generator [battery]: Secondary | ICD-10-CM

## 2017-04-17 DIAGNOSIS — I10 Essential (primary) hypertension: Secondary | ICD-10-CM

## 2017-04-17 DIAGNOSIS — Z01818 Encounter for other preprocedural examination: Secondary | ICD-10-CM

## 2017-04-17 DIAGNOSIS — R55 Syncope and collapse: Secondary | ICD-10-CM | POA: Diagnosis not present

## 2017-04-17 NOTE — Patient Instructions (Addendum)
Medication Instructions:   Your physician recommends that you continue on your current medications as directed. Please refer to the Current Medication list given to you today.   If you need a refill on your cardiac medications before your next appointment, please call your pharmacy.  Labwork: RETURN FOR LABS ON 05-19-17    Testing/Procedures: SEE LETTER FOR GEN CHANGE ON  05-21-17    Follow-Up:  AFTER  05-21-17...Marland KitchenMarland KitchenMarland Kitchen   10 TO 14 DAYS POST WOUND FOLLOW UP   [place]  WITH DR Caryl Comes    Any Other Special Instructions Will Be Listed Below (If Applicable).

## 2017-04-22 DIAGNOSIS — H40013 Open angle with borderline findings, low risk, bilateral: Secondary | ICD-10-CM | POA: Diagnosis not present

## 2017-05-12 DIAGNOSIS — Z23 Encounter for immunization: Secondary | ICD-10-CM | POA: Diagnosis not present

## 2017-05-19 ENCOUNTER — Telehealth: Payer: Self-pay | Admitting: Internal Medicine

## 2017-05-19 ENCOUNTER — Other Ambulatory Visit: Payer: Medicare HMO | Admitting: *Deleted

## 2017-05-19 DIAGNOSIS — Z01818 Encounter for other preprocedural examination: Secondary | ICD-10-CM | POA: Diagnosis not present

## 2017-05-19 LAB — BASIC METABOLIC PANEL
BUN / CREAT RATIO: 16 (ref 10–24)
BUN: 12 mg/dL (ref 8–27)
CHLORIDE: 101 mmol/L (ref 96–106)
CO2: 21 mmol/L (ref 20–29)
Calcium: 9 mg/dL (ref 8.6–10.2)
Creatinine, Ser: 0.76 mg/dL (ref 0.76–1.27)
GFR, EST AFRICAN AMERICAN: 99 mL/min/{1.73_m2} (ref >59)
GFR, EST NON AFRICAN AMERICAN: 86 mL/min/{1.73_m2} (ref >59)
Glucose: 104 mg/dL — ABNORMAL HIGH (ref 65–99)
Potassium: 4.3 mmol/L (ref 3.5–5.2)
Sodium: 138 mmol/L (ref 134–144)

## 2017-05-19 LAB — CBC
Hematocrit: 42.4 % (ref 37.5–51.0)
Hemoglobin: 14.4 g/dL (ref 13.0–17.7)
MCH: 32.1 pg (ref 26.6–33.0)
MCHC: 34 g/dL (ref 31.5–35.7)
MCV: 95 fL (ref 79–97)
Platelets: 244 10*3/uL (ref 150–379)
RBC: 4.48 x10E6/uL (ref 4.14–5.80)
RDW: 16.6 % — AB (ref 12.3–15.4)
WBC: 7.8 10*3/uL (ref 3.4–10.8)

## 2017-05-19 LAB — APTT: aPTT: 26 s (ref 24–33)

## 2017-05-19 NOTE — Telephone Encounter (Signed)
New message    Pt is calling about his upcoming procedure. He said his instructions should not eat the night before, but he wants to know his cut off time? Can he have dinner?

## 2017-05-19 NOTE — Telephone Encounter (Signed)
I spoke with pt and told him he should not eat or drink (except water with medications) after midnight prior to procedure.

## 2017-05-21 ENCOUNTER — Ambulatory Visit (HOSPITAL_COMMUNITY)
Admission: RE | Admit: 2017-05-21 | Discharge: 2017-05-21 | Disposition: A | Discharge status: Home / Self Care | Payer: Medicare HMO | Source: Ambulatory Visit | Attending: Internal Medicine | Admitting: Internal Medicine

## 2017-05-21 ENCOUNTER — Encounter (HOSPITAL_COMMUNITY): Payer: Self-pay | Admitting: Internal Medicine

## 2017-05-21 ENCOUNTER — Encounter (HOSPITAL_COMMUNITY)
Admission: RE | Disposition: A | Discharge status: Home / Self Care | Payer: Self-pay | Source: Ambulatory Visit | Attending: Internal Medicine

## 2017-05-21 DIAGNOSIS — G4733 Obstructive sleep apnea (adult) (pediatric): Secondary | ICD-10-CM | POA: Diagnosis not present

## 2017-05-21 DIAGNOSIS — G629 Polyneuropathy, unspecified: Secondary | ICD-10-CM | POA: Insufficient documentation

## 2017-05-21 DIAGNOSIS — K589 Irritable bowel syndrome without diarrhea: Secondary | ICD-10-CM | POA: Diagnosis not present

## 2017-05-21 DIAGNOSIS — K219 Gastro-esophageal reflux disease without esophagitis: Secondary | ICD-10-CM | POA: Insufficient documentation

## 2017-05-21 DIAGNOSIS — Z45010 Encounter for checking and testing of cardiac pacemaker pulse generator [battery]: Secondary | ICD-10-CM | POA: Diagnosis not present

## 2017-05-21 DIAGNOSIS — Z950 Presence of cardiac pacemaker: Secondary | ICD-10-CM | POA: Diagnosis present

## 2017-05-21 DIAGNOSIS — M1990 Unspecified osteoarthritis, unspecified site: Secondary | ICD-10-CM | POA: Diagnosis not present

## 2017-05-21 DIAGNOSIS — I495 Sick sinus syndrome: Secondary | ICD-10-CM | POA: Insufficient documentation

## 2017-05-21 DIAGNOSIS — I10 Essential (primary) hypertension: Secondary | ICD-10-CM | POA: Diagnosis not present

## 2017-05-21 DIAGNOSIS — R55 Syncope and collapse: Secondary | ICD-10-CM | POA: Insufficient documentation

## 2017-05-21 DIAGNOSIS — Z7901 Long term (current) use of anticoagulants: Secondary | ICD-10-CM | POA: Insufficient documentation

## 2017-05-21 DIAGNOSIS — Z8249 Family history of ischemic heart disease and other diseases of the circulatory system: Secondary | ICD-10-CM | POA: Insufficient documentation

## 2017-05-21 HISTORY — PX: PPM GENERATOR CHANGEOUT: EP1233

## 2017-05-21 LAB — SURGICAL PCR SCREEN
MRSA, PCR: NEGATIVE
STAPHYLOCOCCUS AUREUS: NEGATIVE

## 2017-05-21 SURGERY — PPM GENERATOR CHANGEOUT

## 2017-05-21 MED ORDER — FENTANYL CITRATE (PF) 100 MCG/2ML IJ SOLN
INTRAMUSCULAR | Status: AC
  Filled 2017-05-21: qty 2

## 2017-05-21 MED ORDER — ONDANSETRON HCL 4 MG/2ML IJ SOLN: 4.0000 mg | Freq: Four times a day (QID) | INTRAMUSCULAR | Status: DC | PRN

## 2017-05-21 MED ORDER — MIDAZOLAM HCL 5 MG/5ML IJ SOLN
INTRAMUSCULAR | Status: AC
  Filled 2017-05-21: qty 5

## 2017-05-21 MED ORDER — FENTANYL CITRATE (PF) 100 MCG/2ML IJ SOLN
INTRAMUSCULAR | Status: DC | PRN
  Administered 2017-05-21: 25 ug via INTRAVENOUS

## 2017-05-21 MED ORDER — SODIUM CHLORIDE 0.9 % IR SOLN
Status: AC
  Filled 2017-05-21: qty 2

## 2017-05-21 MED ORDER — BUPIVACAINE HCL (PF) 0.25 % IJ SOLN
INTRAMUSCULAR | Status: AC
  Filled 2017-05-21: qty 60

## 2017-05-21 MED ORDER — MUPIROCIN 2 % EX OINT
1.0000 "application " | TOPICAL_OINTMENT | Freq: Once | CUTANEOUS | Status: AC
  Administered 2017-05-21: 1 via TOPICAL
  Filled 2017-05-21: qty 22

## 2017-05-21 MED ORDER — CEFAZOLIN SODIUM-DEXTROSE 2-4 GM/100ML-% IV SOLN
INTRAVENOUS | Status: AC
  Filled 2017-05-21: qty 100

## 2017-05-21 MED ORDER — SODIUM CHLORIDE 0.9 % IV SOLN: INTRAVENOUS | Status: AC

## 2017-05-21 MED ORDER — MUPIROCIN 2 % EX OINT
TOPICAL_OINTMENT | CUTANEOUS | Status: AC
  Administered 2017-05-21: 1 via TOPICAL
  Filled 2017-05-21: qty 22

## 2017-05-21 MED ORDER — BUPIVACAINE HCL (PF) 0.25 % IJ SOLN
INTRAMUSCULAR | Status: DC | PRN
  Administered 2017-05-21: 45 mL

## 2017-05-21 MED ORDER — SODIUM CHLORIDE 0.9 % IV SOLN
INTRAVENOUS | Status: DC
  Administered 2017-05-21: 07:00:00 via INTRAVENOUS

## 2017-05-21 MED ORDER — MIDAZOLAM HCL 5 MG/5ML IJ SOLN
INTRAMUSCULAR | Status: DC | PRN
  Administered 2017-05-21: 2 mg via INTRAVENOUS

## 2017-05-21 MED ORDER — GENTAMICIN SULFATE 40 MG/ML IJ SOLN
80.0000 mg | INTRAMUSCULAR | Status: AC
  Administered 2017-05-21: 80 mg

## 2017-05-21 MED ORDER — ACETAMINOPHEN 325 MG PO TABS: 325.0000 mg | ORAL_TABLET | ORAL | Status: DC | PRN

## 2017-05-21 MED ORDER — CEFAZOLIN SODIUM-DEXTROSE 2-4 GM/100ML-% IV SOLN
2.0000 g | INTRAVENOUS | Status: AC
  Administered 2017-05-21: 2 g via INTRAVENOUS

## 2017-05-21 SURGICAL SUPPLY — 5 items
CABLE SURGICAL S-101-97-12 (CABLE) ×1 IMPLANT
HEMOSTAT SURGICEL 2X4 FIBR (HEMOSTASIS) ×1 IMPLANT
PACEMAKER EDORA 8DR-T MRI (Pacemaker) ×1 IMPLANT
PAD DEFIB LIFELINK (PAD) ×1 IMPLANT
TRAY PACEMAKER INSERTION (PACKS) ×1 IMPLANT

## 2017-05-21 NOTE — Interval H&P Note (Signed)
History and Physical Interval Note:  05/21/2017 7:40 AM  Tony Cruz  has presented today for surgery, with the diagnosis of ERI  The various methods of treatment have been discussed with the patient and family. After consideration of risks, benefits and other options for treatment, the patient has consented to  Procedure(s): PPM GENERATOR CHANGEOUT (N/A) as a surgical intervention .  The patient's history has been reviewed, patient examined, no change in status, stable for surgery.  I have reviewed the patient's chart and labs.  Questions were answered to the patient's satisfaction.     Virl Axe  Held Rivaroxaban for 48 hrs Will refer to Dr Lavone Nian for evalualtion

## 2017-05-21 NOTE — Discharge Instructions (Signed)

## 2017-05-21 NOTE — H&P (Signed)
Patient Care Team: [name], MD as PCP - General   HPI  Tony Cruz is a 83 y.o. male Is admitted for generator replacement of pacemaker for syncope secondary to intermittent sinus node dysfunction last changed out 11/08. Is intercurrent syncope thought to be vasovagal   He has had recurrent episodes, these were postprandial. They were associated with a recognizable prodrome. His rate drop response on his pacemaker activated    DVT Dec 2017 and tx w/xarelto, was decreased to 94m though after repeat LE UKoreashowed persistent thrombus and mention of factor V Leiden was maintained on a/c.   Past Medical History:  Diagnosis Date  . Allergy   . Anal fissure   . Anemia   . Backache, unspecified   . Barrett's esophagus   . Diverticulosis of colon (without mention of hemorrhage)   . Esophageal reflux   . hypertension   . Hypertrophy of prostate with urinary obstruction and other lower urinary tract symptoms (LUTS)   . Irritable bowel syndrome   . Obstructive sleep apnea   . Osteoarthrosis, unspecified whether generalized or localized, unspecified site   . Other abnormal glucose   . Overweight(278.02)   . Pacemaker-MDT    Change out 2008  . Sleep apnea 2016   Uses cpap  . Syncope and collapse   . Tubular adenoma of colon   . Unspecified hereditary and idiopathic peripheral neuropathy     Past Surgical History:  Procedure Laterality Date  . APPENDECTOMY    . CHOLECYSTECTOMY    . PACEMAKER PLACEMENT    . skin cancer removed  12/2012   removed from his face--Dr. GSarajane Jews . TONSILLECTOMY      Current Facility-Administered Medications  Medication Dose Route Frequency Provider Last Rate Last Dose  . 0.9 %  sodium chloride infusion   Intravenous Continuous KDeboraha Sprang MD 50 mL/hr at 05/21/17 0707    . ceFAZolin (ANCEF) IVPB 2g/100 mL premix  2 g Intravenous On Call KDeboraha Sprang MD      . gentamicin (GARAMYCIN) 80 mg in sodium chloride irrigation 0.9 %  500 mL irrigation  80 mg Irrigation On Call KDeboraha Sprang MD        Allergies  Allergen Reactions  . Shellfish Allergy Rash    Sensitive to shellfish      Social History  Substance Use Topics  . Smoking status: Never Smoker  . Smokeless tobacco: Never Used  . Alcohol use 0.0 oz/week     Comment: occasionally      Family History  Problem Relation Age of Onset  . Coronary artery disease Father        bladder cancer  . Coronary artery disease Mother   . Diabetes Sister        2 sisters     No current facility-administered medications on file prior to encounter.    Current Outpatient Prescriptions on File Prior to Encounter  Medication Sig Dispense Refill  . acetaminophen (TYLENOL) 500 MG tablet Take 500-1,000 mg by mouth every 6 (six) hours as needed for headache (or pain).     .Marland KitchenamLODipine (NORVASC) 5 MG tablet Take 1 tablet (5 mg total) by mouth daily. 90 tablet 3  . calcium carbonate (OS-CAL) 600 MG TABS Take 600 mg by mouth daily.      . Cholecalciferol (VITAMIN D) 1000 UNITS capsule Take 1,000 Units by mouth daily.      . ferrous sulfate (FEOSOL) 325 (  65 FE) MG tablet Take 1 tablet (325 mg total) by mouth daily with breakfast. Take with vit c 500 mg daily 90 tablet 3  . finasteride (PROSCAR) 5 MG tablet Take 1 tablet by mouth daily.    . Lecithin 1200 MG CAPS Take 1 capsule by mouth daily.      Marland Kitchen loratadine (CLARITIN) 10 MG tablet Take 10 mg by mouth daily.      Marland Kitchen losartan (COZAAR) 100 MG tablet Take 1 tablet by mouth  daily 90 tablet 3  . Multiple Vitamin (MULTIVITAMIN) capsule Take 1 capsule by mouth daily.      . naproxen (NAPROSYN) 500 MG tablet Take 500 mg by mouth 2 (two) times daily.     . pantoprazole (PROTONIX) 40 MG tablet TAKE ONE TABLET DAILY 90 tablet 1  . Rivaroxaban (XARELTO) 15 MG TABS tablet Take 1 tablet (15 mg total) by mouth daily with supper. 90 tablet 3  . Tamsulosin HCl (FLOMAX) 0.4 MG CAPS Take 0.4 mg by mouth daily.      . traMADol (ULTRAM)  50 MG tablet Take 1 tablet (50 mg total) by mouth 3 (three) times daily. 270 tablet 1  . vitamin C (ASCORBIC ACID) 500 MG tablet Take 500 mg by mouth daily.    . hydrocortisone (ANUSOL-HC) 2.5 % rectal cream Place 1 application rectally 2 (two) times daily. (Patient taking differently: Place 1 application rectally 2 (two) times daily as needed (fissure). ) 30 g 6  . SUPREP BOWEL PREP KIT 17.5-3.13-1.6 GM/180ML SOLN Take 1 kit by mouth as directed. 354 mL 0      Review of Systems negative except from HPI and PMH  Physical Exam BP 139/82   Pulse 69   Temp 97.9 F (36.6 C)   Ht 5' 8" (1.727 m)   Wt 205 lb (93 kg)   SpO2 94%   BMI 31.17 kg/m  Well developed and well nourished in no acute distress HENT normal E scleral and icterus clear Neck Supple JVP flat; carotids brisk and full Clear to ausculation Regular rate and rhythm, no murmurs gallops or rub Soft with active bowel sounds No clubbing cyanosis  Edema Alert and oriented, grossly normal motor and sensory function Skin Warm and Dry    Assessment and  Plan Syncope presumed neurally mediated  Sinus node dysfunction-prob neurally   Pacemaker    Hypertension   Balance Neuropathy  DEvice at ERI \We have reviewed the benefits and risks of generator replacement.  These include but are not limited to lead fracture and infection.  The patient understands, agrees and is willing to proceed.     Will implant with Biotronik CLS given proposed mechanism of syncope and sinus node dysfunction

## 2017-05-26 ENCOUNTER — Encounter: Payer: Self-pay | Admitting: Internal Medicine

## 2017-05-26 NOTE — Telephone Encounter (Signed)
Spoke to patient about his wound. Patient states that he believes that the "pink spots" on his undershirt were d/t the shower he took on Saturday. I explained to patient that the issue with any drainage coming from his site is that bacteria may be able to enter. I offered patient an appt for either today or tomorrow, so that his site could be assessed. Patient stated that he would prefer an appt tomorrow bc he has a meeting tonight. Appt made for tomorrow 10/30 @ 1500. Patient verbalized understanding.

## 2017-05-27 ENCOUNTER — Ambulatory Visit (INDEPENDENT_AMBULATORY_CARE_PROVIDER_SITE_OTHER): Payer: Self-pay | Admitting: *Deleted

## 2017-05-27 DIAGNOSIS — I495 Sick sinus syndrome: Secondary | ICD-10-CM

## 2017-05-27 MED ORDER — CEPHALEXIN 500 MG PO CAPS: 500.0000 mg | ORAL_CAPSULE | Freq: Three times a day (TID) | ORAL | 0 refills | Status: DC

## 2017-05-27 NOTE — Progress Notes (Signed)
Mr. Si comes to the [facility] today s/p pacemaker generator replacement 05/21/17 by [name]. Dermabond intact over left chest incision. Old bruising noted inferior to device pocket. Incision edges approximated, no evident un-approximation. Patient's undershirt has 3-4 small spots of old pink drainage, Mr. Manson reports that he put this shirt on this morning. He has noted 1 small spot on each shirt since his shower on Saturday. Pocket boggy upon palpation suggesting small amount of fluid in device pocket. [name] evaluated incision- gave verbal order for Keflex 500mg  TID for 7 days and to cover the incision with gauze to protect Mr. Baiz clothing. Rx sent to [facility] on [address]. Patient instructed to call back to the [facility] if he notes any increased redness, swelling, drainage or if he develops a fever over 101 degrees F or chills. He verbalizes understanding. Follow up as scheduled with the [facility] 06/02/17.

## 2017-05-28 ENCOUNTER — Telehealth: Payer: Self-pay | Admitting: *Deleted

## 2017-05-28 NOTE — Telephone Encounter (Signed)
Patient thought someone had called, no message left. I can't see that anyone has called him today. He reports no drainage from left chest incision overnight and that he has started his antibiotics. Confirmed appt for Monday 06/02/17.

## 2017-06-02 ENCOUNTER — Ambulatory Visit (INDEPENDENT_AMBULATORY_CARE_PROVIDER_SITE_OTHER): Payer: Medicare HMO | Admitting: *Deleted

## 2017-06-02 DIAGNOSIS — I495 Sick sinus syndrome: Secondary | ICD-10-CM | POA: Diagnosis not present

## 2017-06-02 LAB — CUP PACEART INCLINIC DEVICE CHECK
Battery Remaining Percentage: 100 %
Brady Statistic RA Percent Paced: 45 %
Date Time Interrogation Session: 20181105160422
Implantable Lead Implant Date: 19990330
Implantable Lead Location: 753859
Implantable Lead Location: 753860
Implantable Lead Model: 5068
Lead Channel Impedance Value: 390 Ohm
Lead Channel Pacing Threshold Amplitude: 0.4 V
Lead Channel Pacing Threshold Amplitude: 0.9 V
Lead Channel Sensing Intrinsic Amplitude: 2.4 mV
MDC IDC LEAD IMPLANT DT: 19990330
MDC IDC MSMT LEADCHNL RA PACING THRESHOLD PULSEWIDTH: 0.4 ms
MDC IDC MSMT LEADCHNL RV IMPEDANCE VALUE: 546 Ohm
MDC IDC MSMT LEADCHNL RV PACING THRESHOLD PULSEWIDTH: 0.4 ms
MDC IDC MSMT LEADCHNL RV SENSING INTR AMPL: 9.3 mV
MDC IDC PG IMPLANT DT: 20181024
MDC IDC STAT BRADY RV PERCENT PACED: 0 %
Pulse Gen Serial Number: 69203177

## 2017-06-02 NOTE — Progress Notes (Signed)
Wound check appointment. Dermabond removed. Wound without redness or edema. Incision edges approximated, wound well healed. Normal device function. Thresholds, sensing, and impedances consistent with implant measurements. Device programed at chronic values s/p gen change. Histogram distribution appropriate for patient and level of activity. No mode switches or high ventricular rates noted. Patient educated about wound care and arm mobility. ROV in 3 months with SK 1/28

## 2017-06-17 DIAGNOSIS — N401 Enlarged prostate with lower urinary tract symptoms: Secondary | ICD-10-CM | POA: Diagnosis not present

## 2017-06-24 DIAGNOSIS — R3915 Urgency of urination: Secondary | ICD-10-CM | POA: Diagnosis not present

## 2017-06-24 DIAGNOSIS — N401 Enlarged prostate with lower urinary tract symptoms: Secondary | ICD-10-CM | POA: Diagnosis not present

## 2017-07-15 ENCOUNTER — Encounter: Payer: Medicare HMO | Admitting: Internal Medicine

## 2017-08-04 ENCOUNTER — Other Ambulatory Visit: Payer: Self-pay | Admitting: Pulmonary Disease

## 2017-08-11 ENCOUNTER — Other Ambulatory Visit: Payer: Self-pay | Admitting: Orthopedic Surgery

## 2017-08-11 DIAGNOSIS — L821 Other seborrheic keratosis: Secondary | ICD-10-CM | POA: Diagnosis not present

## 2017-08-11 DIAGNOSIS — M25561 Pain in right knee: Secondary | ICD-10-CM

## 2017-08-11 DIAGNOSIS — L988 Other specified disorders of the skin and subcutaneous tissue: Secondary | ICD-10-CM | POA: Diagnosis not present

## 2017-08-11 DIAGNOSIS — Z85828 Personal history of other malignant neoplasm of skin: Secondary | ICD-10-CM | POA: Diagnosis not present

## 2017-08-12 ENCOUNTER — Encounter: Payer: Self-pay | Admitting: Internal Medicine

## 2017-08-12 ENCOUNTER — Telehealth: Payer: Self-pay | Admitting: Pulmonary Disease

## 2017-08-12 ENCOUNTER — Encounter: Payer: Self-pay | Admitting: Pulmonary Disease

## 2017-08-12 ENCOUNTER — Telehealth: Payer: Self-pay

## 2017-08-12 MED ORDER — PANTOPRAZOLE SODIUM 40 MG PO TBEC: 40.0000 mg | DELAYED_RELEASE_TABLET | Freq: Every day | ORAL | 4 refills | Status: DC

## 2017-08-12 NOTE — Telephone Encounter (Signed)
[facility] they need a written consent form. She faxed over the written consent form. Just sgn it and sent it back   fax [phone]

## 2017-08-12 NOTE — Telephone Encounter (Signed)
Called and spoke with pt and he is aware of refill that has been sent to the pharmacy for the 90 day supply.  This was sent in for 90 day supply to [employer].  Pt scheduled an appt with SN in June.

## 2017-08-12 NOTE — Telephone Encounter (Signed)
rec'd email from patient regarding medication refill for pantoprozole 40mg  daily  [name], My recent 30 day non-renewable prescription for pantoprozole says I must make an office visit before I can get a 90 day prescription refill. I that is true, then I'll need to schedule an appointment with you but what is the reason since I have been advised to take the pantoprozole forever.? Tony Cruz  Routing message to SN to advise

## 2017-08-12 NOTE — Telephone Encounter (Signed)
[place] faxed to [facility] imaging with SN signature on it. Nothing further is needed.

## 2017-08-12 NOTE — Telephone Encounter (Signed)
Per SN:  Ok to stop Xarelto for 1 day so pt can have a CT arthrogram.  I called [facility] scheduling at [phone] and left a detailed message advising them that this was ok for pt. I called pt to let him know as well. Will keep encounter closed to make sure [facility] received the message.

## 2017-08-12 NOTE — Telephone Encounter (Signed)
rec'd another email same day from pt regarding [place] with contrast  Dr. Lenna Gilford; I apologize for the frequency of these questions today. I am in the dark a little on how the system works. I am trying to get a CAT scan with dye on my right knee which has been injured for about 15 months and seems to be getting weaker. I was told that they needed your approval to take me off Xarelto for one day prior to the CAT scan. I don't know why that's needed but they are holding the booking of the scan awaiting your advice as I understand it. The circulation in the calf of my left leg where the blood clot was located seems to be about the same as when you last looked at it in June. I'm functional but still slightly swollen in the calf of the left knee. Please let me know if you have further advice on the DVT. I will proceed with the CAT scan and effort to restore the right knee with surgery if necessary. Thanks for your interest in keeping me mobile. Tony Cruz  Routing message to [facility] for review  Please advise

## 2017-08-13 ENCOUNTER — Encounter: Payer: Self-pay | Admitting: Pulmonary Disease

## 2017-08-13 NOTE — Telephone Encounter (Signed)
Per 08/13/17 e-mail from patient: Tony Cruz to [name], MD 08/13/17 4:28 PM  This request is to Dr. Jeannine Kitten nurse who is trying to help me set up an appointment with [facility] to do a CAT scan on my right knee. The technician at [facility] [phone]) says she needs a fax document showing Dr. Jeannine Kitten approval of me abstaining from my Xeralto for one day prior to the CAT Scan with dye they will perform. I fear that the call from your office and the message that was left has been lost in the telephone/menu system there. How do we get this request resolved? My knee is in pain and I would appreciate it if someone could pass the required document into Tony Cruz's hands so I can begin to get started on a resolution of the knee pain.  Thanks for going the "second mile" to help me get this CAT scan scheduled.  Tony Cruz please advise, thank you

## 2017-08-14 NOTE — Telephone Encounter (Signed)
Denton Ar with [facility] is returning [employer], [phone]

## 2017-08-14 NOTE — Telephone Encounter (Signed)
I faxed a letter to [facility] advising them it was ok to stop Xarelto for 1 day. I will let pt know through email.

## 2017-08-25 DIAGNOSIS — R69 Illness, unspecified: Secondary | ICD-10-CM | POA: Diagnosis not present

## 2017-08-26 ENCOUNTER — Ambulatory Visit: Payer: Medicare HMO | Admitting: Internal Medicine

## 2017-08-26 ENCOUNTER — Encounter: Payer: Self-pay | Admitting: Internal Medicine

## 2017-08-26 VITALS — BP 144/86 | HR 94 | Ht 69.0 in | Wt 226.0 lb

## 2017-08-26 DIAGNOSIS — R609 Edema, unspecified: Secondary | ICD-10-CM | POA: Diagnosis not present

## 2017-08-26 DIAGNOSIS — R55 Syncope and collapse: Secondary | ICD-10-CM

## 2017-08-26 DIAGNOSIS — I10 Essential (primary) hypertension: Secondary | ICD-10-CM | POA: Diagnosis not present

## 2017-08-26 DIAGNOSIS — I495 Sick sinus syndrome: Secondary | ICD-10-CM | POA: Diagnosis not present

## 2017-08-26 MED ORDER — CHLORTHALIDONE 25 MG PO TABS: 25.0000 mg | ORAL_TABLET | Freq: Every day | ORAL | 3 refills | Status: DC

## 2017-08-26 NOTE — Patient Instructions (Addendum)
Medication Instructions:  Your physician has recommended you make the following change in your medication:   1. Stop Amlodipine  2. Begin Chlorthalidone, one tablet (25mg ) every day  Labwork: Your physician recommends that you return for lab work in: 1 week for a BMP  Testing/Procedures: None ordered.   Follow-Up: Your physician recommends that you schedule a follow-up appointment in: 8 weeks with Tommye Standard, PA  .Remote monitoring is used to monitor your Pacemaker from home. This monitoring reduces the number of office visits required to check your device to one time per year. It allows [place] to keep an eye on the functioning of your device to ensure it is working properly. You are scheduled for a device check from home on  11/25/2017. You may send your transmission at any time that day. If you have a wireless device, the transmission will be sent automatically. After your physician reviews your transmission, you will receive a postcard with your next transmission date.   Any Other Special Instructions Will Be Listed Below (If Applicable).  Chlorthalidone tablets What is this medicine? CHLORTHALIDONE (klor THAL i done) is a diuretic. It increases the amount of urine passed, which causes the body to lose salt and water. This medicine is used to treat high blood pressure and edema or water retention. This medicine may be used for other purposes; ask your health care provider or pharmacist if you have questions. COMMON BRAND NAME(S): Thalitone What should I tell my health care provider before I take this medicine? They need to know if you have any of these conditions: -asthma -diabetes -gout -kidney disease -liver disease -parathyroid disease -systemic lupus erythematosus (SLE) -taking cortisone, digoxin, lithium carbonate, or drugs for diabetes -an unusual or allergic reaction to chlorthalidone, sulfa drugs, other medicines, foods, dyes, or preservatives -pregnant or trying to get  pregnant -breast-feeding How should I use this medicine? Take this medicine by mouth with a glass of water. Follow the directions on the prescription label. It is best to take your dose in the morning with food. Take your medicine at regular intervals. Do not take your medicine more often than directed. Do not stop taking except on your doctor's advice. Talk to your pediatrician regarding the use of this medicine in children. Special care may be needed. Overdosage: If you think you have taken too much of this medicine contact a poison control center or emergency room at once. NOTE: This medicine is only for you. Do not share this medicine with others. What if I miss a dose? If you miss a dose, take it as soon as you can. If it is almost time for your next dose, take only that dose. Do not take double or extra doses. What may interact with this medicine? -barbiturate medicines for sleep or seizure control -digoxin -lithium -medicines for diabetes -norepinephrine -other medicines for high blood pressure -some pain medicines -steroid hormones like prednisone, cortisone, hydrocortisone, corticotropin -tubocurarine This list may not describe all possible interactions. Give your health care provider a list of all the medicines, herbs, non-prescription drugs, or dietary supplements you use. Also tell them if you smoke, drink alcohol, or use illegal drugs. Some items may interact with your medicine. What should I watch for while using this medicine? Visit your doctor or health care professional for regular check ups. Check your blood pressure as directed. Ask your doctor or health care professional what your blood pressure should be and when you should contact him or her. You may need to be  on a special diet while taking this medicine. Ask your doctor. You may get drowsy or dizzy. Do not drive, use machinery, or do anything that needs mental alertness until you know how this medicine affects you. Do  not stand or sit up quickly, especially if you are an older patient. This reduces the risk of dizzy or fainting spells. Alcohol may interfere with the effect of this medicine. Avoid alcoholic drinks. This medicine may affect your blood sugar level. If you have diabetes, check with your doctor or health care professional before changing the dose of your diabetic medicine. This medicine can make you more sensitive to the sun. Keep out of the sun. If you cannot avoid being in the sun, wear protective clothing and use sunscreen. Do not use sun lamps or tanning beds/booths. What side effects may I notice from receiving this medicine? Side effects that you should report to your doctor or health care professional as soon as possible: -allergic reactions like skin rash, itching or hives, swelling of the face, lips, or tongue -dark urine -dry mouth -excess thirst -fast, irregular heart rate -fever, chills -muscle pain, cramps, or spasm -nausea, vomiting -redness, blistering, peeling or loosening of the skin, including inside the mouth -tingling, pain or numbness in the hands or feet -unusually weak or tired -yellowing of the eyes or skin Side effects that usually do not require medical attention (report to your doctor or health care professional if they continue or are bothersome): -diarrhea or constipation -headache -impotence -loss of appetite -stomach upset This list may not describe all possible side effects. Call your doctor for medical advice about side effects. You may report side effects to FDA at 1-800-FDA-1088. Where should I keep my medicine? Keep out of the reach of children. Store at room temperature between 15 and 30 degrees C (59 and 86 degrees F). Keep container tightly closed. Throw away any unused medicine after the expiration date. NOTE: This sheet is a summary. It may not cover all possible information. If you have questions about this medicine, talk to your doctor, pharmacist,  or health care provider.  2018 [employer] (2007-10-20 15:28:48)      If you need a refill on your cardiac medications before your next appointment, please call your pharmacy.

## 2017-08-26 NOTE — Progress Notes (Signed)
Patient Care Team: [name], MD as PCP - General   HPI  Tony Cruz is a 84 y.o. male is seen in followup for syncope secondary to intermittent sinus node dysfunction for which a pacemaker was previously implanted and changed out 11/08. He has had intercurrent syncope thought to be vasovagal   He has had no interval syncope.  He denies chest pain shortness of breath.  He has noted increasing swelling of his legs bilaterally; the left leg has a history of a prior DVT.  He is on anticoagulation.  He also is concerned that his peripheral neuropathy is worsening.  This is manifest by when he stands up and it takes minutes before he feels like his legs are ready to walk.  He denies accompanying lightheadedness.  Date Cr K Hgb  10/18 0.76 4.3 14.4           Past Medical History:  Diagnosis Date  . Allergy   . Anal fissure   . Anemia   . Backache, unspecified   . Barrett's esophagus   . Diverticulosis of colon (without mention of hemorrhage)   . Esophageal reflux   . hypertension   . Hypertrophy of prostate with urinary obstruction and other lower urinary tract symptoms (LUTS)   . Irritable bowel syndrome   . Obstructive sleep apnea   . Osteoarthrosis, unspecified whether generalized or localized, unspecified site   . Other abnormal glucose   . Overweight(278.02)   . Pacemaker-MDT    Change out 2008  . Sleep apnea 2016   Uses cpap  . Syncope and collapse   . Tubular adenoma of colon   . Unspecified hereditary and idiopathic peripheral neuropathy     Past Surgical History:  Procedure Laterality Date  . APPENDECTOMY    . CHOLECYSTECTOMY    . PACEMAKER PLACEMENT    . PPM GENERATOR CHANGEOUT N/A 05/21/2017   Procedure: PPM GENERATOR CHANGEOUT;  Surgeon: Deboraha Sprang, MD;  Location: [facility];  Service: Cardiovascular;  Laterality: N/A;  . skin cancer removed  12/2012   removed from his face--Dr. Sarajane Jews  . TONSILLECTOMY      Current  Outpatient Medications  Medication Sig Dispense Refill  . acetaminophen (TYLENOL) 500 MG tablet Take 500-1,000 mg by mouth every 6 (six) hours as needed for headache (or pain).     Marland Kitchen amLODipine (NORVASC) 5 MG tablet Take 1 tablet (5 mg total) by mouth daily. 90 tablet 3  . calcium carbonate (OS-CAL) 600 MG TABS Take 600 mg by mouth daily.      . Cholecalciferol (VITAMIN D) 1000 UNITS capsule Take 1,000 Units by mouth daily.      . ferrous sulfate (FEOSOL) 325 (65 FE) MG tablet Take 1 tablet (325 mg total) by mouth daily with breakfast. Take with vit c 500 mg daily 90 tablet 3  . finasteride (PROSCAR) 5 MG tablet Take 1 tablet by mouth daily.    . hydrocortisone (ANUSOL-HC) 2.5 % rectal cream Place 1 application rectally 2 (two) times daily. (Patient taking differently: Place 1 application rectally 2 (two) times daily as needed (fissure). ) 30 g 6  . KETOTIFEN FUMARATE OP Place 1 application into both eyes at bedtime as needed (itchy/dry eyes).    . Lecithin 1200 MG CAPS Take 1 capsule by mouth daily.      Marland Kitchen loratadine (CLARITIN) 10 MG tablet Take 10 mg by mouth daily.      Marland Kitchen losartan (COZAAR)  100 MG tablet Take 1 tablet by mouth  daily 90 tablet 3  . Multiple Vitamin (MULTIVITAMIN) capsule Take 1 capsule by mouth daily.      . naproxen (NAPROSYN) 500 MG tablet Take 500 mg by mouth 2 (two) times daily.     . Omega-3 Fatty Acids (FISH OIL) 1200 MG CAPS Take 1,200 mg by mouth daily.    . pantoprazole (PROTONIX) 40 MG tablet Take 1 tablet (40 mg total) by mouth daily. 90 tablet 4  . Probiotic Product (PROBIOTIC PO) Take 1 capsule by mouth daily.    . Rivaroxaban (XARELTO) 15 MG TABS tablet Take 1 tablet (15 mg total) by mouth daily with supper. 90 tablet 3  . Tamsulosin HCl (FLOMAX) 0.4 MG CAPS Take 0.4 mg by mouth daily.      . traMADol (ULTRAM) 50 MG tablet Take 1 tablet (50 mg total) by mouth 3 (three) times daily. 270 tablet 1  . vitamin C (ASCORBIC ACID) 500 MG tablet Take 500 mg by mouth  daily.     Current Facility-Administered Medications  Medication Dose Route Frequency Provider Last Rate Last Dose  . 0.9 %  sodium chloride infusion  500 mL Intravenous Continuous Pyrtle, Lajuan Lines, MD        Allergies  Allergen Reactions  . Shellfish Allergy Rash    Sensitive to shellfish    Review of Systems negative except from HPI and PMH  Physical Exam BP (!) 144/86   Pulse 94   Ht 5\' 9"  (1.753 m)   Wt 226 lb (102.5 kg)   SpO2 95%   BMI 33.37 kg/m  Well developed and nourished in no acute distress HENT normal Neck supple with JVP 8 Clear Regular rate and rhythm, no murmurs or gallops Abd-soft with active BS No Clubbing cyanosis 2+edema Skin-warm and dry A & Oriented  Grossly normal sensory and motor function     ECG sinus rhythm at 71 Interval 16/11/41 Otherwise normal  Assessment and plan  Syncope presumed neurally mediated  Sinus node dysfunction-prob neurally   Pacemaker The patient's device was interrogated.  The information was reviewed. No changes were made in the programming.   Hypertension   Peripheral edema  Peripheral neuropathy    No interval syncope  Blood pressure is modestly controlled.  He has significant edema.  His diet is replete of sodium.  We have reviewed the importance of salt restriction.  His amlodipine may be contributing to his edema we will discontinue the amlodipine and put him on chlorthalidone 25 mg.  We will plan to check a metabolic profile in 2 weeks time.  And reassess his edema in 8 weeks.  I was also concerned that the orthostatic instability was in fact hypotension.  No orthostatic hypotension was demonstrable.  I suggested that he follow-up with neurology.  We spent more than 50% of our >40  min visit in face to face counseling regarding the above

## 2017-08-27 ENCOUNTER — Other Ambulatory Visit: Payer: Medicare HMO

## 2017-08-27 ENCOUNTER — Inpatient Hospital Stay
Admission: RE | Admit: 2017-08-27 | Discharge: 2017-08-27 | Disposition: A | Discharge status: Home / Self Care | Payer: Medicare HMO | Source: Ambulatory Visit | Attending: Orthopedic Surgery | Admitting: Orthopedic Surgery

## 2017-08-28 LAB — CUP PACEART INCLINIC DEVICE CHECK
Implantable Lead Implant Date: 19990330
Implantable Lead Location: 753859
Implantable Lead Location: 753860
Implantable Lead Model: 5092
Implantable Pulse Generator Implant Date: 20181024
Lead Channel Impedance Value: 370 Ohm
Lead Channel Pacing Threshold Amplitude: 1 V
Lead Channel Pacing Threshold Pulse Width: 0.4 ms
Lead Channel Setting Pacing Pulse Width: 0.4 ms
MDC IDC LEAD IMPLANT DT: 19990330
MDC IDC MSMT LEADCHNL RA PACING THRESHOLD AMPLITUDE: 0.7 V
MDC IDC MSMT LEADCHNL RA SENSING INTR AMPL: 2.4 mV
MDC IDC MSMT LEADCHNL RV IMPEDANCE VALUE: 526 Ohm
MDC IDC MSMT LEADCHNL RV PACING THRESHOLD PULSEWIDTH: 0.4 ms
MDC IDC MSMT LEADCHNL RV SENSING INTR AMPL: 3.3 mV
MDC IDC SESS DTM: 20190129194200
MDC IDC SET LEADCHNL RA PACING AMPLITUDE: 2.4 V
MDC IDC SET LEADCHNL RV PACING AMPLITUDE: 2.4 V
Pulse Gen Model: 407145
Pulse Gen Serial Number: 69203177

## 2017-09-02 ENCOUNTER — Other Ambulatory Visit: Payer: Medicare HMO

## 2017-09-02 DIAGNOSIS — R609 Edema, unspecified: Secondary | ICD-10-CM

## 2017-09-02 DIAGNOSIS — R55 Syncope and collapse: Secondary | ICD-10-CM

## 2017-09-02 DIAGNOSIS — I10 Essential (primary) hypertension: Secondary | ICD-10-CM

## 2017-09-02 LAB — BASIC METABOLIC PANEL
BUN/Creatinine Ratio: 15 (ref 10–24)
BUN: 13 mg/dL (ref 8–27)
CO2: 24 mmol/L (ref 20–29)
Calcium: 9 mg/dL (ref 8.6–10.2)
Chloride: 90 mmol/L — ABNORMAL LOW (ref 96–106)
Creatinine, Ser: 0.86 mg/dL (ref 0.76–1.27)
GFR calc Af Amer: 94 mL/min/{1.73_m2} (ref >59)
GFR calc non Af Amer: 81 mL/min/{1.73_m2} (ref >59)
GLUCOSE: 123 mg/dL — AB (ref 65–99)
POTASSIUM: 3.9 mmol/L (ref 3.5–5.2)
SODIUM: 130 mmol/L — AB (ref 134–144)

## 2017-09-11 DIAGNOSIS — R69 Illness, unspecified: Secondary | ICD-10-CM | POA: Diagnosis not present

## 2017-09-16 ENCOUNTER — Ambulatory Visit
Admission: RE | Admit: 2017-09-16 | Discharge: 2017-09-16 | Disposition: A | Discharge status: Home / Self Care | Payer: Medicare HMO | Source: Ambulatory Visit | Attending: Orthopedic Surgery | Admitting: Orthopedic Surgery

## 2017-09-16 DIAGNOSIS — M19011 Primary osteoarthritis, right shoulder: Secondary | ICD-10-CM | POA: Diagnosis not present

## 2017-09-16 DIAGNOSIS — M25561 Pain in right knee: Secondary | ICD-10-CM

## 2017-09-16 MED ORDER — IOPAMIDOL (ISOVUE-M 200) INJECTION 41%
40.0000 mL | Freq: Once | INTRAMUSCULAR | Status: AC
  Administered 2017-09-16: 40 mL via INTRA_ARTICULAR

## 2017-09-21 ENCOUNTER — Other Ambulatory Visit: Payer: Self-pay | Admitting: Pulmonary Disease

## 2017-09-22 ENCOUNTER — Other Ambulatory Visit: Payer: Self-pay | Admitting: Pulmonary Disease

## 2017-09-22 DIAGNOSIS — M25561 Pain in right knee: Secondary | ICD-10-CM | POA: Diagnosis not present

## 2017-09-22 NOTE — Telephone Encounter (Signed)
Patient is requesting refill of Ultram 50 mg 3 times daily however patient has not been seen in [place] since 12/2016 and was supposed to follow up 4 weeks later.  Dr. Lenna Gilford please advise. Thank you!   Routing to [place], LPN and Dr. Lenna Gilford

## 2017-09-23 ENCOUNTER — Other Ambulatory Visit: Payer: Self-pay | Admitting: *Deleted

## 2017-09-23 MED ORDER — TRAMADOL HCL 50 MG PO TABS: 50.0000 mg | ORAL_TABLET | Freq: Three times a day (TID) | ORAL | 1 refills | Status: DC

## 2017-09-23 NOTE — Telephone Encounter (Signed)
Per SN ok to refill Tramadol 50mg  270 tabs 1 refill. Take 1 tab 3 times per day as needed for pain.  Called in to pharmacy.

## 2017-09-26 ENCOUNTER — Encounter: Payer: Self-pay | Admitting: Pulmonary Disease

## 2017-09-26 NOTE — Telephone Encounter (Signed)
SN, Please advise if we need to change Rx for patient. Thanks.

## 2017-09-29 ENCOUNTER — Other Ambulatory Visit: Payer: Self-pay | Admitting: *Deleted

## 2017-09-29 DIAGNOSIS — G4733 Obstructive sleep apnea (adult) (pediatric): Secondary | ICD-10-CM | POA: Diagnosis not present

## 2017-09-29 MED ORDER — METOPROLOL SUCCINATE ER 50 MG PO TB24: 50.0000 mg | ORAL_TABLET | Freq: Every day | ORAL | 2 refills | Status: DC

## 2017-09-29 NOTE — Telephone Encounter (Signed)
Per SN change Losartan to Metoprolol ER 50mg  #90 refill as needed. Take 1 by mouth everyday.  Needs follow up 4-6 weeks.  Called Patient and explained the change in medication.  [facility] [employer] [facility].  Follow up appointment is made for April 2,2019 at 10:00.  Nothing further needed.

## 2017-09-30 ENCOUNTER — Encounter: Payer: Self-pay | Admitting: Pulmonary Disease

## 2017-09-30 NOTE — Telephone Encounter (Signed)
Copy of email sent to SN. Awaiting response/

## 2017-10-01 ENCOUNTER — Other Ambulatory Visit (INDEPENDENT_AMBULATORY_CARE_PROVIDER_SITE_OTHER): Payer: Medicare HMO

## 2017-10-01 ENCOUNTER — Encounter: Payer: Self-pay | Admitting: Pulmonary Disease

## 2017-10-01 ENCOUNTER — Ambulatory Visit: Payer: Medicare HMO | Admitting: Pulmonary Disease

## 2017-10-01 ENCOUNTER — Ambulatory Visit: Payer: Medicare HMO | Admitting: Urgent Care

## 2017-10-01 VITALS — BP 126/74 | HR 76 | Temp 98.2°F | Ht 69.0 in | Wt 214.4 lb

## 2017-10-01 DIAGNOSIS — K219 Gastro-esophageal reflux disease without esophagitis: Secondary | ICD-10-CM

## 2017-10-01 DIAGNOSIS — I10 Essential (primary) hypertension: Secondary | ICD-10-CM

## 2017-10-01 DIAGNOSIS — D6851 Activated protein C resistance: Secondary | ICD-10-CM

## 2017-10-01 DIAGNOSIS — Z950 Presence of cardiac pacemaker: Secondary | ICD-10-CM | POA: Diagnosis not present

## 2017-10-01 DIAGNOSIS — M159 Polyosteoarthritis, unspecified: Secondary | ICD-10-CM

## 2017-10-01 DIAGNOSIS — R7301 Impaired fasting glucose: Secondary | ICD-10-CM | POA: Diagnosis not present

## 2017-10-01 DIAGNOSIS — R42 Dizziness and giddiness: Secondary | ICD-10-CM

## 2017-10-01 DIAGNOSIS — D509 Iron deficiency anemia, unspecified: Secondary | ICD-10-CM | POA: Diagnosis not present

## 2017-10-01 DIAGNOSIS — N138 Other obstructive and reflux uropathy: Secondary | ICD-10-CM

## 2017-10-01 DIAGNOSIS — Z862 Personal history of diseases of the blood and blood-forming organs and certain disorders involving the immune mechanism: Secondary | ICD-10-CM

## 2017-10-01 DIAGNOSIS — I82402 Acute embolism and thrombosis of unspecified deep veins of left lower extremity: Secondary | ICD-10-CM

## 2017-10-01 DIAGNOSIS — G4733 Obstructive sleep apnea (adult) (pediatric): Secondary | ICD-10-CM

## 2017-10-01 DIAGNOSIS — Z8781 Personal history of (healed) traumatic fracture: Secondary | ICD-10-CM

## 2017-10-01 DIAGNOSIS — G609 Hereditary and idiopathic neuropathy, unspecified: Secondary | ICD-10-CM

## 2017-10-01 DIAGNOSIS — G44309 Post-traumatic headache, unspecified, not intractable: Secondary | ICD-10-CM | POA: Diagnosis not present

## 2017-10-01 DIAGNOSIS — N401 Enlarged prostate with lower urinary tract symptoms: Secondary | ICD-10-CM

## 2017-10-01 DIAGNOSIS — K5730 Diverticulosis of large intestine without perforation or abscess without bleeding: Secondary | ICD-10-CM

## 2017-10-01 DIAGNOSIS — I495 Sick sinus syndrome: Secondary | ICD-10-CM | POA: Diagnosis not present

## 2017-10-01 DIAGNOSIS — Z8679 Personal history of other diseases of the circulatory system: Secondary | ICD-10-CM

## 2017-10-01 DIAGNOSIS — M150 Primary generalized (osteo)arthritis: Secondary | ICD-10-CM

## 2017-10-01 LAB — CBC WITH DIFFERENTIAL/PLATELET
BASOS PCT: 0.3 % (ref 0.0–3.0)
Basophils Absolute: 0 10*3/uL (ref 0.0–0.1)
EOS PCT: 7 % — AB (ref 0.0–5.0)
Eosinophils Absolute: 0.7 10*3/uL (ref 0.0–0.7)
HEMATOCRIT: 41.1 % (ref 39.0–52.0)
Hemoglobin: 14.8 g/dL (ref 13.0–17.0)
LYMPHS ABS: 2.8 10*3/uL (ref 0.7–4.0)
LYMPHS PCT: 29.5 % (ref 12.0–46.0)
MCHC: 35.9 g/dL (ref 30.0–36.0)
MCV: 96.4 fl (ref 78.0–100.0)
MONOS PCT: 10.4 % (ref 3.0–12.0)
Monocytes Absolute: 1 10*3/uL (ref 0.1–1.0)
NEUTROS ABS: 5 10*3/uL (ref 1.4–7.7)
NEUTROS PCT: 52.8 % (ref 43.0–77.0)
PLATELETS: 273 10*3/uL (ref 150.0–400.0)
RBC: 4.27 Mil/uL (ref 4.22–5.81)
RDW: 12.6 % (ref 11.5–15.5)
WBC: 9.5 10*3/uL (ref 4.0–10.5)

## 2017-10-01 LAB — COMPREHENSIVE METABOLIC PANEL
ALT: 27 U/L (ref 0–53)
AST: 22 U/L (ref 0–37)
Albumin: 4.2 g/dL (ref 3.5–5.2)
Alkaline Phosphatase: 49 U/L (ref 39–117)
BUN: 13 mg/dL (ref 6–23)
CALCIUM: 9.6 mg/dL (ref 8.4–10.5)
CHLORIDE: 88 meq/L — AB (ref 96–112)
CO2: 28 meq/L (ref 19–32)
CREATININE: 0.82 mg/dL (ref 0.40–1.50)
GFR: 95.65 mL/min (ref >60.00)
Glucose, Bld: 114 mg/dL — ABNORMAL HIGH (ref 70–99)
Potassium: 3.8 mEq/L (ref 3.5–5.1)
Sodium: 124 mEq/L — ABNORMAL LOW (ref 135–145)
Total Bilirubin: 0.5 mg/dL (ref 0.2–1.2)
Total Protein: 7 g/dL (ref 6.0–8.3)

## 2017-10-01 LAB — TSH: TSH: 2.11 u[IU]/mL (ref 0.35–4.50)

## 2017-10-01 NOTE — Telephone Encounter (Signed)
Patient called and scheduled an appointment with Dr. Lenna Gilford for this morning to be seen for these issues noted in his email.

## 2017-10-01 NOTE — Patient Instructions (Addendum)
Today we updated your med list in our EPIC system...    After our discussion-- we decided to continue the LOSARTAN 100mg  one tab daily...    And NOT make the switch to METOPROLOL...  Today we checked your follow up blood work...    We will contact you w/ the results when available...   We will arrange for a CT Brain scan to be sure there are no complications from the recent fall...    I will calll you w/ this report when the scan is completed...  Tony Cruz-- BE CAREFUL, no falling allowed  Call for any questions or if we can be of service in any way...   ADDENDUM>>  We cut the chlorthalidone25 to 1/2 tab Qam, and we stopped the Fe & VitC supplements.

## 2017-10-01 NOTE — Progress Notes (Addendum)
Subjective:    Patient ID: Tony Cruz, male    DOB: 1934-02-19, 84 y.o.   MRN: [id number]  HPI 84 y/o WM here for a follow up visit... he has multiple medical problems as noted below...  ~  SEE PREV EPIC NOTES FOR OLDER DATA >>    LABS 7/14:  FLP- ok x TG=190 on diet alone;  Chems- ok x BS=114 A1c=6.6;  CBC- wnl;  TSH=1.27;  PSA=0.89;  Uric=5.9.Marland KitchenMarland Kitchen  CXR 6/15 showed normal heart size, mild tortuous Ao, pacer, clear lungs, NAD...  LABS 6/15:  FLP- wnl on diet;  Chems- wnl x BS=121, A1c=6.4;  CBC- wnl;  TSH=1.73;  VitD=49...   ~  January 03, 2015:  40mo Tony Cruz reports stable- "just aging" he says;  He brought a list of complaints/ questions today:  They are disrupted at home w/ kitchen remodel; notes his urine is darker than usual (UA is clear- asked to incr water intake); taking Naprosyn & worried about renal (Cr=0.89 & reassured); +FamHx DM & he wants another A1c (BS=114, A1c=6.0);  Neuropathy eval by [facility]- reviewed w/ pt- not a lot of pain, mild burning, friend takes Gabapentin; disturbance of taste & worried about B12, Zinc, Copper (B12=594); leg cramps in thigh area- trying mustard, Tumeric; athritis pains- stiff, swelling, esp in hands... We reviewed the following medical problems during today's office visit >>     AR> on Claritin & Flonase added for "swelling" sensation... Note 10% eos on CBC...    OSA> followed by [facility], stable on CPAP, continue same...    HBP> on ASA81, Cozaar100 & Amlod5; BP= 142/78 & he denies CP, palpit, SOB, edema; [facility] prev stopped his Hct due to dizziness......    HxSyncope, sinus node dysfunction, Pacer> followed by [facility], pacer function normally, no issues ident...    BorderlineDM, overweight> on diet alone; weight is down 3# to 198# today; Labs 6/16 showed BS=114, A1c=6.0; we reviewed diet, exercise, wt reduction...     GI- GERD, Barrett's, Divertics, IBS> on Pantoprazole40; known [place], HxBarrett's, prev Rx by [facility] & now [facility] at [facility]  w/ EGD 11/14 showing gastritis, esophagitis, gastric polyps (path=+Barretts, reflux related injury, neg HPylori); colonoscopy 11/14 showing mod divertics, & 60mm polyp in cecum (tub adenoma)...    GU- BPH> on Flomax0.4 & Proscar5; followed by [facility]=> [facility]... Asked to take meds regularly.    DJD, LBP> on Naprosyn, Tramadol, Tylenol; c/o hands and ankles stiff but he still plays golf; eval by [facility] & he released him "I've done all I can do for you"    Fall 2/15 w/ nondisplaced skull fx and subdural hematoma> treated conservatively, he had some resid HA, dizzy, etc & treated w/ Epley maneuvers- improved...    Periph neuropathy> thorough eval 2011 by [facility] & [facility]- idiopathic; 2nd opinion [facility]- min discomfort & not on specific therapy for this...    Anxious> aware, not on meds... We reviewed prob list, meds, xrays and labs> see below for updates >>   CXR 6/16 showed norm heart size, pacer, clear lungs, NAD...  LABS 6/16:  FLP- at goals on diet;  Chems- ok x BS=114, A1c=6.0;  CBC- wnl x 10%eos;  TSH=1.23;  B12=594;  UA- clear, neg protein.... PLAN>>  All questions answered, stable on current med regimen; Flonase added, call for problems...  ~  July 04, 2015:  41mo [facility] reports a good interval- feeling well & no new complaints or concerns... [facility] for hearing eval &  allergy referral to [facility] for testing due to jellow jacket stings...    Hx OSA on CPAP prev followed by [facility]; gets mask & tubing changed Q76mo via [facility], rests well 6-8H/night, using CPAP every night, wakes rested & no daytime sleep pressure or issues w/ alertness...    BP controlled on Amlod5 & Losar100, measures 118/72 today & he denies HA, CP, palpit, SOB, edema, etc; pacer checked by [facility] & stable...     Weight = 205# which is up 7# from last OV 7 we reviewed diet, exercise, wt reducing strategies...     GI & GU stable on Protonix40 & Proscar5+Flomax0.4.Marland KitchenMarland Kitchen    He has DJD, LBP,  and fell 2/15 w/ skull fx & subdural, neuropathy> he saw [facility] w/ rec for Aleve and exercise! [facility]- on [facility] 7 notes that he hurts everyday.    Derm- [facility] removed an inflamed cyst from his back... EXAM shows Afeb, VSS, O2sat=97% on RA;  HEENT- neg, mallampati2;  Chest- clear w/o w/r/r;  Heart- RR w/o m/r/g;  Abd-  Soft, nontender, neg;  Ext- neg w/o c/c/e;  Neuro- neuropathy, no other focal abn... IMP/PLAN>>  [facility] is stable, continue same meds, we plan [facility] in 80mo...   ~  [facility] 23, 2017:  3-67mo [facility] & add-on appt trequested for dizziness> pt c/o balance off, says it started 10/08/15- just awoke dizzy & thought it was prob recurrent vertigo like in 2015; he took OTC [place] & a nurse at his church demonstrated the Epley maneuvers; he improved enough to play a round of golf- walked 18 holes- but noticed he was worse again the next day. Noted that when he rolled over in bed he was very dizzy; he called [facility] and was told to call us; pt denies n/v, denies f/c/s, he has been walking w/ wife's walker for balance/support; he has mult other somatic complaints...     EXAM shows Afeb, VSS, no nystagmus, O2sat=98% on RA;  HEENT- neg, ears clear, mallampati1;  Chest- clear w/o w/r/r;  Heart- RR w/o m/r/g;  Neuro- appears intact w/o focal abn... IMP/PLAN>>  [facility] is concerned that the fall, skull fx, subdural that he had [facility] could be causing this- we can't get MRI due to cardac pacer, therefore we will check CT Head;  In the interim he will Rx w/ Antivert 25mg  tid prn and do the epley maneuvers Q4-6H; we discussed Neurology eval if symptoms persist...  ADDENDUM>>  CT Head 10/24/15 showed no acute changes- w/o infarct, hemmorhage, mass, etc; there were some chr changes w/ atrophy 7 small vessel dis, no abn enhancement, prev subdural resolved...  ~  January 23, 2016:  15mo [facility] & [facility] has recovered fully from prev dizziness episode and no further repercussions from the fall/ skull  fx/ subdural that he had in 2015;  He reports incr prob w/ arthritis- this time in his right knee, pain occurred after bowling, no obvious trauma, etc;  He saw [place] w/ arthritis, poss a sl tear he said & treated conserv w/ shot & his Naprosyn (both helped);  He reports active- golfs 3d/wk, walking some, REC to add silver sneakers!  We reviewed the following medical problems during today's office visit >>     AR> on Claritin & Flonase- note 10% eos on CBC in past; we referred him for hearing eval- he wanted [facility] lab...    OSA> prev followed by [facility]- last seen 2013, he uses [facility], new machine in 2013, stable on CPAP,  no recent download available & we will try to obtain...    HBP> on ASA81, Cozaar100 & Amlod5; BP= 114/68 & he denies CP, palpit, SOB, edema; [facility] prev stopped his Hct due to dizziness......    HxSyncope, sinus node dysfunction, Pacer> followed by [facility]- last seen 06/2015, he does telechecks, pacer function normally, no issues ident...    Hyperlipidemia>  Chol has been good but TG elev in past; on Fish Oil supplements;  FLP 6/17 showed TChol 154, TG 213, HDL 45, LDL 83 & rec better low fat diet, get wt down.    BorderlineDM, overweight> on diet alone; weight is down 4# to 209# today & he needs to do better; Labs 6/17 showed BS=102, A1c=6.1; we reviewed diet, exercise, wt reduction...     GI- GERD, Barrett's, Divertics, IBS, colon polyp> on Pantoprazole40; known [place], HxBarrett's, prev Rx by [facility] & now [facility] at [facility] w/ EGD 11/14 showing gastritis, esophagitis, gastric polyps (path=+Barretts, reflux related injury, neg HPylori) & f/u EGD planned 32yrs;  Colonoscopy 11/14 showing mod divertics, & 70mm polyp in cecum (tub adenoma)=> f/u 22yrs if eligible.    GU- BPH> on Flomax0.4 & Proscar5; followed by [facility]=> [facility], last seen 03/2015... Asked to take meds regularly.    DJD, LBP> on Naprosyn, Tramadol, Tylenol; c/o hands and ankles stiff but he still plays golf;  eval by [facility] & he released him "I've done all I can do for you"    Fall 2/15 w/ nondisplaced skull fx and subdural hematoma> treated conservatively, he had some resid HA, dizzy, etc & treated w/ Epley maneuvers- improved...    Periph neuropathy> thorough eval 2011 by [facility] & [facility]- idiopathic; 2nd opinion [facility]- min discomfort & not on specific therapy for this...    Anxious> aware, not on meds... EXAM shows Afeb, VSS, no nystagmus, O2sat=98% on RA;  HEENT- neg, ears clear, mallampati1;  Chest- clear w/o w/r/r;  Heart- RR w/o m/r/g;  Neuro- appears intact w/o focal abn...  LABS 01/23/16:  FLP- Chol ok but TG=213 & needs better diet;  Chems- wnl w/ BS=102, A1c=6.1;  CBC- wnl x 13% eos;  TSH=1.65 IMP/PLAN>>  Sly is +-stable on his current regimen- continue same, needs better diet, get wt down, etc;  We will try to get CPAP download;  [facility] in 71mo...  ~  July 15, 2016:  12mo [facility] and pulm/medical check>  Lavan reports a good interval overall, but c/o seeing blood in his stools recently; he is followed for GI by [facility]> On Protonix40 & last seen 05/2013 for EGD & Colon (see below); he recalls hx of rectal fissure in the past & we discussed collecting stool cards to check for occult blood & Rx w/ anusol-HC cream=> he will f/u w/ GI- currently overdue for f/u EGD... we reviewed the following medical problems during today's office visit >>     AR> on Claritin & Flonase OTC- note 10% eos on CBC in past; we referred him for hearing eval- he wanted [facility]...    OSA> prev followed by [facility]- last seen 2013, he uses [facility], new machine in 2013 & it is still working well he says, stable on CPAP- he says he brought in the chip to be read...    HBP> on ASA81, Cozaar100 & Amlod5; BP= 124/78 & he denies CP, palpit, SOB, edema; [facility] prev stopped his Hct due to dizziness......    NEW- mild AI & aneurysmal dilatation Asc Ao at 74mm per CTA 04/2016 w/ yearly CTA vs  MRA recommended...     HxSyncope, sinus node dysfunction, Pacer> followed by [facility]- last seen 06/2016 (note reviewed), hx syncope believed due to intermit sinus node dysfunction=> pacer placed & doing satis; he's also had vasovagal episodes...     Hyperlipidemia>  Chol has been good but TG elev in past; on Fish Oil supplements;  FLP 6/17 showed TChol 154, TG 213, HDL 45, LDL 83 & rec better low fat diet, get wt down.    BorderlineDM, overweight> on diet alone; weight is up 5# to 215# today & he needs to do better; Labs 6/17 showed BS=102, A1c=6.1; we reviewed diet, exercise, wt reduction...     GI- GERD, Barrett's, Divertics, IBS, colon polyp, hems> on Pantoprazole40; known [place], HxBarrett's, followed by [facility] w/ EGD 11/14 showing gastritis, esophagitis, gastric polyps (path=+Barretts, reflux related injury, neg HPylori) & f/u EGD planned 62yrs;  Colonoscopy 11/14 showing mod divertics, & 4mm polyp in cecum (tub adenoma)=> f/u 65yrs if eligible.    GU- BPH> on Flomax0.4 & Proscar5; followed by [facility]=> [facility], last seen 03/2015... Asked to take meds regularly... He saw their PA recently & reports that PSA was 1.0    DJD, LBP> on Naprosyn, Tramadol, Tylenol; c/o hands and ankles stiff & achilles issues but he still plays golf; eval by [employer] & he released him "I've done all I can do for you"; he saw DrCaffrey 10/17 w/ R knee pain- injected.    Fall 2/15 w/ nondisplaced skull fx and subdural hematoma> treated conservatively, he had some resid HA, dizzy, etc & treated w/ Epley maneuvers- improved...    Periph neuropathy> thorough eval 2011 by [facility] & [facility]- idiopathic; 2nd opinion [facility]- min discomfort & not on specific therapy for this...    Anxious> aware, not on meds...    ?Shingles>  He had an outbreak in the left V1 nerve distribution 03/2016, seen at [facility] & treated w/ valtrex + Doxy; he called [place] & we added Pred; he reports rash resolved, no residuals... EXAM shows Afeb, VSS, O2sat=98% on RA;  HEENT-  neg, ears clear, mallampati1;  Chest- clear w/o w/r/r;  Heart- pacer, RR w/o m/r/g;  Abd- soft, nontender, neg;  Ext- VI tr edema no c/c;  Neuro- appears intact w/o focal abn...  CT Angio Chest 05/21/16> norm heart size, Asc Ao measures 39mm=> aneurysmal, no adenopathy, clear lungs, no effusion, small right hepatic cyst, spondylosis; NOTE> screening annual CTA or MRA recommended.  2DEcho 05/14/16>  Norm LV size & function w/ EF=50-55%, no regional wall motion abn, Gr1DD, mild AI, mild AscAo dilatation, mild LA dil at 85mm... IMP/PLAN>>  Jeffie will check stool cards, use the Anusol-HC cream and follow up w/ [facility] as discussed; he needs to incr exercise & in light of his arthritis "I've got arthritis in every joint in my body" rec to use bike vs swimming etc...   ~  August 22, 2016:  60mo [facility] & add-on appt due to interval Dx w/ left leg DVT>  Pt presented to the ER 07/26/16 after twisting his ankle ~1wk prior, followed by swelling in left leg, pain, VenDoppler pos for DVT; he denies CP/ SOB/ but did have long drive recently;  Exam showed swelling left lower leg, tender calf, pulses ok;  Labs showed +Factor V Leiden heterozygote;  He was started on XARELTO 15mg Bid => 20mg  Qd... Dreden has mult medical issues as noted above- now adding left leg DVT after fall and hypercoag panel pos w/ FactorV Leiden heterozygote- on Xarelto now.Marland KitchenMarland Kitchen  EXAM shows Afeb, VSS, O2sat=98% on RA;  HEENT- neg, ears clear, mallampati1;  Chest- clear w/o w/r/r;  Heart- pacer, RR, gr1/6 AI murmur LSB w/o r/g;  Abd- soft, nontender, neg;  Ext- VI, swollen left leg, no c/c...  XRay ankle & left Tib/Fib is NEG for fx, mild calcaneal spur...  Ven Doppler Left leg 07/26/16>  Acute DVT involving the left gastroc veins  LABS 07/26/16>  Chems- wnl;  CBC- ok w/ Hg=13.1;  HYPERCOAG Panel- Factor V Leiden is POS for R506Q gene mutation heterozygote (increases thrombosis risk 4-8x) but Prothrombin gene mutation is NEG; other hypercoag tests  were neg/wnl... IMP/PLAN>>  Left leg DVT after fall & long car ride- abnormal hypercoag panel- Factor V Leiden is POS for R506Q gene mutation heterozygote (increases thrombosis risk 4-8x) but Prothrombin gene mutation is NEG; on Xarelto now & tol well; rec support hose, no salt, elev legs and gradually incr exercise while avoiding unnecessary trauma...   ~  January 13, 2017:  65mo [facility] & Kaleel is here for a CPX he says>  He reports developing a blister on the tip of his right 2nd toe- why? (he has no idea but suspect rubbing/ pressure area; he initially treated himself, then went to [facility] & given Keflex, finally went to [facility]- [facility] ("it's healing OK) and Podiatry to trim onychomycosis nails;  He remains on Xarelto20 for his prev left gastroc vein DVT & finding of Factor V heterozygote mutation; notes left calf still feels tight & exam shows decr in DP pulse bilat;  Further complicating the picture => he is Anemic w/ Hg=10.6, mcv=83, and has a signif GI history w/ GERD, 5cmHH, Barrett's esoph, Divertics, IBS, hx colon adenomas, Hems- on Protonix40/d & AnusolHC cream prn, followed by [facility] & seen 09/10/16 while doing satis (Note reviewed- last EGD & Colon was 05/2013 and they decided to discontinue surveillance, discussed poss Hem banding but on Xarelto)...  Current active/ complex issues>>        Right 2nd toe ulcer at tip>  Art dopplers are pending       Left leg DVT on Xarelto20>  f/u venous dopplers 01/22/17 showed chronic left gastroc vein DVT; this chr DVT + his hypercoag panel results means we will need to continue anticoagulation, REC to decr to Xarelto15 due to bleed.       Abn hypercoag panel w/ Factor V Leiden POS for R506Q gene mutation- heterozygote> aware       GI- GERD, Barrett's, Divertics, IBS, colon polyp, hems> on Pantoprazole40; known [place], HxBarrett's, followed by [facility] w/ EGD 11/14 showing gastritis, esophagitis, gastric polyps (path=+Barretts, reflux related injury, neg HPylori) &  Colonoscopy 11/14 showing mod divertics, & 24mm polyp in cecum (tub adenoma)=> pt seen by [facility] 09/10/16 & they opted to discontinue screening f/u...       Anemia , Iron deficient, & heme pos stools> we will start pt on FeSO4 + VitC and ask GI to recheck pt in light of his mult GI problems, need for chr anticoagulation, and heme pos stools. Medical Problem List>>     AR> on Claritin & Flonase OTC- note 10% eos on CBC in past; we referred him for hearing eval- he wanted [facility] lab...    OSA> prev followed by [facility]- last seen 2013, he uses [facility], new machine in 2013 & it is still working well he says, stable on CPAP- but we cannot get download from the chip...    HBP> on ASA81, Cozaar100 & Amlod5; BP= 122/78 &  he denies CP, palpit, SOB, edema; [facility] prev stopped his Hct due to dizziness...Marland KitchenMarland KitchenMarland Kitchen    mild AI & aneurysmal dilatation Asc Ao at 74mm per CTA 04/2016 w/ yearly CTA vs MRA recommended...    HxSyncope, sinus node dysfunction, Pacer> followed by [facility]- last seen 06/2016 (note reviewed), hx syncope believed due to intermit sinus node dysfunction=> pacer placed & doing satis; he's also had vasovagal episodes...     Hyperlipidemia>  Chol has been good but TG elev in past; on Fish Oil supplements;  FLP 6/18 showed TChol 136, TG 142, HDL 41, LDL 66 & rec to conjtinue low fat diet & get wt down.    BorderlineDM, overweight> on diet alone; weight is stable ~212# but he needs to do better; Labs 6/17 showed BS=102, A1c=6.1; we reviewed diet, exercise, wt reduction; FBS 6/18 = 119    GU- BPH> on Flomax0.4 & Proscar5; followed by [facility], last seen 03/2015... Asked to take meds regularly... He saw their PA recently & reports that PSA was 1.0    DJD, LBP> on Naprosyn, Tramadol, Tylenol; c/o hands and ankles stiff & achilles issues but he still plays golf; eval by [employer] & he released him "I've done all I can do for you"; he saw DrCaffrey 10/17 w/ R knee pain- injected.    Fall 2/15 w/  nondisplaced skull fx and subdural hematoma> treated conservatively, he had some resid HA, dizzy, etc & treated w/ Epley maneuvers- improved...    Periph neuropathy> thorough eval 2011 by [facility] & [facility]- idiopathic; 2nd opinion [facility]- min discomfort & not on specific therapy for this...    Anxious> aware, not on meds...    ?Shingles>  He had an outbreak in the left V1 nerve distribution 03/2016, seen at [facility] & treated w/ valtrex + Doxy; he called [place] & we added Pred; he reports rash resolved, no residuals... EXAM shows Afeb, VSS, O2sat=98% on RA;  HEENT- pale, neg, ears clear, mallampati1;  Chest- clear w/o w/r/r;  Heart- pacer, RR, gr1/6 AI murmur LSB w/o r/g;  Abd- soft, nontender, neg;  Ext- VI, sl swollen left leg- still feels "tighter", no c/c;  Derm- right 2nd toe wrapped & he has appt w/ podiatry later today (we did not unwrap it)...  EKG 01/13/17 showed SBrady, rate50, otherw WNL...  f/u Venous Dopplers of LES>  POS for chronic DVT in left gastroc veins...  Arterial Dopplers of LEs> pending  LABS 01/14/17>  FLP- ok all parameters at goals on FishOil;  Chems- ok x BS=119;  CBC- anemic w/ Hg=10.7, mcv=85, Eos=12%;  Stool heme pos;  Fe=36 (8%sat), Ferritin=8.2;  B12=434;  TSH=2.54 IMP/PLAN>>  As outlined above, complex series of problems that all intersect> Hx DVT 12/17 after fall w/ left ankle injury & left gastroc vein DVT on Dopplers; hypercoag panel done in ER 12/17 before anticoagulated showed that he is a Factor V Leiden heterozygote- POS for R506Q gene mutation; he has been on Xarelto anticoagulation (20mg /d) and recently found to have iron defic anemia w/ heme pos stools=> we will treat w/ FeSO4 325mg /d + VitC, decision made to decr his Xarelto to 15mg /d, and refer back to GI- [facility] regarding his mult GI diagnoses and heme pos stool... We will recheck pt in 71mo.  ADDENDUM>>  Art Dopplers of LEs done 02/11/17>  No evid of arterial dis w/ norm ABIs, norm TBIs etc...   ~  [facility] 6,  2019:  8-48mo [facility] & add-on appt requested for mult issues>  Nox presents  for an add-on appt for mult complaints:  1) he states that his neuropathy is worse- prev eval by [facility] and [employer] (church friends) years ago & more recently by [facility] (min discomfort & not requiring further eval or rx)  but when he called for f/u they said it's been >34yrs and he'd need another referral; on questioning he still has min symptoms and again doesn't seem to warrant additional eval or rx (he will call me if he wants referral).Marland KitchenMarland Kitchen  2) he says he had a vivid dream & fell OOB about 1wk ago- bruised right shoulder & thigh/buttock & bumped head w/o knot or laceration; he's had mild HA, feels tired/ sleepy and somewhat unsteady since this episode; as noted [facility] wouldn't see him w/o referral & 1st appt too far out so he is here today to get checked out & get a brain scan;  Notes daily HAs, feels pressure behind his eyes, and nasal congestion; he hasn't curtailed activities much- still plays golf (weather permitting); recall that he is on Xarelto for hx DVT, FactorV Leiden heterozygote, etc..Marland Kitchen 3) recent visitor was ill w/ fever, URI; Emileo is worried he might have caught it, sl dizzy & congested, ?rash on side of face, worried about shingles but no progression/ no blisters/ etc (doesn't look like shingles);  4) he continues to use CPAP Qhs;  5)he notes cortisone shot in right knee 1 wk ago by DrWainer et al... We reviewed his interval Epic notes/ visits >>      He saw [facility] 02/21/17>  Hx GERD w/ Barrett's, colon polyp, divertics, hems, anal fissure & referred for IDA & heme pos stool; on Xarelto for DVT, no abd pain, brb, or melena; EGD & Colon done 04/09/17>   ~EGD 04/09/17> Kerr, mult sm gastric polyps c/w benign fundic gland polyps, otherw wnl & no Barrett's...  ~Colonoscopy 04/09/17> two 4-75mm polyps in cecum, one in transverse colon (tub adenoma), one in sigmoid, mult divertics & hems...     He saw [facility]  05/21/17>  Pacemaker generator changeout- hx syncope due to sinus node dysfunction; subseq Device Clinic checks have been OK...     He saw Urology-[facility] 06/24/17>  BPH & LUTS, on Tamsulosin0.4Qod & Finasteride5 daily; droped from ~3 to <1; doing satis & PSA was 0.44; Rec same meds & f/u 75yr...    Regular [facility] f/u by [facility] 08/26/17>  No interval syncpe/ dizziness, no CP or SOB;  Device working properly; he had some edema & wt gain to 226#; they STOPPED his Amlodipine5 & added Hygroton25; f/u BMet showed Na down to 130 & Cr=0.86; he was continued on the same dose... We reviewed the following medical problems during today's office visit >>     AR, hearing loss> on Claritin & Flonase OTC- note 10% eos on CBC in past; we referred him for hearing eval- he wanted [facility] lab & now has hearing aides bilat...    OSA> prev followed by [facility]- last seen 2013, he uses [facility], new machine in 2013 & it is still working well he says, stable on CPAP & pt reports doing satis- but we cannot get download from the chip...    HBP> off Amlod5, on Cozaar100 & Hygroton25 per [facility]; BP= 126/74 & he denies CP, palpit, SOB, & edema resolved; [facility] had prev stopped Hct due to dizziness; Na down to 124 on the Hygroton25 therefore decr to 12.5/d    mild AI & aneurysmal dilatation Asc Ao at 65mm per CTA 04/2016 w/ yearly CTA  vs MRA recommended...    HxSyncope, sinus node dysfunction, Pacer> followed by [facility]- notes reviewed, hx syncope believed due to intermit sinus node dysfunction=> pacer placed & doing satis; he's also had vasovagal episodes; generator changed 10/18...     Left leg DVT on Xarelto15 now, Right 2nd toe ulcer at tip (resolved) >  f/u venous dopplers 01/22/17 showed chronic left gastroc vein DVT; this chr DVT + his hypercoag panel results means we will need to continue anticoagulation, Xarelto decr to Xarelto15 due to bleed; Art Dopplers 7/18 were wnl...    Abn hypercoag panel w/ Factor V Leiden POS for R506Q  gene mutation- heterozygote> aware, on Xarelto...    Hyperlipidemia>  Chol has been good but TG elev in past; on Fish Oil supplements;  FLP 6/18 showed TChol 136, TG 142, HDL 41, LDL 66 & rec to conjtinue low fat diet & get wt down.    BorderlineDM, overweight> on diet alone; weight is stable ~212# but he needs to do better; Labs 6/17 showed BS=102, A1c=6.1; we reviewed diet, exercise, wt reduction; FBS 6/18 =119    GI- GERD, Barrett's, Divertics, IBS, colon polyp, hems> on Pantoprazole40; known [place], HxBarrett's, followed by [facility] w/ EGD 11/14 showing gastritis, esophagitis, gastric polyps (path=+Barretts, reflux related injury, neg HPylori) & Colonoscopy 11/14 showing mod divertics, & 56mm polyp in cecum (tub adenoma)=> pt seen by [facility] w/ f/u EGD/ Colon 7/18 as above...    GU- BPH> on Flomax0.4 & Proscar5; followed by [facility]=> [facility], last seen 03/2015... Asked to take meds regularly... He saw their PA recently & reports that PSA was 1.0    DJD, LBP> on Naprosyn, Tramadol, Tylenol; c/o hands and ankles stiff & achilles issues but he still plays golf; eval by [employer] & he released him "I've done all I can do for you"; he saw DrCaffrey 10/17 w/ R knee pain- injected.    Fall 2/15 w/ nondisplaced skull fx and subdural hematoma> treated conservatively, he had some resid HA, dizzy, etc & treated w/ Epley maneuvers- improved...    Periph neuropathy> thorough eval 2011 by [facility] & [facility]- idiopathic; 2nd opinion [facility]- min discomfort & not on specific therapy for this...    Anemia , Iron deficient, & heme pos stools> started on FeSO4 + VitC and referred to  GI => EGD & Colon rechecked 9/18 w/ results above => 09/2017 Hg=14.8, Fe repleted & OK to stop Fe supplement...    Anxious> aware, not on meds...    ?Shingles>  He had an outbreak in the left V1 nerve distribution 03/2016, seen at [facility] & treated w/ valtrex + Doxy; he called [place] & we added Pred; he reports rash resolved, no  residuals... EXAM shows Afeb, VSS, O2sat=98% on RA;  HEENT- pale, neg, ears clear, mallampati1;  Chest- clear w/o w/r/r;  Heart- pacer, RR, gr1/6 AI murmur LSB w/o r/g;  Abd- soft, nontender, neg;  Ext- VI, sl swollen left leg- still feels "tighter", no c/c;  Derm- right 2nd toe wrapped & he has appt w/ podiatry later today (we did not unwrap it)...  LABS 10/01/17:  Chems- Na=124, Cr=0.82, BS=114, LFTs wnl;  CBC- Hg=14.8, mcv=96, Fe=93 (27%sat), Ferritin=64;  TSH=2.11... IMP/PLAN>>   With Na=124 I have asked him to decr Hygroton to 1/2 tab=12.5mg /d w/ recheck BMet in 2wks;  With Hg up to 14.8 & Fe normal=> ok to stop FeSO4 7 VitC supplements;  I offered to refer to Neuro but symptoms are no worse than prev & not requiring meds;  We will check CT Head w/ his fall & subseq HA=> pending;  Continue other meds the same for now...  NOTE:  >50% of this 37min [facility] was spent in counseling 7 coordination of care...  ADDENDUM>>   CT Head w/ and w/o contrast 10/06/17>. IMPRESSION: 1. Mild for age generalized atrophy that is progressed from 7. 2. Mild chronic small vessel ischemia in the cerebral white matter. 3. Small focus of left frontal gliosis where there was traumatic hemorrhage in 2015. 4. Ethmoid and sphenoid sinusitis with a chronic appearance. ADDENDUM 10/21/17>> Repeat BMet on Hygroton25- 1/2 tab Qam shows Na improved to 131, renal function normal; REC to continue 1/2 tab & watch BP...           Problem List:   OBSTRUCTIVE SLEEP APNEA (ICD-327.23) - evaluation, diagnosis, and Rx by the [facility] (their notes scanned into the EMR):  he had PSG 4/08 w/ AHI= 26, desat to 83%... placed on CPAP at 11cm H20 pressure... he also has some RLS & was on Requip transiently from the [place]. ~  4/11: pt indicates that he uses his CPAP 4/7 nights for 6-7 hr/night; he feels that he rests well, denies daytime hypersom, snoring, etc... he requests Sleep consult here in [place] (see [facility] consult). ~  5/13: he saw [facility] for  Sleep f/u> noted 10# wt gain, pt reported using CPAP compliantly w/ good daytime alertness, no issues w/ mask but requesting new machine ([facility]) as his is >63yrs old; requested to work on wt reduction... ~  6/15: his eval & initial therapy was done by the [facility]; followed by [facility] w/ good compliance & no issues w/ sleep or daytime hypersomnolence.  ALLERGIC RHINITIS (ICD-477.9) - controlled on Claritin, and OTC meds... ~  12/15: we discussed trial of Antihist, Flonase, nasal saline to help his smell & taste...   HYPERTENSION (ICD-401.9) >>  ~  on ASA 81mg /d & HCTZ 25mg /d...  ~  CXR 4/11 is clear and WNL, NAD... ~  6/12:  BP=122/82 today and he denies HA, fatigue, visual changes, CP, palipit, dizziness, syncope, dyspnea, edema, etc... ~  6/13:  BP= 142/98 & he admits intermittently elev at home; we discussed change med to LOSARTAN/ HCT 100/12.5 daily; he will monitor BP & f/u here... ~  11/13:  BP= 132/88 & similar at home; denies CP, palpit, SOB, edema... ~  6/14:  BP= 126/84 & he remains asymptomatic... ~  6/15:  on ASA81, Cozaar100 & Amlod5; BP= 132/88 & he denies CP, palpit, SOB, edema; [facility] stopped his Hct due to dizziness. ~  12/15: on ASA81, Cozaar100 & Amlod5; BP= 120/84 & he remains essentially asymptomatic... ~  6/16:  on ASA81, Cozaar100 & Amlod5; BP= 142/78 & he denies CP, palpit, SOB, edema; [facility] Cruz prev stopped his Hct due to dizziness. ~  6/17: on ASA81, Cozaar100 & Amlod5; BP= 114/68 & he denies CP, palpit, SOB, edema; [facility] prev stopped his Hct due to dizziness  Hx of SYNCOPE (ICD-780.2) & CARDIAC PACEMAKER IN SITU (ICD-V45.01) - he had pacer changed 11/08 [facility] (hosp DC Summary reviewed)... he continues regular pacer checks and f/u w/ [facility]...  ~  cath 3/99 DrJoeLeB showed norm coronaries, norm LV, norm Ao... ~  He saw [facility] 12/11 for f/u syncope secondary to sinus node dysfunction, treated w/ pacer & generator replaced 2008;  He noted intercurrent syncope that was  felt to be vasovagal in origin (this occurred in the Cards clinic 8/11 while waiting for his wife, & he has a long  hx of this from blood draws over the yrs) & [facility] Cruz reprogramed his device; EKG w/ SBrady & otherw WNL... ~  He saw [facility] Cruz for Cards/EP f/u 12/12> hx syncope related to [facility] dysfunction- devise working properly & no changes made to the programming; TSH & sed rate wetre checked and WNL... ~  Yearly f/u [facility] 12/13> doing satis w/o intercurrent syncope, pacer working satis & f/u planned 58yr... ~  12/14: he had yearly [facility] w/ [facility], doing satis & pacer OK, no changes made... ~  He continues regular f/u w/ [facility] Cruz & notes reviewed...  DIABETES MELLITUS, BORDERLINE (ICD-790.29) - he's been concerned about his BS due to [facility] and his neuropathy symptoms, but his BS has only been borderline elvated prev...  ~  labs 4/10 (wt=209#) showed BS= 113, A1c= 6.3.Marland Kitchen. rec> better diet, get wt down. ~  labs 4/11 (wt=222#) showed BS= 109, A1c= 6.2.Marland Kitchen. no meds yet, just needs to get wt down. ~  Labs 6/12 (wt=204#) showed BS= 102, A1v= 6.4 ~  Labs 6/13 (wt=213#) showed BS= 105, A1c= 6.4 ~  Labs 6/14 showed BS= 114, A1c= 6.6 ~  Labs 6/15 showed BS= 121, A1c= 6.4 ~  Labs remain under good control on diet alone...  OVERWEIGHT (ICD-278.02) - we discussed diet + exercise therapy (again)... ~  weight 4/09 = 226#.Marland KitchenMarland Kitchen 5" 9" Tall for a BMI= 33-34 ~  weight 4/10 = 209#... BMI down to 31... keep up the good work. ~  weight 4/11 = 222# .Marland KitchenMarland Kitchen BMI up to 33. ~  Weight 6/12 = 204# ~  Weight 6/13 = 213# ~  Weight 6/14 = 214# ~  Weight 6/15 = 218# ~  Weight 12/15 = 202# keep up the good work... ~  Encouraged re Diet/ Exercise/ etc...  GASTROESOPHAGEAL REFLUX DISEASE (ICD-530.81) w/ BARRETT'S ESOPH - followed by [facility] & Rx w/ OMEPRAZOLE 20mg Bid... ~  prev EGD's documented a [place] & Barrett's esoph... (last 1/03 by DrPatterson). ~  last EGD was 2/08 showing Barrett's epithelium in lower 1/3 of esoph... f/u  planned 68yrs. ~  4/11:  NOTE> f/u EGD is due, he will decide on gastroenterologist ==> he saw DrMJohnson w/ EGD 5/11 showing Irreg Z-line & bx c/w Barrett's esoph (no dysplasia), the rest of the esoph, stomach, & duodenum were WNL... ~  EGD 11/14 by [facility] showed gastritis, esophagitis, gastric polyps (path=+Barretts, reflux related injury, neg HPylori)... F/u planned 3 yrs.  DIVERTICULOSIS OF COLON (ICD-562.10)  IRRITABLE BOWEL SYNDROME (ICD-564.1) ~  last colonoscopy 2/08 by [facility] showed left sided divertics, otherw neg... f/u planned 30yrs... ~  Colonoscopy 11/14 by [facility] showed mod divertics, & 7mm polyp in cecum (tub adenoma)... F/u planned 5 yrs if eligible.  BENIGN PROSTATIC HYPERTROPHY, WITH OBSTRUCTION (ICD-600.01) - on FLOMAX 0.4mg /d & PROSCAR 5mg /d... eval and Rx by [facility] who checks pt q18mo (we don't have notes from him)... pt tells me he will be seeing DrDavis regularly when he moves to [facility]. ~  9/13: pt reports that he is now seeing [facility] at [facility] & his PSAs have all been normal; his major prob has been holding his urine & eval revealed not emptying completely- they have tried several meds to help this; he will have notes sent to [place] for [facility] records.  OSTEOARTHRITIS (ICD-715.90) >> BACK PAIN (ICD-724.5) >>  ~  managed by DrAnderson & difficult problem, thought to have an inflamm arthropathy (?variation of psoriatic arthritis) however he did not respond to NSAIDs/Celebrex, Pred, MTX, anti-TNF inhibitor; on Tramadol prn;  ~  pt saw DrBeane 6/10 for eval> neuropathy symptoms & lumbar spondylosis on XRays, Rx Mobic Prn... offered Myelogram for further eval... he also takes Calcium & Vit D. ~  He was evaluated by [facility] Cruz for Rheum => on Naprosyn, Tylenol; c/o hands and ankles stiff but he still plays golf etc; states that they released him "I've done all I can do for you, if you had RA I could help you"  FALL w/ SUBDURAL HEMATOMA >> treated conservatively, he  had some resid HA, dizzy, etc & treated w/ Epley maneuvers- improved CT Head 4/15 showed mild atrophy & sm vessel dis & scat lacunes, prev left parietal extra-axial fluid collection is resolved...   FALL w/ Left LEG TRAUMA & subsequent DVT w/ hypercoag panel showing   PERIPHERAL NEUROPATHY (ICD-356.9) - [facility] Cruz evaluated him w/ NCV's etc and dx a peripheral neuropathy... this is one of his CC> numbness, decr sensation in feet, (no pain) w/ some assoc balance problems intermittently...  he tried Mentanx but no benefit...  ~  4/11: I reviewed [facility] 11/10 note w/ the patient... ~  He also had a thorough neurologic evaluation 10/11 from one of his church physicians, DrHickling, for f/u polyneuropathy> balance issues, LBP, gait abn, variable paresthesias, foot numbness (sl decr vibration & pin prick), some subjective memory problems w/ norm MMSE, abnormal NCVs;  Prev Labs were all WNL, unrevealing;  etiology of his neuropathy is unknown= cryptogenic, and since it isn't painful he really didn't require any therapy... ~  6/13: he continues to experience prob w/ balance etc related to his neuropathy; not progressive & still not painful; also notes nocturnal leg cramps & he's tried bar of soap trick, mustard, etc- all w/o relief (we rec trial Tonic water)... ~  2015> Neuro follow up by [facility] for [facility]...  Anemia , Iron deficient, & heme pos stools> started on FeSO4 + VitC and referred to  GI => EGD & Colon rechecked 9/18 w/ results above...    10/01/17>  Fe defic anemia resolved w/ Hg=14.8, mcv=96, Fe=93 (27%sat), and Ferritin=64; OK to stop Fe supplementation...   Health Maintenance - he takes a number of Vits + lecithin, saw palmetto, Fish Oil, etc... he saw [facility] Cruz in 2010 for Derm review- pt reports nothing major found...   Past Surgical History:  Procedure Laterality Date  . APPENDECTOMY    . CHOLECYSTECTOMY    . PACEMAKER PLACEMENT    . PPM GENERATOR CHANGEOUT N/A 05/21/2017    Procedure: PPM GENERATOR CHANGEOUT;  Surgeon: Deboraha Sprang, MD;  Location: [place] [facility];  Service: Cardiovascular;  Laterality: N/A;  . skin cancer removed  12/2012   removed from his face--Dr. Sarajane Jews  . TONSILLECTOMY      Outpatient Encounter Medications as of 10/01/2017  Medication Sig  . acetaminophen (TYLENOL) 500 MG tablet Take 500-1,000 mg by mouth every 6 (six) hours as needed for headache (or pain).   . calcium carbonate (OS-CAL) 600 MG TABS Take 600 mg by mouth daily.    . chlorthalidone (HYGROTON) 25 MG tablet Take 1 tablet (25 mg total) by mouth daily.  . Cholecalciferol (VITAMIN D) 1000 UNITS capsule Take 1,000 Units by mouth daily.    . ferrous sulfate (FEOSOL) 325 (65 FE) MG tablet Take 1 tablet (325 mg total) by mouth daily with breakfast. Take with vit c 500 mg daily  . finasteride (PROSCAR) 5 MG tablet Take 1 tablet by mouth daily.  . Lecithin 1200 MG CAPS Take 1 capsule by mouth  daily.    . loratadine (CLARITIN) 10 MG tablet Take 10 mg by mouth daily.    Marland Kitchen losartan (COZAAR) 100 MG tablet Take 100 mg by mouth daily.  . Multiple Vitamin (MULTIVITAMIN) capsule Take 1 capsule by mouth daily.    . naproxen (NAPROSYN) 500 MG tablet Take 500 mg by mouth 2 (two) times daily.   . Omega-3 Fatty Acids (FISH OIL) 1200 MG CAPS Take 1,200 mg by mouth daily.  . pantoprazole (PROTONIX) 40 MG tablet Take 1 tablet (40 mg total) by mouth daily.  . Probiotic Product (PROBIOTIC PO) Take 1 capsule by mouth daily.  . Rivaroxaban (XARELTO) 15 MG TABS tablet Take 1 tablet (15 mg total) by mouth daily with supper.  . Tamsulosin HCl (FLOMAX) 0.4 MG CAPS Take 0.4 mg by mouth daily.    . traMADol (ULTRAM) 50 MG tablet Take 1 tablet (50 mg total) by mouth 3 (three) times daily.  . vitamin C (ASCORBIC ACID) 500 MG tablet Take 500 mg by mouth daily.  . [DISCONTINUED] amLODipine (NORVASC) 5 MG tablet Take 1 tablet (5 mg total) by mouth daily.  . [DISCONTINUED] metoprolol succinate (TOPROL-XL) 50  MG 24 hr tablet Take 1 tablet (50 mg total) by mouth daily. Take with or immediately following a meal.  . hydrocortisone (ANUSOL-HC) 2.5 % rectal cream Place 1 application rectally 2 (two) times daily. (Patient not taking: Reported on 10/01/2017)  . KETOTIFEN FUMARATE OP Place 1 application into both eyes at bedtime as needed (itchy/dry eyes).   Facility-Administered Encounter Medications as of 10/01/2017  Medication  . 0.9 %  sodium chloride infusion    Allergies  Allergen Reactions  . Shellfish Allergy Rash    Sensitive to shellfish    Immunization History  Administered Date(s) Administered  . 19-influenza Whole 04/09/2016  . Influenza Split 05/30/2011, 06/04/2012, 04/18/2016  . Influenza Whole 08/16/2009  . Influenza, High Dose Seasonal PF 05/12/2017  . Influenza,inj,Quad PF,6+ Mos 04/04/2014  . Influenza-Unspecified 05/29/2013, 05/04/2015  . Pneumococcal Conjugate-13 12/28/2013, 04/18/2016  . Rabies, IM 09/28/2012, 09/30/2012, 10/04/2012, 10/11/2012  . Rabies, intradermal 09/28/2012  . Tdap 06/04/2012  . Zoster 07/29/2008    Current Medications, Allergies, Past Medical History, Past Surgical History, Family History, and Social History were reviewed in [facility] record.   Review of Systems        The patient complains of sleep disorder, dyspnea on exertion, gas/bloating, joint pain, stiffness, arthritis, paresthesias, and difficulty walking.  The patient denies fever, chills, sweats, anorexia, fatigue, weakness, malaise, weight loss, blurring, diplopia, eye irritation, eye discharge, vision loss, eye pain, photophobia, earache, ear discharge, tinnitus, decreased hearing, nasal congestion, nosebleeds, sore throat, hoarseness, chest pain, palpitations, syncope, orthopnea, PND, peripheral edema, cough, dyspnea at rest, excessive sputum, hemoptysis, wheezing, pleurisy, nausea, vomiting, diarrhea, constipation, change in bowel habits, abdominal pain, melena,  hematochezia, jaundice, indigestion/heartburn, dysphagia, odynophagia, dysuria, hematuria, urinary frequency, urinary hesitancy, nocturia, incontinence, back pain, joint swelling, muscle cramps, muscle weakness, sciatica, restless legs, leg pain at night, leg pain with exertion, rash, itching, dryness, suspicious lesions, paralysis, seizures, tremors, vertigo, transient blindness, frequent falls, frequent headaches, depression, anxiety, memory loss, confusion, cold intolerance, heat intolerance, polydipsia, polyphagia, polyuria, unusual weight change, abnormal bruising, bleeding, enlarged lymph nodes, urticaria, allergic rash, hay fever, and recurrent infections.     Objective:   Physical Exam     WD, WN, 84 y/o WM in NAD... GENERAL:  Alert & oriented; pleasant & cooperative... HEENT:  [place]/AT, EOM-wnl, PERRLA, EACs-clear, TMs-wnl, NOSE-clear, THROAT-clear &  wnl. NECK:  Supple w/ fairROM; no JVD; normal carotid impulses w/o bruits; no thyromegaly or nodules palpated; no lymphadenopathy. CHEST:  Clear to P & A; without wheezes/ rales/ or rhonchi. HEART:  Regular Rhythm; without murmurs/ rubs/ or gallops; pacer in left shoulder area... ABDOMEN:  Soft & nontender; normal bowel sounds; no organomegaly or masses detected. EXT: without deformities, mild arthritic changes; no varicose veins/ +venous insuffic/ no edema. NEURO:  CN's intact;  fair gait;  Motor normal, sensory variable deficits... strength seems OK, min decr sensation in LEs. DERM:  No lesions noted; no rash etc...  RADIOLOGY DATA:  Reviewed in the EPIC EMR & discussed w/ the patient...  LABORATORY DATA:  Reviewed in the EPIC EMR & discussed w/ the patient...   Assessment & Plan:    FALL OOB w/ trauma 09/2017>   With Na=124 I have asked him to decr Hygroton to 1/2 tab=12.5mg /d w/ recheck BMet in 2wks;  With Hg up to 14.8 & Fe normal=> ok to stop FeSO4 7 VitC supplements;  I offered to refer to Neuro but symptoms are no worse than prev &  not requiring meds;  We will check CT Head w/ his fall & subseq HA=> pending;  Continue other meds the same for now...   OSA>  Prev followed by [facility] & stable on CPAP; he says he brought chip into [facility]...  HBP>  His BP is improved & stable on Cozaar & Amlodipine, continue same...  LEFT LEG DVT & ABN HYPERCOAG PANEL>  Factor V Leiden is POS for R506Q gene mutation heterozygote (increases thrombosis risk 4-8x) but Prothrombin gene mutation is NEG... 08/22/16>   Left leg DVT after fall & long carr ride- abnormal hypercoag panel- Factor V Leiden is POS for R506Q gene mutation heterozygote (increases thrombosis risk 4-8x) but Prothrombin gene mutation is NEG; on Xarelto now & tol well; rec support hose, no salt, elev legs and gradually incr exercise while avoiding unnecessary trauma.  Hx Syncope/ Pacer>  Followed by [facility] & pacer functioning normally, no further syncope or vasovagal episodes...  DM, borderline>  On diet alone & managing well, A1c remains 6.1 & we reviewed the import of wt reduction...  GERD, Hx Barrett's>  S/p EGD from [facility] 2014 w/ small segment Barrett's mucosa, no dysplasia; now c/o hematochezia (we will check stools, Rx AnusolHC, & refer back to GI).  Divertics, IBS>  Colonoscopy 11/14 w/ 56mm tub adenoma removed...  BPH>  Followed by [facility] at [facility] on [address]; biggest issue is control & he says it was from not emptying completely, they have tried several meds and continue to follow up regularly, he reports PSAs have been ok; he will get notes to [place]...  DJD>  Followed by [facility] & still having difficulty w/ pain & stiffness...  Periph Neuropathy>  Eval by Neuro, cryptogenic, no meds needed & symptoms remain the same...  Anemia , Iron deficient, & heme pos stools> started on FeSO4 + VitC and referred to  GI => EGD & Colon rechecked 9/18 w/ results above...    10/01/17>  Fe defic anemia resolved w/ Hg=14.8, mcv=96, Fe=93 (27%sat), and  Ferritin=64; OK to stop Fe supplementation...    Patient's Medications  New Prescriptions   No medications on file  Previous Medications   ACETAMINOPHEN (TYLENOL) 500 MG TABLET    Take 500-1,000 mg by mouth every 6 (six) hours as needed for headache (or pain).    CALCIUM CARBONATE (OS-CAL) 600 MG TABS  Take 600 mg by mouth daily.     CHLORTHALIDONE (HYGROTON) 25 MG TABLET    Take 1/2 tablet (12.5 mg total) by mouth daily.   CHOLECALCIFEROL (VITAMIN D) 1000 UNITS CAPSULE    Take 1,000 Units by mouth daily.      FINASTERIDE (PROSCAR) 5 MG TABLET    Take 1 tablet by mouth daily.   HYDROCORTISONE (ANUSOL-HC) 2.5 % RECTAL CREAM    Place 1 application rectally 2 (two) times daily.   KETOTIFEN FUMARATE OP    Place 1 application into both eyes at bedtime as needed (itchy/dry eyes).   LECITHIN 1200 MG CAPS    Take 1 capsule by mouth daily.     LORATADINE (CLARITIN) 10 MG TABLET    Take 10 mg by mouth daily.     LOSARTAN (COZAAR) 100 MG TABLET    Take 100 mg by mouth daily.   MULTIPLE VITAMIN (MULTIVITAMIN) CAPSULE    Take 1 capsule by mouth daily.     NAPROXEN (NAPROSYN) 500 MG TABLET    Take 500 mg by mouth 2 (two) times daily.    OMEGA-3 FATTY ACIDS (FISH OIL) 1200 MG CAPS    Take 1,200 mg by mouth daily.   PANTOPRAZOLE (PROTONIX) 40 MG TABLET    Take 1 tablet (40 mg total) by mouth daily.   PROBIOTIC PRODUCT (PROBIOTIC PO)    Take 1 capsule by mouth daily.   RIVAROXABAN (XARELTO) 15 MG TABS TABLET    Take 1 tablet (15 mg total) by mouth daily with supper.   TAMSULOSIN HCL (FLOMAX) 0.4 MG CAPS    Take 0.4 mg by mouth daily.     TRAMADOL (ULTRAM) 50 MG TABLET    Take 1 tablet (50 mg total) by mouth 3 (three) times daily.   Modified Medications   No medications on file  Discontinued Medications   AMLODIPINE (NORVASC) 5 MG TABLET    Take 1 tablet (5 mg total) by mouth daily.   METOPROLOL SUCCINATE (TOPROL-XL) 50 MG 24 HR TABLET    Take 1 tablet (50 mg total) by mouth daily. Take with or  immediately following a meal.

## 2017-10-02 ENCOUNTER — Other Ambulatory Visit: Payer: Self-pay | Admitting: Pulmonary Disease

## 2017-10-02 DIAGNOSIS — E871 Hypo-osmolality and hyponatremia: Secondary | ICD-10-CM

## 2017-10-02 LAB — IRON,TIBC AND FERRITIN PANEL
%SAT: 27 % (calc) (ref 15–60)
Ferritin: 64 ng/mL (ref 20–380)
Iron: 93 ug/dL (ref 50–180)
TIBC: 346 ug/dL (ref 250–425)

## 2017-10-06 ENCOUNTER — Ambulatory Visit (INDEPENDENT_AMBULATORY_CARE_PROVIDER_SITE_OTHER)
Admission: RE | Admit: 2017-10-06 | Discharge: 2017-10-06 | Disposition: A | Discharge status: Home / Self Care | Payer: Medicare HMO | Source: Ambulatory Visit | Attending: Pulmonary Disease | Admitting: Pulmonary Disease

## 2017-10-06 DIAGNOSIS — S0990XA Unspecified injury of head, initial encounter: Secondary | ICD-10-CM | POA: Diagnosis not present

## 2017-10-06 DIAGNOSIS — Z8679 Personal history of other diseases of the circulatory system: Secondary | ICD-10-CM | POA: Diagnosis not present

## 2017-10-06 MED ORDER — IOPAMIDOL (ISOVUE-300) INJECTION 61%
80.0000 mL | Freq: Once | INTRAVENOUS | Status: AC | PRN
  Administered 2017-10-06: 80 mL via INTRAVENOUS

## 2017-10-09 ENCOUNTER — Inpatient Hospital Stay: Admission: RE | Admit: 2017-10-09 | Payer: Medicare HMO | Source: Ambulatory Visit

## 2017-10-09 ENCOUNTER — Encounter: Payer: Self-pay | Admitting: Internal Medicine

## 2017-10-09 NOTE — Telephone Encounter (Signed)
Please let patient know I received his message  His blood counts and iron studies have normalized completely, which is very reassuring Therefore he does not need additional lab testing from my perspective at this time If he has remained on oral iron, he should do so once daily Further blood counts and iron studies can be checked by either primary care or his cardiologist as done recently

## 2017-10-16 ENCOUNTER — Telehealth: Payer: Self-pay | Admitting: Pulmonary Disease

## 2017-10-16 MED ORDER — LOSARTAN POTASSIUM 100 MG PO TABS: 100.0000 mg | ORAL_TABLET | Freq: Every day | ORAL | 3 refills | Status: DC

## 2017-10-16 NOTE — Telephone Encounter (Signed)
Rx was refilled Spoke with the pt's spouse and notified her that this was done  Nothing further needed

## 2017-10-17 NOTE — Progress Notes (Signed)
Cardiology Office Note Date:  10/22/2017  Patient ID:  Tony Cruz, DOB October 23, 1933, MRN [id number] PCP:  [name], MD  Cardiologist:  [name] Pulmonology: Dr. Lenna Gilford    Chief Complaint: planned f/u  History of Present Illness: Tony Cruz is a 84 y.o. male with history of sinus node dysfunction w/PPM, syncope thought to be vasovagal/post prandial (rate drop response was programmed on), HTN, OA, barrett's esophagus, DVT Dec 2017 and tx w/xarelto, was decreased to 15mg  though after repeat LE US showed persistent thrombus and mention of factor V Leiden was maintained on a/c.  He developed iron def anemia and +FOB, and underwent EGD/colonoscopy without cause for anemia found, and cleared to resume a/c.  He comes to the office today to be seen for [name].  He was last seen by him in Jan 2019, at that time s/p generator replacement done Nov 2018.  AT that visit he was found to be edematous, suspect 2/2 sodium intake +/- CCB and his amlodipine was stopped, chlorthalidone started.  He also recommended f/u with neurology with peripheral neurpathy and suspect syncope neurally mediated.   Planned for f/u labs and a visit in 8 weeks.  His BMET has been stable on the diuretic.  He saw PMD 10/01/17 with reports of falling OOB associated with a "vivid dream" CT head w/o acute findings.  He is doing well, reports no CP, palpitations or SOB, no dizziness, near syncope or syncope "in a very long time".  He was found hyponatremic by his PMD and his Chlorthalidone was reduced in half with improvement.  No symptoms or PND or orthopnea.  He reports his primary medical issue that he feels is his neuropathy.  He reports discussing with his PMD who did not feel he needed to see the neurologist.  He plays golf for exercise, weather permitting, often/usually they walk the course, pulling his cart, and feels like he has good exertional capacity.  He has a visit and repeat labs with his PMD planned in  June.  Device information: MDT dual chamber PPM, implanted 06/19/2007,  [name], Sick sinus Gen change 05/21/17 >>> Biotronik CLS device   Past Medical History:  Diagnosis Date  . Allergy   . Anal fissure   . Anemia   . Backache, unspecified   . Barrett's esophagus   . Diverticulosis of colon (without mention of hemorrhage)   . Esophageal reflux   . hypertension   . Hypertrophy of prostate with urinary obstruction and other lower urinary tract symptoms (LUTS)   . Irritable bowel syndrome   . Obstructive sleep apnea   . Osteoarthrosis, unspecified whether generalized or localized, unspecified site   . Other abnormal glucose   . Overweight(278.02)   . Pacemaker-MDT    Change out 2008  . Sleep apnea 2016   Uses cpap  . Syncope and collapse   . Tubular adenoma of colon   . Unspecified hereditary and idiopathic peripheral neuropathy     Past Surgical History:  Procedure Laterality Date  . APPENDECTOMY    . CHOLECYSTECTOMY    . PACEMAKER PLACEMENT    . PPM GENERATOR CHANGEOUT N/A 05/21/2017   Procedure: PPM GENERATOR CHANGEOUT;  Surgeon: Deboraha Sprang, MD;  Location: [facility];  Service: Cardiovascular;  Laterality: N/A;  . skin cancer removed  12/2012   removed from his face--Dr. Sarajane Jews  . TONSILLECTOMY      Current Outpatient Medications  Medication Sig Dispense Refill  . acetaminophen (  TYLENOL) 500 MG tablet Take 500-1,000 mg by mouth every 6 (six) hours as needed for headache (or pain).     . calcium carbonate (OS-CAL) 600 MG TABS Take 600 mg by mouth daily.      . chlorthalidone (HYGROTON) 25 MG tablet Take 1 tablet (25 mg total) by mouth daily. (Patient taking differently: Take 25 mg by mouth daily. Take 1/2 every morning) 90 tablet 3  . Cholecalciferol (VITAMIN D) 1000 UNITS capsule Take 1,000 Units by mouth daily.      . ferrous sulfate (FEOSOL) 325 (65 FE) MG tablet Take 1 tablet (325 mg total) by mouth daily with breakfast. Take with vit c 500 mg  daily 90 tablet 3  . finasteride (PROSCAR) 5 MG tablet Take 1 tablet by mouth daily.    . hydrocortisone (ANUSOL-HC) 2.5 % rectal cream Place 1 application rectally 2 (two) times daily. 30 g 6  . Lecithin 1200 MG CAPS Take 1 capsule by mouth daily.      Marland Kitchen loratadine (CLARITIN) 10 MG tablet Take 10 mg by mouth daily.      Marland Kitchen losartan (COZAAR) 100 MG tablet Take 1 tablet (100 mg total) by mouth daily. 90 tablet 3  . Multiple Vitamin (MULTIVITAMIN) capsule Take 1 capsule by mouth daily.      . naproxen (NAPROSYN) 500 MG tablet Take 500 mg by mouth 2 (two) times daily.     . Omega-3 Fatty Acids (FISH OIL) 1200 MG CAPS Take 1,200 mg by mouth daily.    . pantoprazole (PROTONIX) 40 MG tablet Take 1 tablet (40 mg total) by mouth daily. 90 tablet 4  . Probiotic Product (PROBIOTIC PO) Take 1 capsule by mouth daily.    . Rivaroxaban (XARELTO) 15 MG TABS tablet Take 1 tablet (15 mg total) by mouth daily with supper. 90 tablet 3  . Tamsulosin HCl (FLOMAX) 0.4 MG CAPS Take 0.4 mg by mouth daily.      . traMADol (ULTRAM) 50 MG tablet Take 1 tablet (50 mg total) by mouth 3 (three) times daily. 270 tablet 1  . vitamin C (ASCORBIC ACID) 500 MG tablet Take 500 mg by mouth daily.     Current Facility-Administered Medications  Medication Dose Route Frequency Provider Last Rate Last Dose  . 0.9 %  sodium chloride infusion  500 mL Intravenous Continuous Pyrtle, Lajuan Lines, MD        Allergies:   Shellfish allergy   Social History:  The patient  reports that he has never smoked. He has never used smokeless tobacco. He reports that he drinks alcohol. He reports that he does not use drugs.   Family History:  The patient's family history includes Coronary artery disease in his father and mother; Diabetes in his sister.  ROS:  Please see the history of present illness. All other systems are reviewed and otherwise negative.   PHYSICAL EXAM:  VS:  BP 124/84   Pulse 77   Ht 5\' 9"  (1.753 m)   Wt 216 lb (98 kg)   BMI  31.90 kg/m  BMI: Body mass index is 31.9 kg/m. Well nourished, well developed, in no acute distress  HEENT: normocephalic, atraumatic  Neck: no JVD, carotid bruits or masses Cardiac:  RRR; no significant murmurs, no rubs, or gallops Lungs:  CTA b/l, no wheezing, rhonchi or rales  Abd: soft, nontender, obese MS: no deformity or atrophy Ext: trace if any edema  Skin: warm and dry, no rash Neuro:  No gross deficits appreciated Psych:  euthymic mood, full affect  PPM site is stable, no tethering or discomfort   EKG:  Not done today PPM interrogation : done today and reviewed by myself: battery and lead measurements are good, PMT episodes were noted the deviec reprogrammed to eliminate.   05/14/16: ETT  Blood pressure demonstrated a normal response to exercise.  There was no ST segment deviation noted during stress. Baseline EKG demonstrates RBBB with T wave inversions in V1 and V2.  No evidence of exercise induced ischemia at an adequate HR reaching 99% MPHR.  Moderately impaired exercise tolerance. The patient exercised to 6.4 mets.  This is a low risk study.  05/14/16: TTE Study Conclusions - Left ventricle: The cavity size was normal. Wall thickness was   normal. Systolic function was normal. The estimated ejection   fraction was in the range of 50% to 55%. Wall motion was normal;   there were no regional wall motion abnormalities. Doppler   parameters are consistent with abnormal left ventricular   relaxation (grade 1 diastolic dysfunction). - Aortic valve: There was mild regurgitation. - Ascending aorta: The ascending aorta was mildly dilated. - Left atrium: The atrium was mildly dilated. Impressions: - Normal LV systolic function; grade 1 diastolic dysfunction; mild   AI; mild LAE; ascending aorta measures 4.5 cm; suggest CTA or MRA   to further assess.  10/224/18: CT chest IMPRESSION: Aneurysmal ascending aorta measuring up to 42 mm diameter. Recommend annual  imaging followup by CTA or MRA  Recent Labs: 10/01/2017: ALT 27; Hemoglobin 14.8; Platelets 273.0; TSH 2.11 10/20/2017: BUN 13; Creatinine, Ser 0.70; Potassium 4.4; Sodium 131  01/14/2017: Cholesterol 136; HDL 41.30; LDL Cholesterol 66; Total CHOL/HDL Ratio 3; Triglycerides 142.0; VLDL 28.4   Estimated Creatinine Clearance: 83.6 mL/min (by C-G formula based on SCr of 0.7 mg/dL).   Wt Readings from Last 3 Encounters:  10/22/17 216 lb (98 kg)  10/01/17 214 lb 6.4 oz (97.3 kg)  08/26/17 226 lb (102.5 kg)     Other studies reviewed: Additional studies/records reviewed today include: summarized above  ASSESSMENT AND PLAN:  1. Sick sinus, sinus node dysfunction w/PPM     Intact device function  2. Hx of vasgvagal syncope     No reports of recurrent syncope, no near syncope  3. HTN     No changes, stable    Disposition: Continue Q 3 month remotes, will see him back in 6 months, sooner if needed.  He is encouraged to continue to discuss neuropathy with his PMD and request neurology evaluation given his concerns about this.   Current medicines are reviewed at length with the patient today.  The patient did not have any concerns regarding medicines.  Haywood Lasso, PA-C 10/22/2017 9:01 AM     [facility] [address] [place] [address] [phone] (office)  [phone] (fax)

## 2017-10-20 ENCOUNTER — Other Ambulatory Visit (INDEPENDENT_AMBULATORY_CARE_PROVIDER_SITE_OTHER): Payer: Medicare HMO

## 2017-10-20 DIAGNOSIS — E871 Hypo-osmolality and hyponatremia: Secondary | ICD-10-CM

## 2017-10-20 LAB — BASIC METABOLIC PANEL
BUN: 13 mg/dL (ref 6–23)
CALCIUM: 9.2 mg/dL (ref 8.4–10.5)
CO2: 32 meq/L (ref 19–32)
Chloride: 95 mEq/L — ABNORMAL LOW (ref 96–112)
Creatinine, Ser: 0.7 mg/dL (ref 0.40–1.50)
GFR: 114.79 mL/min (ref >60.00)
GLUCOSE: 101 mg/dL — AB (ref 70–99)
Potassium: 4.4 mEq/L (ref 3.5–5.1)
Sodium: 131 mEq/L — ABNORMAL LOW (ref 135–145)

## 2017-10-22 ENCOUNTER — Ambulatory Visit: Payer: Medicare HMO | Admitting: Physician Assistant

## 2017-10-22 VITALS — BP 124/84 | HR 77 | Ht 69.0 in | Wt 216.0 lb

## 2017-10-22 DIAGNOSIS — Z950 Presence of cardiac pacemaker: Secondary | ICD-10-CM | POA: Diagnosis not present

## 2017-10-22 DIAGNOSIS — R55 Syncope and collapse: Secondary | ICD-10-CM

## 2017-10-22 DIAGNOSIS — I10 Essential (primary) hypertension: Secondary | ICD-10-CM | POA: Diagnosis not present

## 2017-10-22 DIAGNOSIS — I495 Sick sinus syndrome: Secondary | ICD-10-CM | POA: Diagnosis not present

## 2017-10-22 NOTE — Patient Instructions (Addendum)
Medication Instructions:   NONE ORDERED  TODAY   If you need a refill on your cardiac medications before your next appointment, please call your pharmacy.  Labwork: NONE ORDERED  TODAY    Testing/Procedures:  Your physician wants you to follow-up in:  [place] will receive a reminder letter in the mail two months in advance. If you don't receive a letter, please call our office to schedule the follow-up appointment.      Follow-Up:  Remote monitoring is used to monitor your Pacemaker of ICD from home. This monitoring reduces the number of office visits required to check your device to one time per year. It allows [place] to keep an eye on the functioning of your device to ensure it is working properly. You are scheduled for a device check from home on .   11-25-17..You may send your transmission at any time that day. If you have a wireless device, the transmission will be sent automatically. After your physician reviews your transmission, you will receive a postcard with your next transmission date.    Any Other Special Instructions Will Be Listed Below (If Applicable).

## 2017-10-28 ENCOUNTER — Ambulatory Visit: Payer: Medicare HMO | Admitting: Pulmonary Disease

## 2017-11-21 ENCOUNTER — Other Ambulatory Visit: Payer: Self-pay | Admitting: Pulmonary Disease

## 2017-11-21 ENCOUNTER — Encounter: Payer: Self-pay | Admitting: Pulmonary Disease

## 2017-11-21 MED ORDER — DOXYCYCLINE HYCLATE 100 MG PO TABS: 100.0000 mg | ORAL_TABLET | Freq: Two times a day (BID) | ORAL | 0 refills | Status: DC

## 2017-11-21 NOTE — Telephone Encounter (Signed)
SN please advise on below pt email:  Dr. Lenna Gilford,    I found an embedded tick on my thigh last night. It likely had been there one day at the most but was "dug in".  We removed it with tweezers but wondered if there were any cautionary treatments that might ward of the possibility of limes disease? Any treatment suggestions?  Thanks,  Norina Buzzard

## 2017-11-22 ENCOUNTER — Other Ambulatory Visit: Payer: Self-pay | Admitting: Pulmonary Disease

## 2017-11-24 ENCOUNTER — Encounter: Payer: Self-pay | Admitting: Pulmonary Disease

## 2017-11-24 NOTE — Telephone Encounter (Signed)
Dr. Nadel please advise.  Thanks! 

## 2017-11-25 ENCOUNTER — Ambulatory Visit (INDEPENDENT_AMBULATORY_CARE_PROVIDER_SITE_OTHER): Payer: Medicare HMO | Admitting: *Deleted

## 2017-11-25 DIAGNOSIS — I495 Sick sinus syndrome: Secondary | ICD-10-CM | POA: Diagnosis not present

## 2017-11-25 NOTE — Progress Notes (Signed)
Remote pacemaker transmission.   

## 2017-11-26 ENCOUNTER — Encounter: Payer: Self-pay | Admitting: Cardiology

## 2017-11-26 LAB — CUP PACEART REMOTE DEVICE CHECK
Brady Statistic AP VP Percent: 1 %
Brady Statistic AP VS Percent: 41 %
Brady Statistic AS VP Percent: 0 %
Brady Statistic RA Percent Paced: 41 %
Brady Statistic RV Percent Paced: 1 %
Date Time Interrogation Session: 20190501060913
Implantable Lead Location: 753859
Implantable Lead Model: 5068
Lead Channel Impedance Value: 379 Ohm
Lead Channel Pacing Threshold Pulse Width: 0.4 ms
Lead Channel Pacing Threshold Pulse Width: 0.4 ms
Lead Channel Setting Pacing Amplitude: 2.4 V
MDC IDC LEAD IMPLANT DT: 19990330
MDC IDC LEAD IMPLANT DT: 19990330
MDC IDC LEAD LOCATION: 753860
MDC IDC MSMT BATTERY REMAINING PERCENTAGE: 95 %
MDC IDC MSMT LEADCHNL RA PACING THRESHOLD AMPLITUDE: 0.8 V
MDC IDC MSMT LEADCHNL RV IMPEDANCE VALUE: 519 Ohm
MDC IDC MSMT LEADCHNL RV PACING THRESHOLD AMPLITUDE: 0.9 V
MDC IDC PG IMPLANT DT: 20181024
MDC IDC SET LEADCHNL RV PACING AMPLITUDE: 2.4 V
MDC IDC SET LEADCHNL RV PACING PULSEWIDTH: 0.4 ms
MDC IDC STAT BRADY AS VS PERCENT: 58 %
Pulse Gen Model: 407145
Pulse Gen Serial Number: 69203177

## 2017-12-08 DIAGNOSIS — S83206A Unspecified tear of unspecified meniscus, current injury, right knee, initial encounter: Secondary | ICD-10-CM | POA: Diagnosis not present

## 2017-12-08 DIAGNOSIS — M1711 Unilateral primary osteoarthritis, right knee: Secondary | ICD-10-CM | POA: Diagnosis not present

## 2017-12-11 DIAGNOSIS — D509 Iron deficiency anemia, unspecified: Secondary | ICD-10-CM | POA: Insufficient documentation

## 2017-12-30 DIAGNOSIS — M25561 Pain in right knee: Secondary | ICD-10-CM | POA: Insufficient documentation

## 2017-12-31 ENCOUNTER — Other Ambulatory Visit: Payer: Self-pay | Admitting: Pulmonary Disease

## 2018-01-02 DIAGNOSIS — Z85828 Personal history of other malignant neoplasm of skin: Secondary | ICD-10-CM | POA: Diagnosis not present

## 2018-01-02 DIAGNOSIS — D1801 Hemangioma of skin and subcutaneous tissue: Secondary | ICD-10-CM | POA: Diagnosis not present

## 2018-01-02 DIAGNOSIS — L918 Other hypertrophic disorders of the skin: Secondary | ICD-10-CM | POA: Diagnosis not present

## 2018-01-02 DIAGNOSIS — L570 Actinic keratosis: Secondary | ICD-10-CM | POA: Diagnosis not present

## 2018-01-02 DIAGNOSIS — L821 Other seborrheic keratosis: Secondary | ICD-10-CM | POA: Diagnosis not present

## 2018-01-02 DIAGNOSIS — M25561 Pain in right knee: Secondary | ICD-10-CM | POA: Diagnosis not present

## 2018-01-07 DIAGNOSIS — M25561 Pain in right knee: Secondary | ICD-10-CM | POA: Diagnosis not present

## 2018-01-15 ENCOUNTER — Ambulatory Visit: Payer: Medicare HMO | Admitting: Pulmonary Disease

## 2018-01-15 ENCOUNTER — Encounter: Payer: Self-pay | Admitting: Pulmonary Disease

## 2018-01-15 ENCOUNTER — Other Ambulatory Visit (INDEPENDENT_AMBULATORY_CARE_PROVIDER_SITE_OTHER): Payer: Medicare HMO

## 2018-01-15 VITALS — BP 118/66 | HR 74 | Temp 97.8°F | Ht 69.0 in | Wt 214.2 lb

## 2018-01-15 DIAGNOSIS — N401 Enlarged prostate with lower urinary tract symptoms: Secondary | ICD-10-CM | POA: Diagnosis not present

## 2018-01-15 DIAGNOSIS — Z950 Presence of cardiac pacemaker: Secondary | ICD-10-CM | POA: Diagnosis not present

## 2018-01-15 DIAGNOSIS — I495 Sick sinus syndrome: Secondary | ICD-10-CM | POA: Diagnosis not present

## 2018-01-15 DIAGNOSIS — I10 Essential (primary) hypertension: Secondary | ICD-10-CM

## 2018-01-15 DIAGNOSIS — R7301 Impaired fasting glucose: Secondary | ICD-10-CM | POA: Diagnosis not present

## 2018-01-15 DIAGNOSIS — N138 Other obstructive and reflux uropathy: Secondary | ICD-10-CM

## 2018-01-15 DIAGNOSIS — G609 Hereditary and idiopathic neuropathy, unspecified: Secondary | ICD-10-CM

## 2018-01-15 DIAGNOSIS — M159 Polyosteoarthritis, unspecified: Secondary | ICD-10-CM

## 2018-01-15 DIAGNOSIS — D6851 Activated protein C resistance: Secondary | ICD-10-CM

## 2018-01-15 DIAGNOSIS — I82402 Acute embolism and thrombosis of unspecified deep veins of left lower extremity: Secondary | ICD-10-CM | POA: Diagnosis not present

## 2018-01-15 DIAGNOSIS — G4733 Obstructive sleep apnea (adult) (pediatric): Secondary | ICD-10-CM | POA: Diagnosis not present

## 2018-01-15 DIAGNOSIS — Z8781 Personal history of (healed) traumatic fracture: Secondary | ICD-10-CM

## 2018-01-15 DIAGNOSIS — M150 Primary generalized (osteo)arthritis: Secondary | ICD-10-CM

## 2018-01-15 DIAGNOSIS — Z8679 Personal history of other diseases of the circulatory system: Secondary | ICD-10-CM

## 2018-01-15 DIAGNOSIS — D509 Iron deficiency anemia, unspecified: Secondary | ICD-10-CM

## 2018-01-15 LAB — BASIC METABOLIC PANEL
BUN: 16 mg/dL (ref 6–23)
CHLORIDE: 96 meq/L (ref 96–112)
CO2: 31 meq/L (ref 19–32)
Calcium: 9.2 mg/dL (ref 8.4–10.5)
Creatinine, Ser: 0.86 mg/dL (ref 0.40–1.50)
GFR: 90.47 mL/min (ref >60.00)
GLUCOSE: 117 mg/dL — AB (ref 70–99)
POTASSIUM: 4.3 meq/L (ref 3.5–5.1)
SODIUM: 133 meq/L — AB (ref 135–145)

## 2018-01-15 LAB — LIPID PANEL
CHOLESTEROL: 165 mg/dL (ref 0–200)
HDL: 48.9 mg/dL (ref >39.00)
LDL Cholesterol: 86 mg/dL (ref 0–99)
NonHDL: 115.65
Total CHOL/HDL Ratio: 3
Triglycerides: 149 mg/dL (ref 0.0–149.0)
VLDL: 29.8 mg/dL (ref 0.0–40.0)

## 2018-01-15 LAB — CBC WITH DIFFERENTIAL/PLATELET
Basophils Absolute: 0.1 10*3/uL (ref 0.0–0.1)
Basophils Relative: 1.2 % (ref 0.0–3.0)
Eosinophils Absolute: 0.9 10*3/uL — ABNORMAL HIGH (ref 0.0–0.7)
Eosinophils Relative: 10.8 % — ABNORMAL HIGH (ref 0.0–5.0)
HCT: 42.5 % (ref 39.0–52.0)
Hemoglobin: 14.5 g/dL (ref 13.0–17.0)
LYMPHS ABS: 2.7 10*3/uL (ref 0.7–4.0)
Lymphocytes Relative: 32.3 % (ref 12.0–46.0)
MCHC: 34.1 g/dL (ref 30.0–36.0)
MCV: 97.4 fl (ref 78.0–100.0)
MONO ABS: 0.9 10*3/uL (ref 0.1–1.0)
Monocytes Relative: 10.3 % (ref 3.0–12.0)
NEUTROS ABS: 3.8 10*3/uL (ref 1.4–7.7)
NEUTROS PCT: 45.4 % (ref 43.0–77.0)
PLATELETS: 268 10*3/uL (ref 150.0–400.0)
RBC: 4.36 Mil/uL (ref 4.22–5.81)
RDW: 14 % (ref 11.5–15.5)
WBC: 8.4 10*3/uL (ref 4.0–10.5)

## 2018-01-15 NOTE — Progress Notes (Signed)
Subjective:    Patient ID: Tony Cruz, male    DOB: [birth year], 84 y.o.   MRN: [id number]  HPI 84 y/o WM here for a follow up visit... he has multiple medical problems as noted below...  ~  SEE PREV EPIC NOTES FOR OLDER DATA >>    LABS 7/14:  FLP- ok x TG=190 on diet alone;  Chems- ok x BS=114 A1c=6.6;  CBC- wnl;  TSH=1.27;  PSA=0.89;  Uric=5.9.Marland KitchenMarland Kitchen  CXR 6/15 showed normal heart size, mild tortuous Ao, pacer, clear lungs, NAD...  LABS 6/15:  FLP- wnl on diet;  Chems- wnl x BS=121, A1c=6.4;  CBC- wnl;  TSH=1.73;  VitD=49...   ~  January 03, 2015:  61mo Tony Cruz reports stable- "just aging" he says;  He brought a list of complaints/ questions today:  They are disrupted at home w/ kitchen remodel; notes his urine is darker than usual (UA is clear- asked to incr water intake); taking Naprosyn & worried about renal (Cr=0.89 & reassured); +FamHx DM & he wants another A1c (BS=114, A1c=6.0);  Neuropathy eval by DrAquino- reviewed w/ pt- not a lot of pain, mild burning, friend takes Gabapentin; disturbance of taste & worried about B12, Zinc, Copper (B12=594); leg cramps in thigh area- trying mustard, Tumeric; athritis pains- stiff, swelling, esp in hands... We reviewed the following medical problems during today's office visit >>     AR> on Claritin & Flonase added for "swelling" sensation... Note 10% eos on CBC...    OSA> followed by DrClance, stable on CPAP, continue same...    HBP> on ASA81, Cozaar100 & Amlod5; BP= 142/78 & he denies CP, palpit, SOB, edema; DrKlein prev stopped his Hct due to dizziness......    HxSyncope, sinus node dysfunction, Pacer> followed by DrKlein, pacer function normally, no issues ident...    BorderlineDM, overweight> on diet alone; weight is down 3# to 198# today; Labs 6/16 showed BS=114, A1c=6.0; we reviewed diet, exercise, wt reduction...     GI- GERD, Barrett's, Divertics, IBS> on Pantoprazole40; known [place], HxBarrett's, prev Rx by DrWeissman & now DrPyrtle at [facility]  w/ EGD 11/14 showing gastritis, esophagitis, gastric polyps (path=+Barretts, reflux related injury, neg HPylori); colonoscopy 11/14 showing mod divertics, & 64mm polyp in cecum (tub adenoma)...    GU- BPH> on Flomax0.4 & Proscar5; followed by DrRDavis=> DrEskridge... Asked to take meds regularly.    DJD, LBP> on Naprosyn, Tramadol, Tylenol; c/o hands and ankles stiff but he still plays golf; eval by Kirby Funk & he released him "I've done all I can do for you"    Fall 2/15 w/ nondisplaced skull fx and subdural hematoma> treated conservatively, he had some resid HA, dizzy, etc & treated w/ Epley maneuvers- improved...    Periph neuropathy> thorough eval 2011 by DrReynolds & Hickling- idiopathic; 2nd opinion DrAquino- min discomfort & not on specific therapy for this...    Anxious> aware, not on meds... We reviewed prob list, meds, xrays and labs> see below for updates >>   CXR 6/16 showed norm heart size, pacer, clear lungs, NAD...  LABS 6/16:  FLP- at goals on diet;  Chems- ok x BS=114, A1c=6.0;  CBC- wnl x 10%eos;  TSH=1.23;  B12=594;  UA- clear, neg protein.... PLAN>>  All questions answered, stable on current med regimen; Flonase added, call for problems...  ~  July 04, 2015:  41mo [name] a good interval- feeling well & no new complaints or concerns... He requested refer to [facility] for hearing eval &  allergy referral to [place] for testing due to jellow jacket stings...    Hx OSA on CPAP prev followed by [facility]; gets mask & tubing changed Q76mo via [employer], rests well 6-8H/night, using CPAP every night, wakes rested & no daytime sleep pressure or issues w/ alertness...    BP controlled on Amlod5 & Losar100, measures 118/72 today & he denies HA, CP, palpit, SOB, edema, etc; pacer checked by DrKlein & stable...     Weight = 205# which is up 7# from last OV 7 we reviewed diet, exercise, wt reducing strategies...     GI & GU stable on Protonix40 & Proscar5+Flomax0.4.Marland KitchenMarland Kitchen    He has DJD, LBP,  and fell 2/15 w/ skull fx & subdural, neuropathy> he saw DrNudelman w/ rec for Aleve and exercise! Kirby Funk is retiring- on [employer] 7 notes that he hurts everyday.    Derm- DrDJones removed an inflamed cyst from his back... EXAM shows Afeb, VSS, O2sat=97% on RA;  HEENT- neg, mallampati2;  Chest- clear w/o w/r/r;  Heart- RR w/o m/r/g;  Abd-  Soft, nontender, neg;  Ext- neg w/o c/c/e;  Neuro- neuropathy, no other focal abn... IMP/PLAN>>  Tony Cruz is stable, continue same meds, we plan ROV in 80mo...   ~  October 19, 2015:  [name] & add-on appt trequested for dizziness> pt c/o balance off, says it started 10/08/15- just awoke dizzy & thought it was prob recurrent vertigo like in 2015; he took OTC [place] & a nurse at his church demonstrated the Epley maneuvers; he improved enough to play a round of golf- walked 18 holes- but noticed he was worse again the next day. Noted that when he rolled over in bed he was very dizzy; he called ENT- DrShoemaker and was told to call us; pt denies n/v, denies f/c/s, he has been walking w/ wife's walker for balance/support; he has mult other somatic complaints...     EXAM shows Afeb, VSS, no nystagmus, O2sat=98% on RA;  HEENT- neg, ears clear, mallampati1;  Chest- clear w/o w/r/r;  Heart- RR w/o m/r/g;  Neuro- appears intact w/o focal abn... IMP/PLAN>>  Tony Cruz is concerned that the fall, skull fx, subdural that he had Feb2015 could be causing this- we can't get MRI due to cardac pacer, therefore we will check CT Head;  In the interim he will Rx w/ Antivert 25mg  tid prn and do the epley maneuvers Q4-6H; we discussed Neurology eval if symptoms persist...  ADDENDUM>>  CT Head 10/24/15 showed no acute changes- w/o infarct, hemmorhage, mass, etc; there were some chr changes w/ atrophy 7 small vessel dis, no abn enhancement, prev subdural resolved...  ~  January 23, 2016:  15mo ROV & Tony Cruz has recovered fully from prev dizziness episode and no further repercussions from the fall/ skull  fx/ subdural that he had in 2015;  He reports incr prob w/ arthritis- this time in his right knee, pain occurred after bowling, no obvious trauma, etc;  He saw [place] w/ arthritis, poss a sl tear he said & treated conserv w/ shot & his Naprosyn (both helped);  He reports active- golfs 3d/wk, walking some, REC to add silver sneakers!  We reviewed the following medical problems during today's office visit >>     AR> on Claritin & Flonase- note 10% eos on CBC in past; we referred him for hearing eval- he wanted [facility] lab...    OSA> prev followed by DrClance- last seen 2013, he uses [employer], new machine in 2013, stable on CPAP,  no recent download available & we will try to obtain...    HBP> on ASA81, Cozaar100 & Amlod5; BP= 114/68 & he denies CP, palpit, SOB, edema; DrKlein prev stopped his Hct due to dizziness......    HxSyncope, sinus node dysfunction, Pacer> followed by DrKlein- last seen 06/2015, he does telechecks, pacer function normally, no issues ident...    Hyperlipidemia>  Chol has been good but TG elev in past; on Fish Oil supplements;  FLP 6/17 showed TChol 154, TG 213, HDL 45, LDL 83 & rec better low fat diet, get wt down.    BorderlineDM, overweight> on diet alone; weight is down 4# to 209# today & he needs to do better; Labs 6/17 showed BS=102, A1c=6.1; we reviewed diet, exercise, wt reduction...     GI- GERD, Barrett's, Divertics, IBS, colon polyp> on Pantoprazole40; known [place], HxBarrett's, prev Rx by DrWeissman & now DrPyrtle at [facility] w/ EGD 11/14 showing gastritis, esophagitis, gastric polyps (path=+Barretts, reflux related injury, neg HPylori) & f/u EGD planned 32yrs;  Colonoscopy 11/14 showing mod divertics, & 70mm polyp in cecum (tub adenoma)=> f/u 22yrs if eligible.    GU- BPH> on Flomax0.4 & Proscar5; followed by DrRDavis=> DrEskridge, last seen 03/2015... Asked to take meds regularly.    DJD, LBP> on Naprosyn, Tramadol, Tylenol; c/o hands and ankles stiff but he still plays golf;  eval by Kirby Funk & he released him "I've done all I can do for you"    Fall 2/15 w/ nondisplaced skull fx and subdural hematoma> treated conservatively, he had some resid HA, dizzy, etc & treated w/ Epley maneuvers- improved...    Periph neuropathy> thorough eval 2011 by DrReynolds & Hickling- idiopathic; 2nd opinion DrAquino- min discomfort & not on specific therapy for this...    Anxious> aware, not on meds... EXAM shows Afeb, VSS, no nystagmus, O2sat=98% on RA;  HEENT- neg, ears clear, mallampati1;  Chest- clear w/o w/r/r;  Heart- RR w/o m/r/g;  Neuro- appears intact w/o focal abn...  LABS 01/23/16:  FLP- Chol ok but TG=213 & needs better diet;  Chems- wnl w/ BS=102, A1c=6.1;  CBC- wnl x 13% eos;  TSH=1.65 IMP/PLAN>>  Tony Cruz is +-stable on his current regimen- continue same, needs better diet, get wt down, etc;  We will try to get CPAP download;  ROV in 71mo...  ~  July 15, 2016:  12mo ROV and pulm/medical check>  L[place] reports a good interval overall, but c/o seeing blood in his stools recently; he is followed for GI by DrPyrtle> On Protonix40 & last seen 05/2013 for EGD & Colon (see below); he recalls hx of rectal fissure in the past & we discussed collecting stool cards to check for occult blood & Rx w/ anusol-HC cream=> he will f/u w/ GI- currently overdue for f/u EGD... we reviewed the following medical problems during today's office visit >>     AR> on Claritin & Flonase OTC- note 10% eos on CBC in past; we referred him for hearing eval- he wanted [facility] lab...    OSA> prev followed by DrClance- last seen 2013, he uses [employer], new machine in 2013 & it is still working well he says, stable on CPAP- he says he brought in the chip to be read...    HBP> on ASA81, Cozaar100 & Amlod5; BP= 124/78 & he denies CP, palpit, SOB, edema; DrKlein prev stopped his Hct due to dizziness......    NEW- mild AI & aneurysmal dilatation Asc Ao at 74mm per CTA 04/2016 w/ yearly CTA vs  MRA recommended...     HxSyncope, sinus node dysfunction, Pacer> followed by DrKlein- last seen 06/2016 (note reviewed), hx syncope believed due to intermit sinus node dysfunction=> pacer placed & doing satis; he's also had vasovagal episodes...     Hyperlipidemia>  Chol has been good but TG elev in past; on Fish Oil supplements;  FLP 6/17 showed TChol 154, TG 213, HDL 45, LDL 83 & rec better low fat diet, get wt down.    BorderlineDM, overweight> on diet alone; weight is up 5# to 215# today & he needs to do better; Labs 6/17 showed BS=102, A1c=6.1; we reviewed diet, exercise, wt reduction...     GI- GERD, Barrett's, Divertics, IBS, colon polyp, hems> on Pantoprazole40; known [place], HxBarrett's, followed by DrPyrtle w/ EGD 11/14 showing gastritis, esophagitis, gastric polyps (path=+Barretts, reflux related injury, neg HPylori) & f/u EGD planned 62yrs;  Colonoscopy 11/14 showing mod divertics, & 4mm polyp in cecum (tub adenoma)=> f/u 65yrs if eligible.    GU- BPH> on Flomax0.4 & Proscar5; followed by DrRDavis=> DrEskridge, last seen 03/2015... Asked to take meds regularly... He saw their PA recently & reports that PSA was 1.0    DJD, LBP> on Naprosyn, Tramadol, Tylenol; c/o hands and ankles stiff & achilles issues but he still plays golf; eval by [employer] & he released him "I've done all I can do for you"; he saw DrCaffrey 10/17 w/ R knee pain- injected.    Fall 2/15 w/ nondisplaced skull fx and subdural hematoma> treated conservatively, he had some resid HA, dizzy, etc & treated w/ Epley maneuvers- improved...    Periph neuropathy> thorough eval 2011 by DrReynolds & Hickling- idiopathic; 2nd opinion DrAquino- min discomfort & not on specific therapy for this...    Anxious> aware, not on meds...    ?Shingles>  He had an outbreak in the left V1 nerve distribution 03/2016, seen at [facility] & treated w/ valtrex + Doxy; he called [place] & we added Pred; he reports rash resolved, no residuals... EXAM shows Afeb, VSS, O2sat=98% on RA;  HEENT-  neg, ears clear, mallampati1;  Chest- clear w/o w/r/r;  Heart- pacer, RR w/o m/r/g;  Abd- soft, nontender, neg;  Ext- VI tr edema no c/c;  Neuro- appears intact w/o focal abn...  CT Angio Chest 05/21/16> norm heart size, Asc Ao measures 39mm=> aneurysmal, no adenopathy, clear lungs, no effusion, small right hepatic cyst, spondylosis; NOTE> screening annual CTA or MRA recommended.  2DEcho 05/14/16>  Norm LV size & function w/ EF=50-55%, no regional wall motion abn, Gr1DD, mild AI, mild AscAo dilatation, mild LA dil at 85mm... IMP/PLAN>>  Tony Cruz will check stool cards, use the Anusol-HC cream and follow up w/ DrPyrtle as discussed; he needs to incr exercise & in light of his arthritis "I've got arthritis in every joint in my body" rec to use bike vs swimming etc...   ~  August 22, 2016:  60mo ROV & add-on appt due to interval Dx w/ left leg DVT>  Pt presented to the ER 07/26/16 after twisting his ankle ~1wk prior, followed by swelling in left leg, pain, VenDoppler pos for DVT; he denies CP/ SOB/ but did have long drive recently;  Exam showed swelling left lower leg, tender calf, pulses ok;  Labs showed +Factor V Leiden heterozygote;  He was started on XARELTO 15mg Bid => 20mg  Qd... Tony Cruz has mult medical issues as noted above- now adding left leg DVT after fall and hypercoag panel pos w/ FactorV Leiden heterozygote- on Xarelto now.Marland KitchenMarland Kitchen  EXAM shows Afeb, VSS, O2sat=98% on RA;  HEENT- neg, ears clear, mallampati1;  Chest- clear w/o w/r/r;  Heart- pacer, RR, gr1/6 AI murmur LSB w/o r/g;  Abd- soft, nontender, neg;  Ext- VI, swollen left leg, no c/c...  XRay ankle & left Tib/Fib is NEG for fx, mild calcaneal spur...  Ven Doppler Left leg 07/26/16>  Acute DVT involving the left gastroc veins  LABS 07/26/16>  Chems- wnl;  CBC- ok w/ Hg=13.1;  HYPERCOAG Panel- Factor V Leiden is POS for R506Q gene mutation heterozygote (increases thrombosis risk 4-8x) but Prothrombin gene mutation is NEG; other hypercoag tests  were neg/wnl... IMP/PLAN>>  Left leg DVT after fall & long car ride- abnormal hypercoag panel- Factor V Leiden is POS for R506Q gene mutation heterozygote (increases thrombosis risk 4-8x) but Prothrombin gene mutation is NEG; on Xarelto now & tol well; rec support hose, no salt, elev legs and gradually incr exercise while avoiding unnecessary trauma...   ~  January 13, 2017:  65mo ROV & Tony Cruz is here for a CPX he says>  He reports developing a blister on the tip of his right 2nd toe- why? (he has no idea but suspect rubbing/ pressure area; he initially treated himself, then went to [facility] & given Keflex, finally went to [facility]- DrDJones ("it's healing OK) and Podiatry to trim onychomycosis nails;  He remains on Xarelto20 for his prev left gastroc vein DVT & finding of Factor V heterozygote mutation; notes left calf still feels tight & exam shows decr in DP pulse bilat;  Further complicating the picture => he is Anemic w/ Hg=10.6, mcv=83, and has a signif GI history w/ GERD, 5cmHH, Barrett's esoph, Divertics, IBS, hx colon adenomas, Hems- on Protonix40/d & AnusolHC cream prn, followed by DrPyrtle & seen 09/10/16 while doing satis (Note reviewed- last EGD & Colon was 05/2013 and they decided to discontinue surveillance, discussed poss Hem banding but on Xarelto)...  Current active/ complex issues>>        Right 2nd toe ulcer at tip>  Art dopplers are pending       Left leg DVT on Xarelto20>  f/u venous dopplers 01/22/17 showed chronic left gastroc vein DVT; this chr DVT + his hypercoag panel results means we will need to continue anticoagulation, REC to decr to Xarelto15 due to bleed.       Abn hypercoag panel w/ Factor V Leiden POS for R506Q gene mutation- heterozygote> aware       GI- GERD, Barrett's, Divertics, IBS, colon polyp, hems> on Pantoprazole40; known [place], HxBarrett's, followed by DrPyrtle w/ EGD 11/14 showing gastritis, esophagitis, gastric polyps (path=+Barretts, reflux related injury, neg HPylori) &  Colonoscopy 11/14 showing mod divertics, & 24mm polyp in cecum (tub adenoma)=> pt seen by DrPyrtle 09/10/16 & they opted to discontinue screening f/u...       Anemia , Iron deficient, & heme pos stools> we will start pt on FeSO4 + VitC and ask GI to recheck pt in light of his mult GI problems, need for chr anticoagulation, and heme pos stools. Medical Problem List>>     AR> on Claritin & Flonase OTC- note 10% eos on CBC in past; we referred him for hearing eval- he wanted [facility] lab...    OSA> prev followed by DrClance- last seen 2013, he uses [employer], new machine in 2013 & it is still working well he says, stable on CPAP- but we cannot get download from the chip...    HBP> on ASA81, Cozaar100 & Amlod5; BP= 122/78 &  he denies CP, palpit, SOB, edema; DrKlein prev stopped his Hct due to dizziness...Marland KitchenMarland KitchenMarland Kitchen    mild AI & aneurysmal dilatation Asc Ao at 95mm per CTA 04/2016 w/ yearly CTA vs MRA recommended...    HxSyncope, sinus node dysfunction, Pacer> followed by DrKlein- last seen 06/2016 (note reviewed), hx syncope believed due to intermit sinus node dysfunction=> pacer placed & doing satis; he's also had vasovagal episodes...     Hyperlipidemia>  Chol has been good but TG elev in past; on Fish Oil supplements;  FLP 6/18 showed TChol 136, TG 142, HDL 41, LDL 66 & rec to conjtinue low fat diet & get wt down.    BorderlineDM, overweight> on diet alone; weight is stable ~212# but he needs to do better; Labs 6/17 showed BS=102, A1c=6.1; we reviewed diet, exercise, wt reduction; FBS 6/18 = 119    GU- BPH> on Flomax0.4 & Proscar5; followed by DrRDavis=> DrEskridge, last seen 03/2015... Asked to take meds regularly... He saw their PA recently & reports that PSA was 1.0    DJD, LBP> on Naprosyn, Tramadol, Tylenol; c/o hands and ankles stiff & achilles issues but he still plays golf; eval by [employer] & he released him "I've done all I can do for you"; he saw DrCaffrey 10/17 w/ R knee pain- injected.    Fall 2/15 w/  nondisplaced skull fx and subdural hematoma> treated conservatively, he had some resid HA, dizzy, etc & treated w/ Epley maneuvers- improved...    Periph neuropathy> thorough eval 2011 by DrReynolds & Hickling- idiopathic; 2nd opinion DrAquino- min discomfort & not on specific therapy for this...    Anxious> aware, not on meds...    ?Shingles>  He had an outbreak in the left V1 nerve distribution 03/2016, seen at [facility] & treated w/ valtrex + Doxy; he called [place] & we added Pred; he reports rash resolved, no residuals... EXAM shows Afeb, VSS, O2sat=98% on RA;  HEENT- pale, neg, ears clear, mallampati1;  Chest- clear w/o w/r/r;  Heart- pacer, RR, gr1/6 AI murmur LSB w/o r/g;  Abd- soft, nontender, neg;  Ext- VI, sl swollen left leg- still feels "tighter", no c/c;  Derm- right 2nd toe wrapped & he has appt w/ podiatry later today (we did not unwrap it)...  EKG 01/13/17 showed SBrady, rate50, otherw WNL...  f/u Venous Dopplers of LES>  POS for chronic DVT in left gastroc veins...  Arterial Dopplers of LEs> pending  LABS 01/14/17>  FLP- ok all parameters at goals on FishOil;  Chems- ok x BS=119;  CBC- anemic w/ Hg=10.7, mcv=85, Eos=12%;  Stool heme pos;  Fe=36 (8%sat), Ferritin=8.2;  B12=434;  TSH=2.54 IMP/PLAN>>  As outlined above, complex series of problems that all intersect> Hx DVT 12/17 after fall w/ left ankle injury & left gastroc vein DVT on Dopplers; hypercoag panel done in ER 12/17 before anticoagulated showed that he is a Factor V Leiden heterozygote- POS for R506Q gene mutation; he has been on Xarelto anticoagulation (20mg /d) and recently found to have iron defic anemia w/ heme pos stools=> we will treat w/ FeSO4 325mg /d + VitC, decision made to decr his Xarelto to 15mg /d, and refer back to GI- DrPyrtle regarding his mult GI diagnoses and heme pos stool... We will recheck pt in 47mo.  ADDENDUM>>  Art Dopplers of LEs done 02/11/17>  No evid of arterial dis w/ norm ABIs, norm TBIs etc...  ~  October 01, 2017:  8-70mo ROV & add-on appt requested for mult issues>  Tony Cruz presents for  an add-on appt for mult complaints:  1) he states that his neuropathy is worse- prev eval by DrReynolds and DrHickling (church friends) years ago & more recently by DrAquino (min discomfort & not requiring further eval or rx)  but when he called for f/u they said it's been >22yrs and he'd need another referral; on questioning he still has min symptoms and again doesn't seem to warrant additional eval or rx (he will call me if he wants referral).Marland KitchenMarland Kitchen  2) he says he had a vivid dream & fell OOB about 1wk ago- bruised right shoulder & thigh/buttock & bumped head w/o knot or laceration; he's had mild HA, feels tired/ sleepy and somewhat unsteady since this episode; as noted DrAquino wouldn't see him w/o referral & 1st appt too far out so he is here today to get checked out & get a brain scan;  Notes daily HAs, feels pressure behind his eyes, and nasal congestion; he hasn't curtailed activities much- still plays golf (weather permitting); recall that he is on Xarelto for hx DVT, FactorV Leiden heterozygote, etc..Marland Kitchen 3) recent visitor was ill w/ fever, URI; Tony Cruz is worried he might have caught it, sl dizzy & congested, ?rash on side of face, worried about shingles but no progression/ no blisters/ etc (doesn't look like shingles);  4) he continues to use CPAP Qhs;  5)he notes cortisone shot in right knee 1 wk ago by DrWainer et al... We reviewed his interval Epic notes/ visits >>      He saw GI-DrPyrtle 02/21/17>  Hx GERD w/ Barrett's, colon polyp, divertics, hems, anal fissure & referred for IDA & heme pos stool; on Xarelto for DVT, no abd pain, brb, or melena; EGD & Colon done 04/09/17>   ~EGD 04/09/17> [place], mult sm gastric polyps c/w benign fundic gland polyps, otherw wnl & no Barrett's...  ~Colonoscopy 04/09/17> two 4-74mm polyps in cecum, one in transverse colon (tub adenoma), one in sigmoid, mult divertics & hems...     He saw [facility]-DrKlein  05/21/17>  Pacemaker generator changeout- hx syncope due to sinus node dysfunction; subseq Device Clinic checks have been OK...     He saw Urology-DrEskridge 06/24/17>  BPH & LUTS, on Tamsulosin0.4Qod & Finasteride5 daily; droped from ~3 to <1; doing satis & PSA was 0.44; Rec same meds & f/u 47yr...    Regular [facility] f/u by DrKlein 08/26/17>  No interval syncpe/ dizziness, no CP or SOB;  Device working properly; he had some edema & wt gain to 226#; they STOPPED his Amlodipine5 & added Hygroton25; f/u BMet showed Na down to 130 & Cr=0.86; he was continued on the same dose... We reviewed the following medical problems during today's office visit >>     AR, hearing loss> on Claritin & Flonase OTC- note 10% eos on CBC in past; we referred him for hearing eval- he wanted [facility] lab & now has hearing aides bilat...    OSA> prev followed by DrClance- last seen 2013, he uses [employer], new machine in 2013 & it is still working well he says, stable on CPAP & pt reports doing satis- but we cannot get download from the chip...    HBP> off Amlod5, on Cozaar100 & Hygroton25 per DrKlein; BP= 126/74 & he denies CP, palpit, SOB, & edema resolved; DrKlein had prev stopped Hct due to dizziness; Na down to 124 on the Hygroton25 therefore decr to 12.5/d    mild AI & aneurysmal dilatation Asc Ao at 66mm per CTA 04/2016 w/ yearly CTA vs  MRA recommended...    HxSyncope, sinus node dysfunction, Pacer> followed by DrKlein- notes reviewed, hx syncope believed due to intermit sinus node dysfunction=> pacer placed & doing satis; he's also had vasovagal episodes; generator changed 10/18...     Left leg DVT on Xarelto15 now, Right 2nd toe ulcer at tip (resolved) >  f/u venous dopplers 01/22/17 showed chronic left gastroc vein DVT; this chr DVT + his hypercoag panel results means we will need to continue anticoagulation, Xarelto decr to Xarelto15 due to bleed; Art Dopplers 7/18 were wnl...    Abn hypercoag panel w/ Factor V Leiden POS for R506Q  gene mutation- heterozygote> aware, on Xarelto...    Hyperlipidemia>  Chol has been good but TG elev in past; on Fish Oil supplements;  FLP 6/18 showed TChol 136, TG 142, HDL 41, LDL 66 & rec to conjtinue low fat diet & get wt down.    BorderlineDM, overweight> on diet alone; weight is stable ~212# but he needs to do better; Labs 6/17 showed BS=102, A1c=6.1; we reviewed diet, exercise, wt reduction; FBS 6/18 =119    GI- GERD, Barrett's, Divertics, IBS, colon polyp, hems> on Pantoprazole40; known [place], HxBarrett's, followed by DrPyrtle w/ EGD 11/14 showing gastritis, esophagitis, gastric polyps (path=+Barretts, reflux related injury, neg HPylori) & Colonoscopy 11/14 showing mod divertics, & 75mm polyp in cecum (tub adenoma)=> pt seen by DrPyrtle w/ f/u EGD/ Colon 7/18 as above...    GU- BPH> on Flomax0.4 & Proscar5; followed by DrRDavis=> DrEskridge, last seen 03/2015... Asked to take meds regularly... He saw their PA recently & reports that PSA was 1.0    DJD, LBP> on Naprosyn, Tramadol, Tylenol; c/o hands and ankles stiff & achilles issues but he still plays golf; eval by [employer] & he released him "I've done all I can do for you"; he saw DrCaffrey 10/17 w/ R knee pain- injected.    Fall 2/15 w/ nondisplaced skull fx and subdural hematoma> treated conservatively, he had some resid HA, dizzy, etc & treated w/ Epley maneuvers- improved...    Periph neuropathy> thorough eval 2011 by DrReynolds & Hickling- idiopathic; 2nd opinion DrAquino- min discomfort & not on specific therapy for this...    Anemia , Iron deficient, & heme pos stools> started on FeSO4 + VitC and referred to  GI => EGD & Colon rechecked 9/18 w/ results above => 09/2017 Hg=14.8, Fe repleted & OK to stop Fe supplement...    Anxious> aware, not on meds...    ?Shingles>  He had an outbreak in the left V1 nerve distribution 03/2016, seen at [facility] & treated w/ valtrex + Doxy; he called [place] & we added Pred; he reports rash resolved, no  residuals... EXAM shows Afeb, VSS, O2sat=98% on RA;  HEENT- pale, neg, ears clear, mallampati1;  Chest- clear w/o w/r/r;  Heart- pacer, RR, gr1/6 AI murmur LSB w/o r/g;  Abd- soft, nontender, neg;  Ext- VI, sl swollen left leg- still feels "tighter", no c/c;  Derm- right 2nd toe wrapped & he has appt w/ podiatry later today (we did not unwrap it)...  LABS 10/01/17:  Chems- Na=124, Cr=0.82, BS=114, LFTs wnl;  CBC- Hg=14.8, mcv=96, Fe=93 (27%sat), Ferritin=64;  TSH=2.11... IMP/PLAN>>   With Na=124 I have asked him to decr Hygroton to 1/2 tab=12.5mg /d w/ recheck BMet in 2wks;  With Hg up to 14.8 & Fe normal=> ok to stop FeSO4 7 VitC supplements;  I offered to refer to Neuro but symptoms are no worse than prev & not requiring meds;  We will check CT Head w/ his fall & subseq HA=> pending;  Continue other meds the same for now...  NOTE:  >50% of this 74min rov was spent in counseling & coordination of care...  ADDENDUM>>   CT Head w/ and w/o contrast 10/06/17>. IMPRESSION: 1. Mild for age generalized atrophy that is progressed from 57. 2. Mild chronic small vessel ischemia in the cerebral white matter. 3. Small focus of left frontal gliosis where there was traumatic hemorrhage in 2015. 4. Ethmoid and sphenoid sinusitis with a chronic appearance. ADDENDUM 10/21/17>> Repeat BMet on Hygroton25- 1/2 tab Qam shows Na improved to 131, renal function normal; REC to continue 1/2 tab & watch BP...   ~  January 15, 2018:  88mo ROV & Cong returns- denies any problems, feeling well overall- see above OV for multiple issues...  We reviewed his interval Epic notes/ visits >>      He saw CAR[facility]- RUrsuy,PA on 10/22/17>  Hx sinus node dysfunc w/ pacer (remote pacer checks done everey few months), syncope thought to be vasovagal/ postprandial, HBP, mild AI & dil asc ao at 44mm;  DVT 06/2016 on Xarelto decr to 15mg /d (for persistent thrombus + FactorV Leiden)=> developed Fe defic anemia w/ +stool blood & EGD/Colon w/o cause for  anemia found, cleared to restart Xarelto15;  Pacer generator changed 05/2017;  Hyponatremia w/ decr Chlorthalidone to 1/2 Qam & Na improved;  They wanted him to see Neuro form his neuropathy=> well documented 2 prev evaluations by DrReynolds/ Hickling in 2011 and DrAquino in 2015 & he is encouraged to call for f/u if he feels needed...     He saw [facility]- DrCollins on 12/08/17> c/o R-knee pain, has brace & takes tramadol; exam w/ crepitus & effusion; tricompartmental arthritis & effusion & torn meniscus; they opted for conservative Rx- Pt first, consider shots/ TKR later... We reviewed the following medical problems during today's office visit >> see 10/01/17 note above    OSA> he has seen DrClance in the past, last 2013, we do not have downloads/ he does not want a new machine/ we will have him f/u w/ Pulm/ Sleep team in 2020...    HBP, hyponatremia on diuretic> off Amlod5 per Cards due to edema, on Cozaar100 & Hygroton12.5 now; BP= 118/66 & he denies CP, palpit, SOB, & edema resolved; Na improved to 133 on lower dose of Hygroton...    Cards issues as above> followed by DrKlein, see PA note 10/22/17, on above meds + Xarelto15; pt reports stable...    Lipids controlled on diet + fish oil, DM/ IFG controlled on diet + exercise...    Fall w/ skull fx & subdural, hx periph neuropathy> 2 prev neuro evals as noted above; note mild neuropathy symptoms, offered Gabapentin rx but he declines...  EXAM shows Afeb, VSS, O2sat=98% on RA;  HEENT- neg, ears clear, mallampati1;  Chest- clear w/o w/r/r;  Heart- pacer, RR, gr1/6 AI murmur LSB w/o r/g;  Abd- soft, nontender, neg;  Ext- VI, no swelling- still feels "tighter", no c/c...   LABS 01/15/18>  FLP- all parameters at goals on diet + fish oil;  Chems- ok w/ Na=133, BS=117, Cr=0.86;  CBC- wnl w/ Hg=14.5, WBC=8.4 w/ 10% eos noted... IMP/PLAN>>  Jarin is stable overall; we decreased his Chlorthalidone to 1/2 tab Qam, continue Losar100 & Xarelto15; Iron has been  repleted & he can wean off the Iron supplement; we will plan rov recheck in 5-41mo prior to my planned retirement in 07/2018.Marland KitchenMarland Kitchen  Problem List:   OBSTRUCTIVE SLEEP APNEA (ICD-327.23) - evaluation, diagnosis, and Rx by the [facility] (their notes scanned into the EMR):  he had PSG 4/08 w/ AHI= 26, desat to 83%... placed on CPAP at 11cm H20 pressure... he also has some RLS & was on Requip transiently from the [place]. ~  4/11: pt indicates that he uses his CPAP 4/7 nights for 6-7 hr/night; he feels that he rests well, denies daytime hypersom, snoring, etc... he requests Sleep consult here in [place] (see DrClance consult). ~  5/13: he saw DrClance for Sleep f/u> noted 10# wt gain, pt reported using CPAP compliantly w/ good daytime alertness, no issues w/ mask but requesting new machine ([employer]) as his is >31yrs old; requested to work on wt reduction... ~  6/15: his eval & initial therapy was done by the [facility]; followed by DrClance w/ good compliance & no issues w/ sleep or daytime hypersomnolence.  ALLERGIC RHINITIS (ICD-477.9) - controlled on Claritin, and OTC meds... ~  12/15: we discussed trial of Antihist, Flonase, nasal saline to help his smell & taste...   HYPERTENSION (ICD-401.9) >>  ~  on ASA 81mg /d & HCTZ 25mg /d...  ~  CXR 4/11 is clear and WNL, NAD... ~  6/12:  BP=122/82 today and he denies HA, fatigue, visual changes, CP, palipit, dizziness, syncope, dyspnea, edema, etc... ~  6/13:  BP= 142/98 & he admits intermittently elev at home; we discussed change med to LOSARTAN/ HCT 100/12.5 daily; he will monitor BP & f/u here... ~  11/13:  BP= 132/88 & similar at home; denies CP, palpit, SOB, edema... ~  6/14:  BP= 126/84 & he remains asymptomatic... ~  6/15:  on ASA81, Cozaar100 & Amlod5; BP= 132/88 & he denies CP, palpit, SOB, edema; DrKlein stopped his Hct due to dizziness. ~  12/15: on ASA81, Cozaar100 & Amlod5; BP= 120/84 & he remains essentially asymptomatic... ~  6/16:  on ASA81, Cozaar100 &  Amlod5; BP= 142/78 & he denies CP, palpit, SOB, edema; DrKlein prev stopped his Hct due to dizziness. ~  6/17: on ASA81, Cozaar100 & Amlod5; BP= 114/68 & he denies CP, palpit, SOB, edema; DrKlein prev stopped his Hct due to dizziness  Hx of SYNCOPE (ICD-780.2) & CARDIAC PACEMAKER IN SITU (ICD-V45.01) - he had pacer changed 11/08 DrKlein (hosp DC Summary reviewed)... he continues regular pacer checks and f/u w/ DrKlein...  ~  cath 3/99 DrJoeLeB showed norm coronaries, norm LV, norm Ao... ~  He saw DrKlein 12/11 for f/u syncope secondary to sinus node dysfunction, treated w/ pacer & generator replaced 2008;  He noted intercurrent syncope that was felt to be vasovagal in origin (this occurred in the Cards clinic 8/11 while waiting for his wife, & he has a long hx of this from blood draws over the yrs) & DrKlein reprogramed his device; EKG w/ SBrady & otherw WNL... ~  He saw DrKlein for Cards/EP f/u 12/12> hx syncope related to [facility] dysfunction- devise working properly & no changes made to the programming; TSH & sed rate wetre checked and WNL... ~  Yearly f/u DrKlein 12/13> doing satis w/o intercurrent syncope, pacer working satis & f/u planned 43yr... ~  12/14: he had yearly ROV w/ DrKlein, doing satis & pacer OK, no changes made... ~  He continues regular f/u w/ DrKlein & notes reviewed...  DIABETES MELLITUS, BORDERLINE (ICD-790.29) - he's been concerned about his BS due to [facility] and his neuropathy symptoms, but his BS has only been borderline elvated prev...  ~  labs 4/10 (wt=209#) showed BS= 113, A1c= 6.3.Marland Kitchen. rec> better diet, get wt down. ~  labs 4/11 (wt=222#) showed BS= 109, A1c= 6.2.Marland Kitchen. no meds yet, just needs to get wt down. ~  Labs 6/12 (wt=204#) showed BS= 102, A1v= 6.4 ~  Labs 6/13 (wt=213#) showed BS= 105, A1c= 6.4 ~  Labs 6/14 showed BS= 114, A1c= 6.6 ~  Labs 6/15 showed BS= 121, A1c= 6.4 ~  Labs remain under good control on diet alone...  OVERWEIGHT (ICD-278.02) - we discussed diet +  exercise therapy (again)... ~  weight 4/09 = 226#.Marland KitchenMarland Kitchen 5" 9" Tall for a BMI= 33-34 ~  weight 4/10 = 209#... BMI down to 31... keep up the good work. ~  weight 4/11 = 222# .Marland KitchenMarland Kitchen BMI up to 33. ~  Weight 6/12 = 204# ~  Weight 6/13 = 213# ~  Weight 6/14 = 214# ~  Weight 6/15 = 218# ~  Weight 12/15 = 202# keep up the good work... ~  Encouraged re Diet/ Exercise/ etc...  GASTROESOPHAGEAL REFLUX DISEASE (ICD-530.81) w/ BARRETT'S ESOPH - followed by DrWeissman & Rx w/ OMEPRAZOLE 20mg Bid... ~  prev EGD's documented a [place] & Barrett's esoph... (last 1/03 by DrPatterson). ~  last EGD was 2/08 showing Barrett's epithelium in lower 1/3 of esoph... f/u planned 21yrs. ~  4/11:  NOTE> f/u EGD is due, he will decide on gastroenterologist ==> he saw DrMJohnson w/ EGD 5/11 showing Irreg Z-line & bx c/w Barrett's esoph (no dysplasia), the rest of the esoph, stomach, & duodenum were WNL... ~  EGD 11/14 by DrPyrtle showed gastritis, esophagitis, gastric polyps (path=+Barretts, reflux related injury, neg HPylori)... F/u planned 3 yrs.  DIVERTICULOSIS OF COLON (ICD-562.10)  IRRITABLE BOWEL SYNDROME (ICD-564.1) ~  last colonoscopy 2/08 by DrWeissman showed left sided divertics, otherw neg... f/u planned 48yrs... ~  Colonoscopy 11/14 by DrPyrtle showed mod divertics, & 42mm polyp in cecum (tub adenoma)... F/u planned 5 yrs if eligible.  BENIGN PROSTATIC HYPERTROPHY, WITH OBSTRUCTION (ICD-600.01) - on FLOMAX 0.4mg /d & PROSCAR 5mg /d... eval and Rx by [facility] who checks pt q71mo (we don't have notes from him)... pt tells me he will be seeing DrDavis regularly when he moves to [name]. ~  9/13: pt reports that he is now seeing DrEskridge at [facility] & his PSAs have all been normal; his major prob has been holding his urine & eval revealed not emptying completely- they have tried several meds to help this; he will have notes sent to [place] for [facility] records.  OSTEOARTHRITIS (ICD-715.90) >> BACK PAIN (ICD-724.5) >>  ~  managed  by DrAnderson & difficult problem, thought to have an inflamm arthropathy (?variation of psoriatic arthritis) however he did not respond to NSAIDs/Celebrex, Pred, MTX, anti-TNF inhibitor; on Tramadol prn;  ~  pt saw DrBeane 6/10 for eval> neuropathy symptoms & lumbar spondylosis on XRays, Rx Mobic Prn... offered Myelogram for further eval... he also takes Calcium & Vit D. ~  He was evaluated by Kirby Funk for Rheum => on Naprosyn, Tylenol; c/o hands and ankles stiff but he still plays golf etc; states that they released him "I've done all I can do for you, if you had RA I could help you"  FALL w/ SUBDURAL HEMATOMA >> treated conservatively, he had some resid HA, dizzy, etc & treated w/ Epley maneuvers- improved CT Head 4/15 showed mild atrophy & sm vessel dis & scat lacunes, prev left parietal extra-axial fluid collection is resolved...   FALL w/ Left LEG TRAUMA & subsequent DVT w/ hypercoag  panel showing   PERIPHERAL NEUROPATHY (ICD-356.9) - DrReynolds evaluated him w/ NCV's etc and dx a peripheral neuropathy... this is one of his CC> numbness, decr sensation in feet, (no pain) w/ some assoc balance problems intermittently...  he tried Mentanx but no benefit...  ~  4/11: I reviewed DrReynolds 11/10 note w/ the patient... ~  He also had a thorough neurologic evaluation 10/11 from one of his church physicians, DrHickling, for f/u polyneuropathy> balance issues, LBP, gait abn, variable paresthesias, foot numbness (sl decr vibration & pin prick), some subjective memory problems w/ norm MMSE, abnormal NCVs;  Prev Labs were all WNL, unrevealing;  etiology of his neuropathy is unknown= cryptogenic, and since it isn't painful he really didn't require any therapy... ~  6/13: he continues to experience prob w/ balance etc related to his neuropathy; not progressive & still not painful; also notes nocturnal leg cramps & he's tried bar of soap trick, mustard, etc- all w/o relief (we rec trial Tonic water)... ~   2015> Neuro follow up by DrAquino for [facility]...  Anemia , Iron deficient, & heme pos stools> started on FeSO4 + VitC and referred to  GI => EGD & Colon rechecked 9/18 w/ results above...    10/01/17>  Fe defic anemia resolved w/ Hg=14.8, mcv=96, Fe=93 (27%sat), and Ferritin=64; OK to stop Fe supplementation...   Health Maintenance - he takes a number of Vits + lecithin, saw palmetto, Fish Oil, etc... he saw DrDJones in 2010 for Derm review- pt reports nothing major found...   Past Surgical History:  Procedure Laterality Date  . APPENDECTOMY    . CHOLECYSTECTOMY    . PACEMAKER PLACEMENT    . PPM GENERATOR CHANGEOUT N/A 05/21/2017   Procedure: PPM GENERATOR CHANGEOUT;  Surgeon: Deboraha Sprang, MD;  Location: [facility];  Service: Cardiovascular;  Laterality: N/A;  . skin cancer removed  12/2012   removed from his face--Dr. Sarajane Jews  . TONSILLECTOMY      Outpatient Encounter Medications as of 01/15/2018  Medication Sig  . acetaminophen (TYLENOL) 500 MG tablet Take 500-1,000 mg by mouth every 6 (six) hours as needed for headache (or pain).   . calcium carbonate (OS-CAL) 600 MG TABS Take 600 mg by mouth daily.    . Cholecalciferol (VITAMIN D) 1000 UNITS capsule Take 1,000 Units by mouth daily.    . ferrous sulfate (FEOSOL) 325 (65 FE) MG tablet Take 1 tablet (325 mg total) by mouth daily with breakfast. Take with vit c 500 mg daily  . finasteride (PROSCAR) 5 MG tablet Take 1 tablet by mouth daily.  . hydrocortisone (ANUSOL-HC) 2.5 % rectal cream Place 1 application rectally 2 (two) times daily.  . Lecithin 1200 MG CAPS Take 1 capsule by mouth daily.    Marland Kitchen loratadine (CLARITIN) 10 MG tablet Take 10 mg by mouth daily.    Marland Kitchen losartan (COZAAR) 100 MG tablet Take 1 tablet (100 mg total) by mouth daily.  . Multiple Vitamin (MULTIVITAMIN) capsule Take 1 capsule by mouth daily.    . naproxen (NAPROSYN) 500 MG tablet TAKE 1 TABLET TWICE A DAY  . Omega-3 Fatty Acids (FISH OIL) 1200 MG CAPS  Take 1,200 mg by mouth daily.  . pantoprazole (PROTONIX) 40 MG tablet Take 1 tablet (40 mg total) by mouth daily.  . Probiotic Product (PROBIOTIC PO) Take 1 capsule by mouth daily.  . Tamsulosin HCl (FLOMAX) 0.4 MG CAPS Take 0.4 mg by mouth daily.    . traMADol (ULTRAM) 50 MG  tablet Take 1 tablet (50 mg total) by mouth 3 (three) times daily.  . vitamin C (ASCORBIC ACID) 500 MG tablet Take 500 mg by mouth daily.  Alveda Reasons 15 MG TABS tablet TAKE 1 TABLET DAILY WITH   SUPPER  . chlorthalidone (HYGROTON) 25 MG tablet Take 1 tablet (25 mg total) by mouth daily. (Patient taking differently: Take 25 mg by mouth daily. Take 1/2 every morning)  . [DISCONTINUED] doxycycline (VIBRA-TABS) 100 MG tablet Take 1 tablet (100 mg total) by mouth 2 (two) times daily.   Facility-Administered Encounter Medications as of 01/15/2018  Medication  . 0.9 %  sodium chloride infusion    Allergies  Allergen Reactions  . Shellfish Allergy Rash    Sensitive to shellfish    Immunization History  Administered Date(s) Administered  . 19-influenza Whole 04/09/2016  . Influenza Split 05/30/2011, 06/04/2012, 04/18/2016  . Influenza Whole 08/16/2009  . Influenza, High Dose Seasonal PF 05/12/2017  . Influenza,inj,Quad PF,6+ Mos 04/04/2014  . Influenza-Unspecified 05/29/2013, 05/04/2015  . Pneumococcal Conjugate-13 12/28/2013, 04/18/2016  . Rabies, IM 09/28/2012, 09/30/2012, 10/04/2012, 10/11/2012  . Rabies, intradermal 09/28/2012  . Tdap 06/04/2012  . Zoster 07/29/2008    Current Medications, Allergies, Past Medical History, Past Surgical History, Family History, and Social History were reviewed in [employer] record.   Review of Systems        The patient complains of sleep disorder, dyspnea on exertion, gas/bloating, joint pain, stiffness, arthritis, paresthesias, and difficulty walking.  The patient denies fever, chills, sweats, anorexia, fatigue, weakness, malaise, weight loss, blurring,  diplopia, eye irritation, eye discharge, vision loss, eye pain, photophobia, earache, ear discharge, tinnitus, decreased hearing, nasal congestion, nosebleeds, sore throat, hoarseness, chest pain, palpitations, syncope, orthopnea, PND, peripheral edema, cough, dyspnea at rest, excessive sputum, hemoptysis, wheezing, pleurisy, nausea, vomiting, diarrhea, constipation, change in bowel habits, abdominal pain, melena, hematochezia, jaundice, indigestion/heartburn, dysphagia, odynophagia, dysuria, hematuria, urinary frequency, urinary hesitancy, nocturia, incontinence, back pain, joint swelling, muscle cramps, muscle weakness, sciatica, restless legs, leg pain at night, leg pain with exertion, rash, itching, dryness, suspicious lesions, paralysis, seizures, tremors, vertigo, transient blindness, frequent falls, frequent headaches, depression, anxiety, memory loss, confusion, cold intolerance, heat intolerance, polydipsia, polyphagia, polyuria, unusual weight change, abnormal bruising, bleeding, enlarged lymph nodes, urticaria, allergic rash, hay fever, and recurrent infections.     Objective:   Physical Exam     WD, WN, 84 y/o WM in NAD... GENERAL:  Alert & oriented; pleasant & cooperative... HEENT:  [place]/AT, EOM-wnl, PERRLA, EACs-clear, TMs-wnl, NOSE-clear, THROAT-clear & wnl. NECK:  Supple w/ fairROM; no JVD; normal carotid impulses w/o bruits; no thyromegaly or nodules palpated; no lymphadenopathy. CHEST:  Clear to P & A; without wheezes/ rales/ or rhonchi. HEART:  Regular Rhythm; without murmurs/ rubs/ or gallops; pacer in left shoulder area... ABDOMEN:  Soft & nontender; normal bowel sounds; no organomegaly or masses detected. EXT: without deformities, mild arthritic changes; no varicose veins/ +venous insuffic/ no edema. NEURO:  CN's intact;  fair gait;  Motor normal, sensory variable deficits... strength seems OK, min decr sensation in LEs. DERM:  No lesions noted; no rash etc...  RADIOLOGY DATA:   Reviewed in the EPIC EMR & discussed w/ the patient...  LABORATORY DATA:  Reviewed in the EPIC EMR & discussed w/ the patient...   Assessment & Plan:    FALL OOB w/ trauma 09/2017>   With Na=124 I have asked him to decr Hygroton to 1/2 tab=12.5mg /d w/ recheck BMet in 2wks;  With Hg up  to 14.8 & Fe normal=> ok to stop FeSO4 7 VitC supplements;  I offered to refer to Neuro but symptoms are no worse than prev & not requiring meds;  We will check CT Head w/ his fall & subseq HA=> pending;  Continue other meds the same for now... ~  01/15/18:  Tony Cruz is stable overall; we decreased his Chlorthalidone to 1/2 tab Qam, continue Losar100 & Xarelto15; Iron has been repleted & he can wean off the Iron supplement; we will plan rov recheck in 5-77mo prior to my planned retirement in 07/2018.   OSA>  Prev followed by DrClance & stable on CPAP; he says he brought chip into [facility]...  HBP>  His BP is improved & stable on Cozaar & Amlodipine, continue same...  LEFT LEG DVT & ABN HYPERCOAG PANEL>  Factor V Leiden is POS for R506Q gene mutation heterozygote (increases thrombosis risk 4-8x) but Prothrombin gene mutation is NEG... 08/22/16>   Left leg DVT after fall & long carr ride- abnormal hypercoag panel- Factor V Leiden is POS for R506Q gene mutation heterozygote (increases thrombosis risk 4-8x) but Prothrombin gene mutation is NEG; on Xarelto now & tol well; rec support hose, no salt, elev legs and gradually incr exercise while avoiding unnecessary trauma.  Hx Syncope/ Pacer>  Followed by DrKlein & pacer functioning normally, no further syncope or vasovagal episodes...  DM, borderline>  On diet alone & managing well, A1c remains 6.1 & we reviewed the import of wt reduction...  GERD, Hx Barrett's>  S/p EGD from DrPyrtle 2014 w/ small segment Barrett's mucosa, no dysplasia; now c/o hematochezia (we will check stools, Rx AnusolHC, & refer back to GI).  Divertics, IBS>  Colonoscopy 11/14 w/ 23mm tub adenoma  removed...  BPH>  Followed by DrEskridge at [facility] on [place]; biggest issue is control & he says it was from not emptying completely, they have tried several meds and continue to follow up regularly, he reports PSAs have been ok; he will get notes to [place]...  DJD>  Followed by Kirby Funk & still having difficulty w/ pain & stiffness...  Periph Neuropathy>  Eval by Neuro, cryptogenic, no meds needed & symptoms remain the same...  Anemia , Iron deficient, & heme pos stools> started on FeSO4 + VitC and referred to  GI => EGD & Colon rechecked 9/18 w/ results above...    10/01/17>  Fe defic anemia resolved w/ Hg=14.8, mcv=96, Fe=93 (27%sat), and Ferritin=64; OK to stop Fe supplementation...    Patient's Medications  New Prescriptions   No medications on file  Previous Medications   ACETAMINOPHEN (TYLENOL) 500 MG TABLET    Take 500-1,000 mg by mouth every 6 (six) hours as needed for headache (or pain).    CALCIUM CARBONATE (OS-CAL) 600 MG TABS    Take 600 mg by mouth daily.     CHLORTHALIDONE (HYGROTON) 25 MG TABLET    Take 1/2 tablet (12.5 mg total) by mouth daily.   CHOLECALCIFEROL (VITAMIN D) 1000 UNITS CAPSULE    Take 1,000 Units by mouth daily.      FINASTERIDE (PROSCAR) 5 MG TABLET    Take 1 tablet by mouth daily.   HYDROCORTISONE (ANUSOL-HC) 2.5 % RECTAL CREAM    Place 1 application rectally 2 (two) times daily.   KETOTIFEN FUMARATE OP    Place 1 application into both eyes at bedtime as needed (itchy/dry eyes).   LECITHIN 1200 MG CAPS    Take 1 capsule by mouth daily.  LORATADINE (CLARITIN) 10 MG TABLET    Take 10 mg by mouth daily.     LOSARTAN (COZAAR) 100 MG TABLET    Take 100 mg by mouth daily.   MULTIPLE VITAMIN (MULTIVITAMIN) CAPSULE    Take 1 capsule by mouth daily.     NAPROXEN (NAPROSYN) 500 MG TABLET    Take 500 mg by mouth 2 (two) times daily.    OMEGA-3 FATTY ACIDS (FISH OIL) 1200 MG CAPS    Take 1,200 mg by mouth daily.   PANTOPRAZOLE (PROTONIX) 40 MG  TABLET    Take 1 tablet (40 mg total) by mouth daily.   PROBIOTIC PRODUCT (PROBIOTIC PO)    Take 1 capsule by mouth daily.   RIVAROXABAN (XARELTO) 15 MG TABS TABLET    Take 1 tablet (15 mg total) by mouth daily with supper.   TAMSULOSIN HCL (FLOMAX) 0.4 MG CAPS    Take 0.4 mg by mouth daily.     TRAMADOL (ULTRAM) 50 MG TABLET    Take 1 tablet (50 mg total) by mouth 3 (three) times daily.   Modified Medications   No medications on file  Discontinued Medications   AMLODIPINE (NORVASC) 5 MG TABLET    Take 1 tablet (5 mg total) by mouth daily.   METOPROLOL SUCCINATE (TOPROL-XL) 50 MG 24 HR TABLET    Take 1 tablet (50 mg total) by mouth daily. Take with or immediately following a meal.

## 2018-01-15 NOTE — Patient Instructions (Signed)
Today we updated your med list in our EPIC system...    Continue your current medications the same...    We adjusted the Chlorthalidone dose to read 1/2 tab each AM...  Today we rechecked your FASTING blood work...    We will contact you w/ the results when available...   Keep up the good work w/ diet + exercise...  Call for any questions...  Let's plan a follow up visit in 4-35mo, sooner if needed for problems.Marland KitchenMarland Kitchen

## 2018-02-24 ENCOUNTER — Ambulatory Visit (INDEPENDENT_AMBULATORY_CARE_PROVIDER_SITE_OTHER): Payer: Medicare HMO | Admitting: *Deleted

## 2018-02-24 DIAGNOSIS — I495 Sick sinus syndrome: Secondary | ICD-10-CM | POA: Diagnosis not present

## 2018-02-24 NOTE — Progress Notes (Signed)
Remote pacemaker transmission.   

## 2018-02-25 ENCOUNTER — Encounter: Payer: Self-pay | Admitting: Cardiology

## 2018-03-02 DIAGNOSIS — R69 Illness, unspecified: Secondary | ICD-10-CM | POA: Diagnosis not present

## 2018-03-03 DIAGNOSIS — R21 Rash and other nonspecific skin eruption: Secondary | ICD-10-CM | POA: Diagnosis not present

## 2018-03-03 DIAGNOSIS — B029 Zoster without complications: Secondary | ICD-10-CM | POA: Diagnosis not present

## 2018-03-10 ENCOUNTER — Ambulatory Visit: Payer: Medicare HMO | Admitting: Sports Medicine

## 2018-03-10 ENCOUNTER — Encounter: Payer: Self-pay | Admitting: Sports Medicine

## 2018-03-10 VITALS — BP 124/80 | HR 82 | Resp 16

## 2018-03-10 DIAGNOSIS — M79674 Pain in right toe(s): Secondary | ICD-10-CM

## 2018-03-10 DIAGNOSIS — M79675 Pain in left toe(s): Secondary | ICD-10-CM

## 2018-03-10 DIAGNOSIS — B351 Tinea unguium: Secondary | ICD-10-CM

## 2018-03-10 DIAGNOSIS — G629 Polyneuropathy, unspecified: Secondary | ICD-10-CM

## 2018-03-10 DIAGNOSIS — M2041 Other hammer toe(s) (acquired), right foot: Secondary | ICD-10-CM | POA: Diagnosis not present

## 2018-03-10 DIAGNOSIS — M2042 Other hammer toe(s) (acquired), left foot: Secondary | ICD-10-CM | POA: Diagnosis not present

## 2018-03-10 DIAGNOSIS — D689 Coagulation defect, unspecified: Secondary | ICD-10-CM

## 2018-03-10 NOTE — Progress Notes (Signed)
Subjective: Tony Cruz is a 84 y.o. male patient seen today in office with complaint of mildly painful thickened and elongated toenails; unable to trim. Patient denies history of Diabetes but does admit Neuropathy, and Vascular disease on Xarelto. Patient has no other pedal complaints at this time.   Patient Active Problem List   Diagnosis Date Noted  . Pain in right knee 12/30/2017  . Anemia, iron deficiency 12/11/2017  . Toe ulcer ([place]) 01/13/2017  . Heterozygous factor V Leiden mutation ([place]) 01/13/2017  . DVT (deep venous thrombosis) ([place]) 08/22/2016  . History of skull fracture 10/19/2015  . History of subdural hematoma 10/19/2015  . Headache(784.0) 09/30/2013  . Sinoatrial node dysfunction ([facility]) 06/01/2013  . Pacemaker-MDT 07/02/2011  . Night sweats 07/02/2011  . DIZZINESS 03/22/2010  . Impaired fasting glucose 11/02/2009  . Obstructive sleep apnea 11/21/2008  . Backache 11/03/2008  . Overweight 11/02/2007  . Essential hypertension 11/02/2007  . ALLERGIC RHINITIS 11/02/2007  . GASTROESOPHAGEAL REFLUX DISEASE 11/02/2007  . Diverticulosis of large intestine 11/02/2007  . Benign prostatic hyperplasia with urinary obstruction 11/02/2007  . SYNCOPE 11/02/2007  . Hereditary and idiopathic peripheral neuropathy 10/30/2007  . IRRITABLE BOWEL SYNDROME 10/30/2007  . Osteoarthritis 10/30/2007    Current Outpatient Medications on File Prior to Visit  Medication Sig Dispense Refill  . acetaminophen (TYLENOL) 500 MG tablet Take 500-1,000 mg by mouth every 6 (six) hours as needed for headache (or pain).     . calcium carbonate (OS-CAL) 600 MG TABS Take 600 mg by mouth daily.      . Cholecalciferol (VITAMIN D) 1000 UNITS capsule Take 1,000 Units by mouth daily.      . ferrous sulfate (FEOSOL) 325 (65 FE) MG tablet Take 1 tablet (325 mg total) by mouth daily with breakfast. Take with vit c 500 mg daily 90 tablet 3  . finasteride (PROSCAR) 5 MG tablet Take 1 tablet by mouth daily.     . hydrocortisone (ANUSOL-HC) 2.5 % rectal cream Place 1 application rectally 2 (two) times daily. 30 g 6  . Lecithin 1200 MG CAPS Take 1 capsule by mouth daily.      Marland Kitchen loratadine (CLARITIN) 10 MG tablet Take 10 mg by mouth daily.      Marland Kitchen losartan (COZAAR) 100 MG tablet Take 1 tablet (100 mg total) by mouth daily. 90 tablet 3  . Multiple Vitamin (MULTIVITAMIN) capsule Take 1 capsule by mouth daily.      . naproxen (NAPROSYN) 500 MG tablet TAKE 1 TABLET TWICE A DAY 180 tablet 3  . Omega-3 Fatty Acids (FISH OIL) 1200 MG CAPS Take 1,200 mg by mouth daily.    . pantoprazole (PROTONIX) 40 MG tablet Take 1 tablet (40 mg total) by mouth daily. 90 tablet 4  . Probiotic Product (PROBIOTIC PO) Take 1 capsule by mouth daily.    . Tamsulosin HCl (FLOMAX) 0.4 MG CAPS Take 0.4 mg by mouth daily.      . traMADol (ULTRAM) 50 MG tablet Take 1 tablet (50 mg total) by mouth 3 (three) times daily. 270 tablet 1  . vitamin C (ASCORBIC ACID) 500 MG tablet Take 500 mg by mouth daily.    Alveda Reasons 15 MG TABS tablet TAKE 1 TABLET DAILY WITH   SUPPER 90 tablet 3  . chlorthalidone (HYGROTON) 25 MG tablet Take 1 tablet (25 mg total) by mouth daily. (Patient taking differently: Take 25 mg by mouth daily. Take 1/2 every morning) 90 tablet 3  . valACYclovir (VALTREX) 1000 MG tablet  Current Facility-Administered Medications on File Prior to Visit  Medication Dose Route Frequency Provider Last Rate Last Dose  . 0.9 %  sodium chloride infusion  500 mL Intravenous Continuous Pyrtle, Lajuan Lines, MD        Allergies  Allergen Reactions  . Shellfish Allergy Rash    Sensitive to shellfish    Objective: Physical Exam  General: Well developed, nourished, no acute distress, awake, alert and oriented x 3  Vascular: Dorsalis pedis artery 1/4 bilateral, Posterior tibial artery 1/4 bilateral, skin temperature warm to warm proximal to distal bilateral lower extremities, + varicosities, pedal hair present bilateral.  Neurological:  Gross sensation present via light touch bilateral.   Dermatological: Skin is warm, dry, and supple bilateral, Nails 1-10 are tender, long, thick, and discolored with mild subungal debris, no webspace macerations present bilateral, no open lesions present bilateral, no callus/corns/hyperkeratotic tissue present bilateral. No signs of infection bilateral.  Musculoskeletal:  Asymptomatic claw/hammertoes boney deformities noted bilateral. Muscular strength within normal limits without painon range of motion. No pain with calf compression bilateral.  Assessment and Plan:  Problem List Items Addressed This Visit    None    Visit Diagnoses    Pain due to onychomycosis of toenails of both feet    -  Primary   Relevant Medications   valACYclovir (VALTREX) 1000 MG tablet   Hammer toes of both feet       Neuropathy       Coagulation defect ([place])          -Examined patient.  -Discussed treatment options for painful mycotic nails. -Mechanically debrided and reduced mycotic nails with sterile nail nipper and dremel nail file without incident. -Recommend good hygiene habits for nail fungus -Patient to return in 3 months for follow up evaluation or sooner if symptoms worsen.  Landis Martins, DPM

## 2018-03-13 DIAGNOSIS — B029 Zoster without complications: Secondary | ICD-10-CM | POA: Diagnosis not present

## 2018-03-13 DIAGNOSIS — R21 Rash and other nonspecific skin eruption: Secondary | ICD-10-CM | POA: Diagnosis not present

## 2018-03-25 ENCOUNTER — Telehealth: Payer: Self-pay | Admitting: Pulmonary Disease

## 2018-03-25 ENCOUNTER — Other Ambulatory Visit: Payer: Self-pay | Admitting: *Deleted

## 2018-03-25 MED ORDER — TRAMADOL HCL 50 MG PO TABS: 50.0000 mg | ORAL_TABLET | Freq: Three times a day (TID) | ORAL | 5 refills | Status: DC

## 2018-03-25 MED ORDER — TRAMADOL HCL 50 MG PO TABS: 50.0000 mg | ORAL_TABLET | Freq: Three times a day (TID) | ORAL | 1 refills | Status: DC

## 2018-03-25 NOTE — Telephone Encounter (Signed)
Called and spoke with [facility] pharmacy tech. She stated that they needed clarification of the quantity. Quantity verified. Nothing further needed.

## 2018-03-26 ENCOUNTER — Telehealth: Payer: Self-pay | Admitting: Pulmonary Disease

## 2018-03-26 NOTE — Telephone Encounter (Signed)
Spoke with [facility]. They were needing a verbal to refill pt's Tramadol. Advised them that this prescription was called into another pharmacy on 03/25/18. Nothing further was needed.

## 2018-04-08 LAB — CUP PACEART REMOTE DEVICE CHECK
Battery Remaining Percentage: 90 %
Brady Statistic AP VP Percent: 0 %
Brady Statistic AS VP Percent: 0 %
Brady Statistic RA Percent Paced: 28 %
Brady Statistic RV Percent Paced: 0 %
Implantable Lead Implant Date: 19990330
Implantable Lead Implant Date: 19990330
Implantable Lead Location: 753859
Implantable Lead Location: 753860
Implantable Lead Model: 5068
Implantable Pulse Generator Implant Date: 20181024
Lead Channel Impedance Value: 377 Ohm
Lead Channel Impedance Value: 516 Ohm
Lead Channel Pacing Threshold Amplitude: 0.8 V
Lead Channel Pacing Threshold Amplitude: 0.9 V
Lead Channel Pacing Threshold Pulse Width: 0.4 ms
Lead Channel Setting Pacing Amplitude: 2.4 V
Lead Channel Setting Pacing Pulse Width: 0.4 ms
MDC IDC MSMT LEADCHNL RV PACING THRESHOLD PULSEWIDTH: 0.4 ms
MDC IDC PG SERIAL: 69203177
MDC IDC SESS DTM: 20190911051808
MDC IDC SET LEADCHNL RA PACING AMPLITUDE: 2.4 V
MDC IDC STAT BRADY AP VS PERCENT: 28 %
MDC IDC STAT BRADY AS VS PERCENT: 72 %

## 2018-04-22 DIAGNOSIS — G4733 Obstructive sleep apnea (adult) (pediatric): Secondary | ICD-10-CM | POA: Diagnosis not present

## 2018-04-30 ENCOUNTER — Other Ambulatory Visit: Payer: Self-pay | Admitting: Pulmonary Disease

## 2018-05-08 DIAGNOSIS — H18413 Arcus senilis, bilateral: Secondary | ICD-10-CM | POA: Diagnosis not present

## 2018-05-08 DIAGNOSIS — H25043 Posterior subcapsular polar age-related cataract, bilateral: Secondary | ICD-10-CM | POA: Diagnosis not present

## 2018-05-08 DIAGNOSIS — H40013 Open angle with borderline findings, low risk, bilateral: Secondary | ICD-10-CM | POA: Diagnosis not present

## 2018-05-08 DIAGNOSIS — H2513 Age-related nuclear cataract, bilateral: Secondary | ICD-10-CM | POA: Diagnosis not present

## 2018-05-26 ENCOUNTER — Ambulatory Visit (INDEPENDENT_AMBULATORY_CARE_PROVIDER_SITE_OTHER): Payer: Medicare HMO | Admitting: *Deleted

## 2018-05-26 DIAGNOSIS — I495 Sick sinus syndrome: Secondary | ICD-10-CM

## 2018-05-26 NOTE — Progress Notes (Signed)
Remote pacemaker transmission.   

## 2018-06-15 DIAGNOSIS — R69 Illness, unspecified: Secondary | ICD-10-CM | POA: Diagnosis not present

## 2018-06-16 ENCOUNTER — Encounter: Payer: Self-pay | Admitting: Sports Medicine

## 2018-06-16 ENCOUNTER — Ambulatory Visit: Payer: Medicare HMO | Admitting: Sports Medicine

## 2018-06-16 DIAGNOSIS — M79674 Pain in right toe(s): Secondary | ICD-10-CM

## 2018-06-16 DIAGNOSIS — M79675 Pain in left toe(s): Secondary | ICD-10-CM | POA: Diagnosis not present

## 2018-06-16 DIAGNOSIS — G629 Polyneuropathy, unspecified: Secondary | ICD-10-CM

## 2018-06-16 DIAGNOSIS — M2042 Other hammer toe(s) (acquired), left foot: Secondary | ICD-10-CM

## 2018-06-16 DIAGNOSIS — D689 Coagulation defect, unspecified: Secondary | ICD-10-CM

## 2018-06-16 DIAGNOSIS — B351 Tinea unguium: Secondary | ICD-10-CM | POA: Diagnosis not present

## 2018-06-16 DIAGNOSIS — M2041 Other hammer toe(s) (acquired), right foot: Secondary | ICD-10-CM

## 2018-06-16 NOTE — Progress Notes (Signed)
Subjective: Tony Cruz is a 84 y.o. male patient seen today in office with complaint of mildly painful thickened and elongated toenails; unable to trim. Denies any changes with meds, still on Xarelto. Patient has no other pedal complaints at this time.   Patient Active Problem List   Diagnosis Date Noted  . Pain in right knee 12/30/2017  . Anemia, iron deficiency 12/11/2017  . Toe ulcer ([place]) 01/13/2017  . Heterozygous factor V Leiden mutation ([place]) 01/13/2017  . DVT (deep venous thrombosis) ([place]) 08/22/2016  . History of skull fracture 10/19/2015  . History of subdural hematoma 10/19/2015  . Headache(784.0) 09/30/2013  . Sinoatrial node dysfunction ([facility]) 06/01/2013  . Pacemaker-MDT 07/02/2011  . Night sweats 07/02/2011  . DIZZINESS 03/22/2010  . Impaired fasting glucose 11/02/2009  . Obstructive sleep apnea 11/21/2008  . Backache 11/03/2008  . Overweight 11/02/2007  . Essential hypertension 11/02/2007  . ALLERGIC RHINITIS 11/02/2007  . GASTROESOPHAGEAL REFLUX DISEASE 11/02/2007  . Diverticulosis of large intestine 11/02/2007  . Benign prostatic hyperplasia with urinary obstruction 11/02/2007  . SYNCOPE 11/02/2007  . Hereditary and idiopathic peripheral neuropathy 10/30/2007  . IRRITABLE BOWEL SYNDROME 10/30/2007  . Osteoarthritis 10/30/2007    Current Outpatient Medications on File Prior to Visit  Medication Sig Dispense Refill  . acetaminophen (TYLENOL) 500 MG tablet Take 500-1,000 mg by mouth every 6 (six) hours as needed for headache (or pain).     . calcium carbonate (OS-CAL) 600 MG TABS Take 600 mg by mouth daily.      . chlorthalidone (HYGROTON) 25 MG tablet Take 1 tablet (25 mg total) by mouth daily. (Patient taking differently: Take 25 mg by mouth daily. Take 1/2 every morning) 90 tablet 3  . Cholecalciferol (VITAMIN D) 1000 UNITS capsule Take 1,000 Units by mouth daily.      . ferrous sulfate (FEOSOL) 325 (65 FE) MG tablet Take 1 tablet (325 mg total) by mouth  daily with breakfast. Take with vit c 500 mg daily 90 tablet 3  . finasteride (PROSCAR) 5 MG tablet Take 1 tablet by mouth daily.    . hydrocortisone (ANUSOL-HC) 2.5 % rectal cream Place 1 application rectally 2 (two) times daily. 30 g 6  . Lecithin 1200 MG CAPS Take 1 capsule by mouth daily.      Marland Kitchen loratadine (CLARITIN) 10 MG tablet Take 10 mg by mouth daily.      Marland Kitchen losartan (COZAAR) 100 MG tablet Take 1 tablet (100 mg total) by mouth daily. 90 tablet 3  . Multiple Vitamin (MULTIVITAMIN) capsule Take 1 capsule by mouth daily.      . naproxen (NAPROSYN) 500 MG tablet TAKE 1 TABLET TWICE A DAY 180 tablet 3  . Omega-3 Fatty Acids (FISH OIL) 1200 MG CAPS Take 1,200 mg by mouth daily.    . pantoprazole (PROTONIX) 40 MG tablet TAKE ONE TABLET DAILY. MAKEAN APPOINTMENT FOR FURTHER REFILLS 90 tablet 3  . Probiotic Product (PROBIOTIC PO) Take 1 capsule by mouth daily.    . Tamsulosin HCl (FLOMAX) 0.4 MG CAPS Take 0.4 mg by mouth daily.      . traMADol (ULTRAM) 50 MG tablet Take 1 tablet (50 mg total) by mouth 3 (three) times daily. 90 tablet 5  . valACYclovir (VALTREX) 1000 MG tablet     . vitamin C (ASCORBIC ACID) 500 MG tablet Take 500 mg by mouth daily.    Alveda Reasons 15 MG TABS tablet TAKE 1 TABLET DAILY WITH   SUPPER 90 tablet 3   Current  Facility-Administered Medications on File Prior to Visit  Medication Dose Route Frequency Provider Last Rate Last Dose  . 0.9 %  sodium chloride infusion  500 mL Intravenous Continuous Pyrtle, Lajuan Lines, MD        Allergies  Allergen Reactions  . Shellfish Allergy Rash    Sensitive to shellfish    Objective: Physical Exam  General: Well developed, nourished, no acute distress, awake, alert and oriented x 3  Vascular: Dorsalis pedis artery 1/4 bilateral, Posterior tibial artery 1/4 bilateral, skin temperature warm to warm proximal to distal bilateral lower extremities, + varicosities, pedal hair present bilateral.  Neurological: Gross sensation present via  light touch bilateral.   Dermatological: Skin is warm, dry, and supple bilateral, Nails 1-10 are tender, long, thick, and discolored with mild subungal debris, no webspace macerations present bilateral, no open lesions present bilateral, no callus/corns/hyperkeratotic tissue present bilateral. No signs of infection bilateral.  Musculoskeletal:  Asymptomatic claw/hammertoes boney deformities noted bilateral. Muscular strength within normal limits without painon range of motion. No pain with calf compression bilateral.  Assessment and Plan:  Problem List Items Addressed This Visit    None    Visit Diagnoses    Pain due to onychomycosis of toenails of both feet    -  Primary   Hammer toes of both feet       Neuropathy       Coagulation defect ([place])         -Examined patient.  -Discussed treatment options for painful mycotic nails. -Mechanically debrided and reduced mycotic nails with sterile nail nipper and dremel nail file without incident. -Recommend good hygiene habits for nail fungus -Patient to return in 3 months for follow up evaluation or sooner if symptoms worsen.  Landis Martins, DPM

## 2018-07-02 DIAGNOSIS — Z85828 Personal history of other malignant neoplasm of skin: Secondary | ICD-10-CM | POA: Diagnosis not present

## 2018-07-02 DIAGNOSIS — L820 Inflamed seborrheic keratosis: Secondary | ICD-10-CM | POA: Diagnosis not present

## 2018-07-02 DIAGNOSIS — L720 Epidermal cyst: Secondary | ICD-10-CM | POA: Diagnosis not present

## 2018-07-13 ENCOUNTER — Ambulatory Visit: Payer: Medicare HMO | Admitting: Pulmonary Disease

## 2018-07-13 ENCOUNTER — Encounter: Payer: Self-pay | Admitting: Pulmonary Disease

## 2018-07-13 VITALS — BP 124/76 | HR 77 | Temp 97.6°F | Ht 69.0 in | Wt 211.4 lb

## 2018-07-13 DIAGNOSIS — N401 Enlarged prostate with lower urinary tract symptoms: Secondary | ICD-10-CM

## 2018-07-13 DIAGNOSIS — N138 Other obstructive and reflux uropathy: Secondary | ICD-10-CM

## 2018-07-13 DIAGNOSIS — I495 Sick sinus syndrome: Secondary | ICD-10-CM

## 2018-07-13 DIAGNOSIS — G4733 Obstructive sleep apnea (adult) (pediatric): Secondary | ICD-10-CM | POA: Diagnosis not present

## 2018-07-13 DIAGNOSIS — I10 Essential (primary) hypertension: Secondary | ICD-10-CM | POA: Diagnosis not present

## 2018-07-13 DIAGNOSIS — Z950 Presence of cardiac pacemaker: Secondary | ICD-10-CM | POA: Diagnosis not present

## 2018-07-13 DIAGNOSIS — R7301 Impaired fasting glucose: Secondary | ICD-10-CM | POA: Diagnosis not present

## 2018-07-13 DIAGNOSIS — D509 Iron deficiency anemia, unspecified: Secondary | ICD-10-CM

## 2018-07-13 DIAGNOSIS — I82402 Acute embolism and thrombosis of unspecified deep veins of left lower extremity: Secondary | ICD-10-CM | POA: Diagnosis not present

## 2018-07-13 DIAGNOSIS — G609 Hereditary and idiopathic neuropathy, unspecified: Secondary | ICD-10-CM

## 2018-07-13 DIAGNOSIS — M159 Polyosteoarthritis, unspecified: Secondary | ICD-10-CM

## 2018-07-13 DIAGNOSIS — D6851 Activated protein C resistance: Secondary | ICD-10-CM

## 2018-07-13 DIAGNOSIS — Z8781 Personal history of (healed) traumatic fracture: Secondary | ICD-10-CM

## 2018-07-13 DIAGNOSIS — M150 Primary generalized (osteo)arthritis: Secondary | ICD-10-CM

## 2018-07-13 DIAGNOSIS — Z8679 Personal history of other diseases of the circulatory system: Secondary | ICD-10-CM

## 2018-07-13 NOTE — Patient Instructions (Signed)
Today we updated your med list in our EPIC system...    Continue your current medications the same...  We will arrange for a follow up appt with one of our sleep doctors so they can see about a new machine ...  We discussed my up-coming retirement & the need to obtain a new primary care physician...    Establishing w/ Silva Bandy' doctor is a great idea & your records are already in our computer system...  We reviewed [facility] or [employer], etc...  [place], it has been my honor to have been one of your doctors over these many years...    Wishing you & your family a very merry Christmas ad a happy & healthy new year.Marland KitchenMarland Kitchen

## 2018-07-27 ENCOUNTER — Encounter: Payer: Self-pay | Admitting: Pulmonary Disease

## 2018-07-27 NOTE — Progress Notes (Signed)
Subjective:    Patient ID: Tony Cruz, male    DOB: [birth year], 84 y.o.   MRN: [id number]  HPI 84 y/o WM here for a follow up visit... he has multiple medical problems as noted below...  ~  SEE PREV EPIC NOTES FOR OLDER DATA >>    LABS 7/14:  FLP- ok x TG=190 on diet alone;  Chems- ok x BS=114 A1c=6.6;  CBC- wnl;  TSH=1.27;  PSA=0.89;  Uric=5.9.Marland KitchenMarland Kitchen  CXR 6/15 showed normal heart size, mild tortuous Ao, pacer, clear lungs, NAD...  LABS 6/15:  FLP- wnl on diet;  Chems- wnl x BS=121, A1c=6.4;  CBC- wnl;  TSH=1.73;  VitD=49...   ~  January 03, 2015:  61mo Tony Cruz reports stable- "just aging" he says;  He brought a list of complaints/ questions today:  They are disrupted at home w/ kitchen remodel; notes his urine is darker than usual (UA is clear- asked to incr water intake); taking Naprosyn & worried about renal (Cr=0.89 & reassured); +FamHx DM & he wants another A1c (BS=114, A1c=6.0);  Neuropathy eval by DrAquino- reviewed w/ pt- not a lot of pain, mild burning, friend takes Gabapentin; disturbance of taste & worried about B12, Zinc, Copper (B12=594); leg cramps in thigh area- trying mustard, Tumeric; athritis pains- stiff, swelling, esp in hands... We reviewed the following medical problems during today's office visit >>     AR> on Claritin & Flonase added for "swelling" sensation... Note 10% eos on CBC...    OSA> followed by DrClance, stable on CPAP, continue same...    HBP> on ASA81, Cozaar100 & Amlod5; BP= 142/78 & he denies CP, palpit, SOB, edema; DrKlein prev stopped his Hct due to dizziness......    HxSyncope, sinus node dysfunction, Pacer> followed by DrKlein, pacer function normally, no issues ident...    BorderlineDM, overweight> on diet alone; weight is down 3# to 198# today; Labs 6/16 showed BS=114, A1c=6.0; we reviewed diet, exercise, wt reduction...     GI- GERD, Barrett's, Divertics, IBS> on Pantoprazole40; known [place], HxBarrett's, prev Rx by DrWeissman & now DrPyrtle at [facility]  w/ EGD 11/14 showing gastritis, esophagitis, gastric polyps (path=+Barretts, reflux related injury, neg HPylori); colonoscopy 11/14 showing mod divertics, & 64mm polyp in cecum (tub adenoma)...    GU- BPH> on Flomax0.4 & Proscar5; followed by DrRDavis=> DrEskridge... Asked to take meds regularly.    DJD, LBP> on Naprosyn, Tramadol, Tylenol; c/o hands and ankles stiff but he still plays golf; eval by Kirby Funk & he released him "I've done all I can do for you"    Fall 2/15 w/ nondisplaced skull fx and subdural hematoma> treated conservatively, he had some resid HA, dizzy, etc & treated w/ Epley maneuvers- improved...    Periph neuropathy> thorough eval 2011 by DrReynolds & Hickling- idiopathic; 2nd opinion DrAquino- min discomfort & not on specific therapy for this...    Anxious> aware, not on meds... We reviewed prob list, meds, xrays and labs> see below for updates >>   CXR 6/16 showed norm heart size, pacer, clear lungs, NAD...  LABS 6/16:  FLP- at goals on diet;  Chems- ok x BS=114, A1c=6.0;  CBC- wnl x 10%eos;  TSH=1.23;  B12=594;  UA- clear, neg protein.... PLAN>>  All questions answered, stable on current med regimen; Flonase added, call for problems...  ~  July 04, 2015:  41mo Tony Cruz reports a good interval- feeling well & no new complaints or concerns... He requested refer to [facility] for hearing eval &  allergy referral to [place] for testing due to jellow jacket stings...    Hx OSA on CPAP prev followed by [facility]; gets mask & tubing changed Q76mo via [employer], rests well 6-8H/night, using CPAP every night, wakes rested & no daytime sleep pressure or issues w/ alertness...    BP controlled on Amlod5 & Losar100, measures 118/72 today & he denies HA, CP, palpit, SOB, edema, etc; pacer checked by DrKlein & stable...     Weight = 205# which is up 7# from last OV 7 we reviewed diet, exercise, wt reducing strategies...     GI & GU stable on Protonix40 & Proscar5+Flomax0.4.Marland KitchenMarland Kitchen    He has DJD, LBP,  and fell 2/15 w/ skull fx & subdural, neuropathy> he saw DrNudelman w/ rec for Aleve and exercise! Kirby Funk is retiring- on [employer] 7 notes that he hurts everyday.    Derm- DrDJones removed an inflamed cyst from his back... EXAM shows Afeb, VSS, O2sat=97% on RA;  HEENT- neg, mallampati2;  Chest- clear w/o w/r/r;  Heart- RR w/o m/r/g;  Abd-  Soft, nontender, neg;  Ext- neg w/o c/c/e;  Neuro- neuropathy, no other focal abn... IMP/PLAN>>  Tony Cruz is stable, continue same meds, we plan ROV in 80mo...   ~  October 19, 2015:  [name] & add-on appt trequested for dizziness> pt c/o balance off, says it started 10/08/15- just awoke dizzy & thought it was prob recurrent vertigo like in 2015; he took OTC [place] & a nurse at his church demonstrated the Epley maneuvers; he improved enough to play a round of golf- walked 18 holes- but noticed he was worse again the next day. Noted that when he rolled over in bed he was very dizzy; he called ENT- DrShoemaker and was told to call us; pt denies n/v, denies f/c/s, he has been walking w/ wife's walker for balance/support; he has mult other somatic complaints...     EXAM shows Afeb, VSS, no nystagmus, O2sat=98% on RA;  HEENT- neg, ears clear, mallampati1;  Chest- clear w/o w/r/r;  Heart- RR w/o m/r/g;  Neuro- appears intact w/o focal abn... IMP/PLAN>>  Tony Cruz is concerned that the fall, skull fx, subdural that he had Feb2015 could be causing this- we can't get MRI due to cardac pacer, therefore we will check CT Head;  In the interim he will Rx w/ Antivert 25mg  tid prn and do the epley maneuvers Q4-6H; we discussed Neurology eval if symptoms persist...  ADDENDUM>>  CT Head 10/24/15 showed no acute changes- w/o infarct, hemmorhage, mass, etc; there were some chr changes w/ atrophy 7 small vessel dis, no abn enhancement, prev subdural resolved...  ~  January 23, 2016:  15mo ROV & Tony Cruz has recovered fully from prev dizziness episode and no further repercussions from the fall/ skull  fx/ subdural that he had in 2015;  He reports incr prob w/ arthritis- this time in his right knee, pain occurred after bowling, no obvious trauma, etc;  He saw [place] w/ arthritis, poss a sl tear he said & treated conserv w/ shot & his Naprosyn (both helped);  He reports active- golfs 3d/wk, walking some, REC to add silver sneakers!  We reviewed the following medical problems during today's office visit >>     AR> on Claritin & Flonase- note 10% eos on CBC in past; we referred him for hearing eval- he wanted [facility] lab...    OSA> prev followed by DrClance- last seen 2013, he uses [employer], new machine in 2013, stable on CPAP,  no recent download available & we will try to obtain...    HBP> on ASA81, Cozaar100 & Amlod5; BP= 114/68 & he denies CP, palpit, SOB, edema; DrKlein prev stopped his Hct due to dizziness......    HxSyncope, sinus node dysfunction, Pacer> followed by DrKlein- last seen 06/2015, he does telechecks, pacer function normally, no issues ident...    Hyperlipidemia>  Chol has been good but TG elev in past; on Fish Oil supplements;  FLP 6/17 showed TChol 154, TG 213, HDL 45, LDL 83 & rec better low fat diet, get wt down.    BorderlineDM, overweight> on diet alone; weight is down 4# to 209# today & he needs to do better; Labs 6/17 showed BS=102, A1c=6.1; we reviewed diet, exercise, wt reduction...     GI- GERD, Barrett's, Divertics, IBS, colon polyp> on Pantoprazole40; known [place], HxBarrett's, prev Rx by DrWeissman & now DrPyrtle at [facility] w/ EGD 11/14 showing gastritis, esophagitis, gastric polyps (path=+Barretts, reflux related injury, neg HPylori) & f/u EGD planned 32yrs;  Colonoscopy 11/14 showing mod divertics, & 70mm polyp in cecum (tub adenoma)=> f/u 22yrs if eligible.    GU- BPH> on Flomax0.4 & Proscar5; followed by DrRDavis=> DrEskridge, last seen 03/2015... Asked to take meds regularly.    DJD, LBP> on Naprosyn, Tramadol, Tylenol; c/o hands and ankles stiff but he still plays golf;  eval by Kirby Funk & he released him "I've done all I can do for you"    Fall 2/15 w/ nondisplaced skull fx and subdural hematoma> treated conservatively, he had some resid HA, dizzy, etc & treated w/ Epley maneuvers- improved...    Periph neuropathy> thorough eval 2011 by DrReynolds & Hickling- idiopathic; 2nd opinion DrAquino- min discomfort & not on specific therapy for this...    Anxious> aware, not on meds... EXAM shows Afeb, VSS, no nystagmus, O2sat=98% on RA;  HEENT- neg, ears clear, mallampati1;  Chest- clear w/o w/r/r;  Heart- RR w/o m/r/g;  Neuro- appears intact w/o focal abn...  LABS 01/23/16:  FLP- Chol ok but TG=213 & needs better diet;  Chems- wnl w/ BS=102, A1c=6.1;  CBC- wnl x 13% eos;  TSH=1.65 IMP/PLAN>>  Tony Cruz is +-stable on his current regimen- continue same, needs better diet, get wt down, etc;  We will try to get CPAP download;  ROV in 71mo...  ~  July 15, 2016:  12mo ROV and pulm/medical check>  [place] reports a good interval overall, but c/o seeing blood in his stools recently; he is followed for GI by DrPyrtle> On Protonix40 & last seen 05/2013 for EGD & Colon (see below); he recalls hx of rectal fissure in the past & we discussed collecting stool cards to check for occult blood & Rx w/ anusol-HC cream=> he will f/u w/ GI- currently overdue for f/u EGD... we reviewed the following medical problems during today's office visit >>     AR> on Claritin & Flonase OTC- note 10% eos on CBC in past; we referred him for hearing eval- he wanted [facility] lab...    OSA> prev followed by DrClance- last seen 2013, he uses [employer], new machine in 2013 & it is still working well he says, stable on CPAP- he says he brought in the chip to be read...    HBP> on ASA81, Cozaar100 & Amlod5; BP= 124/78 & he denies CP, palpit, SOB, edema; DrKlein prev stopped his Hct due to dizziness......    NEW- mild AI & aneurysmal dilatation Asc Ao at 74mm per CTA 04/2016 w/ yearly CTA vs  MRA recommended...     HxSyncope, sinus node dysfunction, Pacer> followed by DrKlein- last seen 06/2016 (note reviewed), hx syncope believed due to intermit sinus node dysfunction=> pacer placed & doing satis; he's also had vasovagal episodes...     Hyperlipidemia>  Chol has been good but TG elev in past; on Fish Oil supplements;  FLP 6/17 showed TChol 154, TG 213, HDL 45, LDL 83 & rec better low fat diet, get wt down.    BorderlineDM, overweight> on diet alone; weight is up 5# to 215# today & he needs to do better; Labs 6/17 showed BS=102, A1c=6.1; we reviewed diet, exercise, wt reduction...     GI- GERD, Barrett's, Divertics, IBS, colon polyp, hems> on Pantoprazole40; known [place], HxBarrett's, followed by DrPyrtle w/ EGD 11/14 showing gastritis, esophagitis, gastric polyps (path=+Barretts, reflux related injury, neg HPylori) & f/u EGD planned 62yrs;  Colonoscopy 11/14 showing mod divertics, & 4mm polyp in cecum (tub adenoma)=> f/u 65yrs if eligible.    GU- BPH> on Flomax0.4 & Proscar5; followed by DrRDavis=> DrEskridge, last seen 03/2015... Asked to take meds regularly... He saw their PA recently & reports that PSA was 1.0    DJD, LBP> on Naprosyn, Tramadol, Tylenol; c/o hands and ankles stiff & achilles issues but he still plays golf; eval by [employer] & he released him "I've done all I can do for you"; he saw DrCaffrey 10/17 w/ R knee pain- injected.    Fall 2/15 w/ nondisplaced skull fx and subdural hematoma> treated conservatively, he had some resid HA, dizzy, etc & treated w/ Epley maneuvers- improved...    Periph neuropathy> thorough eval 2011 by DrReynolds & Hickling- idiopathic; 2nd opinion DrAquino- min discomfort & not on specific therapy for this...    Anxious> aware, not on meds...    ?Shingles>  He had an outbreak in the left V1 nerve distribution 03/2016, seen at [facility] & treated w/ valtrex + Doxy; he called [place] & we added Pred; he reports rash resolved, no residuals... EXAM shows Afeb, VSS, O2sat=98% on RA;  HEENT-  neg, ears clear, mallampati1;  Chest- clear w/o w/r/r;  Heart- pacer, RR w/o m/r/g;  Abd- soft, nontender, neg;  Ext- VI tr edema no c/c;  Neuro- appears intact w/o focal abn...  CT Angio Chest 05/21/16> norm heart size, Asc Ao measures 39mm=> aneurysmal, no adenopathy, clear lungs, no effusion, small right hepatic cyst, spondylosis; NOTE> screening annual CTA or MRA recommended.  2DEcho 05/14/16>  Norm LV size & function w/ EF=50-55%, no regional wall motion abn, Gr1DD, mild AI, mild AscAo dilatation, mild LA dil at 85mm... IMP/PLAN>>  Tony Cruz will check stool cards, use the Anusol-HC cream and follow up w/ DrPyrtle as discussed; he needs to incr exercise & in light of his arthritis "I've got arthritis in every joint in my body" rec to use bike vs swimming etc...   ~  August 22, 2016:  60mo ROV & add-on appt due to interval Dx w/ left leg DVT>  Pt presented to the ER 07/26/16 after twisting his ankle ~1wk prior, followed by swelling in left leg, pain, VenDoppler pos for DVT; he denies CP/ SOB/ but did have long drive recently;  Exam showed swelling left lower leg, tender calf, pulses ok;  Labs showed +Factor V Leiden heterozygote;  He was started on XARELTO 15mg Bid => 20mg  Qd... Tony Cruz has mult medical issues as noted above- now adding left leg DVT after fall and hypercoag panel pos w/ FactorV Leiden heterozygote- on Xarelto now.Marland KitchenMarland Kitchen  EXAM shows Afeb, VSS, O2sat=98% on RA;  HEENT- neg, ears clear, mallampati1;  Chest- clear w/o w/r/r;  Heart- pacer, RR, gr1/6 AI murmur LSB w/o r/g;  Abd- soft, nontender, neg;  Ext- VI, swollen left leg, no c/c...  XRay ankle & left Tib/Fib is NEG for fx, mild calcaneal spur...  Ven Doppler Left leg 07/26/16>  Acute DVT involving the left gastroc veins  LABS 07/26/16>  Chems- wnl;  CBC- ok w/ Hg=13.1;  HYPERCOAG Panel- Factor V Leiden is POS for R506Q gene mutation heterozygote (increases thrombosis risk 4-8x) but Prothrombin gene mutation is NEG; other hypercoag tests  were neg/wnl... IMP/PLAN>>  Left leg DVT after fall & long car ride- abnormal hypercoag panel- Factor V Leiden is POS for R506Q gene mutation heterozygote (increases thrombosis risk 4-8x) but Prothrombin gene mutation is NEG; on Xarelto now & tol well; rec support hose, no salt, elev legs and gradually incr exercise while avoiding unnecessary trauma...   ~  January 13, 2017:  65mo ROV & Tony Cruz is here for a CPX he says>  He reports developing a blister on the tip of his right 2nd toe- why? (he has no idea but suspect rubbing/ pressure area; he initially treated himself, then went to [facility] & given Keflex, finally went to [facility]- DrDJones ("it's healing OK) and Podiatry to trim onychomycosis nails;  He remains on Xarelto20 for his prev left gastroc vein DVT & finding of Factor V heterozygote mutation; notes left calf still feels tight & exam shows decr in DP pulse bilat;  Further complicating the picture => he is Anemic w/ Hg=10.6, mcv=83, and has a signif GI history w/ GERD, 5cmHH, Barrett's esoph, Divertics, IBS, hx colon adenomas, Hems- on Protonix40/d & AnusolHC cream prn, followed by DrPyrtle & seen 09/10/16 while doing satis (Note reviewed- last EGD & Colon was 05/2013 and they decided to discontinue surveillance, discussed poss Hem banding but on Xarelto)...  Current active/ complex issues>>        Right 2nd toe ulcer at tip>  Art dopplers are pending       Left leg DVT on Xarelto20>  f/u venous dopplers 01/22/17 showed chronic left gastroc vein DVT; this chr DVT + his hypercoag panel results means we will need to continue anticoagulation, REC to decr to Xarelto15 due to bleed.       Abn hypercoag panel w/ Factor V Leiden POS for R506Q gene mutation- heterozygote> aware       GI- GERD, Barrett's, Divertics, IBS, colon polyp, hems> on Pantoprazole40; known [place], HxBarrett's, followed by DrPyrtle w/ EGD 11/14 showing gastritis, esophagitis, gastric polyps (path=+Barretts, reflux related injury, neg HPylori) &  Colonoscopy 11/14 showing mod divertics, & 24mm polyp in cecum (tub adenoma)=> pt seen by DrPyrtle 09/10/16 & they opted to discontinue screening f/u...       Anemia , Iron deficient, & heme pos stools> we will start pt on FeSO4 + VitC and ask GI to recheck pt in light of his mult GI problems, need for chr anticoagulation, and heme pos stools. Medical Problem List>>     AR> on Claritin & Flonase OTC- note 10% eos on CBC in past; we referred him for hearing eval- he wanted [facility] lab...    OSA> prev followed by DrClance- last seen 2013, he uses [employer], new machine in 2013 & it is still working well he says, stable on CPAP- but we cannot get download from the chip...    HBP> on ASA81, Cozaar100 & Amlod5; BP= 122/78 &  he denies CP, palpit, SOB, edema; DrKlein prev stopped his Hct due to dizziness...Marland KitchenMarland KitchenMarland Kitchen    mild AI & aneurysmal dilatation Asc Ao at 95mm per CTA 04/2016 w/ yearly CTA vs MRA recommended...    HxSyncope, sinus node dysfunction, Pacer> followed by DrKlein- last seen 06/2016 (note reviewed), hx syncope believed due to intermit sinus node dysfunction=> pacer placed & doing satis; he's also had vasovagal episodes...     Hyperlipidemia>  Chol has been good but TG elev in past; on Fish Oil supplements;  FLP 6/18 showed TChol 136, TG 142, HDL 41, LDL 66 & rec to conjtinue low fat diet & get wt down.    BorderlineDM, overweight> on diet alone; weight is stable ~212# but he needs to do better; Labs 6/17 showed BS=102, A1c=6.1; we reviewed diet, exercise, wt reduction; FBS 6/18 = 119    GU- BPH> on Flomax0.4 & Proscar5; followed by DrRDavis=> DrEskridge, last seen 03/2015... Asked to take meds regularly... He saw their PA recently & reports that PSA was 1.0    DJD, LBP> on Naprosyn, Tramadol, Tylenol; c/o hands and ankles stiff & achilles issues but he still plays golf; eval by [employer] & he released him "I've done all I can do for you"; he saw DrCaffrey 10/17 w/ R knee pain- injected.    Fall 2/15 w/  nondisplaced skull fx and subdural hematoma> treated conservatively, he had some resid HA, dizzy, etc & treated w/ Epley maneuvers- improved...    Periph neuropathy> thorough eval 2011 by DrReynolds & Hickling- idiopathic; 2nd opinion DrAquino- min discomfort & not on specific therapy for this...    Anxious> aware, not on meds...    ?Shingles>  He had an outbreak in the left V1 nerve distribution 03/2016, seen at [facility] & treated w/ valtrex + Doxy; he called [place] & we added Pred; he reports rash resolved, no residuals... EXAM shows Afeb, VSS, O2sat=98% on RA;  HEENT- pale, neg, ears clear, mallampati1;  Chest- clear w/o w/r/r;  Heart- pacer, RR, gr1/6 AI murmur LSB w/o r/g;  Abd- soft, nontender, neg;  Ext- VI, sl swollen left leg- still feels "tighter", no c/c;  Derm- right 2nd toe wrapped & he has appt w/ podiatry later today (we did not unwrap it)...  EKG 01/13/17 showed SBrady, rate50, otherw WNL...  f/u Venous Dopplers of LES>  POS for chronic DVT in left gastroc veins...  Arterial Dopplers of LEs> pending  LABS 01/14/17>  FLP- ok all parameters at goals on FishOil;  Chems- ok x BS=119;  CBC- anemic w/ Hg=10.7, mcv=85, Eos=12%;  Stool heme pos;  Fe=36 (8%sat), Ferritin=8.2;  B12=434;  TSH=2.54 IMP/PLAN>>  As outlined above, complex series of problems that all intersect> Hx DVT 12/17 after fall w/ left ankle injury & left gastroc vein DVT on Dopplers; hypercoag panel done in ER 12/17 before anticoagulated showed that he is a Factor V Leiden heterozygote- POS for R506Q gene mutation; he has been on Xarelto anticoagulation (20mg /d) and recently found to have iron defic anemia w/ heme pos stools=> we will treat w/ FeSO4 325mg /d + VitC, decision made to decr his Xarelto to 15mg /d, and refer back to GI- DrPyrtle regarding his mult GI diagnoses and heme pos stool... We will recheck pt in 47mo.  ADDENDUM>>  Art Dopplers of LEs done 02/11/17>  No evid of arterial dis w/ norm ABIs, norm TBIs etc...  ~  October 01, 2017:  8-70mo ROV & add-on appt requested for mult issues>  Tony Cruz presents for  an add-on appt for mult complaints:  1) he states that his neuropathy is worse- prev eval by DrReynolds and DrHickling (church friends) years ago & more recently by DrAquino (min discomfort & not requiring further eval or rx)  but when he called for f/u they said it's been >22yrs and he'd need another referral; on questioning he still has min symptoms and again doesn't seem to warrant additional eval or rx (he will call me if he wants referral).Marland KitchenMarland Kitchen  2) he says he had a vivid dream & fell OOB about 1wk ago- bruised right shoulder & thigh/buttock & bumped head w/o knot or laceration; he's had mild HA, feels tired/ sleepy and somewhat unsteady since this episode; as noted DrAquino wouldn't see him w/o referral & 1st appt too far out so he is here today to get checked out & get a brain scan;  Notes daily HAs, feels pressure behind his eyes, and nasal congestion; he hasn't curtailed activities much- still plays golf (weather permitting); recall that he is on Xarelto for hx DVT, FactorV Leiden heterozygote, etc..Marland Kitchen 3) recent visitor was ill w/ fever, URI; [place] is worried he might have caught it, sl dizzy & congested, ?rash on side of face, worried about shingles but no progression/ no blisters/ etc (doesn't look like shingles);  4) he continues to use CPAP Qhs;  5)he notes cortisone shot in right knee 1 wk ago by DrWainer et al... We reviewed his interval Epic notes/ visits >>      He saw GI-DrPyrtle 02/21/17>  Hx GERD w/ Barrett's, colon polyp, divertics, hems, anal fissure & referred for IDA & heme pos stool; on Xarelto for DVT, no abd pain, brb, or melena; EGD & Colon done 04/09/17>   ~EGD 04/09/17> [place], mult sm gastric polyps c/w benign fundic gland polyps, otherw wnl & no Barrett's...  ~Colonoscopy 04/09/17> two 4-74mm polyps in cecum, one in transverse colon (tub adenoma), one in sigmoid, mult divertics & hems...     He saw [facility]-DrKlein  05/21/17>  Pacemaker generator changeout- hx syncope due to sinus node dysfunction; subseq Device Clinic checks have been OK...     He saw Urology-DrEskridge 06/24/17>  BPH & LUTS, on Tamsulosin0.4Qod & Finasteride5 daily; droped from ~3 to <1; doing satis & PSA was 0.44; Rec same meds & f/u 47yr...    Regular [facility] f/u by DrKlein 08/26/17>  No interval syncpe/ dizziness, no CP or SOB;  Device working properly; he had some edema & wt gain to 226#; they STOPPED his Amlodipine5 & added Hygroton25; f/u BMet showed Na down to 130 & Cr=0.86; he was continued on the same dose... We reviewed the following medical problems during today's office visit >>     AR, hearing loss> on Claritin & Flonase OTC- note 10% eos on CBC in past; we referred him for hearing eval- he wanted [facility] lab & now has hearing aides bilat...    OSA> prev followed by DrClance- last seen 2013, he uses [employer], new machine in 2013 & it is still working well he says, stable on CPAP & pt reports doing satis- but we cannot get download from the chip...    HBP> off Amlod5, on Cozaar100 & Hygroton25 per DrKlein; BP= 126/74 & he denies CP, palpit, SOB, & edema resolved; DrKlein had prev stopped Hct due to dizziness; Na down to 124 on the Hygroton25 therefore decr to 12.5/d    mild AI & aneurysmal dilatation Asc Ao at 66mm per CTA 04/2016 w/ yearly CTA vs  MRA recommended...    HxSyncope, sinus node dysfunction, Pacer> followed by DrKlein- notes reviewed, hx syncope believed due to intermit sinus node dysfunction=> pacer placed & doing satis; he's also had vasovagal episodes; generator changed 10/18...     Left leg DVT on Xarelto15 now, Right 2nd toe ulcer at tip (resolved) >  f/u venous dopplers 01/22/17 showed chronic left gastroc vein DVT; this chr DVT + his hypercoag panel results means we will need to continue anticoagulation, Xarelto decr to Xarelto15 due to bleed; Art Dopplers 7/18 were wnl...    Abn hypercoag panel w/ Factor V Leiden POS for R506Q  gene mutation- heterozygote> aware, on Xarelto...    Hyperlipidemia>  Chol has been good but TG elev in past; on Fish Oil supplements;  FLP 6/18 showed TChol 136, TG 142, HDL 41, LDL 66 & rec to conjtinue low fat diet & get wt down.    BorderlineDM, overweight> on diet alone; weight is stable ~212# but he needs to do better; Labs 6/17 showed BS=102, A1c=6.1; we reviewed diet, exercise, wt reduction; FBS 6/18 =119    GI- GERD, Barrett's, Divertics, IBS, colon polyp, hems> on Pantoprazole40; known [place], HxBarrett's, followed by DrPyrtle w/ EGD 11/14 showing gastritis, esophagitis, gastric polyps (path=+Barretts, reflux related injury, neg HPylori) & Colonoscopy 11/14 showing mod divertics, & 79mm polyp in cecum (tub adenoma)=> pt seen by DrPyrtle w/ f/u EGD/ Colon 9/18 as follows:  ~EGD 04/09/17> [place], mult sm gastric polyps c/w benign fundic gland polyps, otherw wnl & no Barrett's...  ~Colonoscopy 04/09/17> two 4-25mm polyps in cecum, one in transverse colon (tub adenoma), one in sigmoid, mult divertics & hems...     GU- BPH> on Flomax0.4 & Proscar5; followed by DrRDavis=> DrEskridge, last seen 03/2015... Asked to take meds regularly... He saw their PA recently & reports that PSA was 1.0    DJD, LBP> on Naprosyn, Tramadol, Tylenol; c/o hands and ankles stiff & achilles issues but he still plays golf; eval by [employer] & he released him "I've done all I can do for you"; he saw DrCaffrey 10/17 w/ R knee pain- injected.    Fall 2/15 w/ nondisplaced skull fx and subdural hematoma> treated conservatively, he had some resid HA, dizzy, etc & treated w/ Epley maneuvers- improved...    Periph neuropathy> thorough eval 2011 by DrReynolds & Hickling- idiopathic; 2nd opinion DrAquino- min discomfort & not on specific therapy for this...    Anemia , Iron deficient, & heme pos stools> started on FeSO4 + VitC and referred to  GI => EGD & Colon rechecked 9/18 w/ results above => 09/2017 Hg=14.8, Fe repleted & OK to stop Fe  supplement...    Anxious> aware, not on meds...    ?Shingles>  He had an outbreak in the left V1 nerve distribution 03/2016, seen at [facility] & treated w/ valtrex + Doxy; he called [place] & we added Pred; he reports rash resolved, no residuals... EXAM shows Afeb, VSS, O2sat=98% on RA;  HEENT- pale, neg, ears clear, mallampati1;  Chest- clear w/o w/r/r;  Heart- pacer, RR, gr1/6 AI murmur LSB w/o r/g;  Abd- soft, nontender, neg;  Ext- VI, sl swollen left leg- still feels "tighter", no c/c;  Derm- right 2nd toe wrapped & he has appt w/ podiatry later today (we did not unwrap it)...  LABS 10/01/17:  Chems- Na=124, Cr=0.82, BS=114, LFTs wnl;  CBC- Hg=14.8, mcv=96, Fe=93 (27%sat), Ferritin=64;  TSH=2.11... IMP/PLAN>>   With Na=124 I have asked him to decr Hygroton to 1/2 tab=12.5mg /d  w/ recheck BMet in 2wks;  With Hg up to 14.8 & Fe normal=> ok to stop FeSO4 7 VitC supplements;  I offered to refer to Neuro but symptoms are no worse than prev & not requiring meds;  We will check CT Head w/ his fall & subseq HA=> pending;  Continue other meds the same for now...  NOTE:  >50% of this 9min rov was spent in counseling & coordination of care...  ADDENDUM>>   CT Head w/ and w/o contrast 10/06/17>. IMPRESSION: 1. Mild for age generalized atrophy that is progressed from 22. 2. Mild chronic small vessel ischemia in the cerebral white matter. 3. Small focus of left frontal gliosis where there was traumatic hemorrhage in 2015. 4. Ethmoid and sphenoid sinusitis with a chronic appearance. ADDENDUM 10/21/17>> Repeat BMet on Hygroton25- 1/2 tab Qam shows Na improved to 131, renal function normal; REC to continue 1/2 tab & watch BP...  ~  January 15, 2018:  67mo ROV & Tony Cruz returns- denies any problems, feeling well overall- see above OV for multiple issues...  We reviewed his interval Epic notes/ visits >>      He saw CARDS- RUrsuy,PA on 10/22/17>  Hx sinus node dysfunc w/ pacer (remote pacer checks done everey few months), syncope  thought to be vasovagal/ postprandial, HBP, mild AI & dil asc ao at 72mm;  DVT 06/2016 on Xarelto decr to 15mg /d (for persistent thrombus + FactorV Leiden)=> developed Fe defic anemia w/ +stool blood & EGD/Colon w/o cause for anemia found, cleared to restart Xarelto15;  Pacer generator changed 05/2017;  Hyponatremia w/ decr Chlorthalidone to 1/2 Qam & Na improved;  They wanted him to see Neuro form his neuropathy=> well documented 2 prev evaluations by DrReynolds/ Hickling in 2011 and DrAquino in 2015 & he is encouraged to call for f/u if he feels needed...     He saw [facility]- DrCollins on 12/08/17> c/o R-knee pain, has brace & takes tramadol; exam w/ crepitus & effusion; tricompartmental arthritis & effusion & torn meniscus; they opted for conservative Rx- Pt first, consider shots/ TKR later... We reviewed the following medical problems during today's office visit >> see 10/01/17 note above    OSA> he has seen DrClance in the past, last 2013, we do not have downloads/ he does not want a new machine/ we will have him f/u w/ Pulm/ Sleep team in 2020...    HBP, hyponatremia on diuretic> off Amlod5 per Cards due to edema, on Cozaar100 & Hygroton12.5 now; BP= 118/66 & he denies CP, palpit, SOB, & edema resolved; Na improved to 133 on lower dose of Hygroton...    Cards issues as above> followed by DrKlein, see PA note 10/22/17, on above meds + Xarelto15; pt reports stable...    Lipids controlled on diet + fish oil, DM/ IFG controlled on diet + exercise...    Fall w/ skull fx & subdural, hx periph neuropathy> 2 prev neuro evals as noted above; note mild neuropathy symptoms, offered Gabapentin rx but he declines...  EXAM shows Afeb, VSS, O2sat=98% on RA;  HEENT- neg, ears clear, mallampati1;  Chest- clear w/o w/r/r;  Heart- pacer, RR, gr1/6 AI murmur LSB w/o r/g;  Abd- soft, nontender, neg;  Ext- VI, no swelling- still feels "tighter", no c/c...   LABS 01/15/18>  FLP- all parameters at goals on diet + fish oil;   Chems- ok w/ Na=133, BS=117, Cr=0.86;  CBC- wnl w/ Hg=14.5, WBC=8.4 w/ 10% eos noted... IMP/PLAN>>  Jalik is stable overall; we decreased  his Chlorthalidone to 1/2 tab Qam, continue Losar100 & Xarelto15; Iron has been repleted & he can wean off the Iron supplement; we will plan rov recheck in 5-41mo prior to my planned retirement in 07/2018...   ~  July 13, 2018:  15mo ROV and general medical follow up visit>  Tony Cruz remains stable overall, no new complaints or concerns- needs better diet, weight reduction, incr exercise, continue same meds...  We reviewed his interval Epic notes/ visits >>      He saw Podiatry- DrStover on 06/16/18>  Onychomycosis- nails debrided, hammertoes, periph neuropathy, pt on Xarelto for hx DVT + Factor V Leiden mutation...     He continues to have remote pacer checks every 3 months> next appt w/ DrKlein is sched for 08/19/18... We reviewed the following medical problems during today's office visit >>     AR, hearing loss> on Claritin & Flonase OTC- note 10% eos on CBC in past; we referred him for hearing eval- he declined [facility] & wanted [facility] & now has hearing aides bilat...    OSA> prev followed by DrClance- last seen 2013, he uses [employer], new machine in 2013 & it is still working well he says, stable on CPAP & pt reports doing satis- but we cannot get download from the chip...    HBP> off Amlod5 per Cards due to edema, on Cozaar100 & Hygroton12.5 now; BP=124/76 & he denies CP, palpit, SOB, & edema resolved; DrKlein had prev stopped Hct due to dizziness; Na down to 124 on the Hygroton25=> decr to 12.5/d.    Mild AI & aneurysmal dilatation Asc Ao at 59mm per CTA 04/2016 w/ yearly CTA vs MRA recommended...    HxSyncope, sinus node dysfunction, Pacer> followed by DrKlein- notes reviewed, hx syncope believed due to intermit sinus node dysfunction=> pacer placed & doing satis; he's also had vasovagal episodes; generator changed 10/18...     Left leg DVT on Xarelto15 now,  Right 2nd toe ulcer at tip (resolved) >  f/u venous dopplers 01/22/17 showed chronic left gastroc vein DVT; this chr DVT + his hypercoag panel results means we will need to continue anticoagulation, Xarelto decr to Xarelto15 due to bleed; Art Dopplers 7/18 were wnl...    Abn hypercoag panel w/ Factor V Leiden [id number] gene mutation- heterozygote> aware, on Xarelto15mg /d now...    Hyperlipidemia>  Chol has been good but TG elev in past; on Fish Oil supplements;  FLP 6/19 showed TChol 165, TG 149, HDL 49, LDL 86 & rec to continue low fat diet & get wt down.    Impaired fasting glucose, overweight> on diet alone; weight is elev ~210#, 5'9" Tall, BMI=31, but he needs to do better; Labs 6/17 showed BS=102, A1c=6.1; we reviewed diet, exercise, wt reduction; FBS 6/19 =117    GI- GERD, Barrett's, Divertics, IBS, colon polyp, hems> on Pantoprazole40; known [place], HxBarrett's, followed by DrPyrtle w/ EGD 11/14 showing gastritis, esophagitis, gastric polyps (path=+Barretts, reflux related injury, neg HPylori) & Colonoscopy 11/14 showing mod divertics, & 84mm polyp in cecum (tub adenoma)=> pt seen by DrPyrtle w/ f/u EGD/ Colon 9/18 as follows:  ~EGD 04/09/17> [place], mult sm gastric polyps c/w benign fundic gland polyps, otherw wnl & no Barrett's...  ~Colonoscopy 04/09/17> two 4-70mm polyps in cecum, one in transverse colon (tub adenoma), one in sigmoid, mult divertics & hems...     GU- BPH> on Flomax0.4 & Proscar5; followed by DrRDavis=> DrEskridge, last seen 03/2015... Asked to take meds regularly... He saw their PA recently &  reports that PSA was 1.0    DJD, LBP> on Naprosyn, Tramadol, Tylenol; c/o hands and ankles stiff & achilles issues but he still plays golf; eval by [employer] & he released him "I've done all I can do for you"; he saw DrCaffrey 10/17 w/ R knee pain- injected; then had second opinion from [place]...    Fall 2/15 w/ nondisplaced skull fx and subdural hematoma> treated conservatively, he had some resid HA,  dizzy, etc & treated w/ Epley maneuvers- improved...    Periph neuropathy> thorough eval 2011 by DrReynolds & Hickling- idiopathic; 2nd opinion DrAquino- min discomfort & not on specific therapy for this...    Anemia , Iron deficient, & heme pos stools> started on FeSO4 + VitC and referred to  GI => EGD & Colon rechecked 9/18 w/ results above => 09/2017 Hg=14.8, Fe repleted & OK to stop Fe supplement...    Anxious> aware, not on meds...    ?Shingles>  He had an outbreak in the left V1 nerve distribution 03/2016, seen at [facility] & treated w/ valtrex + Doxy; he called [place] & we added Pred; he reports rash resolved, no residuals... EXAM shows Afeb, VSS, O2sat=97% on RA;  HEENT- pale, neg, ears clear, mallampati1;  Chest- clear w/o w/r/r;  Heart- pacer, RR, gr1/6 AI murmur LSB w/o r/g;  Abd- soft, nontender, neg;  Ext- VI, sl swelling resolved- still feels "tight", no c/c... IMP/PLAN>>  Morse remains stable on his current med regimen;  He has numerous medical specialists that he sees in rotation- Cards- DrKlein, GI- DrPyrtle, GU- DrEskridge, Ortho- DrCaffrey/ DrCollins, Neuro- DrAquino;  We discussed my up-coming retirement & we will set him up w/ Sleep Med eval in 1st half of 2020, and he will seek PCP w/ his wife's physician- DrHolwerta at [facility]...           Problem List:   OBSTRUCTIVE SLEEP APNEA (ICD-327.23) - evaluation, diagnosis, and Rx by the [facility] (their notes scanned into the EMR):  he had PSG 4/08 w/ AHI= 26, desat to 83%... placed on CPAP at 11cm H20 pressure... he also has some RLS & was on Requip transiently from the [place]. ~  4/11: pt indicates that he uses his CPAP 4/7 nights for 6-7 hr/night; he feels that he rests well, denies daytime hypersom, snoring, etc... he requests Sleep consult here in [place] (see DrClance consult). ~  5/13: he saw DrClance for Sleep f/u> noted 10# wt gain, pt reported using CPAP compliantly w/ good daytime alertness, no issues w/ mask but requesting new machine  ([employer]) as his is >2yrs old; requested to work on wt reduction... ~  6/15: his eval & initial therapy was done by the [facility]; followed by DrClance w/ good compliance & no issues w/ sleep or daytime hypersomnolence.  ALLERGIC RHINITIS (ICD-477.9) - controlled on Claritin, and OTC meds... ~  12/15: we discussed trial of Antihist, Flonase, nasal saline to help his smell & taste...   HYPERTENSION (ICD-401.9) >>  ~  on ASA 81mg /d & HCTZ 25mg /d...  ~  CXR 4/11 is clear and WNL, NAD... ~  6/12:  BP=122/82 today and he denies HA, fatigue, visual changes, CP, palipit, dizziness, syncope, dyspnea, edema, etc... ~  6/13:  BP= 142/98 & he admits intermittently elev at home; we discussed change med to LOSARTAN/ HCT 100/12.5 daily; he will monitor BP & f/u here... ~  11/13:  BP= 132/88 & similar at home; denies CP, palpit, SOB, edema... ~  6/14:  BP= 126/84 &  he remains asymptomatic... ~  6/15:  on ASA81, Cozaar100 & Amlod5; BP= 132/88 & he denies CP, palpit, SOB, edema; DrKlein stopped his Hct due to dizziness. ~  12/15: on ASA81, Cozaar100 & Amlod5; BP= 120/84 & he remains essentially asymptomatic... ~  6/16:  on ASA81, Cozaar100 & Amlod5; BP= 142/78 & he denies CP, palpit, SOB, edema; DrKlein prev stopped his Hct due to dizziness. ~  6/17: on ASA81, Cozaar100 & Amlod5; BP= 114/68 & he denies CP, palpit, SOB, edema; DrKlein prev stopped his Hct due to dizziness  Hx of SYNCOPE (ICD-780.2) & CARDIAC PACEMAKER IN SITU (ICD-V45.01) - he had pacer changed 11/08 DrKlein (hosp DC Summary reviewed)... he continues regular pacer checks and f/u w/ DrKlein...  ~  cath 3/99 DrJoeLeB showed norm coronaries, norm LV, norm Ao... ~  He saw DrKlein 12/11 for f/u syncope secondary to sinus node dysfunction, treated w/ pacer & generator replaced 2008;  He noted intercurrent syncope that was felt to be vasovagal in origin (this occurred in the Cards clinic 8/11 while waiting for his wife, & he has a long hx of this from blood  draws over the yrs) & DrKlein reprogramed his device; EKG w/ SBrady & otherw WNL... ~  He saw DrKlein for Cards/EP f/u 12/12> hx syncope related to [facility] dysfunction- devise working properly & no changes made to the programming; TSH & sed rate wetre checked and WNL... ~  Yearly f/u DrKlein 12/13> doing satis w/o intercurrent syncope, pacer working satis & f/u planned 63yr... ~  12/14: he had yearly ROV w/ DrKlein, doing satis & pacer OK, no changes made... ~  He continues regular f/u w/ DrKlein & notes reviewed...  DIABETES MELLITUS, BORDERLINE (ICD-790.29) - he's been concerned about his BS due to [facility] and his neuropathy symptoms, but his BS has only been borderline elvated prev...  ~  labs 4/10 (wt=209#) showed BS= 113, A1c= 6.3.Marland Kitchen. rec> better diet, get wt down. ~  labs 4/11 (wt=222#) showed BS= 109, A1c= 6.2.Marland Kitchen. no meds yet, just needs to get wt down. ~  Labs 6/12 (wt=204#) showed BS= 102, A1v= 6.4 ~  Labs 6/13 (wt=213#) showed BS= 105, A1c= 6.4 ~  Labs 6/14 showed BS= 114, A1c= 6.6 ~  Labs 6/15 showed BS= 121, A1c= 6.4 ~  Labs remain under good control on diet alone...  OVERWEIGHT (ICD-278.02) - we discussed diet + exercise therapy (again)... ~  weight 4/09 = 226#.Marland KitchenMarland Kitchen 5" 9" Tall for a BMI= 33-34 ~  weight 4/10 = 209#... BMI down to 31... keep up the good work. ~  weight 4/11 = 222# .Marland KitchenMarland Kitchen BMI up to 33. ~  Weight 6/12 = 204# ~  Weight 6/13 = 213# ~  Weight 6/14 = 214# ~  Weight 6/15 = 218# ~  Weight 12/15 = 202# keep up the good work... ~  Encouraged re Diet/ Exercise/ etc...  GASTROESOPHAGEAL REFLUX DISEASE (ICD-530.81) w/ BARRETT'S ESOPH - followed by DrWeissman & Rx w/ OMEPRAZOLE 20mg Bid... ~  prev EGD's documented a [place] & Barrett's esoph... (last 1/03 by DrPatterson). ~  last EGD was 2/08 showing Barrett's epithelium in lower 1/3 of esoph... f/u planned 43yrs. ~  4/11:  NOTE> f/u EGD is due, he will decide on gastroenterologist ==> he saw DrMJohnson w/ EGD 5/11 showing Irreg Z-line &  bx c/w Barrett's esoph (no dysplasia), the rest of the esoph, stomach, & duodenum were WNL... ~  EGD 11/14 by DrPyrtle showed gastritis, esophagitis, gastric polyps (path=+Barretts, reflux related injury, neg HPylori).Marland KitchenMarland Kitchen  F/u planned 3 yrs.  DIVERTICULOSIS OF COLON (ICD-562.10)  IRRITABLE BOWEL SYNDROME (ICD-564.1) ~  last colonoscopy 2/08 by DrWeissman showed left sided divertics, otherw neg... f/u planned 75yrs... ~  Colonoscopy 11/14 by DrPyrtle showed mod divertics, & 2mm polyp in cecum (tub adenoma)... F/u planned 5 yrs if eligible.  BENIGN PROSTATIC HYPERTROPHY, WITH OBSTRUCTION (ICD-600.01) - on FLOMAX 0.4mg /d & PROSCAR 5mg /d... eval and Rx by [facility] who checks pt q45mo (we don't have notes from him)... pt tells me he will be seeing DrDavis regularly when he moves to [facility]. ~  9/13: pt reports that he is now seeing DrEskridge at [facility] & his PSAs have all been normal; his major prob has been holding his urine & eval revealed not emptying completely- they have tried several meds to help this; he will have notes sent to [place] for [facility] records.  OSTEOARTHRITIS (ICD-715.90) >> BACK PAIN (ICD-724.5) >>  ~  managed by DrAnderson & difficult problem, thought to have an inflamm arthropathy (?variation of psoriatic arthritis) however he did not respond to NSAIDs/Celebrex, Pred, MTX, anti-TNF inhibitor; on Tramadol prn;  ~  pt saw DrBeane 6/10 for eval> neuropathy symptoms & lumbar spondylosis on XRays, Rx Mobic Prn... offered Myelogram for further eval... he also takes Calcium & Vit D. ~  He was evaluated by [facility] Funk for Rheum => on Naprosyn, Tylenol; c/o hands and ankles stiff but he still plays golf etc; states that they released him "I've done all I can do for you, if you had RA I could help you"  FALL w/ SUBDURAL HEMATOMA >> treated conservatively, he had some resid HA, dizzy, etc & treated w/ Epley maneuvers- improved CT Head 4/15 showed mild atrophy & sm vessel dis & scat lacunes, prev  left parietal extra-axial fluid collection is resolved...   FALL w/ Left LEG TRAUMA & subsequent DVT w/ hypercoag panel showing   PERIPHERAL NEUROPATHY (ICD-356.9) - DrReynolds evaluated him w/ NCV's etc and dx a peripheral neuropathy... this is one of his CC> numbness, decr sensation in feet, (no pain) w/ some assoc balance problems intermittently...  he tried Mentanx but no benefit...  ~  4/11: I reviewed DrReynolds 11/10 note w/ the patient... ~  He also had a thorough neurologic evaluation 10/11 from one of his church physicians, DrHickling, for f/u polyneuropathy> balance issues, LBP, gait abn, variable paresthesias, foot numbness (sl decr vibration & pin prick), some subjective memory problems w/ norm MMSE, abnormal NCVs;  Prev Labs were all WNL, unrevealing;  etiology of his neuropathy is unknown= cryptogenic, and since it isn't painful he really didn't require any therapy... ~  6/13: he continues to experience prob w/ balance etc related to his neuropathy; not progressive & still not painful; also notes nocturnal leg cramps & he's tried bar of soap trick, mustard, etc- all w/o relief (we rec trial Tonic water)... ~  2015> Neuro follow up by DrAquino for [facility]...  Anemia , Iron deficient, & heme pos stools> started on FeSO4 + VitC and referred to  GI => EGD & Colon rechecked 9/18 w/ results above...    10/01/17>  Fe defic anemia resolved w/ Hg=14.8, mcv=96, Fe=93 (27%sat), and Ferritin=64; OK to stop Fe supplementation...   Health Maintenance - he takes a number of Vits + lecithin, saw palmetto, Fish Oil, etc... he saw DrDJones in 2010 for Derm review- pt reports nothing major found...   Past Surgical History:  Procedure Laterality Date  . APPENDECTOMY    . CHOLECYSTECTOMY    .  PACEMAKER PLACEMENT    . PPM GENERATOR CHANGEOUT N/A 05/21/2017   Procedure: PPM GENERATOR CHANGEOUT;  Surgeon: Deboraha Sprang, MD;  Location: [facility];  Service: Cardiovascular;  Laterality: N/A;  .  skin cancer removed  12/2012   removed from his face--Dr. Sarajane Jews  . TONSILLECTOMY      Outpatient Encounter Medications as of 07/13/2018  Medication Sig  . acetaminophen (TYLENOL) 500 MG tablet Take 500-1,000 mg by mouth every 6 (six) hours as needed for headache (or pain).   . calcium carbonate (OS-CAL) 600 MG TABS Take 600 mg by mouth daily.    . Cholecalciferol (VITAMIN D) 1000 UNITS capsule Take 1,000 Units by mouth daily.    . ferrous sulfate (FEOSOL) 325 (65 FE) MG tablet Take 1 tablet (325 mg total) by mouth daily with breakfast. Take with vit c 500 mg daily  . finasteride (PROSCAR) 5 MG tablet Take 1 tablet by mouth daily.  . Lecithin 1200 MG CAPS Take 1 capsule by mouth daily.    Marland Kitchen loratadine (CLARITIN) 10 MG tablet Take 10 mg by mouth daily.    Marland Kitchen losartan (COZAAR) 100 MG tablet Take 1 tablet (100 mg total) by mouth daily.  . Multiple Vitamin (MULTIVITAMIN) capsule Take 1 capsule by mouth daily.    . naproxen (NAPROSYN) 500 MG tablet TAKE 1 TABLET TWICE A DAY  . Omega-3 Fatty Acids (FISH OIL) 1200 MG CAPS Take 1,200 mg by mouth daily.  . pantoprazole (PROTONIX) 40 MG tablet TAKE ONE TABLET DAILY. MAKEAN APPOINTMENT FOR FURTHER REFILLS  . Tamsulosin HCl (FLOMAX) 0.4 MG CAPS Take 0.4 mg by mouth daily.    . traMADol (ULTRAM) 50 MG tablet Take 1 tablet (50 mg total) by mouth 3 (three) times daily.  . vitamin C (ASCORBIC ACID) 500 MG tablet Take 500 mg by mouth daily.  Alveda Reasons 15 MG TABS tablet TAKE 1 TABLET DAILY WITH   SUPPER  . [DISCONTINUED] hydrocortisone (ANUSOL-HC) 2.5 % rectal cream Place 1 application rectally 2 (two) times daily.  . chlorthalidone (HYGROTON) 25 MG tablet Take 1 tablet (25 mg total) by mouth daily. (Patient taking differently: Take 25 mg by mouth daily. 1/2 tab each morning)  . Probiotic Product (PROBIOTIC PO) Take 1 capsule by mouth daily.  . valACYclovir (VALTREX) 1000 MG tablet    Facility-Administered Encounter Medications as of 07/13/2018   Medication  . 0.9 %  sodium chloride infusion    Allergies  Allergen Reactions  . Shellfish Allergy Rash    Sensitive to shellfish    Immunization History  Administered Date(s) Administered  . 19-influenza Whole 04/09/2016  . Influenza Split 05/30/2011, 06/04/2012, 04/18/2016  . Influenza Whole 08/16/2009  . Influenza, High Dose Seasonal PF 04/29/2015, 05/12/2017  . Influenza,inj,Quad PF,6+ Mos 04/04/2014  . Influenza-Unspecified 05/29/2013, 05/04/2015  . Pneumococcal Conjugate-13 12/28/2013, 04/18/2016  . Rabies, IM 09/28/2012, 09/30/2012, 10/04/2012, 10/11/2012  . Rabies, intradermal 09/28/2012  . Tdap 06/04/2012  . Zoster 07/29/2008    Current Medications, Allergies, Past Medical History, Past Surgical History, Family History, and Social History were reviewed in [employer] record.   Review of Systems        The patient complains of sleep disorder, dyspnea on exertion, gas/bloating, joint pain, stiffness, arthritis, paresthesias, and difficulty walking.  The patient denies fever, chills, sweats, anorexia, fatigue, weakness, malaise, weight loss, blurring, diplopia, eye irritation, eye discharge, vision loss, eye pain, photophobia, earache, ear discharge, tinnitus, decreased hearing, nasal congestion, nosebleeds, sore throat, hoarseness, chest  pain, palpitations, syncope, orthopnea, PND, peripheral edema, cough, dyspnea at rest, excessive sputum, hemoptysis, wheezing, pleurisy, nausea, vomiting, diarrhea, constipation, change in bowel habits, abdominal pain, melena, hematochezia, jaundice, indigestion/heartburn, dysphagia, odynophagia, dysuria, hematuria, urinary frequency, urinary hesitancy, nocturia, incontinence, back pain, joint swelling, muscle cramps, muscle weakness, sciatica, restless legs, leg pain at night, leg pain with exertion, rash, itching, dryness, suspicious lesions, paralysis, seizures, tremors, vertigo, transient blindness, frequent falls,  frequent headaches, depression, anxiety, memory loss, confusion, cold intolerance, heat intolerance, polydipsia, polyphagia, polyuria, unusual weight change, abnormal bruising, bleeding, enlarged lymph nodes, urticaria, allergic rash, hay fever, and recurrent infections.     Objective:   Physical Exam     WD, WN, 84 y/o WM in NAD... GENERAL:  Alert & oriented; pleasant & cooperative... HEENT:  [place]/AT, EOM-wnl, PERRLA, EACs-clear, TMs-wnl, NOSE-clear, THROAT-clear & wnl. NECK:  Supple w/ fairROM; no JVD; normal carotid impulses w/o bruits; no thyromegaly or nodules palpated; no lymphadenopathy. CHEST:  Clear to P & A; without wheezes/ rales/ or rhonchi. HEART:  Regular Rhythm; without murmurs/ rubs/ or gallops; pacer in left shoulder area... ABDOMEN:  Soft & nontender; normal bowel sounds; no organomegaly or masses detected. EXT: without deformities, mild arthritic changes; no varicose veins/ +venous insuffic/ no edema. NEURO:  CN's intact;  fair gait;  Motor normal, sensory variable deficits... strength seems OK, min decr sensation in LEs. DERM:  No lesions noted; no rash etc...  RADIOLOGY DATA:  Reviewed in the EPIC EMR & discussed w/ the patient...  LABORATORY DATA:  Reviewed in the EPIC EMR & discussed w/ the patient...   Assessment & Plan:    FALL OOB w/ trauma 09/2017>   With Na=124 I have asked him to decr [place] to 1/2 tab=12.5mg /d w/ recheck BMet in 2wks;  With Hg up to 14.8 & Fe normal=> ok to stop FeSO4 7 VitC supplements;  I offered to refer to Neuro but symptoms are no worse than prev & not requiring meds;  We will check CT Head w/ his fall & subseq HA=> pending;  Continue other meds the same for now... ~  01/15/18:  Darcey is stable overall; we decreased his Chlorthalidone to 1/2 tab Qam, continue Losar100 & Xarelto15; Iron has been repleted & he can wean off the Iron supplement; we will plan rov recheck in 5-84mo prior to my planned retirement in 07/2018. ~  07/13/18:  Tony Cruz  remains stable on his current med regimen;  He has numerous medical specialists that he sees in rotation- Cards- DrKlein, GI- DrPyrtle, GU- DrEskridge, Ortho- DrCaffrey/ DrCollins, Neuro- DrAquino;  We discussed my up-coming retirement & we will set him up w/ Sleep Med eval in 1st half of 2020, and he will seek PCP w/ his wife's physician- [facility]  Prev followed by DrClance & stable on CPAP; he says he brought chip into [facility]...  HBP>  His BP is improved & stable on Cozaar & Amlodipine, continue same...  LEFT LEG DVT & ABN HYPERCOAG PANEL>  Factor V Leiden is POS for R506Q gene mutation heterozygote (increases thrombosis risk 4-8x) but Prothrombin gene mutation is NEG... 08/22/16>   Left leg DVT after fall & long carr ride- abnormal hypercoag panel- Factor V Leiden is POS for R506Q gene mutation heterozygote (increases thrombosis risk 4-8x) but Prothrombin gene mutation is NEG; on Xarelto now & tol well; rec support hose, no salt, elev legs and gradually incr exercise while avoiding unnecessary trauma.  Hx Syncope/ Pacer>  Followed by Vevelyn Francois &  pacer functioning normally, no further syncope or vasovagal episodes...  DM, borderline>  On diet alone & managing well, A1c remains 6.1 & we reviewed the import of wt reduction...  GERD, Hx Barrett's>  S/p EGD from DrPyrtle 2014 w/ small segment Barrett's mucosa, no dysplasia; now c/o hematochezia (we will check stools, Rx AnusolHC, & refer back to GI).  Divertics, IBS>  Colonoscopy 11/14 w/ 54mm tub adenoma removed...  BPH>  Followed by DrEskridge at [facility] on [address]; biggest issue is control & he says it was from not emptying completely, they have tried several meds and continue to follow up regularly, he reports PSAs have been ok; he will get notes to [place]...  DJD>  Followed by Kirby Funk & still having difficulty w/ pain & stiffness...  Periph Neuropathy>  Eval by Neuro, cryptogenic, no meds needed &  symptoms remain the same...  Anemia , Iron deficient, & heme pos stools> started on FeSO4 + VitC and referred to  GI => EGD & Colon rechecked 9/18 w/ results above...    10/01/17>  Fe defic anemia resolved w/ Hg=14.8, mcv=96, Fe=93 (27%sat), and Ferritin=64; OK to stop Fe supplementation...    Patient's Medications  New Prescriptions   No medications on file  Previous Medications   ACETAMINOPHEN (TYLENOL) 500 MG TABLET    Take 500-1,000 mg by mouth every 6 (six) hours as needed for headache (or pain).    CALCIUM CARBONATE (OS-CAL) 600 MG TABS    Take 600 mg by mouth daily.     CHLORTHALIDONE (HYGROTON) 25 MG TABLET    Take 1/2 tablet (12.5 mg total) by mouth daily.   CHOLECALCIFEROL (VITAMIN D) 1000 UNITS CAPSULE    Take 1,000 Units by mouth daily.      FINASTERIDE (PROSCAR) 5 MG TABLET    Take 1 tablet by mouth daily.   HYDROCORTISONE (ANUSOL-HC) 2.5 % RECTAL CREAM    Place 1 application rectally 2 (two) times daily.   KETOTIFEN FUMARATE OP    Place 1 application into both eyes at bedtime as needed (itchy/dry eyes).   LECITHIN 1200 MG CAPS    Take 1 capsule by mouth daily.     LORATADINE (CLARITIN) 10 MG TABLET    Take 10 mg by mouth daily.     LOSARTAN (COZAAR) 100 MG TABLET    Take 100 mg by mouth daily.   MULTIPLE VITAMIN (MULTIVITAMIN) CAPSULE    Take 1 capsule by mouth daily.     NAPROXEN (NAPROSYN) 500 MG TABLET    Take 500 mg by mouth 2 (two) times daily.    OMEGA-3 FATTY ACIDS (FISH OIL) 1200 MG CAPS    Take 1,200 mg by mouth daily.   PANTOPRAZOLE (PROTONIX) 40 MG TABLET    Take 1 tablet (40 mg total) by mouth daily.   PROBIOTIC PRODUCT (PROBIOTIC PO)    Take 1 capsule by mouth daily.   RIVAROXABAN (XARELTO) 15 MG TABS TABLET    Take 1 tablet (15 mg total) by mouth daily with supper.   TAMSULOSIN HCL (FLOMAX) 0.4 MG CAPS    Take 0.4 mg by mouth daily.     TRAMADOL (ULTRAM) 50 MG TABLET    Take 1 tablet (50 mg total) by mouth 3 (three) times daily.   Modified Medications   No  medications on file  Discontinued Medications   AMLODIPINE (NORVASC) 5 MG TABLET    Take 1 tablet (5 mg total) by mouth daily.   METOPROLOL SUCCINATE (TOPROL-XL) 50  MG 24 HR TABLET    Take 1 tablet (50 mg total) by mouth daily. Take with or immediately following a meal.

## 2018-07-28 LAB — CUP PACEART REMOTE DEVICE CHECK
Date Time Interrogation Session: 20191231085030
Implantable Lead Implant Date: 19990330
Implantable Lead Location: 753860
MDC IDC LEAD IMPLANT DT: 19990330
MDC IDC LEAD LOCATION: 753859
MDC IDC PG IMPLANT DT: 20181024
MDC IDC PG SERIAL: 69203177
Pulse Gen Model: 407145

## 2018-08-03 DIAGNOSIS — N138 Other obstructive and reflux uropathy: Secondary | ICD-10-CM | POA: Diagnosis not present

## 2018-08-03 DIAGNOSIS — N401 Enlarged prostate with lower urinary tract symptoms: Secondary | ICD-10-CM | POA: Diagnosis not present

## 2018-08-10 DIAGNOSIS — G4733 Obstructive sleep apnea (adult) (pediatric): Secondary | ICD-10-CM | POA: Diagnosis not present

## 2018-08-10 DIAGNOSIS — Z950 Presence of cardiac pacemaker: Secondary | ICD-10-CM | POA: Diagnosis not present

## 2018-08-10 DIAGNOSIS — D649 Anemia, unspecified: Secondary | ICD-10-CM | POA: Diagnosis not present

## 2018-08-10 DIAGNOSIS — G608 Other hereditary and idiopathic neuropathies: Secondary | ICD-10-CM | POA: Diagnosis not present

## 2018-08-10 DIAGNOSIS — I10 Essential (primary) hypertension: Secondary | ICD-10-CM | POA: Diagnosis not present

## 2018-08-10 DIAGNOSIS — Z86718 Personal history of other venous thrombosis and embolism: Secondary | ICD-10-CM | POA: Diagnosis not present

## 2018-08-10 DIAGNOSIS — M545 Low back pain: Secondary | ICD-10-CM | POA: Diagnosis not present

## 2018-08-10 DIAGNOSIS — R7309 Other abnormal glucose: Secondary | ICD-10-CM | POA: Diagnosis not present

## 2018-08-10 DIAGNOSIS — E871 Hypo-osmolality and hyponatremia: Secondary | ICD-10-CM | POA: Diagnosis not present

## 2018-08-10 DIAGNOSIS — N401 Enlarged prostate with lower urinary tract symptoms: Secondary | ICD-10-CM | POA: Diagnosis not present

## 2018-08-19 ENCOUNTER — Ambulatory Visit: Payer: Medicare HMO | Admitting: Internal Medicine

## 2018-08-19 ENCOUNTER — Encounter: Payer: Self-pay | Admitting: Internal Medicine

## 2018-08-19 VITALS — BP 120/82 | HR 80 | Ht 69.0 in | Wt 215.0 lb

## 2018-08-19 DIAGNOSIS — Z950 Presence of cardiac pacemaker: Secondary | ICD-10-CM | POA: Diagnosis not present

## 2018-08-19 DIAGNOSIS — R609 Edema, unspecified: Secondary | ICD-10-CM

## 2018-08-19 DIAGNOSIS — R55 Syncope and collapse: Secondary | ICD-10-CM | POA: Diagnosis not present

## 2018-08-19 DIAGNOSIS — I10 Essential (primary) hypertension: Secondary | ICD-10-CM | POA: Diagnosis not present

## 2018-08-19 DIAGNOSIS — Z7689 Persons encountering health services in other specified circumstances: Secondary | ICD-10-CM | POA: Diagnosis not present

## 2018-08-19 DIAGNOSIS — I495 Sick sinus syndrome: Secondary | ICD-10-CM | POA: Diagnosis not present

## 2018-08-19 MED ORDER — CHLORTHALIDONE 25 MG PO TABS: 25.0000 mg | ORAL_TABLET | Freq: Every day | ORAL | 3 refills | Status: DC

## 2018-08-19 NOTE — Progress Notes (Signed)
Patient Care Team: Velna Hatchet, MD as PCP - General (Internal Medicine)   HPI  Tony Cruz is a 85 y.o. male is seen in followup for syncope secondary to intermittent sinus node dysfunction for which a pacemaker was previously implanted and changed out 11/08, 10/18  He has had intercurrent syncope thought to be vasovagal   Date Cr K Hgb  6/19 0.86 4.3 14.5         No recent syncope.  Has had some lightheadedness with standing that abates after just a few moments.  He has noted on his blood pressure monitor some irregular heartbeats unassociated with symptoms.  No chest pain. Past Medical History:  Diagnosis Date  . Allergy   . Anal fissure   . Anemia   . Backache, unspecified   . Barrett's esophagus   . Diverticulosis of colon (without mention of hemorrhage)   . Esophageal reflux   . hypertension   . Hypertrophy of prostate with urinary obstruction and other lower urinary tract symptoms (LUTS)   . Irritable bowel syndrome   . Obstructive sleep apnea   . Osteoarthrosis, unspecified whether generalized or localized, unspecified site   . Other abnormal glucose   . Overweight(278.02)   . Pacemaker-MDT    Change out 2008  . Sleep apnea 2016   Uses cpap  . Syncope and collapse   . Tubular adenoma of colon   . Unspecified hereditary and idiopathic peripheral neuropathy     Past Surgical History:  Procedure Laterality Date  . APPENDECTOMY    . CHOLECYSTECTOMY    . PACEMAKER PLACEMENT    . PPM GENERATOR CHANGEOUT N/A 05/21/2017   Procedure: PPM GENERATOR CHANGEOUT;  Surgeon: Deboraha Sprang, MD;  Location: [facility];  Service: Cardiovascular;  Laterality: N/A;  . skin cancer removed  12/2012   removed from his face--Dr. Sarajane Jews  . TONSILLECTOMY      Current Outpatient Medications  Medication Sig Dispense Refill  . acetaminophen (TYLENOL) 500 MG tablet Take 500-1,000 mg by mouth every 6 (six) hours as needed for headache (or pain).     .  calcium carbonate (OS-CAL) 600 MG TABS Take 600 mg by mouth daily.      . chlorthalidone (HYGROTON) 25 MG tablet Take 1 tablet (25 mg total) by mouth daily. 90 tablet 3  . Cholecalciferol (VITAMIN D) 1000 UNITS capsule Take 1,000 Units by mouth daily.      . finasteride (PROSCAR) 5 MG tablet Take 1 tablet by mouth daily.    . Lecithin 1200 MG CAPS Take 1 capsule by mouth daily.      Marland Kitchen loratadine (CLARITIN) 10 MG tablet Take 10 mg by mouth daily.      Marland Kitchen losartan (COZAAR) 100 MG tablet Take 1 tablet (100 mg total) by mouth daily. 90 tablet 3  . Multiple Vitamin (MULTIVITAMIN) capsule Take 1 capsule by mouth daily.      . naproxen (NAPROSYN) 500 MG tablet TAKE 1 TABLET TWICE A DAY 180 tablet 3  . Omega-3 Fatty Acids (FISH OIL) 1200 MG CAPS Take 1,200 mg by mouth daily.    . pantoprazole (PROTONIX) 40 MG tablet TAKE ONE TABLET DAILY. MAKEAN APPOINTMENT FOR FURTHER REFILLS 90 tablet 3  . Probiotic Product (PROBIOTIC PO) Take 1 capsule by mouth daily.    . Tamsulosin HCl (FLOMAX) 0.4 MG CAPS Take 0.4 mg by mouth daily.      . traMADol (ULTRAM) 50 MG tablet Take 1 tablet (  50 mg total) by mouth 3 (three) times daily. 90 tablet 5  . XARELTO 15 MG TABS tablet TAKE 1 TABLET DAILY WITH   SUPPER 90 tablet 3   Current Facility-Administered Medications  Medication Dose Route Frequency Provider Last Rate Last Dose  . 0.9 %  sodium chloride infusion  500 mL Intravenous Continuous Pyrtle, Lajuan Lines, MD        Allergies  Allergen Reactions  . Shellfish Allergy Rash    Sensitive to shellfish    Review of Systems negative except from HPI and PMH  Physical Exam BP 120/82   Pulse 80   Ht 5\' 9"  (1.753 m)   Wt 215 lb (97.5 kg)   SpO2 94%   BMI 31.75 kg/m  Well developed and well nourished in no acute distress HENT normal E scleral and icterus clear Neck Supple JVP flat; carotids brisk and full Clear to ausculation  Regular rate and rhythm, no murmurs gallops or rub Soft with active bowel sounds No  clubbing cyanosis none Edema Alert and oriented, grossly normal motor and sensory function Skin Warm and Dry    ECG demonstrates  Atrial pacing 80 20/09/38 Otherwise normal   l  Assessment and plan  Syncope presumed neurally mediated  Sinus node dysfunction-prob neurally   Pacemaker The patient's device was interrogated.  The information was reviewed. No changes were made in the programming.     Hypertension   Balance Neuropathy  Blood pressure is somewhat low.  Suggested that he keep track of his home blood pressures and see whether there is any need to down titrate his medications.  No interval syncope.  Device function normal.

## 2018-08-19 NOTE — Patient Instructions (Addendum)
Medication Instructions:  Your physician recommends that you continue on your current medications as directed. Please refer to the Current Medication list given to you today.  Labwork: You will have labs drawn today:   CBC, BMP  Testing/Procedures: None ordered.  Follow-Up: Your physician recommends that you schedule a follow-up appointment in:   One Year with Dr Caryl Comes  Any Other Special Instructions Will Be Listed Below (If Applicable).     If you need a refill on your cardiac medications before your next appointment, please call your pharmacy.

## 2018-08-20 LAB — CUP PACEART INCLINIC DEVICE CHECK
Date Time Interrogation Session: 20200122212000
Implantable Lead Implant Date: 19990330
Implantable Lead Implant Date: 19990330
Implantable Lead Location: 753859
Implantable Lead Location: 753860
Implantable Lead Model: 5092
Implantable Pulse Generator Implant Date: 20181024
Lead Channel Pacing Threshold Amplitude: 0.7 V
Lead Channel Pacing Threshold Amplitude: 0.7 V
Lead Channel Pacing Threshold Amplitude: 0.9 V
Lead Channel Pacing Threshold Amplitude: 0.9 V
Lead Channel Pacing Threshold Pulse Width: 0.4 ms
Lead Channel Pacing Threshold Pulse Width: 0.4 ms
Lead Channel Pacing Threshold Pulse Width: 0.4 ms
Lead Channel Pacing Threshold Pulse Width: 0.4 ms
Lead Channel Setting Pacing Amplitude: 2 V
Lead Channel Setting Pacing Amplitude: 2.4 V
Lead Channel Setting Pacing Pulse Width: 0.4 ms
MDC IDC MSMT LEADCHNL RA IMPEDANCE VALUE: 370 Ohm
MDC IDC MSMT LEADCHNL RA PACING THRESHOLD AMPLITUDE: 0.7 V
MDC IDC MSMT LEADCHNL RA PACING THRESHOLD PULSEWIDTH: 0.4 ms
MDC IDC MSMT LEADCHNL RA SENSING INTR AMPL: 1.8 mV
MDC IDC MSMT LEADCHNL RA SENSING INTR AMPL: 2.4 mV
MDC IDC MSMT LEADCHNL RV IMPEDANCE VALUE: 546 Ohm
MDC IDC MSMT LEADCHNL RV PACING THRESHOLD AMPLITUDE: 0.9 V
MDC IDC MSMT LEADCHNL RV PACING THRESHOLD PULSEWIDTH: 0.4 ms
MDC IDC MSMT LEADCHNL RV SENSING INTR AMPL: 9.4 mV
MDC IDC MSMT LEADCHNL RV SENSING INTR AMPL: 9.5 mV
MDC IDC PG SERIAL: 69203177

## 2018-08-20 LAB — CBC
HEMOGLOBIN: 13 g/dL (ref 13.0–17.7)
Hematocrit: 37.6 % (ref 37.5–51.0)
MCH: 32.7 pg (ref 26.6–33.0)
MCHC: 34.6 g/dL (ref 31.5–35.7)
MCV: 95 fL (ref 79–97)
Platelets: 332 10*3/uL (ref 150–450)
RBC: 3.97 x10E6/uL — ABNORMAL LOW (ref 4.14–5.80)
RDW: 13.1 % (ref 11.6–15.4)
WBC: 12.9 10*3/uL — ABNORMAL HIGH (ref 3.4–10.8)

## 2018-08-20 LAB — BASIC METABOLIC PANEL
BUN/Creatinine Ratio: 17 (ref 10–24)
BUN: 13 mg/dL (ref 8–27)
CALCIUM: 9.2 mg/dL (ref 8.6–10.2)
CO2: 25 mmol/L (ref 20–29)
Chloride: 93 mmol/L — ABNORMAL LOW (ref 96–106)
Creatinine, Ser: 0.77 mg/dL (ref 0.76–1.27)
GFR, EST AFRICAN AMERICAN: 98 mL/min/{1.73_m2} (ref >59)
GFR, EST NON AFRICAN AMERICAN: 85 mL/min/{1.73_m2} (ref >59)
Glucose: 89 mg/dL (ref 65–99)
POTASSIUM: 4.3 mmol/L (ref 3.5–5.2)
Sodium: 134 mmol/L (ref 134–144)

## 2018-08-25 ENCOUNTER — Ambulatory Visit (INDEPENDENT_AMBULATORY_CARE_PROVIDER_SITE_OTHER): Payer: Medicare HMO

## 2018-08-25 DIAGNOSIS — I495 Sick sinus syndrome: Secondary | ICD-10-CM

## 2018-08-26 NOTE — Progress Notes (Signed)
Remote pacemaker transmission.   

## 2018-08-28 LAB — CUP PACEART REMOTE DEVICE CHECK
Date Time Interrogation Session: 20200131061249
Implantable Lead Model: 5092
Implantable Pulse Generator Implant Date: 20181024
MDC IDC LEAD IMPLANT DT: 19990330
MDC IDC LEAD IMPLANT DT: 19990330
MDC IDC LEAD LOCATION: 753859
MDC IDC LEAD LOCATION: 753860
MDC IDC PG SERIAL: 69203177

## 2018-09-07 DIAGNOSIS — R69 Illness, unspecified: Secondary | ICD-10-CM | POA: Diagnosis not present

## 2018-09-08 DIAGNOSIS — G4733 Obstructive sleep apnea (adult) (pediatric): Secondary | ICD-10-CM | POA: Diagnosis not present

## 2018-09-11 DIAGNOSIS — G4733 Obstructive sleep apnea (adult) (pediatric): Secondary | ICD-10-CM | POA: Diagnosis not present

## 2018-09-15 ENCOUNTER — Ambulatory Visit: Payer: Medicare HMO | Admitting: Sports Medicine

## 2018-09-15 ENCOUNTER — Encounter: Payer: Self-pay | Admitting: Sports Medicine

## 2018-09-15 DIAGNOSIS — B351 Tinea unguium: Secondary | ICD-10-CM | POA: Diagnosis not present

## 2018-09-15 DIAGNOSIS — M79674 Pain in right toe(s): Secondary | ICD-10-CM

## 2018-09-15 DIAGNOSIS — G629 Polyneuropathy, unspecified: Secondary | ICD-10-CM

## 2018-09-15 DIAGNOSIS — M2041 Other hammer toe(s) (acquired), right foot: Secondary | ICD-10-CM

## 2018-09-15 DIAGNOSIS — M79675 Pain in left toe(s): Secondary | ICD-10-CM

## 2018-09-15 DIAGNOSIS — D689 Coagulation defect, unspecified: Secondary | ICD-10-CM

## 2018-09-15 DIAGNOSIS — M2042 Other hammer toe(s) (acquired), left foot: Secondary | ICD-10-CM

## 2018-09-15 NOTE — Progress Notes (Signed)
Subjective: IVEN EARNHART is a 85 y.o. male patient seen today in office with complaint of mildly painful thickened and elongated toenails; unable to trim. Denies any changes with meds, still on Xarelto like before. Patient has no other pedal complaints at this time.   Patient Active Problem List   Diagnosis Date Noted  . Pain in right knee 12/30/2017  . Anemia, iron deficiency 12/11/2017  . Toe ulcer ([place]) 01/13/2017  . Heterozygous factor V Leiden mutation ([place]) 01/13/2017  . DVT (deep venous thrombosis) ([place]) 08/22/2016  . History of skull fracture 10/19/2015  . History of subdural hematoma 10/19/2015  . Headache(784.0) 09/30/2013  . Sinoatrial node dysfunction ([facility]) 06/01/2013  . Pacemaker-MDT 07/02/2011  . Night sweats 07/02/2011  . DIZZINESS 03/22/2010  . Impaired fasting glucose 11/02/2009  . Obstructive sleep apnea 11/21/2008  . Backache 11/03/2008  . Overweight 11/02/2007  . Essential hypertension 11/02/2007  . ALLERGIC RHINITIS 11/02/2007  . GASTROESOPHAGEAL REFLUX DISEASE 11/02/2007  . Diverticulosis of large intestine 11/02/2007  . Benign prostatic hyperplasia with urinary obstruction 11/02/2007  . SYNCOPE 11/02/2007  . Hereditary and idiopathic peripheral neuropathy 10/30/2007  . IRRITABLE BOWEL SYNDROME 10/30/2007  . Osteoarthritis 10/30/2007    Current Outpatient Medications on File Prior to Visit  Medication Sig Dispense Refill  . acetaminophen (TYLENOL) 500 MG tablet Take 500-1,000 mg by mouth every 6 (six) hours as needed for headache (or pain).     Marland Kitchen amLODipine (NORVASC) 5 MG tablet     . calcium carbonate (OS-CAL) 600 MG TABS Take 600 mg by mouth daily.      . chlorthalidone (HYGROTON) 25 MG tablet Take 1 tablet (25 mg total) by mouth daily. 90 tablet 3  . Cholecalciferol (VITAMIN D) 1000 UNITS capsule Take 1,000 Units by mouth daily.      . finasteride (PROSCAR) 5 MG tablet Take 1 tablet by mouth daily.    . Lecithin 1200 MG CAPS Take 1 capsule by  mouth daily.      Marland Kitchen loratadine (CLARITIN) 10 MG tablet Take 10 mg by mouth daily.      Marland Kitchen losartan (COZAAR) 100 MG tablet Take 1 tablet (100 mg total) by mouth daily. 90 tablet 3  . Multiple Vitamin (MULTIVITAMIN) capsule Take 1 capsule by mouth daily.      . naproxen (NAPROSYN) 500 MG tablet TAKE 1 TABLET TWICE A DAY 180 tablet 3  . Omega-3 Fatty Acids (FISH OIL) 1200 MG CAPS Take 1,200 mg by mouth daily.    . pantoprazole (PROTONIX) 40 MG tablet TAKE ONE TABLET DAILY. MAKEAN APPOINTMENT FOR FURTHER REFILLS 90 tablet 3  . Probiotic Product (PROBIOTIC PO) Take 1 capsule by mouth daily.    . Tamsulosin HCl (FLOMAX) 0.4 MG CAPS Take 0.4 mg by mouth daily.      . traMADol (ULTRAM) 50 MG tablet Take 1 tablet (50 mg total) by mouth 3 (three) times daily. 90 tablet 5  . XARELTO 15 MG TABS tablet TAKE 1 TABLET DAILY WITH   SUPPER 90 tablet 3   Current Facility-Administered Medications on File Prior to Visit  Medication Dose Route Frequency Provider Last Rate Last Dose  . 0.9 %  sodium chloride infusion  500 mL Intravenous Continuous Pyrtle, Lajuan Lines, MD        Allergies  Allergen Reactions  . Shellfish Allergy Rash    Sensitive to shellfish    Objective: Physical Exam  General: Well developed, nourished, no acute distress, awake, alert and oriented x 3  Vascular: Dorsalis pedis artery 1/4 bilateral, Posterior tibial artery 1/4 bilateral, skin temperature warm to warm proximal to distal bilateral lower extremities, + varicosities, pedal hair present bilateral.  Neurological: Gross sensation present via light touch bilateral.   Dermatological: Skin is warm, dry, and supple bilateral, Nails 1-10 are tender, long, thick, and discolored with mild subungal debris, no webspace macerations present bilateral, no open lesions present bilateral, no callus/corns/hyperkeratotic tissue present bilateral. No signs of infection bilateral.  Musculoskeletal:  Asymptomatic claw/hammertoes boney deformities noted  bilateral. Muscular strength within normal limits without painon range of motion. No pain with calf compression bilateral.  Assessment and Plan:  Problem List Items Addressed This Visit    None    Visit Diagnoses    Pain due to onychomycosis of toenails of both feet    -  Primary   Hammer toes of both feet       Neuropathy       Coagulation defect ([place])         -Examined patient.  -Discussed treatment options for painful mycotic nails. -Mechanically debrided and reduced mycotic nails with sterile nail nipper and dremel nail file without incident. -Patient to return in 3 months for follow up evaluation or sooner if symptoms worsen.  Landis Martins, DPM

## 2018-10-04 ENCOUNTER — Other Ambulatory Visit: Payer: Self-pay | Admitting: Pulmonary Disease

## 2018-10-19 ENCOUNTER — Ambulatory Visit: Payer: Medicare HMO | Admitting: Pulmonary Disease

## 2018-10-27 DIAGNOSIS — G4733 Obstructive sleep apnea (adult) (pediatric): Secondary | ICD-10-CM | POA: Diagnosis not present

## 2018-10-29 DIAGNOSIS — M25512 Pain in left shoulder: Secondary | ICD-10-CM | POA: Diagnosis not present

## 2018-10-29 DIAGNOSIS — B029 Zoster without complications: Secondary | ICD-10-CM | POA: Diagnosis not present

## 2018-10-29 DIAGNOSIS — I10 Essential (primary) hypertension: Secondary | ICD-10-CM | POA: Diagnosis not present

## 2018-10-29 DIAGNOSIS — D6851 Activated protein C resistance: Secondary | ICD-10-CM | POA: Diagnosis not present

## 2018-11-23 ENCOUNTER — Ambulatory Visit: Payer: Medicare HMO | Admitting: Pulmonary Disease

## 2018-11-24 ENCOUNTER — Other Ambulatory Visit: Payer: Self-pay

## 2018-11-24 ENCOUNTER — Ambulatory Visit (INDEPENDENT_AMBULATORY_CARE_PROVIDER_SITE_OTHER): Payer: Medicare HMO | Admitting: *Deleted

## 2018-11-24 DIAGNOSIS — R55 Syncope and collapse: Secondary | ICD-10-CM

## 2018-11-24 LAB — CUP PACEART REMOTE DEVICE CHECK
Date Time Interrogation Session: 20200428163803
Implantable Lead Implant Date: 19990330
Implantable Lead Implant Date: 19990330
Implantable Lead Location: 753859
Implantable Lead Location: 753860
Implantable Lead Model: 5068
Implantable Lead Model: 5092
Implantable Pulse Generator Implant Date: 20181024
Pulse Gen Model: 407145
Pulse Gen Serial Number: 69203177

## 2018-12-03 NOTE — Progress Notes (Signed)
Remote pacemaker transmission.   

## 2018-12-08 ENCOUNTER — Encounter: Payer: Self-pay | Admitting: *Deleted

## 2018-12-15 ENCOUNTER — Other Ambulatory Visit: Payer: Self-pay | Admitting: Pulmonary Disease

## 2018-12-15 ENCOUNTER — Ambulatory Visit: Payer: Medicare HMO | Admitting: Sports Medicine

## 2018-12-16 NOTE — Telephone Encounter (Signed)
Spoke with patient to advise that we will do our best to reduce his scheduled transmissions to every 6 months instead of every 3 months (approved by [name]). Explained that due to clinic protocol of scheduling every 91 days, it may get off track. Pt verbalizes understanding and agreement with plan. He denies questions or concerns at this time.  Next remote moved to 05/26/19, appointment notes updated.

## 2018-12-22 ENCOUNTER — Other Ambulatory Visit: Payer: Self-pay

## 2018-12-22 ENCOUNTER — Ambulatory Visit: Payer: Medicare HMO | Admitting: Sports Medicine

## 2018-12-22 ENCOUNTER — Encounter: Payer: Self-pay | Admitting: Sports Medicine

## 2018-12-22 VITALS — Temp 97.3°F

## 2018-12-22 DIAGNOSIS — M79675 Pain in left toe(s): Secondary | ICD-10-CM

## 2018-12-22 DIAGNOSIS — G629 Polyneuropathy, unspecified: Secondary | ICD-10-CM

## 2018-12-22 DIAGNOSIS — B351 Tinea unguium: Secondary | ICD-10-CM | POA: Diagnosis not present

## 2018-12-22 DIAGNOSIS — M79674 Pain in right toe(s): Secondary | ICD-10-CM

## 2018-12-22 DIAGNOSIS — D689 Coagulation defect, unspecified: Secondary | ICD-10-CM

## 2018-12-22 NOTE — Progress Notes (Signed)
Subjective: Tony Cruz is a 85 y.o. male patient seen today in office with complaint of mildly painful thickened and elongated toenails; unable to trim. Denies any changes with meds, still on Xarelto like before no other acute issues noted.  Patient Active Problem List   Diagnosis Date Noted  . Pain in right knee 12/30/2017  . Anemia, iron deficiency 12/11/2017  . Toe ulcer ([place]) 01/13/2017  . Heterozygous factor V Leiden mutation ([place]) 01/13/2017  . DVT (deep venous thrombosis) ([place]) 08/22/2016  . History of skull fracture 10/19/2015  . History of subdural hematoma 10/19/2015  . Headache(784.0) 09/30/2013  . Sinoatrial node dysfunction ([facility]) 06/01/2013  . Pacemaker-MDT 07/02/2011  . Night sweats 07/02/2011  . DIZZINESS 03/22/2010  . Impaired fasting glucose 11/02/2009  . Obstructive sleep apnea 11/21/2008  . Backache 11/03/2008  . Overweight 11/02/2007  . Essential hypertension 11/02/2007  . ALLERGIC RHINITIS 11/02/2007  . GASTROESOPHAGEAL REFLUX DISEASE 11/02/2007  . Diverticulosis of large intestine 11/02/2007  . Benign prostatic hyperplasia with urinary obstruction 11/02/2007  . SYNCOPE 11/02/2007  . Hereditary and idiopathic peripheral neuropathy 10/30/2007  . IRRITABLE BOWEL SYNDROME 10/30/2007  . Osteoarthritis 10/30/2007    Current Outpatient Medications on File Prior to Visit  Medication Sig Dispense Refill  . acetaminophen (TYLENOL) 500 MG tablet Take 500-1,000 mg by mouth every 6 (six) hours as needed for headache (or pain).     Marland Kitchen amLODipine (NORVASC) 5 MG tablet     . calcium carbonate (OS-CAL) 600 MG TABS Take 600 mg by mouth daily.      . Cholecalciferol (VITAMIN D) 1000 UNITS capsule Take 1,000 Units by mouth daily.      . finasteride (PROSCAR) 5 MG tablet Take 1 tablet by mouth daily.    . Lecithin 1200 MG CAPS Take 1 capsule by mouth daily.      Marland Kitchen loratadine (CLARITIN) 10 MG tablet Take 10 mg by mouth daily.      Marland Kitchen losartan (COZAAR) 100 MG tablet  Take 1 tablet (100 mg total) by mouth daily. 90 tablet 3  . Multiple Vitamin (MULTIVITAMIN) capsule Take 1 capsule by mouth daily.      . naproxen (NAPROSYN) 500 MG tablet TAKE 1 TABLET TWICE A DAY 180 tablet 3  . Omega-3 Fatty Acids (FISH OIL) 1200 MG CAPS Take 1,200 mg by mouth daily.    . pantoprazole (PROTONIX) 40 MG tablet TAKE ONE TABLET DAILY. MAKEAN APPOINTMENT FOR FURTHER REFILLS 90 tablet 3  . Probiotic Product (PROBIOTIC PO) Take 1 capsule by mouth daily.    . Tamsulosin HCl (FLOMAX) 0.4 MG CAPS Take 0.4 mg by mouth daily.      . traMADol (ULTRAM) 50 MG tablet Take 1 tablet (50 mg total) by mouth 3 (three) times daily. 90 tablet 5  . XARELTO 15 MG TABS tablet TAKE 1 TABLET DAILY WITH   SUPPER 90 tablet 0  . chlorthalidone (HYGROTON) 25 MG tablet Take 1 tablet (25 mg total) by mouth daily. 90 tablet 3   Current Facility-Administered Medications on File Prior to Visit  Medication Dose Route Frequency Provider Last Rate Last Dose  . 0.9 %  sodium chloride infusion  500 mL Intravenous Continuous Pyrtle, Lajuan Lines, MD        Allergies  Allergen Reactions  . Shellfish Allergy Rash    Sensitive to shellfish    Objective: Physical Exam  General: Well developed, nourished, no acute distress, awake, alert and oriented x 3  Vascular: Dorsalis pedis artery  1/4 bilateral, Posterior tibial artery 1/4 bilateral, skin temperature warm to warm proximal to distal bilateral lower extremities, + varicosities, pedal hair present bilateral.  Neurological: Gross sensation present via light touch bilateral.   Dermatological: Skin is warm, dry, and supple bilateral, Nails 1-10 are tender, long, thick, and discolored with mild subungal debris, no webspace macerations present bilateral, no open lesions present bilateral, no callus/corns/hyperkeratotic tissue present bilateral. No signs of infection bilateral.  Musculoskeletal:  Asymptomatic claw/hammertoes boney deformities noted bilateral. Muscular  strength within normal limits without painon range of motion. No pain with calf compression bilateral.  Assessment and Plan:  Problem List Items Addressed This Visit    None    Visit Diagnoses    Pain due to onychomycosis of toenails of both feet    -  Primary   Neuropathy       Coagulation defect ([place])         -Examined patient.  -Discussed treatment options for painful mycotic nails. -Mechanically debrided and reduced mycotic nails with sterile nail nipper and dremel nail file without incident. -Recommend continue with good supportive shoes daily for foot type -Patient to return in 3 months for follow up evaluation or sooner if symptoms worsen.  Landis Martins, DPM

## 2019-01-04 DIAGNOSIS — L918 Other hypertrophic disorders of the skin: Secondary | ICD-10-CM | POA: Diagnosis not present

## 2019-01-04 DIAGNOSIS — Z85828 Personal history of other malignant neoplasm of skin: Secondary | ICD-10-CM | POA: Diagnosis not present

## 2019-01-04 DIAGNOSIS — L570 Actinic keratosis: Secondary | ICD-10-CM | POA: Diagnosis not present

## 2019-01-04 DIAGNOSIS — L821 Other seborrheic keratosis: Secondary | ICD-10-CM | POA: Diagnosis not present

## 2019-01-04 DIAGNOSIS — D1801 Hemangioma of skin and subcutaneous tissue: Secondary | ICD-10-CM | POA: Diagnosis not present

## 2019-01-04 DIAGNOSIS — L720 Epidermal cyst: Secondary | ICD-10-CM | POA: Diagnosis not present

## 2019-01-11 DIAGNOSIS — R69 Illness, unspecified: Secondary | ICD-10-CM | POA: Diagnosis not present

## 2019-01-19 DIAGNOSIS — R509 Fever, unspecified: Secondary | ICD-10-CM | POA: Diagnosis not present

## 2019-01-19 DIAGNOSIS — Z7689 Persons encountering health services in other specified circumstances: Secondary | ICD-10-CM | POA: Diagnosis not present

## 2019-02-01 DIAGNOSIS — R69 Illness, unspecified: Secondary | ICD-10-CM | POA: Diagnosis not present

## 2019-02-09 ENCOUNTER — Other Ambulatory Visit: Payer: Self-pay | Admitting: Pulmonary Disease

## 2019-02-09 DIAGNOSIS — R82998 Other abnormal findings in urine: Secondary | ICD-10-CM | POA: Diagnosis not present

## 2019-02-09 DIAGNOSIS — R7309 Other abnormal glucose: Secondary | ICD-10-CM | POA: Diagnosis not present

## 2019-02-09 DIAGNOSIS — Z79899 Other long term (current) drug therapy: Secondary | ICD-10-CM | POA: Diagnosis not present

## 2019-02-09 DIAGNOSIS — I10 Essential (primary) hypertension: Secondary | ICD-10-CM | POA: Diagnosis not present

## 2019-02-09 DIAGNOSIS — R7989 Other specified abnormal findings of blood chemistry: Secondary | ICD-10-CM | POA: Diagnosis not present

## 2019-02-16 DIAGNOSIS — D649 Anemia, unspecified: Secondary | ICD-10-CM | POA: Diagnosis not present

## 2019-02-16 DIAGNOSIS — N401 Enlarged prostate with lower urinary tract symptoms: Secondary | ICD-10-CM | POA: Diagnosis not present

## 2019-02-16 DIAGNOSIS — R7309 Other abnormal glucose: Secondary | ICD-10-CM | POA: Diagnosis not present

## 2019-02-16 DIAGNOSIS — D6851 Activated protein C resistance: Secondary | ICD-10-CM | POA: Diagnosis not present

## 2019-02-16 DIAGNOSIS — Z86718 Personal history of other venous thrombosis and embolism: Secondary | ICD-10-CM | POA: Diagnosis not present

## 2019-02-16 DIAGNOSIS — I10 Essential (primary) hypertension: Secondary | ICD-10-CM | POA: Diagnosis not present

## 2019-02-16 DIAGNOSIS — D692 Other nonthrombocytopenic purpura: Secondary | ICD-10-CM | POA: Diagnosis not present

## 2019-02-16 DIAGNOSIS — R7989 Other specified abnormal findings of blood chemistry: Secondary | ICD-10-CM | POA: Diagnosis not present

## 2019-02-16 DIAGNOSIS — Z0000 Encounter for general adult medical examination without abnormal findings: Secondary | ICD-10-CM | POA: Diagnosis not present

## 2019-02-16 DIAGNOSIS — K219 Gastro-esophageal reflux disease without esophagitis: Secondary | ICD-10-CM | POA: Diagnosis not present

## 2019-02-16 DIAGNOSIS — G4733 Obstructive sleep apnea (adult) (pediatric): Secondary | ICD-10-CM | POA: Diagnosis not present

## 2019-02-18 DIAGNOSIS — Z23 Encounter for immunization: Secondary | ICD-10-CM | POA: Diagnosis not present

## 2019-03-12 DIAGNOSIS — Z1212 Encounter for screening for malignant neoplasm of rectum: Secondary | ICD-10-CM | POA: Diagnosis not present

## 2019-03-15 ENCOUNTER — Other Ambulatory Visit: Payer: Self-pay | Admitting: Pulmonary Disease

## 2019-03-23 ENCOUNTER — Ambulatory Visit: Payer: Medicare HMO | Admitting: Sports Medicine

## 2019-03-23 ENCOUNTER — Other Ambulatory Visit: Payer: Self-pay

## 2019-03-23 ENCOUNTER — Encounter: Payer: Self-pay | Admitting: Sports Medicine

## 2019-03-23 VITALS — Temp 97.9°F

## 2019-03-23 DIAGNOSIS — G629 Polyneuropathy, unspecified: Secondary | ICD-10-CM | POA: Diagnosis not present

## 2019-03-23 DIAGNOSIS — M79675 Pain in left toe(s): Secondary | ICD-10-CM | POA: Diagnosis not present

## 2019-03-23 DIAGNOSIS — B351 Tinea unguium: Secondary | ICD-10-CM | POA: Diagnosis not present

## 2019-03-23 DIAGNOSIS — D689 Coagulation defect, unspecified: Secondary | ICD-10-CM | POA: Diagnosis not present

## 2019-03-23 DIAGNOSIS — M79674 Pain in right toe(s): Secondary | ICD-10-CM

## 2019-03-23 NOTE — Progress Notes (Signed)
Subjective: Tony Cruz is a 85 y.o. male patient seen today in office with complaint of mildly painful thickened and elongated toenails; unable to trim. Denies any changes with meds, still on Xarelto like before no new concerns.   Patient Active Problem List   Diagnosis Date Noted  . Pain in right knee 12/30/2017  . Anemia, iron deficiency 12/11/2017  . Toe ulcer ([place]) 01/13/2017  . Heterozygous factor V Leiden mutation ([place]) 01/13/2017  . DVT (deep venous thrombosis) ([place]) 08/22/2016  . History of skull fracture 10/19/2015  . History of subdural hematoma 10/19/2015  . Headache(784.0) 09/30/2013  . Sinoatrial node dysfunction (HCC) 06/01/2013  . Pacemaker-MDT 07/02/2011  . Night sweats 07/02/2011  . DIZZINESS 03/22/2010  . Impaired fasting glucose 11/02/2009  . Obstructive sleep apnea 11/21/2008  . Backache 11/03/2008  . Overweight 11/02/2007  . Essential hypertension 11/02/2007  . ALLERGIC RHINITIS 11/02/2007  . GASTROESOPHAGEAL REFLUX DISEASE 11/02/2007  . Diverticulosis of large intestine 11/02/2007  . Benign prostatic hyperplasia with urinary obstruction 11/02/2007  . SYNCOPE 11/02/2007  . Hereditary and idiopathic peripheral neuropathy 10/30/2007  . IRRITABLE BOWEL SYNDROME 10/30/2007  . Osteoarthritis 10/30/2007    Current Outpatient Medications on File Prior to Visit  Medication Sig Dispense Refill  . acetaminophen (TYLENOL) 500 MG tablet Take 500-1,000 mg by mouth every 6 (six) hours as needed for headache (or pain).     Marland Kitchen amLODipine (NORVASC) 5 MG tablet     . calcium carbonate (OS-CAL) 600 MG TABS Take 600 mg by mouth daily.      . Cholecalciferol (VITAMIN D) 1000 UNITS capsule Take 1,000 Units by mouth daily.      . finasteride (PROSCAR) 5 MG tablet Take 1 tablet by mouth daily.    . Lecithin 1200 MG CAPS Take 1 capsule by mouth daily.      Marland Kitchen loratadine (CLARITIN) 10 MG tablet Take 10 mg by mouth daily.      Marland Kitchen losartan (COZAAR) 100 MG tablet Take 1 tablet (100  mg total) by mouth daily. 90 tablet 3  . Multiple Vitamin (MULTIVITAMIN) capsule Take 1 capsule by mouth daily.      . naproxen (NAPROSYN) 500 MG tablet TAKE 1 TABLET TWICE A DAY 180 tablet 3  . Omega-3 Fatty Acids (FISH OIL) 1200 MG CAPS Take 1,200 mg by mouth daily.    . pantoprazole (PROTONIX) 40 MG tablet TAKE ONE TABLET DAILY. MAKEAN APPOINTMENT FOR FURTHER REFILLS 90 tablet 3  . Probiotic Product (PROBIOTIC PO) Take 1 capsule by mouth daily.    . Tamsulosin HCl (FLOMAX) 0.4 MG CAPS Take 0.4 mg by mouth daily.      . traMADol (ULTRAM) 50 MG tablet Take 1 tablet (50 mg total) by mouth 3 (three) times daily. 90 tablet 5  . XARELTO 15 MG TABS tablet TAKE 1 TABLET DAILY WITH   SUPPER 90 tablet 0  . chlorthalidone (HYGROTON) 25 MG tablet Take 1 tablet (25 mg total) by mouth daily. 90 tablet 3   Current Facility-Administered Medications on File Prior to Visit  Medication Dose Route Frequency Provider Last Rate Last Dose  . 0.9 %  sodium chloride infusion  500 mL Intravenous Continuous Pyrtle, Lajuan Lines, MD        Allergies  Allergen Reactions  . Shellfish Allergy Rash    Sensitive to shellfish    Objective: Physical Exam  General: Well developed, nourished, no acute distress, awake, alert and oriented x 3  Vascular: Dorsalis pedis artery 1/4 bilateral, Posterior  tibial artery 0/4 bilateral, skin temperature warm to warm proximal to distal bilateral lower extremities due to trace edema, + varicosities, pedal hair present bilateral.  Neurological: Gross sensation present via light touch bilateral.   Dermatological: Skin is warm, dry, and supple bilateral, Nails 1-10 are tender, long, thick, and discolored with mild subungal debris, no webspace macerations present bilateral, no open lesions present bilateral, no callus/corns/hyperkeratotic tissue present bilateral. No signs of infection bilateral.  Musculoskeletal:  Asymptomatic claw/hammertoes boney deformities noted bilateral. Muscular  strength within normal limits without painon range of motion. No pain with calf compression bilateral.  Assessment and Plan:  Problem List Items Addressed This Visit    None    Visit Diagnoses    Pain due to onychomycosis of toenails of both feet    -  Primary   Neuropathy       Coagulation defect ([place])         -Examined patient.  -Discussed treatment options for painful mycotic nails. -Mechanically debrided and reduced mycotic nails with sterile nail nipper and dremel nail file without incident. -Recommend daily inspection in setting of neuropathy -Patient to return in 3 months for follow up evaluation or sooner if symptoms worsen.  Landis Martins, DPM

## 2019-04-10 DIAGNOSIS — Z23 Encounter for immunization: Secondary | ICD-10-CM | POA: Diagnosis not present

## 2019-04-27 DIAGNOSIS — Z85828 Personal history of other malignant neoplasm of skin: Secondary | ICD-10-CM | POA: Diagnosis not present

## 2019-04-27 DIAGNOSIS — L720 Epidermal cyst: Secondary | ICD-10-CM | POA: Diagnosis not present

## 2019-04-27 DIAGNOSIS — L570 Actinic keratosis: Secondary | ICD-10-CM | POA: Diagnosis not present

## 2019-05-03 DIAGNOSIS — G4733 Obstructive sleep apnea (adult) (pediatric): Secondary | ICD-10-CM | POA: Diagnosis not present

## 2019-05-13 ENCOUNTER — Ambulatory Visit: Payer: Self-pay | Admitting: General Surgery

## 2019-05-13 DIAGNOSIS — L729 Follicular cyst of the skin and subcutaneous tissue, unspecified: Secondary | ICD-10-CM | POA: Diagnosis not present

## 2019-05-13 NOTE — H&P (Unsigned)
  History of Present Illness Tony Cruz; 05/13/2019 11:06 AM) The patient is a 85 year old male who presents with a complaint of cyst. Patient is an 85 year old male who is referred by Dr. Velna Hatchet, for evaluation of a back cyst. Patient states that he's been dealing with back cyst infection for 60 years. He states this is usually a year recurrence were the area gets infected, drains, and then settles back down. Patient states he has taken antibiotics usually. He states that this most recently occurred to 3 weeks ago. He just finished his 5 days course of antibiotics. Patient will like to talk about possibly removing the cyst so that he can not have it recur.  Patient does takes her also for previous DVT. He appears to have factor V Leiden deficiency. Patient sees [name] his cardiologist Patient's PCP is Dr. Ardeth Perfect   Allergies Sabino Gasser, [place]; 05/13/2019 10:47 AM) Fish  Hives.  Medication History Sabino Gasser, CMA; 05/13/2019 10:51 AM) Ferrous Sulfate (325 (65 Fe)MG Tablet, Oral) Active. Finasteride (5MG  Tablet, Oral) Active. Flomax (0.4MG  Capsule, Oral) Active. Lecithin (1200MG  Capsule, Oral) Active. Loratadine (10MG  Capsule, Oral) Active. Losartan Potassium (100MG  Tablet, Oral) Active. Naproxen (500MG  Tablet, Oral) Active. Pantoprazole Sodium (40MG  Tablet DR, Oral) Active. traMADol HCl (50MG  Tablet, Oral) Active. Xarelto (15MG  Tablet, Oral) Active. Medications Reconciled    Review of Systems Tony Ok, Cruz; 05/13/2019 11:8 AM) All other systems negative  Vitals Sabino Gasser CMA; 05/13/2019 10:52 AM) 05/13/2019 10:51 AM Weight: 214.6 lb Height: 69in Body Surface Area: 2.13 m Body Mass Index: 31.69 kg/m  Temp.: 58F (Oral)  Pulse: 95 (Regular)  BP: 130/82(Sitting, Left Arm, Standard)       Physical Exam Tony Cruz; 05/13/2019 11:07 AM) The physical exam findings are as follows: Note:  Constitutional: No acute distress, conversant, appears stated age  Eyes: Anicteric sclerae, moist conjunctiva, no lid lag  Neck: No thyromegaly, trachea midline, no cervical lymphadenopathy  Lungs: Clear to auscultation biilaterally, normal respiratory effot  Cardiovascular: regular rate & rhythm, no murmurs, no peripheal edema, pedal pulses 2+  GI: Soft, no masses or hepatosplenomegaly, non-tender to palpation  MSK: Normal gait, no clubbing cyanosis, edema  Skin: No rashes, palpation reveals normal skin turgor, patient upper back cyst, appears to be an epidermal inclusion cyst, separate area of decompression just at the 7:00 area from the cyst. There is some minimal drainage currently from this area.  Psychiatric: Appropriate judgment and insight, oriented to person, place, and time ,    Assessment & Plan Tony Cruz; 05/13/2019 11:08 AM)  CYST OF SKIN (L72.9) Impression: 85 year old male with a recurring epidermal inclusion cyst. I discussed with patient that we will have to obtain clearance from his cardiologist and his PCP from the heart and Xarelto standpoint. We will schedule him for excision of cyst, and tract. I discussed with him the risks and benefits of the procedure to include but not limited to: Infection, bleeding, damages running structures, possible skin dehiscence. Possible recurrence. Patient was understanding and wishes to proceed.

## 2019-05-26 ENCOUNTER — Telehealth: Payer: Self-pay | Admitting: *Deleted

## 2019-05-26 ENCOUNTER — Ambulatory Visit (INDEPENDENT_AMBULATORY_CARE_PROVIDER_SITE_OTHER): Payer: Medicare HMO | Admitting: *Deleted

## 2019-05-26 DIAGNOSIS — I495 Sick sinus syndrome: Secondary | ICD-10-CM

## 2019-05-26 DIAGNOSIS — R55 Syncope and collapse: Secondary | ICD-10-CM

## 2019-05-26 LAB — CUP PACEART REMOTE DEVICE CHECK
Date Time Interrogation Session: 20201028114652
Implantable Lead Implant Date: 19990330
Implantable Lead Implant Date: 19990330
Implantable Lead Location: 753859
Implantable Lead Location: 753860
Implantable Lead Model: 5068
Implantable Lead Model: 5092
Implantable Pulse Generator Implant Date: 20181024
Pulse Gen Model: 407145
Pulse Gen Serial Number: 69203177

## 2019-05-26 NOTE — Telephone Encounter (Signed)
   [facility] Pre-operative Risk Assessment    Request for surgical clearance:  1. What type of surgery is being performed? REMOVAL BACK CYST   2. When is this surgery scheduled? TBD   3. What type of clearance is required (medical clearance vs. Pharmacy clearance to hold med vs. Both)? BOTH  4. Are there any medications that need to be held prior to surgery and how long? [place]   5. Practice name and name of physician performing surgery? [facility]; DR. Anne Hahn RAMIREZ   6. What is your office phone number [phone]    7.   What is your office fax number [phone]  8.   Anesthesia type (None, local, MAC, general) ? GENERAL    Julaine Hua 05/26/2019, 9:08 AM  _________________________________________________________________   (provider comments below)

## 2019-05-26 NOTE — Telephone Encounter (Signed)
Left message with the patient to call back and speak to the on call preop APP of the day

## 2019-05-26 NOTE — Telephone Encounter (Signed)
I called the requesting provider, the cardiac clearance is for a epidermal inclusion cyst on his back. Overall, low risk procedure

## 2019-05-26 NOTE — Telephone Encounter (Addendum)
Patient is on Xarelto for heterozygous factor V leiden mutation and hx of DVT. This is appears to be managed by Dr. Lenna Gilford. I will defer to managing provider.

## 2019-06-08 NOTE — Telephone Encounter (Signed)
Left VM for patient to call back

## 2019-06-09 NOTE — Progress Notes (Signed)
Remote pacemaker transmission.   

## 2019-06-10 NOTE — Telephone Encounter (Signed)
   Primary Cardiologist: Virl Axe, MD  Chart reviewed as part of pre-operative protocol coverage. Patient was contacted 06/10/2019 in reference to pre-operative risk assessment for pending surgery as outlined below.  BRENTIN ALANA was last seen on 08/19/2018 by [name].  Since that day, CHANZE VANEGAS has done well from a cardiac standpoint. He push mows his grass and walks a [place] 3 golf course 2-3 times per week and denies chest pain or SOB with those activities. He can easily complete 4 METs without anginal complaints  Therefore, based on ACC/AHA guidelines, the patient would be at acceptable risk for the planned procedure without further cardiovascular testing.   Patient reports difficulty finding someone to manage his xarelto after Dr. Lenna Gilford retired. Suggested his PCP or another provider in the pulmonology office should manage this for his factor V leiden mutations. Patient was in agreement with this plan. Will defer to PCP on recommendations for holding his xarelto for the upcoming procedure.   I will route this recommendation to the requesting party via Epic fax function and remove from pre-op pool.  Please call with questions.  [name], PA-C 06/10/2019, 4:58 PM

## 2019-06-15 ENCOUNTER — Other Ambulatory Visit: Payer: Self-pay

## 2019-06-15 ENCOUNTER — Ambulatory Visit: Payer: Medicare HMO | Admitting: Sports Medicine

## 2019-06-15 ENCOUNTER — Encounter: Payer: Self-pay | Admitting: Sports Medicine

## 2019-06-15 DIAGNOSIS — B351 Tinea unguium: Secondary | ICD-10-CM | POA: Diagnosis not present

## 2019-06-15 DIAGNOSIS — M79674 Pain in right toe(s): Secondary | ICD-10-CM | POA: Diagnosis not present

## 2019-06-15 DIAGNOSIS — M2041 Other hammer toe(s) (acquired), right foot: Secondary | ICD-10-CM | POA: Diagnosis not present

## 2019-06-15 DIAGNOSIS — M79675 Pain in left toe(s): Secondary | ICD-10-CM

## 2019-06-15 DIAGNOSIS — M2042 Other hammer toe(s) (acquired), left foot: Secondary | ICD-10-CM

## 2019-06-15 DIAGNOSIS — G629 Polyneuropathy, unspecified: Secondary | ICD-10-CM | POA: Diagnosis not present

## 2019-06-15 DIAGNOSIS — D689 Coagulation defect, unspecified: Secondary | ICD-10-CM

## 2019-06-15 NOTE — Progress Notes (Signed)
Subjective: Tony Cruz is a 85 y.o. male patient seen today in office with complaint of mildly painful thickened and elongated toenails; unable to trim. Denies any changes with meds, still on Xarelto like before no new concerns except numbness to both feet especially when steps out of bed in the morning.   Patient Active Problem List   Diagnosis Date Noted  . Pain in right knee 12/30/2017  . Anemia, iron deficiency 12/11/2017  . Toe ulcer ([place]) 01/13/2017  . Heterozygous factor V Leiden mutation ([place]) 01/13/2017  . DVT (deep venous thrombosis) ([place]) 08/22/2016  . History of skull fracture 10/19/2015  . History of subdural hematoma 10/19/2015  . Headache(784.0) 09/30/2013  . Sinoatrial node dysfunction (HCC) 06/01/2013  . Pacemaker-MDT 07/02/2011  . Night sweats 07/02/2011  . DIZZINESS 03/22/2010  . Impaired fasting glucose 11/02/2009  . Obstructive sleep apnea 11/21/2008  . Backache 11/03/2008  . Overweight 11/02/2007  . Essential hypertension 11/02/2007  . ALLERGIC RHINITIS 11/02/2007  . GASTROESOPHAGEAL REFLUX DISEASE 11/02/2007  . Diverticulosis of large intestine 11/02/2007  . Benign prostatic hyperplasia with urinary obstruction 11/02/2007  . SYNCOPE 11/02/2007  . Hereditary and idiopathic peripheral neuropathy 10/30/2007  . IRRITABLE BOWEL SYNDROME 10/30/2007  . Osteoarthritis 10/30/2007    Current Outpatient Medications on File Prior to Visit  Medication Sig Dispense Refill  . acetaminophen (TYLENOL) 500 MG tablet Take 500-1,000 mg by mouth every 6 (six) hours as needed for headache (or pain).     Marland Kitchen amLODipine (NORVASC) 5 MG tablet     . calcium carbonate (OS-CAL) 600 MG TABS Take 600 mg by mouth daily.      . Cholecalciferol (VITAMIN D) 1000 UNITS capsule Take 1,000 Units by mouth daily.      . finasteride (PROSCAR) 5 MG tablet Take 1 tablet by mouth daily.    . Lecithin 1200 MG CAPS Take 1 capsule by mouth daily.      Marland Kitchen loratadine (CLARITIN) 10 MG tablet Take 10  mg by mouth daily.      Marland Kitchen losartan (COZAAR) 100 MG tablet Take 1 tablet (100 mg total) by mouth daily. 90 tablet 3  . Multiple Vitamin (MULTIVITAMIN) capsule Take 1 capsule by mouth daily.      . naproxen (NAPROSYN) 500 MG tablet TAKE 1 TABLET TWICE A DAY 180 tablet 3  . Omega-3 Fatty Acids (FISH OIL) 1200 MG CAPS Take 1,200 mg by mouth daily.    . pantoprazole (PROTONIX) 40 MG tablet TAKE ONE TABLET DAILY. MAKEAN APPOINTMENT FOR FURTHER REFILLS 90 tablet 3  . Probiotic Product (PROBIOTIC PO) Take 1 capsule by mouth daily.    . Tamsulosin HCl (FLOMAX) 0.4 MG CAPS Take 0.4 mg by mouth daily.      . traMADol (ULTRAM) 50 MG tablet Take 1 tablet (50 mg total) by mouth 3 (three) times daily. 90 tablet 5  . XARELTO 15 MG TABS tablet TAKE 1 TABLET DAILY WITH   SUPPER 90 tablet 0  . chlorthalidone (HYGROTON) 25 MG tablet Take 1 tablet (25 mg total) by mouth daily. 90 tablet 3   Current Facility-Administered Medications on File Prior to Visit  Medication Dose Route Frequency Provider Last Rate Last Dose  . 0.9 %  sodium chloride infusion  500 mL Intravenous Continuous Pyrtle, Lajuan Lines, MD        Allergies  Allergen Reactions  . Shellfish Allergy Rash    Sensitive to shellfish    Objective: Physical Exam  General: Well developed, nourished, no acute distress,  awake, alert and oriented x 3  Vascular: Dorsalis pedis artery 1/4 bilateral, Posterior tibial artery 0/4 bilateral, skin temperature warm to warm proximal to distal bilateral lower extremities due to trace edema, + varicosities, pedal hair present bilateral.  Neurological: Gross sensation present via light touch bilateral.   Dermatological: Skin is warm, dry, and supple bilateral, Nails 1-10 are tender, long, thick, and discolored with mild subungal debris, no webspace macerations present bilateral, no open lesions present bilateral, no callus/corns/hyperkeratotic tissue present bilateral. No signs of infection bilateral.  Musculoskeletal:   Asymptomatic claw/hammertoes boney deformities noted bilateral. Muscular strength within normal limits without painon range of motion. No pain with calf compression bilateral.  Assessment and Plan:  Problem List Items Addressed This Visit    None    Visit Diagnoses    Pain due to onychomycosis of toenails of both feet    -  Primary   Neuropathy       Coagulation defect (HCC)       Hammer toes of both feet         -Examined patient.  -Re-Discussed treatment options for painful mycotic nails. -Mechanically debrided and reduced mycotic nails with sterile nail nipper and dremel nail file without incident. -Recommend daily inspection in setting of neuropathy like previous and gentle stretching before getting out of bed -Patient to return in 3 months for follow up evaluation or sooner if symptoms worsen.  Landis Martins, DPM

## 2019-06-17 DIAGNOSIS — H40013 Open angle with borderline findings, low risk, bilateral: Secondary | ICD-10-CM | POA: Diagnosis not present

## 2019-08-04 ENCOUNTER — Other Ambulatory Visit: Payer: Self-pay | Admitting: Pulmonary Disease

## 2019-08-12 DIAGNOSIS — N401 Enlarged prostate with lower urinary tract symptoms: Secondary | ICD-10-CM | POA: Diagnosis not present

## 2019-08-12 DIAGNOSIS — R338 Other retention of urine: Secondary | ICD-10-CM | POA: Diagnosis not present

## 2019-08-12 DIAGNOSIS — N138 Other obstructive and reflux uropathy: Secondary | ICD-10-CM | POA: Diagnosis not present

## 2019-08-19 DIAGNOSIS — R7309 Other abnormal glucose: Secondary | ICD-10-CM | POA: Diagnosis not present

## 2019-08-19 DIAGNOSIS — D649 Anemia, unspecified: Secondary | ICD-10-CM | POA: Diagnosis not present

## 2019-08-19 DIAGNOSIS — I10 Essential (primary) hypertension: Secondary | ICD-10-CM | POA: Diagnosis not present

## 2019-08-19 DIAGNOSIS — R7989 Other specified abnormal findings of blood chemistry: Secondary | ICD-10-CM | POA: Diagnosis not present

## 2019-08-19 DIAGNOSIS — K219 Gastro-esophageal reflux disease without esophagitis: Secondary | ICD-10-CM | POA: Diagnosis not present

## 2019-08-19 DIAGNOSIS — D692 Other nonthrombocytopenic purpura: Secondary | ICD-10-CM | POA: Diagnosis not present

## 2019-08-19 DIAGNOSIS — N401 Enlarged prostate with lower urinary tract symptoms: Secondary | ICD-10-CM | POA: Diagnosis not present

## 2019-08-19 DIAGNOSIS — Z7689 Persons encountering health services in other specified circumstances: Secondary | ICD-10-CM | POA: Diagnosis not present

## 2019-08-23 DIAGNOSIS — Z7689 Persons encountering health services in other specified circumstances: Secondary | ICD-10-CM | POA: Diagnosis not present

## 2019-08-23 DIAGNOSIS — D649 Anemia, unspecified: Secondary | ICD-10-CM | POA: Diagnosis not present

## 2019-08-24 ENCOUNTER — Ambulatory Visit: Payer: Medicare HMO

## 2019-08-25 ENCOUNTER — Other Ambulatory Visit: Payer: Self-pay

## 2019-08-25 ENCOUNTER — Ambulatory Visit: Payer: Medicare HMO | Admitting: Internal Medicine

## 2019-08-25 ENCOUNTER — Encounter: Payer: Self-pay | Admitting: Internal Medicine

## 2019-08-25 VITALS — BP 120/82 | HR 80 | Ht 68.5 in | Wt 213.8 lb

## 2019-08-25 DIAGNOSIS — Z950 Presence of cardiac pacemaker: Secondary | ICD-10-CM | POA: Diagnosis not present

## 2019-08-25 DIAGNOSIS — I495 Sick sinus syndrome: Secondary | ICD-10-CM | POA: Diagnosis not present

## 2019-08-25 DIAGNOSIS — R55 Syncope and collapse: Secondary | ICD-10-CM | POA: Diagnosis not present

## 2019-08-25 DIAGNOSIS — R609 Edema, unspecified: Secondary | ICD-10-CM

## 2019-08-25 DIAGNOSIS — I10 Essential (primary) hypertension: Secondary | ICD-10-CM

## 2019-08-25 MED ORDER — CHLORTHALIDONE 25 MG PO TABS: 25.0000 mg | ORAL_TABLET | Freq: Every day | ORAL | 3 refills | Status: DC

## 2019-08-25 NOTE — Patient Instructions (Addendum)
Medication Instructions:  Your physician has recommended you make the following change in your medication:   Stop taking Amlodipine   Your Chlorthalidone 25mg  tablets has been refilled today.  You will take 1 tablet daily by mouth  Labwork: BMET Tuesday, 09/14/2019  Testing/Procedures: None ordered.  Follow-Up: Your physician wants you to follow-up in one year with Dr Caryl Comes. You will receive a reminder letter in the mail two months in advance. If you don't receive a letter, please call our office to schedule the follow-up appointment.  Remote monitoring is used to monitor your Pacemaker of ICD from home. This monitoring reduces the number of office visits required to check your device to one time per year. It allows [place] to keep an eye on the functioning of your device to ensure it is working properly.   Any Other Special Instructions Will Be Listed Below (If Applicable).  Please send [place] a MyChart message in 3 weeks to let us know if you are having a problem with swelling.  If you need a refill on your cardiac medications before your next appointment, please call your pharmacy.

## 2019-08-25 NOTE — Progress Notes (Signed)
Patient Care Team: Velna Hatchet, MD as PCP - General (Internal Medicine) Deboraha Sprang, MD as PCP - Electrophysiology (Cardiology)   HPI  Tony Cruz is a 86 y.o. male is seen in followup for syncope secondary to intermittent sinus node dysfunction for which a pacemaker Biotronik remotely implanted and changed out 11/08, 10/18      Date Cr K Hgb  6/19 0.86 4.3 14.5  1/20  0.77 4.3 13.0  7/20 0.9 4.3 13.8   No recent syncope.  Has had some lightheadedness with standing that abates after just a few moments.  He has noted on his blood pressure monitor some irregular heartbeats unassociated with symptoms.  Xarelto for DVT and F V Leiden  The patient denies chest pain, shortness of breath, nocturnal dyspnea, orthopnea --.  There have been no palpitations, lightheadedness or syncope.  Significant edema times months.  On amlodipine for a year or 2.  Previously on chlorthalidone; had tolerated.  Playing Golf 3-4 days per week   No chest pain. Past Medical History:  Diagnosis Date  . Allergy   . Anal fissure   . Anemia   . Backache, unspecified   . Barrett's esophagus   . Diverticulosis of colon (without mention of hemorrhage)   . DVT of leg (deep venous thrombosis) ([place])   . Esophageal reflux   . Factor V Leiden deficiency ([place])   . hypertension   . Hypertrophy of prostate with urinary obstruction and other lower urinary tract symptoms (LUTS)   . Irritable bowel syndrome   . Obstructive sleep apnea   . Osteoarthrosis, unspecified whether generalized or localized, unspecified site   . Other abnormal glucose   . Overweight(278.02)   . Pacemaker-MDT    Change out 2008  . Sleep apnea 2016   Uses cpap  . Syncope and collapse   . Tubular adenoma of colon   . Unspecified hereditary and idiopathic peripheral neuropathy     Past Surgical History:  Procedure Laterality Date  . APPENDECTOMY    . CHOLECYSTECTOMY    . PACEMAKER PLACEMENT    . PPM GENERATOR  CHANGEOUT N/A 05/21/2017   Procedure: PPM GENERATOR CHANGEOUT;  Surgeon: Deboraha Sprang, MD;  Location: [facility];  Service: Cardiovascular;  Laterality: N/A;  . skin cancer removed  12/2012   removed from his face--Dr. Sarajane Jews  . TONSILLECTOMY      Current Outpatient Medications  Medication Sig Dispense Refill  . acetaminophen (TYLENOL) 500 MG tablet Take 500-1,000 mg by mouth every 6 (six) hours as needed for headache (or pain).     Marland Kitchen amLODipine (NORVASC) 5 MG tablet     . ascorbic acid (VITAMIN C) 500 MG tablet Take 1 tablet by mouth daily.    . calcium carbonate (OS-CAL) 600 MG TABS Take 600 mg by mouth daily.      . ferrous sulfate 325 (65 FE) MG tablet Take 325 mg by mouth daily with breakfast.    . finasteride (PROSCAR) 5 MG tablet Take 1 tablet by mouth daily.    . Lecithin 1200 MG CAPS Take 1 capsule by mouth daily.      Marland Kitchen loratadine (CLARITIN) 10 MG tablet Take 10 mg by mouth daily.      Marland Kitchen losartan (COZAAR) 100 MG tablet Take 1 tablet (100 mg total) by mouth daily. 90 tablet 3  . Multiple Vitamin (MULTIVITAMIN) capsule Take 1 capsule by mouth daily.      . naproxen (NAPROSYN)  500 MG tablet TAKE 1 TABLET TWICE A DAY 180 tablet 3  . Omega-3 Fatty Acids (FISH OIL) 1200 MG CAPS Take 1,200 mg by mouth daily.    . pantoprazole (PROTONIX) 40 MG tablet TAKE ONE TABLET DAILY. MAKEAN APPOINTMENT FOR FURTHER REFILLS 90 tablet 3  . Probiotic Product (PROBIOTIC PO) Take 1 capsule by mouth daily.    . Tamsulosin HCl (FLOMAX) 0.4 MG CAPS Take 0.4 mg by mouth daily.      . traMADol (ULTRAM) 50 MG tablet Take 1 tablet (50 mg total) by mouth 3 (three) times daily. 90 tablet 5  . XARELTO 15 MG TABS tablet TAKE 1 TABLET DAILY WITH   SUPPER 90 tablet 0   Current Facility-Administered Medications  Medication Dose Route Frequency Provider Last Rate Last Admin  . 0.9 %  sodium chloride infusion  500 mL Intravenous Continuous Pyrtle, Lajuan Lines, MD        Allergies  Allergen Reactions  .  Shellfish Allergy Rash    Sensitive to shellfish    Review of Systems negative except from HPI and PMH  Physical Exam BP 120/82   Pulse 80   Ht 5' 8.5" (1.74 m)   Wt 213 lb 12.8 oz (97 kg)   SpO2 98%   BMI 32.04 kg/m  Well developed and well nourished in no acute distress HENT normal Neck supple with JVP-flat ClearDevice pocket well healed; without hematoma or erythema.  There is no tethering  Regular rate and rhythm, no  gallop No murmur Abd-soft with active BS No Clubbing cyanosis 2+ edema Skin-warm and dry A & Oriented  Grossly normal sensory and motor function  ECG sinus at 80 Interval 21/11/38 RSR prime  Assessment and plan  Syncope presumed neurally mediated  Sinus node dysfunction-prob neurally   Pacemaker Bi[employer]ik The patient's device was interrogated.  The information was reviewed. No changes were made in the programming.     Hypertension   Balance Neuropathy  DVT---- Factor V Leiden  Significant edema.  I wonder whether it is related to the amlodipine.  We will discontinue the amlodipine and have him resume his chlorthalidone.  We will check a metabolic profile in 3 weeks time.  They have been eating out more with Covid.  Have suggested being more attentive to sodium intake.  No bleeding on his Xarelto  No interval syncope  We spent more than 50% of our >30 min visit in face to face counseling regarding the above

## 2019-09-04 ENCOUNTER — Ambulatory Visit: Payer: Medicare HMO | Attending: Internal Medicine

## 2019-09-04 ENCOUNTER — Ambulatory Visit: Payer: Medicare HMO

## 2019-09-04 DIAGNOSIS — Z23 Encounter for immunization: Secondary | ICD-10-CM | POA: Insufficient documentation

## 2019-09-04 NOTE — Progress Notes (Signed)
   Covid-19 Vaccination Clinic  Name:  Tony Cruz    MRN: [id number] DOB: January 13, 1934  09/04/2019  Mr. Tony Cruz was observed post Covid-19 immunization for 15 minutes without incidence. He was provided with Vaccine Information Sheet and instruction to access the V-Safe system.   Mr. Tony Cruz was instructed to call 911 with any severe reactions post vaccine: Marland Kitchen Difficulty breathing  . Swelling of your face and throat  . A fast heartbeat  . A bad rash all over your body  . Dizziness and weakness    Immunizations Administered    Name Date Dose VIS Date Route   Pfizer COVID-19 Vaccine 09/04/2019  8:55 AM 0.3 mL 07/09/2019 Intramuscular   Manufacturer: [place]   Lot: [id number]   [place]: [id number]

## 2019-09-14 ENCOUNTER — Other Ambulatory Visit: Payer: Medicare HMO

## 2019-09-14 ENCOUNTER — Other Ambulatory Visit: Payer: Self-pay

## 2019-09-14 DIAGNOSIS — R55 Syncope and collapse: Secondary | ICD-10-CM | POA: Diagnosis not present

## 2019-09-14 DIAGNOSIS — I10 Essential (primary) hypertension: Secondary | ICD-10-CM | POA: Diagnosis not present

## 2019-09-14 DIAGNOSIS — I495 Sick sinus syndrome: Secondary | ICD-10-CM

## 2019-09-14 DIAGNOSIS — R609 Edema, unspecified: Secondary | ICD-10-CM

## 2019-09-14 LAB — BASIC METABOLIC PANEL
BUN/Creatinine Ratio: 15 (ref 10–24)
BUN: 15 mg/dL (ref 8–27)
CO2: 23 mmol/L (ref 20–29)
Calcium: 9 mg/dL (ref 8.6–10.2)
Chloride: 94 mmol/L — ABNORMAL LOW (ref 96–106)
Creatinine, Ser: 0.99 mg/dL (ref 0.76–1.27)
GFR calc Af Amer: 81 mL/min/{1.73_m2} (ref >59)
GFR calc non Af Amer: 70 mL/min/{1.73_m2} (ref >59)
Glucose: 133 mg/dL — ABNORMAL HIGH (ref 65–99)
Potassium: 3.7 mmol/L (ref 3.5–5.2)
Sodium: 132 mmol/L — ABNORMAL LOW (ref 134–144)

## 2019-09-21 ENCOUNTER — Encounter: Payer: Self-pay | Admitting: Sports Medicine

## 2019-09-21 ENCOUNTER — Ambulatory Visit: Payer: Medicare HMO | Admitting: Sports Medicine

## 2019-09-21 ENCOUNTER — Other Ambulatory Visit: Payer: Self-pay

## 2019-09-21 DIAGNOSIS — G629 Polyneuropathy, unspecified: Secondary | ICD-10-CM

## 2019-09-21 DIAGNOSIS — B351 Tinea unguium: Secondary | ICD-10-CM

## 2019-09-21 DIAGNOSIS — M79675 Pain in left toe(s): Secondary | ICD-10-CM | POA: Diagnosis not present

## 2019-09-21 DIAGNOSIS — D689 Coagulation defect, unspecified: Secondary | ICD-10-CM

## 2019-09-21 DIAGNOSIS — M79674 Pain in right toe(s): Secondary | ICD-10-CM

## 2019-09-21 NOTE — Progress Notes (Signed)
Subjective: Tony Cruz is a 86 y.o. male patient seen today in office with complaint of mildly painful thickened and elongated toenails; unable to trim. Denies any changes with meds, still on Xarelto like before and has numbness in both feet like before. No other issues.   Patient Active Problem List   Diagnosis Date Noted  . Pain in right knee 12/30/2017  . Anemia, iron deficiency 12/11/2017  . Toe ulcer ([place]) 01/13/2017  . Heterozygous factor V Leiden mutation ([place]) 01/13/2017  . DVT (deep venous thrombosis) ([place]) 08/22/2016  . History of skull fracture 10/19/2015  . History of subdural hematoma 10/19/2015  . Headache(784.0) 09/30/2013  . Sinoatrial node dysfunction ([facility]) 06/01/2013  . Pacemaker-MDT 07/02/2011  . Night sweats 07/02/2011  . DIZZINESS 03/22/2010  . Impaired fasting glucose 11/02/2009  . Obstructive sleep apnea 11/21/2008  . Backache 11/03/2008  . Overweight 11/02/2007  . Essential hypertension 11/02/2007  . ALLERGIC RHINITIS 11/02/2007  . GASTROESOPHAGEAL REFLUX DISEASE 11/02/2007  . Diverticulosis of large intestine 11/02/2007  . Benign prostatic hyperplasia with urinary obstruction 11/02/2007  . SYNCOPE 11/02/2007  . Hereditary and idiopathic peripheral neuropathy 10/30/2007  . IRRITABLE BOWEL SYNDROME 10/30/2007  . Osteoarthritis 10/30/2007    Current Outpatient Medications on File Prior to Visit  Medication Sig Dispense Refill  . acetaminophen (TYLENOL) 500 MG tablet Take 500-1,000 mg by mouth every 6 (six) hours as needed for headache (or pain).     Marland Kitchen ascorbic acid (VITAMIN C) 500 MG tablet Take 1 tablet by mouth daily.    . calcium carbonate (OS-CAL) 600 MG TABS Take 600 mg by mouth daily.      . chlorthalidone (HYGROTON) 25 MG tablet Take 1 tablet (25 mg total) by mouth daily. 90 tablet 3  . ferrous sulfate 325 (65 FE) MG tablet Take 325 mg by mouth daily with breakfast.    . finasteride (PROSCAR) 5 MG tablet Take 1 tablet by mouth daily.    .  Lecithin 1200 MG CAPS Take 1 capsule by mouth daily.      Marland Kitchen loratadine (CLARITIN) 10 MG tablet Take 10 mg by mouth daily.      Marland Kitchen losartan (COZAAR) 100 MG tablet Take 1 tablet (100 mg total) by mouth daily. 90 tablet 3  . Multiple Vitamin (MULTIVITAMIN) capsule Take 1 capsule by mouth daily.      . naproxen (NAPROSYN) 500 MG tablet TAKE 1 TABLET TWICE A DAY 180 tablet 3  . Omega-3 Fatty Acids (FISH OIL) 1200 MG CAPS Take 1,200 mg by mouth daily.    . pantoprazole (PROTONIX) 40 MG tablet TAKE ONE TABLET DAILY. MAKEAN APPOINTMENT FOR FURTHER REFILLS 90 tablet 3  . Probiotic Product (PROBIOTIC PO) Take 1 capsule by mouth daily.    . Tamsulosin HCl (FLOMAX) 0.4 MG CAPS Take 0.4 mg by mouth daily.      . traMADol (ULTRAM) 50 MG tablet Take 1 tablet (50 mg total) by mouth 3 (three) times daily. 90 tablet 5  . XARELTO 15 MG TABS tablet TAKE 1 TABLET DAILY WITH   SUPPER 90 tablet 0   Current Facility-Administered Medications on File Prior to Visit  Medication Dose Route Frequency Provider Last Rate Last Admin  . 0.9 %  sodium chloride infusion  500 mL Intravenous Continuous Pyrtle, Lajuan Lines, MD        Allergies  Allergen Reactions  . Shellfish Allergy Rash    Sensitive to shellfish    Objective: Physical Exam  General: Well developed, nourished, no  acute distress, awake, alert and oriented x 3  Vascular: Dorsalis pedis artery 1/4 bilateral, Posterior tibial artery 0/4 bilateral, skin temperature warm to warm proximal to distal bilateral lower extremities due to trace edema, + varicosities, pedal hair present bilateral.  Neurological: Gross sensation present via light touch bilateral.   Dermatological: Skin is warm, dry, and supple bilateral, Nails 1-10 are tender, long, thick, and discolored with mild subungal debris, no webspace macerations present bilateral, no open lesions present bilateral, no callus/corns/hyperkeratotic tissue present bilateral. No signs of infection  bilateral.  Musculoskeletal:  Asymptomatic claw/hammertoes boney deformities noted bilateral. Muscular strength within normal limits without painon range of motion. No pain with calf compression bilateral.  Assessment and Plan:  Problem List Items Addressed This Visit    None    Visit Diagnoses    Pain due to onychomycosis of toenails of both feet    -  Primary   Neuropathy       Coagulation defect ([place])         -Examined patient.  -Re-Discussed treatment options for painful mycotic nails. -Mechanically debrided and reduced mycotic nails with sterile nail nipper and dremel nail file without incident. -Recommend daily inspection in setting of neuropathy like before and refrain from walking barefoot -Patient to return in 3 months for follow up evaluation or sooner if symptoms worsen.  Landis Martins, DPM

## 2019-09-29 ENCOUNTER — Ambulatory Visit: Payer: Medicare HMO | Attending: Internal Medicine

## 2019-09-29 DIAGNOSIS — Z23 Encounter for immunization: Secondary | ICD-10-CM | POA: Insufficient documentation

## 2019-09-29 NOTE — Progress Notes (Signed)
   Covid-19 Vaccination Clinic  Name:  Tony Cruz    MRN: [id number] DOB: 10/31/1933  09/29/2019  [name] was observed post Covid-19 immunization for 15 minutes without incident. He was provided with Vaccine Information Sheet and instruction to access the V-Safe system.   [name] was instructed to call 911 with any severe reactions post vaccine: Marland Kitchen Difficulty breathing  . Swelling of face and throat  . A fast heartbeat  . A bad rash all over body  . Dizziness and weakness   Immunizations Administered    Name Date Dose VIS Date Route   Pfizer COVID-19 Vaccine 09/29/2019  8:38 AM 0.3 mL 07/09/2019 Intramuscular   Manufacturer: [place]   Lot: [id number]   [place]: [id number]

## 2019-10-20 DIAGNOSIS — G4733 Obstructive sleep apnea (adult) (pediatric): Secondary | ICD-10-CM | POA: Diagnosis not present

## 2019-11-08 DIAGNOSIS — M1711 Unilateral primary osteoarthritis, right knee: Secondary | ICD-10-CM | POA: Diagnosis not present

## 2019-11-10 ENCOUNTER — Telehealth: Payer: Self-pay | Admitting: *Deleted

## 2019-11-10 NOTE — Telephone Encounter (Signed)
   [facility] Pre-operative Risk Assessment    Request for surgical clearance:  1. What type of surgery is being performed? RIGHT KNEE TKA   2. When is this surgery scheduled? TBD   3. What type of clearance is required (medical clearance vs. Pharmacy clearance to hold med vs. Both)? BOTH  4. Are there any medications that need to be held prior to surgery and how long? [place]   5. Practice name and name of physician performing surgery? [facility]; [place]  6. What is your office phone number [phone] ATTN: ASHLEY HILTON    7.   What is your office fax number [phone] ATTN: ASHLEY HILTON  8.   Anesthesia type (None, local, MAC, general) ? SPINAL WITH ADDUCTOR   Julaine Hua 11/10/2019, 12:24 PM  _________________________________________________________________   (provider comments below)

## 2019-11-10 NOTE — Telephone Encounter (Signed)
   Primary Cardiologist: Virl Axe, MD  Chart reviewed as part of pre-operative protocol coverage. Patient was contacted 11/10/2019 in reference to pre-operative risk assessment for pending surgery as outlined below.  Tony Cruz was last seen on 08/25/19 by Dr. Caryl Cruz.  Since that day, Tony Cruz has done well from a cardiac standpoint. We plays golf several days per week and walks the course, and also push mows his yard. He can easily complete 4 METs without anginal complaints  Therefore, based on ACC/AHA guidelines, the patient would be at acceptable risk for the planned procedure without further cardiovascular testing.   This patient has a history of factor v leiden, for which he is on xarelto. Xarelto is managed by his PCP, therefore will defer to Dr. Ardeth Perfect for recommendations on holding xarelto in the perioperative setting.  I will route this recommendation to the requesting party via Epic fax function and remove from pre-op pool.  Please call with questions.  Tony Butts, PA-C 11/10/2019, 1:49 PM

## 2019-11-16 DIAGNOSIS — D649 Anemia, unspecified: Secondary | ICD-10-CM | POA: Diagnosis not present

## 2019-11-16 DIAGNOSIS — I10 Essential (primary) hypertension: Secondary | ICD-10-CM | POA: Diagnosis not present

## 2019-11-16 DIAGNOSIS — R7309 Other abnormal glucose: Secondary | ICD-10-CM | POA: Diagnosis not present

## 2019-11-16 DIAGNOSIS — Z86718 Personal history of other venous thrombosis and embolism: Secondary | ICD-10-CM | POA: Diagnosis not present

## 2019-11-16 DIAGNOSIS — Z7689 Persons encountering health services in other specified circumstances: Secondary | ICD-10-CM | POA: Diagnosis not present

## 2019-11-16 DIAGNOSIS — G4733 Obstructive sleep apnea (adult) (pediatric): Secondary | ICD-10-CM | POA: Diagnosis not present

## 2019-11-16 DIAGNOSIS — Z01818 Encounter for other preprocedural examination: Secondary | ICD-10-CM | POA: Diagnosis not present

## 2019-11-16 DIAGNOSIS — M25562 Pain in left knee: Secondary | ICD-10-CM | POA: Diagnosis not present

## 2019-11-16 DIAGNOSIS — D6851 Activated protein C resistance: Secondary | ICD-10-CM | POA: Diagnosis not present

## 2019-11-23 ENCOUNTER — Ambulatory Visit (INDEPENDENT_AMBULATORY_CARE_PROVIDER_SITE_OTHER): Payer: Medicare HMO | Admitting: *Deleted

## 2019-11-23 DIAGNOSIS — I495 Sick sinus syndrome: Secondary | ICD-10-CM

## 2019-11-23 LAB — CUP PACEART REMOTE DEVICE CHECK
Date Time Interrogation Session: 20210427082439
Implantable Lead Implant Date: 19990330
Implantable Lead Implant Date: 19990330
Implantable Lead Location: 753859
Implantable Lead Location: 753860
Implantable Lead Model: 5068
Implantable Lead Model: 5092
Implantable Pulse Generator Implant Date: 20181024
Pulse Gen Model: 407145
Pulse Gen Serial Number: 69203177

## 2019-11-24 NOTE — H&P (View-Only) (Signed)
PPM Remote  

## 2019-11-24 NOTE — Progress Notes (Signed)
PPM Remote  

## 2019-11-25 NOTE — Patient Instructions (Addendum)
DUE TO COVID-19 ONLY ONE VISITOR IS ALLOWED TO COME WITH YOU AND STAY IN THE WAITING ROOM ONLY DURING PRE OP AND PROCEDURE DAY OF SURGERY. THE 1 VISITOR MAY VISIT WITH YOU AFTER SURGERY IN YOUR PRIVATE ROOM DURING VISITING HOURS ONLY!  YOU NEED TO HAVE A COVID 19 TEST ON: 11/30/19 @  2:45 pm , THIS TEST MUST BE DONE BEFORE SURGERY, COME  Tony, [place] , [id number].  (Time) ONCE YOUR COVID TEST IS COMPLETED, PLEASE BEGIN THE QUARANTINE INSTRUCTIONS AS OUTLINED IN YOUR HANDOUT.                Tony Cruz     Your procedure is scheduled on: 12/03/19   Report to [facility] Main  Entrance   Report to short stay at: 5:30 AM     Call this number if you have problems the morning of surgery [phone]    Remember: Do not eat food or drink liquids :After Midnight.   BRUSH YOUR TEETH MORNING OF SURGERY AND RINSE YOUR MOUTH OUT, NO CHEWING GUM CANDY OR MINTS.     Take these medicines the morning of surgery with A SIP OF WATER: finasteride,loratadine,tamsulosin,pantoprazole.                                 You may not have any metal on your body including hair pins and              piercings  Do not wear jewelry, lotions, powders or perfumes, deodorant             Men may shave face and neck.   Do not bring valuables to the hospital. [name], [name], [name], [name], you can play an important role.  Because skin is not sterile, your skin needs to be as free of germs as possible.  You can reduce the number of germs on your skin by washing with CHG (chlorahexidine gluconate) soap before surgery.  CHG is an antiseptic cleaner which kills germs and bonds with the skin to continue killing germs even after washing. Please DO NOT use if you have an allergy to CHG or antibacterial soaps.  If your skin becomes reddened/irritated stop using the CHG and inform your nurse when you arrive at Short Stay. Do not shave (including legs and underarms) for at least 48 hours prior to the first CHG shower.  You may shave your face/neck. Please  follow these instructions carefully:  1.  Shower with CHG Soap the night before surgery and the  morning of Surgery.  2.  If you choose to wash your hair, wash your hair first as usual with your  normal  shampoo.  3.  After you shampoo, rinse your hair and body thoroughly to remove the  shampoo.                           4.  Use CHG as you would any other liquid soap.  You can apply chg directly  to the skin and wash                       Gently with a scrungie or clean washcloth.  5.  Apply the CHG Soap to your body ONLY FROM THE NECK DOWN.   Do not use on face/ open                           Wound or open sores. Avoid contact with eyes, ears mouth and genitals (private parts).                       Wash face,  Genitals (private parts) with your normal soap.             6.  Wash thoroughly, paying special attention to the area where your surgery  will be performed.  7.  Thoroughly rinse your body with warm water from the neck down.  8.  DO NOT shower/wash with your normal soap after using and rinsing off  the CHG Soap.                9.  Pat yourself dry with a clean  towel.            10.  Wear clean pajamas.            11.  Place clean sheets on your bed the night of your first shower and do not  sleep with pets. Day of Surgery : Do not apply any lotions/deodorants the morning of surgery.  Please wear clean clothes to the hospital/surgery [facility].  FAILURE TO FOLLOW THESE INSTRUCTIONS MAY RESULT IN THE CANCELLATION OF YOUR SURGERY PATIENT SIGNATURE_________________________________  NURSE SIGNATURE__________________________________  ________________________________________________________________________

## 2019-11-26 ENCOUNTER — Encounter (HOSPITAL_COMMUNITY): Payer: Self-pay

## 2019-11-26 ENCOUNTER — Encounter (HOSPITAL_COMMUNITY)
Admission: RE | Admit: 2019-11-26 | Discharge: 2019-11-26 | Disposition: A | Discharge status: Home / Self Care | Payer: Medicare HMO | Source: Ambulatory Visit | Attending: Specialist | Admitting: Specialist

## 2019-11-26 ENCOUNTER — Other Ambulatory Visit: Payer: Self-pay

## 2019-11-26 DIAGNOSIS — M1711 Unilateral primary osteoarthritis, right knee: Secondary | ICD-10-CM | POA: Diagnosis not present

## 2019-11-26 DIAGNOSIS — Z950 Presence of cardiac pacemaker: Secondary | ICD-10-CM | POA: Insufficient documentation

## 2019-11-26 DIAGNOSIS — Z79899 Other long term (current) drug therapy: Secondary | ICD-10-CM | POA: Diagnosis not present

## 2019-11-26 DIAGNOSIS — Z7901 Long term (current) use of anticoagulants: Secondary | ICD-10-CM | POA: Insufficient documentation

## 2019-11-26 DIAGNOSIS — Z86718 Personal history of other venous thrombosis and embolism: Secondary | ICD-10-CM | POA: Insufficient documentation

## 2019-11-26 DIAGNOSIS — I10 Essential (primary) hypertension: Secondary | ICD-10-CM | POA: Diagnosis not present

## 2019-11-26 DIAGNOSIS — G4733 Obstructive sleep apnea (adult) (pediatric): Secondary | ICD-10-CM | POA: Insufficient documentation

## 2019-11-26 DIAGNOSIS — Z01812 Encounter for preprocedural laboratory examination: Secondary | ICD-10-CM | POA: Insufficient documentation

## 2019-11-26 DIAGNOSIS — I495 Sick sinus syndrome: Secondary | ICD-10-CM | POA: Diagnosis not present

## 2019-11-26 DIAGNOSIS — D6851 Activated protein C resistance: Secondary | ICD-10-CM | POA: Diagnosis not present

## 2019-11-26 LAB — BASIC METABOLIC PANEL
Anion gap: 10 (ref 5–15)
BUN: 17 mg/dL (ref 8–23)
CO2: 27 mmol/L (ref 22–32)
Calcium: 9 mg/dL (ref 8.9–10.3)
Chloride: 96 mmol/L — ABNORMAL LOW (ref 98–111)
Creatinine, Ser: 0.87 mg/dL (ref 0.61–1.24)
GFR calc Af Amer: >60 mL/min (ref >60)
GFR calc non Af Amer: >60 mL/min (ref >60)
Glucose, Bld: 89 mg/dL (ref 70–99)
Potassium: 3.8 mmol/L (ref 3.5–5.1)
Sodium: 133 mmol/L — ABNORMAL LOW (ref 135–145)

## 2019-11-26 LAB — SURGICAL PCR SCREEN
MRSA, PCR: NEGATIVE
Staphylococcus aureus: NEGATIVE

## 2019-11-26 LAB — CBC
HCT: 43.5 % (ref 39.0–52.0)
Hemoglobin: 14.5 g/dL (ref 13.0–17.0)
MCH: 32.9 pg (ref 26.0–34.0)
MCHC: 33.3 g/dL (ref 30.0–36.0)
MCV: 98.6 fL (ref 80.0–100.0)
Platelets: 258 10*3/uL (ref 150–400)
RBC: 4.41 MIL/uL (ref 4.22–5.81)
RDW: 12.9 % (ref 11.5–15.5)
WBC: 8.4 10*3/uL (ref 4.0–10.5)
nRBC: 0 % (ref 0.0–0.2)

## 2019-11-26 NOTE — Progress Notes (Addendum)
PCP - Dr. Ardeth Perfect. Cardiologist - [name] Lovenox protocol will be initiated as out-patient. No contraindications, patient is able to understand regimen and carry out injections. Home Health will teach technique after initial office dose, and monitor progress and daily INR/CBC. Initiate at 1 mg per kg Sub-Q BID x 5-7 days until INR stable. Coumadin instructions given. No alcohol or aspirin or NSAID's. Patient warned about abnormal bleeding, progressive DVT symptoms, or appearance of sudden dyspnea or chest pain to suggest pulmonary embolism.: 08/25/19. Clearance: Roby Lofts. PAC. 11/10/19 Chest x-ray -  EKG - 08/25/19 Stress Test -  ECHO -  Cardiac Cath -   Sleep Study - yes CPAP -yes   Fasting Blood Sugar -  Checks Blood Sugar _____ times a day  Blood Thinner Instructions: Aspirin Instructions: Last Dose:  Anesthesia review: HTN,pacemaker.  Patient denies shortness of breath, fever, cough and chest pain at PAT appointment   Patient verbalized understanding of instructions that were given to them at the PAT appointment. Patient was also instructed that they will need to review over the PAT instructions again at home before surgery.

## 2019-11-26 NOTE — Progress Notes (Signed)
PERIOPERATIVE PRESCRIPTION FOR IMPLANTED CARDIAC DEVICE PROGRAMMING  Patient Information: Name:  Tony Cruz  DOB:  30-Sep-1933  MRN:  [id number]    Planned Procedure: Total knee arthroplasty. Right  Surgeon: Dr. Theda Sers  Date of Procedure: 12/03/19  Cautery will be used.  Position during surgery:    Device Information:  Clinic EP Physician:  Virl Axe, MD   Device Type:  Pacemaker Manufacturer and Phone #:  [place]: [phone] Pacemaker Dependent?:  No. Date of Last Device Check:  11/23/2019 Normal Device Function?:  Yes.    Electrophysiologist's Recommendations:   Have magnet available.  Provide continuous ECG monitoring when magnet is used or reprogramming is to be performed.   Procedure should not interfere with device function.  No device programming or magnet placement needed.  Per Device Clinic Standing Orders, Simone Curia, RN  1:40 PM 11/26/2019

## 2019-11-29 ENCOUNTER — Encounter (HOSPITAL_COMMUNITY): Payer: Medicare HMO

## 2019-11-29 NOTE — Progress Notes (Signed)
Anesthesia Chart Review   Case: [id number] Date/Time: 12/03/19 0715   Procedure: TOTAL KNEE ARTHROPLASTY (Right Knee) - adductor canal   Anesthesia type: Spinal   Pre-op diagnosis: Right knee osteoarthritis   Location: [facility] ROOM 07 / [facility]   Surgeons: Sydnee Cabal, MD      DISCUSSION:86 y.o. never smoker with h/o HTN, OSA, Factor V Leiden, DVT (on Xarelto), pacemaker in place due to syncope secondary to sinus node dysfunction (Device orders on chart), right knee OA scheduled for above procedure 12/03/2019 with Dr. Sydnee Cabal.   Per cardiology note 11/10/2019, "Chart reviewed as part of pre-operative protocol coverage. Patient was contacted 11/10/2019 in reference to pre-operative risk assessment for pending surgery as outlined below.  KORDAE BENSMAN was last seen on 08/25/19 by [name].  Since that day, PAARTH MACHO has done well from a cardiac standpoint. We plays golf several days per week and walks the course, and also push mows his yard. He can easily complete 4 METs without anginal complaints Therefore, based on ACC/AHA guidelines, the patient would be at acceptable risk for the planned procedure without further cardiovascular testing.  This patient has a history of factor v leiden, for which he is on xarelto. Xarelto is managed by his PCP, therefore will defer to Dr. Ardeth Perfect for recommendations on holding xarelto in the perioperative setting."  PCP clearance states pt to hold Xarelto 1 day prior to procedure.  Anticipate GA instead of spinal, Dr. Theda Sers aware.    Anticipate pt can proceed with planned procedure barring acute status change.     VS: BP (!) 141/82   Pulse 75   Temp 37.1 C (Oral)   Resp 18   Ht 5' 7.5" (1.715 m)   Wt 98.1 kg   SpO2 95%   BMI 33.39 kg/m   PROVIDERS: Velna Hatchet, MD is PCP   Virl Axe, MD is Cardiologist  LABS: Labs reviewed: Acceptable for surgery. (all labs ordered are listed, but only abnormal results are displayed)  Labs  Reviewed - No data to display   IMAGES:   EKG: 08/25/2019 Rate 80 bpm    CV: ETT 05/14/2016  Blood pressure demonstrated a normal response to exercise.  There was no ST segment deviation noted during stress. Baseline EKG demonstrates RBBB with T wave inversions in V1 and V2.  No evidence of exercise induced ischemia at an adequate HR reaching 99% MPHR.  Moderately impaired exercise tolerance. The patient exercised to 6.4 mets.  This is a low risk study.  Echo 05/14/2016 Study Conclusions   - Left ventricle: The cavity size was normal. Wall thickness was  normal. Systolic function was normal. The estimated ejection  fraction was in the range of 50% to 55%. Wall motion was normal;  there were no regional wall motion abnormalities. Doppler  parameters are consistent with abnormal left ventricular  relaxation (grade 1 diastolic dysfunction).  - Aortic valve: There was mild regurgitation.  - Ascending aorta: The ascending aorta was mildly dilated.  - Left atrium: The atrium was mildly dilated.   Impressions:   - Normal LV systolic function; grade 1 diastolic dysfunction; mild  AI; mild LAE; ascending aorta measures 4.5 cm; suggest CTA or MRA  to further assess. Past Medical History:  Diagnosis Date  . Allergy   . Anal fissure   . Anemia   . Backache, unspecified   . Barrett's esophagus   . Diverticulosis of colon (without mention of hemorrhage)   . DVT of leg (  deep venous thrombosis) ([place])   . Esophageal reflux   . Factor V Leiden deficiency ([place])   . hypertension   . Hypertrophy of prostate with urinary obstruction and other lower urinary tract symptoms (LUTS)   . Irritable bowel syndrome   . Obstructive sleep apnea   . Osteoarthrosis, unspecified whether generalized or localized, unspecified site   . Other abnormal glucose   . Overweight(278.02)   . Pacemaker-MDT    Change out 2008  . Sleep apnea 2016   Uses cpap  . Syncope and collapse   .  Tubular adenoma of colon   . Unspecified hereditary and idiopathic peripheral neuropathy     Past Surgical History:  Procedure Laterality Date  . APPENDECTOMY    . CHOLECYSTECTOMY    . PACEMAKER PLACEMENT    . PPM GENERATOR CHANGEOUT N/A 05/21/2017   Procedure: PPM GENERATOR CHANGEOUT;  Surgeon: Deboraha Sprang, MD;  Location: [facility];  Service: Cardiovascular;  Laterality: N/A;  . skin cancer removed  12/2012   removed from his face--Dr. Sarajane Jews  . TONSILLECTOMY      MEDICATIONS: . acetaminophen (TYLENOL) 500 MG tablet  . ascorbic acid (VITAMIN C) 500 MG tablet  . calcium carbonate (OS-CAL) 600 MG TABS  . chlorthalidone (HYGROTON) 25 MG tablet  . ferrous sulfate 325 (65 FE) MG tablet  . finasteride (PROSCAR) 5 MG tablet  . Lecithin 1200 MG CAPS  . loratadine (CLARITIN) 10 MG tablet  . losartan (COZAAR) 100 MG tablet  . Multiple Vitamin (MULTIVITAMIN) capsule  . naproxen (NAPROSYN) 500 MG tablet  . Omega-3 Fatty Acids (FISH OIL) 1200 MG CAPS  . pantoprazole (PROTONIX) 40 MG tablet  . Tamsulosin HCl (FLOMAX) 0.4 MG CAPS  . traMADol (ULTRAM) 50 MG tablet  . XARELTO 15 MG TABS tablet   . 0.9 %  sodium chloride infusion    [facility] Pre-Surgical Testing [phone] 12/01/19  12:21 PM

## 2019-11-30 ENCOUNTER — Other Ambulatory Visit (HOSPITAL_COMMUNITY)
Admission: RE | Admit: 2019-11-30 | Discharge: 2019-11-30 | Disposition: A | Discharge status: Home / Self Care | Payer: Medicare HMO | Source: Ambulatory Visit | Attending: Specialist | Admitting: Specialist

## 2019-11-30 DIAGNOSIS — Z20822 Contact with and (suspected) exposure to covid-19: Secondary | ICD-10-CM | POA: Insufficient documentation

## 2019-11-30 DIAGNOSIS — Z01812 Encounter for preprocedural laboratory examination: Secondary | ICD-10-CM | POA: Insufficient documentation

## 2019-11-30 LAB — SARS CORONAVIRUS 2 (TAT 6-24 HRS): SARS Coronavirus 2: NEGATIVE

## 2019-12-01 NOTE — H&P (Signed)
TOTAL KNEE ADMISSION H&P  Patient is being admitted for right total knee arthroplasty.  Subjective:  Chief Complaint:right knee pain.  HPI: Tony Cruz, 86 y.o. male, has a history of pain and functional disability in the right knee due to arthritis and has failed non-surgical conservative treatments for greater than 12 weeks to includecorticosteriod injections, flexibility and strengthening excercises and supervised PT with diminished ADL's post treatment.  Onset of symptoms was gradual, starting 3 years ago with gradually worsening course since that time. The patient noted no past surgery on the right knee(s).  Patient currently rates pain in the right knee(s) at 6 out of 10 with activity. Patient has night pain, worsening of pain with activity and weight bearing, pain that interferes with activities of daily living and crepitus.  Patient has evidence of subchondral sclerosis, periarticular osteophytes and joint space narrowing by imaging studies. This patient has had no previous injury. There is no active infection.  Patient Active Problem List   Diagnosis Date Noted  . Pain in right knee 12/30/2017  . Anemia, iron deficiency 12/11/2017  . Toe ulcer ([place]) 01/13/2017  . Heterozygous factor V Leiden mutation ([place]) 01/13/2017  . DVT (deep venous thrombosis) ([place]) 08/22/2016  . History of skull fracture 10/19/2015  . History of subdural hematoma 10/19/2015  . Headache(784.0) 09/30/2013  . Sinoatrial node dysfunction ([facility]) 06/01/2013  . Pacemaker-MDT 07/02/2011  . Night sweats 07/02/2011  . DIZZINESS 03/22/2010  . Impaired fasting glucose 11/02/2009  . Obstructive sleep apnea 11/21/2008  . Backache 11/03/2008  . Overweight 11/02/2007  . Essential hypertension 11/02/2007  . ALLERGIC RHINITIS 11/02/2007  . GASTROESOPHAGEAL REFLUX DISEASE 11/02/2007  . Diverticulosis of large intestine 11/02/2007  . Benign prostatic hyperplasia with urinary obstruction 11/02/2007  . SYNCOPE 11/02/2007   . Hereditary and idiopathic peripheral neuropathy 10/30/2007  . IRRITABLE BOWEL SYNDROME 10/30/2007  . Osteoarthritis 10/30/2007   Past Medical History:  Diagnosis Date  . Allergy   . Anal fissure   . Anemia   . Backache, unspecified   . Barrett's esophagus   . Diverticulosis of colon (without mention of hemorrhage)   . DVT of leg (deep venous thrombosis) ([place])   . Esophageal reflux   . Factor V Leiden deficiency ([place])   . hypertension   . Hypertrophy of prostate with urinary obstruction and other lower urinary tract symptoms (LUTS)   . Irritable bowel syndrome   . Obstructive sleep apnea   . Osteoarthrosis, unspecified whether generalized or localized, unspecified site   . Other abnormal glucose   . Overweight(278.02)   . Pacemaker-MDT    Change out 2008  . Sleep apnea 2016   Uses cpap  . Syncope and collapse   . Tubular adenoma of colon   . Unspecified hereditary and idiopathic peripheral neuropathy     Past Surgical History:  Procedure Laterality Date  . APPENDECTOMY    . CHOLECYSTECTOMY    . PACEMAKER PLACEMENT    . PPM GENERATOR CHANGEOUT N/A 05/21/2017   Procedure: PPM GENERATOR CHANGEOUT;  Surgeon: Deboraha Sprang, MD;  Location: [facility];  Service: Cardiovascular;  Laterality: N/A;  . skin cancer removed  12/2012   removed from his face--Dr. Sarajane Jews  . TONSILLECTOMY      Current Facility-Administered Medications  Medication Dose Route Frequency Provider Last Rate Last Admin  . 0.9 %  sodium chloride infusion  500 mL Intravenous Continuous Pyrtle, Lajuan Lines, MD       Current Outpatient Medications  Medication Sig  Dispense Refill Last Dose  . acetaminophen (TYLENOL) 500 MG tablet Take 500-1,000 mg by mouth every 6 (six) hours as needed for headache (or pain).      Marland Kitchen ascorbic acid (VITAMIN C) 500 MG tablet Take 500 mg by mouth daily.      . calcium carbonate (OS-CAL) 600 MG TABS Take 600 mg by mouth daily.       . chlorthalidone (HYGROTON) 25 MG tablet  Take 1 tablet (25 mg total) by mouth daily. 90 tablet 3   . ferrous sulfate 325 (65 FE) MG tablet Take 325 mg by mouth 2 (two) times daily with a meal.      . finasteride (PROSCAR) 5 MG tablet Take 1 tablet by mouth daily.     . Lecithin 1200 MG CAPS Take 1 capsule by mouth daily.       Marland Kitchen loratadine (CLARITIN) 10 MG tablet Take 10 mg by mouth daily.       Marland Kitchen losartan (COZAAR) 100 MG tablet Take 1 tablet (100 mg total) by mouth daily. 90 tablet 3   . Multiple Vitamin (MULTIVITAMIN) capsule Take 1 capsule by mouth daily.       . naproxen (NAPROSYN) 500 MG tablet TAKE 1 TABLET TWICE A DAY 180 tablet 3   . Omega-3 Fatty Acids (FISH OIL) 1200 MG CAPS Take 1,200 mg by mouth daily.     . pantoprazole (PROTONIX) 40 MG tablet TAKE ONE TABLET DAILY. MAKEAN APPOINTMENT FOR FURTHER REFILLS (Patient taking differently: Take 40 mg by mouth daily. ) 90 tablet 3   . Tamsulosin HCl (FLOMAX) 0.4 MG CAPS Take 0.4 mg by mouth daily.       . traMADol (ULTRAM) 50 MG tablet Take 1 tablet (50 mg total) by mouth 3 (three) times daily. (Patient taking differently: Take 50 mg by mouth 2 (two) times daily. ) 90 tablet 5   . XARELTO 15 MG TABS tablet TAKE 1 TABLET DAILY WITH   SUPPER (Patient taking differently: Take 15 mg by mouth daily with supper. ) 90 tablet 0    Allergies  Allergen Reactions  . Shellfish Allergy Rash    Sensitive to shellfish    Social History   Tobacco Use  . Smoking status: Never Smoker  . Smokeless tobacco: Never Used  Substance Use Topics  . Alcohol use: Yes    Alcohol/week: 0.0 standard drinks    Comment: occasionally     Family History  Problem Relation Age of Onset  . Coronary artery disease Father        bladder cancer  . Coronary artery disease Mother   . Diabetes Sister        2 sisters     Review of Systems  Constitutional: Negative.   HENT: Negative.   Eyes: Negative.   Respiratory: Negative.   Cardiovascular: Negative.   Gastrointestinal: Negative.   Endocrine:  Negative.   Genitourinary: Negative.   Musculoskeletal: Positive for arthralgias, back pain and joint swelling.  Allergic/Immunologic: Negative.   Neurological: Negative.   Hematological: Bruises/bleeds easily.  Psychiatric/Behavioral: Negative.     Objective:  Physical Exam  Constitutional: He is oriented to person, place, and time. He appears well-developed and well-nourished. No distress.  HENT:  Head: Normocephalic and atraumatic.  Neck: No JVD present. No tracheal deviation present. No thyromegaly present.  Cardiovascular: Normal rate, regular rhythm and intact distal pulses. Exam reveals no gallop and no friction rub.  No murmur heard. Respiratory: Effort normal and breath sounds normal. No stridor.  No respiratory distress. He has no wheezes. He has no rales. He exhibits no tenderness.  Musculoskeletal:     Cervical back: Normal range of motion and neck supple.     Comments: Tenderness with palpation over medial and lateral joint line ROM 0-100 degrees No laxity with varus or valgus pressure 5/5 strength with resisted knee flexion and extension NVI in right lower extremity  Lymphadenopathy:    He has no cervical adenopathy.  Neurological: He is alert and oriented to person, place, and time.  Skin: Skin is warm and dry. No rash noted. He is not diaphoretic. No erythema. No pallor.  Psychiatric: He has a normal mood and affect. His behavior is normal. Judgment and thought content normal.    Vital signs in last 24 hours:    Labs:   Estimated body mass index is 33.39 kg/m as calculated from the following:   Height as of 11/26/19: 5' 7.5" (1.715 m).   Weight as of 11/26/19: 98.1 kg.   Imaging Review Plain radiographs demonstrate severe degenerative joint disease of the right knee(s). The overall alignment ismild varus. The bone quality appears to be fair for age and reported activity level.      Assessment/Plan:  End stage arthritis, right knee   The patient  history, physical examination, clinical judgment of the provider and imaging studies are consistent with end stage degenerative joint disease of the right knee(s) and total knee arthroplasty is deemed medically necessary. The treatment options including medical management, injection therapy arthroscopy and arthroplasty were discussed at length. The risks and benefits of total knee arthroplasty were presented and reviewed. The risks due to aseptic loosening, infection, stiffness, patella tracking problems, thromboembolic complications and other imponderables were discussed. The patient acknowledged the explanation, agreed to proceed with the plan and consent was signed. Patient is being admitted for inpatient treatment for surgery, pain control, PT, OT, prophylactic antibiotics, VTE prophylaxis, progressive ambulation and ADL's and discharge planning. The patient is planning to be discharged home with home health services Patient needs to restart Xarelto 1 day after surgery He will be doing home health PT after his surgery.     Patient's anticipated LOS is less than 2 midnights, meeting these requirements: - Younger than 48 - Lives within 1 hour of care - Has a competent adult at home to recover with post-op recover - NO history of  - Chronic pain requiring opiods  - Diabetes  - Coronary Artery Disease  - Heart failure  - Heart attack  - Stroke  - DVT/VTE  - Cardiac arrhythmia  - Respiratory Failure/COPD  - Renal failure  - Anemia  - Advanced Liver disease

## 2019-12-02 NOTE — Anesthesia Preprocedure Evaluation (Addendum)
Anesthesia Evaluation  Patient identified by MRN, date of birth, ID band Patient awake    Reviewed: Allergy & Precautions, NPO status , Patient's Chart, lab work & pertinent test results  Airway Mallampati: II  TM Distance: >3 FB Neck ROM: Full    Dental  (+) Dental Advisory Given, Teeth Intact   Pulmonary sleep apnea ,    Pulmonary exam normal breath sounds clear to auscultation       Cardiovascular hypertension, Pt. on medications Normal cardiovascular exam+ pacemaker  Rhythm:Regular Rate:Normal  Echo 2017 - Left ventricle: The cavity size was normal. Wall thickness was normal. Systolic function was normal. The estimated ejection fraction was in the range of 50% to 55%. Wall motion was normal; there were no regional wall motion abnormalities. Doppler parameters are consistent with abnormal left ventricular relaxation (grade 1 diastolic dysfunction).  - Aortic valve: There was mild regurgitation.  - Ascending aorta: The ascending aorta was mildly dilated.  - Left atrium: The atrium was mildly dilated.    Neuro/Psych  Headaches,    GI/Hepatic Neg liver ROS, GERD  ,  Endo/Other  negative endocrine ROS  Renal/GU negative Renal ROS     Musculoskeletal  (+) Arthritis ,   Abdominal (+) + obese,   Peds  Hematology  (+) Blood dyscrasia, anemia ,   Anesthesia Other Findings Day of surgery medications reviewed with the patient.  Reproductive/Obstetrics                           Anesthesia Physical Anesthesia Plan  ASA: III  Anesthesia Plan: General   Post-op Pain Management: GA combined w/ Regional for post-op pain   Induction: Intravenous  PONV Risk Score and Plan: 3 and Ondansetron, Dexamethasone and Treatment may vary due to age or medical condition  Airway Management Planned: Oral ETT  Additional Equipment: None  Intra-op Plan:   Post-operative Plan: Extubation in OR  Informed  Consent: I have reviewed the patients History and Physical, chart, labs and discussed the procedure including the risks, benefits and alternatives for the proposed anesthesia with the patient or authorized representative who has indicated his/her understanding and acceptance.     Dental advisory given  Plan Discussed with: CRNA  Anesthesia Plan Comments:        Anesthesia Quick Evaluation

## 2019-12-03 ENCOUNTER — Other Ambulatory Visit: Payer: Self-pay

## 2019-12-03 ENCOUNTER — Ambulatory Visit (HOSPITAL_COMMUNITY): Payer: Medicare HMO | Admitting: Physician Assistant

## 2019-12-03 ENCOUNTER — Observation Stay (HOSPITAL_COMMUNITY)
Admission: RE | Admit: 2019-12-03 | Discharge: 2019-12-05 | Disposition: A | Discharge status: Home / Self Care | Payer: Medicare HMO | Attending: Specialist | Admitting: Specialist

## 2019-12-03 ENCOUNTER — Encounter (HOSPITAL_COMMUNITY): Payer: Self-pay | Admitting: Specialist

## 2019-12-03 ENCOUNTER — Encounter (HOSPITAL_COMMUNITY)
Admission: RE | Disposition: A | Discharge status: Home / Self Care | Payer: Self-pay | Source: Home / Self Care | Attending: Specialist

## 2019-12-03 DIAGNOSIS — E669 Obesity, unspecified: Secondary | ICD-10-CM | POA: Diagnosis not present

## 2019-12-03 DIAGNOSIS — Z8249 Family history of ischemic heart disease and other diseases of the circulatory system: Secondary | ICD-10-CM | POA: Diagnosis not present

## 2019-12-03 DIAGNOSIS — K219 Gastro-esophageal reflux disease without esophagitis: Secondary | ICD-10-CM | POA: Diagnosis not present

## 2019-12-03 DIAGNOSIS — Z86718 Personal history of other venous thrombosis and embolism: Secondary | ICD-10-CM | POA: Insufficient documentation

## 2019-12-03 DIAGNOSIS — M1711 Unilateral primary osteoarthritis, right knee: Principal | ICD-10-CM | POA: Diagnosis present

## 2019-12-03 DIAGNOSIS — G4733 Obstructive sleep apnea (adult) (pediatric): Secondary | ICD-10-CM | POA: Diagnosis not present

## 2019-12-03 DIAGNOSIS — Z79899 Other long term (current) drug therapy: Secondary | ICD-10-CM | POA: Diagnosis not present

## 2019-12-03 DIAGNOSIS — G609 Hereditary and idiopathic neuropathy, unspecified: Secondary | ICD-10-CM | POA: Diagnosis not present

## 2019-12-03 DIAGNOSIS — K589 Irritable bowel syndrome without diarrhea: Secondary | ICD-10-CM | POA: Insufficient documentation

## 2019-12-03 DIAGNOSIS — Z950 Presence of cardiac pacemaker: Secondary | ICD-10-CM | POA: Insufficient documentation

## 2019-12-03 DIAGNOSIS — Z7901 Long term (current) use of anticoagulants: Secondary | ICD-10-CM | POA: Insufficient documentation

## 2019-12-03 DIAGNOSIS — Z791 Long term (current) use of non-steroidal anti-inflammatories (NSAID): Secondary | ICD-10-CM | POA: Insufficient documentation

## 2019-12-03 DIAGNOSIS — I10 Essential (primary) hypertension: Secondary | ICD-10-CM | POA: Insufficient documentation

## 2019-12-03 DIAGNOSIS — Z6833 Body mass index (BMI) 33.0-33.9, adult: Secondary | ICD-10-CM | POA: Diagnosis not present

## 2019-12-03 DIAGNOSIS — N400 Enlarged prostate without lower urinary tract symptoms: Secondary | ICD-10-CM | POA: Diagnosis not present

## 2019-12-03 DIAGNOSIS — J309 Allergic rhinitis, unspecified: Secondary | ICD-10-CM | POA: Diagnosis not present

## 2019-12-03 DIAGNOSIS — D6851 Activated protein C resistance: Secondary | ICD-10-CM | POA: Insufficient documentation

## 2019-12-03 DIAGNOSIS — G8918 Other acute postprocedural pain: Secondary | ICD-10-CM | POA: Diagnosis not present

## 2019-12-03 HISTORY — PX: TOTAL KNEE ARTHROPLASTY: SHX125

## 2019-12-03 LAB — PROTIME-INR
INR: 1.1 (ref 0.8–1.2)
Prothrombin Time: 13.9 seconds (ref 11.4–15.2)

## 2019-12-03 LAB — ABO/RH: ABO/RH(D): A POS

## 2019-12-03 LAB — APTT: aPTT: 28 seconds (ref 24–36)

## 2019-12-03 LAB — TYPE AND SCREEN
ABO/RH(D): A POS
Antibody Screen: NEGATIVE

## 2019-12-03 SURGERY — ARTHROPLASTY, KNEE, TOTAL
Anesthesia: General | Site: Knee | Laterality: Right

## 2019-12-03 MED ORDER — BISACODYL 5 MG PO TBEC
5.0000 mg | DELAYED_RELEASE_TABLET | Freq: Every day | ORAL | Status: DC | PRN
  Filled 2019-12-03: qty 1

## 2019-12-03 MED ORDER — PANTOPRAZOLE SODIUM 40 MG PO TBEC
40.0000 mg | DELAYED_RELEASE_TABLET | Freq: Every day | ORAL | Status: DC
  Administered 2019-12-04 – 2019-12-05 (×2): 40 mg via ORAL
  Filled 2019-12-03 (×3): qty 1

## 2019-12-03 MED ORDER — OXYCODONE HCL 5 MG PO TABS: 5.0000 mg | ORAL_TABLET | ORAL | 0 refills | Status: DC | PRN

## 2019-12-03 MED ORDER — PHENYLEPHRINE 40 MCG/ML (10ML) SYRINGE FOR IV PUSH (FOR BLOOD PRESSURE SUPPORT)
PREFILLED_SYRINGE | INTRAVENOUS | Status: AC
  Filled 2019-12-03: qty 20

## 2019-12-03 MED ORDER — BUPIVACAINE LIPOSOME 1.3 % IJ SUSP
20.0000 mL | Freq: Once | INTRAMUSCULAR | Status: AC
  Administered 2019-12-03: 20 mL
  Filled 2019-12-03: qty 20

## 2019-12-03 MED ORDER — SODIUM CHLORIDE (PF) 0.9 % IJ SOLN
INTRAMUSCULAR | Status: AC
  Filled 2019-12-03: qty 10

## 2019-12-03 MED ORDER — FENTANYL CITRATE (PF) 100 MCG/2ML IJ SOLN
INTRAMUSCULAR | Status: AC
  Filled 2019-12-03: qty 2

## 2019-12-03 MED ORDER — CLONIDINE HCL (ANALGESIA) 100 MCG/ML EP SOLN
EPIDURAL | Status: DC | PRN
  Administered 2019-12-03: 80 ug

## 2019-12-03 MED ORDER — LIDOCAINE 2% (20 MG/ML) 5 ML SYRINGE
INTRAMUSCULAR | Status: DC | PRN
  Administered 2019-12-03: 60 mg via INTRAVENOUS

## 2019-12-03 MED ORDER — PROPOFOL 500 MG/50ML IV EMUL
INTRAVENOUS | Status: AC
  Filled 2019-12-03: qty 50

## 2019-12-03 MED ORDER — METHOCARBAMOL 500 MG PO TABS: 500.0000 mg | ORAL_TABLET | Freq: Four times a day (QID) | ORAL | 0 refills | Status: DC

## 2019-12-03 MED ORDER — DIPHENHYDRAMINE HCL 12.5 MG/5ML PO ELIX
12.5000 mg | ORAL_SOLUTION | ORAL | Status: DC | PRN
  Administered 2019-12-04: 12.5 mg via ORAL
  Filled 2019-12-03: qty 10
  Filled 2019-12-03: qty 5

## 2019-12-03 MED ORDER — ACETAMINOPHEN 500 MG PO TABS
1000.0000 mg | ORAL_TABLET | Freq: Four times a day (QID) | ORAL | Status: AC
  Administered 2019-12-03 (×2): 1000 mg via ORAL
  Filled 2019-12-03 (×2): qty 2

## 2019-12-03 MED ORDER — HYDROMORPHONE HCL 1 MG/ML IJ SOLN
INTRAMUSCULAR | Status: AC
  Administered 2019-12-03: 0.25 mg via INTRAVENOUS
  Filled 2019-12-03: qty 1

## 2019-12-03 MED ORDER — SENNOSIDES-DOCUSATE SODIUM 8.6-50 MG PO TABS
1.0000 | ORAL_TABLET | Freq: Every evening | ORAL | Status: DC | PRN
  Filled 2019-12-03: qty 1

## 2019-12-03 MED ORDER — LOSARTAN POTASSIUM 50 MG PO TABS
100.0000 mg | ORAL_TABLET | Freq: Every day | ORAL | Status: DC
  Administered 2019-12-04 – 2019-12-05 (×2): 100 mg via ORAL
  Filled 2019-12-03 (×2): qty 2

## 2019-12-03 MED ORDER — LIDOCAINE 2% (20 MG/ML) 5 ML SYRINGE
INTRAMUSCULAR | Status: AC
  Filled 2019-12-03: qty 5

## 2019-12-03 MED ORDER — SODIUM CHLORIDE 0.9 % IV BOLUS
500.0000 mL | Freq: Once | INTRAVENOUS | Status: AC
  Administered 2019-12-03: 500 mL via INTRAVENOUS

## 2019-12-03 MED ORDER — LACTATED RINGERS IV SOLN: INTRAVENOUS | Status: DC

## 2019-12-03 MED ORDER — 0.9 % SODIUM CHLORIDE (POUR BTL) OPTIME
TOPICAL | Status: DC | PRN
  Administered 2019-12-03: 1000 mL

## 2019-12-03 MED ORDER — MEPERIDINE HCL 50 MG/ML IJ SOLN: 6.2500 mg | INTRAMUSCULAR | Status: DC | PRN

## 2019-12-03 MED ORDER — ONDANSETRON HCL 4 MG PO TABS
4.0000 mg | ORAL_TABLET | Freq: Four times a day (QID) | ORAL | Status: DC | PRN
  Filled 2019-12-03: qty 1

## 2019-12-03 MED ORDER — METHOCARBAMOL 500 MG IVPB - SIMPLE MED
500.0000 mg | Freq: Four times a day (QID) | INTRAVENOUS | Status: DC | PRN
  Filled 2019-12-03: qty 50

## 2019-12-03 MED ORDER — ONDANSETRON HCL 4 MG/2ML IJ SOLN
4.0000 mg | Freq: Four times a day (QID) | INTRAMUSCULAR | Status: DC | PRN
  Administered 2019-12-03: 4 mg via INTRAVENOUS

## 2019-12-03 MED ORDER — TRAMADOL HCL 50 MG PO TABS
50.0000 mg | ORAL_TABLET | Freq: Four times a day (QID) | ORAL | Status: DC
  Administered 2019-12-03 – 2019-12-05 (×6): 50 mg via ORAL
  Filled 2019-12-03 (×6): qty 1

## 2019-12-03 MED ORDER — ACETAMINOPHEN 325 MG PO TABS
325.0000 mg | ORAL_TABLET | Freq: Four times a day (QID) | ORAL | Status: DC | PRN
  Administered 2019-12-04: 650 mg via ORAL
  Filled 2019-12-03 (×2): qty 2

## 2019-12-03 MED ORDER — ONDANSETRON HCL 4 MG PO TABS: 4.0000 mg | ORAL_TABLET | Freq: Three times a day (TID) | ORAL | 1 refills | Status: DC | PRN

## 2019-12-03 MED ORDER — OXYCODONE HCL 5 MG PO TABS
5.0000 mg | ORAL_TABLET | ORAL | Status: DC | PRN
  Administered 2019-12-04 (×2): 5 mg via ORAL
  Administered 2019-12-05: 10 mg via ORAL
  Filled 2019-12-03: qty 1
  Filled 2019-12-03: qty 2
  Filled 2019-12-03: qty 1
  Filled 2019-12-03: qty 2

## 2019-12-03 MED ORDER — DEXAMETHASONE SODIUM PHOSPHATE 10 MG/ML IJ SOLN
INTRAMUSCULAR | Status: AC
  Filled 2019-12-03: qty 1

## 2019-12-03 MED ORDER — DEXAMETHASONE SODIUM PHOSPHATE 10 MG/ML IJ SOLN
8.0000 mg | Freq: Once | INTRAMUSCULAR | Status: AC
  Administered 2019-12-03: 4 mg via INTRAVENOUS

## 2019-12-03 MED ORDER — IRRISEPT - 450ML BOTTLE WITH 0.05% CHG IN STERILE WATER, USP 99.95% OPTIME
TOPICAL | Status: DC | PRN
  Administered 2019-12-03: 450 mL via TOPICAL

## 2019-12-03 MED ORDER — METHOCARBAMOL 500 MG PO TABS
500.0000 mg | ORAL_TABLET | Freq: Four times a day (QID) | ORAL | Status: DC | PRN
  Administered 2019-12-04 – 2019-12-05 (×3): 500 mg via ORAL
  Filled 2019-12-03 (×3): qty 1

## 2019-12-03 MED ORDER — POVIDONE-IODINE 10 % EX SWAB: 2.0000 "application " | Freq: Once | CUTANEOUS | Status: DC

## 2019-12-03 MED ORDER — PHENYLEPHRINE 40 MCG/ML (10ML) SYRINGE FOR IV PUSH (FOR BLOOD PRESSURE SUPPORT)
PREFILLED_SYRINGE | INTRAVENOUS | Status: DC | PRN
  Administered 2019-12-03 (×8): 80 ug via INTRAVENOUS

## 2019-12-03 MED ORDER — MAGNESIUM CITRATE PO SOLN
1.0000 | Freq: Once | ORAL | Status: DC | PRN
  Filled 2019-12-03: qty 296

## 2019-12-03 MED ORDER — HYDROMORPHONE HCL 1 MG/ML IJ SOLN: 0.2500 mg | INTRAMUSCULAR | Status: DC | PRN

## 2019-12-03 MED ORDER — SODIUM CHLORIDE (PF) 0.9 % IJ SOLN
INTRAMUSCULAR | Status: DC | PRN
  Administered 2019-12-03: 60 mL

## 2019-12-03 MED ORDER — EPHEDRINE 5 MG/ML INJ
INTRAVENOUS | Status: AC
  Filled 2019-12-03: qty 10

## 2019-12-03 MED ORDER — PHENYLEPHRINE 40 MCG/ML (10ML) SYRINGE FOR IV PUSH (FOR BLOOD PRESSURE SUPPORT)
PREFILLED_SYRINGE | INTRAVENOUS | Status: AC
  Filled 2019-12-03: qty 10

## 2019-12-03 MED ORDER — ONDANSETRON HCL 4 MG/2ML IJ SOLN
INTRAMUSCULAR | Status: AC
  Filled 2019-12-03: qty 2

## 2019-12-03 MED ORDER — HYDROMORPHONE HCL 1 MG/ML IJ SOLN: 0.5000 mg | INTRAMUSCULAR | Status: DC | PRN

## 2019-12-03 MED ORDER — ALBUMIN HUMAN 5 % IV SOLN
INTRAVENOUS | Status: AC
  Administered 2019-12-03: 12.5 g via INTRAVENOUS
  Filled 2019-12-03: qty 500

## 2019-12-03 MED ORDER — STERILE WATER FOR IRRIGATION IR SOLN
Status: DC | PRN
  Administered 2019-12-03: 2000 mL

## 2019-12-03 MED ORDER — SODIUM CHLORIDE (PF) 0.9 % IJ SOLN
INTRAMUSCULAR | Status: AC
  Filled 2019-12-03: qty 50

## 2019-12-03 MED ORDER — ROCURONIUM BROMIDE 10 MG/ML (PF) SYRINGE
PREFILLED_SYRINGE | INTRAVENOUS | Status: DC | PRN
  Administered 2019-12-03 (×2): 10 mg via INTRAVENOUS
  Administered 2019-12-03: 50 mg via INTRAVENOUS
  Administered 2019-12-03: 20 mg via INTRAVENOUS

## 2019-12-03 MED ORDER — OXYCODONE HCL 5 MG PO TABS
10.0000 mg | ORAL_TABLET | ORAL | Status: DC | PRN
  Administered 2019-12-05: 10 mg via ORAL

## 2019-12-03 MED ORDER — ONDANSETRON HCL 4 MG/2ML IJ SOLN: 4.0000 mg | Freq: Once | INTRAMUSCULAR | Status: DC | PRN

## 2019-12-03 MED ORDER — SODIUM CHLORIDE 0.9 % IR SOLN
Status: DC | PRN
  Administered 2019-12-03: 1000 mL

## 2019-12-03 MED ORDER — FENTANYL CITRATE (PF) 100 MCG/2ML IJ SOLN
INTRAMUSCULAR | Status: DC | PRN
  Administered 2019-12-03 (×2): 50 ug via INTRAVENOUS

## 2019-12-03 MED ORDER — ALBUMIN HUMAN 5 % IV SOLN
12.5000 g | Freq: Once | INTRAVENOUS | Status: AC
  Administered 2019-12-03: 12.5 g via INTRAVENOUS

## 2019-12-03 MED ORDER — CEFAZOLIN SODIUM-DEXTROSE 2-4 GM/100ML-% IV SOLN
2.0000 g | INTRAVENOUS | Status: AC
  Administered 2019-12-03: 2 g via INTRAVENOUS
  Filled 2019-12-03: qty 100

## 2019-12-03 MED ORDER — SODIUM CHLORIDE 0.9 % IV SOLN
INTRAVENOUS | Status: DC
  Administered 2019-12-03 (×2): 1000 mL via INTRAVENOUS

## 2019-12-03 MED ORDER — BUPIVACAINE-EPINEPHRINE (PF) 0.5% -1:200000 IJ SOLN
INTRAMUSCULAR | Status: DC | PRN
  Administered 2019-12-03: 25 mL via PERINEURAL

## 2019-12-03 MED ORDER — PROPOFOL 10 MG/ML IV BOLUS
INTRAVENOUS | Status: DC | PRN
  Administered 2019-12-03: 150 mg via INTRAVENOUS
  Administered 2019-12-03: 20 mg via INTRAVENOUS
  Administered 2019-12-03: 150 mg via INTRAVENOUS

## 2019-12-03 MED ORDER — EPHEDRINE SULFATE-NACL 50-0.9 MG/10ML-% IV SOSY
PREFILLED_SYRINGE | INTRAVENOUS | Status: DC | PRN
  Administered 2019-12-03 (×2): 10 mg via INTRAVENOUS

## 2019-12-03 MED ORDER — METHOCARBAMOL 500 MG IVPB - SIMPLE MED
INTRAVENOUS | Status: AC
  Administered 2019-12-03: 500 mg via INTRAVENOUS
  Filled 2019-12-03: qty 50

## 2019-12-03 MED ORDER — RIVAROXABAN 15 MG PO TABS
15.0000 mg | ORAL_TABLET | Freq: Every day | ORAL | Status: DC
  Administered 2019-12-04: 15 mg via ORAL
  Filled 2019-12-03: qty 1

## 2019-12-03 MED ORDER — ALBUMIN HUMAN 5 % IV SOLN: 25.0000 g | Freq: Once | INTRAVENOUS | Status: AC

## 2019-12-03 MED ORDER — GABAPENTIN 300 MG PO CAPS
300.0000 mg | ORAL_CAPSULE | Freq: Three times a day (TID) | ORAL | Status: DC
  Administered 2019-12-03 – 2019-12-05 (×6): 300 mg via ORAL
  Filled 2019-12-03 (×6): qty 1

## 2019-12-03 SURGICAL SUPPLY — 67 items
ADH SKN CLS APL DERMABOND .7 (GAUZE/BANDAGES/DRESSINGS) ×1
ATTUNE MED DOME PAT 38 KNEE (Knees) ×1 IMPLANT
ATTUNE PS FEM RT SZ 7 CEM KNEE (Femur) ×1 IMPLANT
ATTUNE PSRP INSR SZ7 6 KNEE (Insert) ×1 IMPLANT
BAG SPEC THK2 15X12 ZIP CLS (MISCELLANEOUS) ×1
BAG ZIPLOCK 12X15 (MISCELLANEOUS) ×2 IMPLANT
BASE TIBIAL ROT PLAT SZ 8 KNEE (Knees) IMPLANT
BLADE SAG 18X100X1.27 (BLADE) ×2 IMPLANT
BLADE SAW SGTL 11.0X1.19X90.0M (BLADE) ×2 IMPLANT
BNDG ELASTIC 4X5.8 VLCR STR LF (GAUZE/BANDAGES/DRESSINGS) ×2 IMPLANT
BNDG ELASTIC 6X5.8 VLCR STR LF (GAUZE/BANDAGES/DRESSINGS) ×2 IMPLANT
BOWL SMART MIX CTS (DISPOSABLE) ×2 IMPLANT
BSPLAT TIB 8 CMNT ROT PLAT STR (Knees) ×1 IMPLANT
CEMENT HV SMART SET (Cement) ×2 IMPLANT
COVER SURGICAL LIGHT HANDLE (MISCELLANEOUS) ×2 IMPLANT
COVER WAND RF STERILE (DRAPES) ×2 IMPLANT
CUFF TOURN SGL QUICK 34 (TOURNIQUET CUFF) ×2
CUFF TRNQT CYL 34X4.125X (TOURNIQUET CUFF) ×1 IMPLANT
DECANTER SPIKE VIAL GLASS SM (MISCELLANEOUS) ×3 IMPLANT
DERMABOND ADVANCED (GAUZE/BANDAGES/DRESSINGS) ×1
DERMABOND ADVANCED .7 DNX12 (GAUZE/BANDAGES/DRESSINGS) ×1 IMPLANT
DRAPE U-SHAPE 47X51 STRL (DRAPES) ×2 IMPLANT
DRSG AQUACEL AG ADV 3.5X10 (GAUZE/BANDAGES/DRESSINGS) ×2 IMPLANT
DRSG TEGADERM 4X4.75 (GAUZE/BANDAGES/DRESSINGS) ×2 IMPLANT
DURAPREP 26ML APPLICATOR (WOUND CARE) ×4 IMPLANT
ELECT REM PT RETURN 15FT ADLT (MISCELLANEOUS) ×2 IMPLANT
EVACUATOR 1/8 PVC DRAIN (DRAIN) ×2 IMPLANT
GAUZE SPONGE 2X2 8PLY STRL LF (GAUZE/BANDAGES/DRESSINGS) ×1 IMPLANT
GLOVE BIOGEL PI IND STRL 7.5 (GLOVE) ×1 IMPLANT
GLOVE BIOGEL PI IND STRL 8 (GLOVE) ×1 IMPLANT
GLOVE BIOGEL PI INDICATOR 7.5 (GLOVE) ×1
GLOVE BIOGEL PI INDICATOR 8 (GLOVE) ×1
GLOVE ECLIPSE 8.0 STRL XLNG CF (GLOVE) ×2 IMPLANT
GLOVE SURG ORTHO 9.0 STRL STRW (GLOVE) ×2 IMPLANT
GLOVE SURG SS PI 7.0 STRL IVOR (GLOVE) ×2 IMPLANT
GOWN STRL REUS W/TWL XL LVL3 (GOWN DISPOSABLE) ×4 IMPLANT
HANDPIECE INTERPULSE COAX TIP (DISPOSABLE) ×2
JET LAVAGE IRRISEPT WOUND (IRRIGATION / IRRIGATOR) ×2
KIT TURNOVER KIT A (KITS) ×2 IMPLANT
LAVAGE JET IRRISEPT WOUND (IRRIGATION / IRRIGATOR) ×1 IMPLANT
NS IRRIG 1000ML POUR BTL (IV SOLUTION) ×2 IMPLANT
PACK TOTAL KNEE CUSTOM (KITS) ×2 IMPLANT
PENCIL SMOKE EVACUATOR (MISCELLANEOUS) ×1 IMPLANT
PIN DRILL FIX HALF THREAD (BIT) ×1 IMPLANT
PIN STEINMAN FIXATION KNEE (PIN) ×1 IMPLANT
PROTECTOR NERVE ULNAR (MISCELLANEOUS) ×2 IMPLANT
SET HNDPC FAN SPRY TIP SCT (DISPOSABLE) ×1 IMPLANT
SET PAD KNEE POSITIONER (MISCELLANEOUS) ×2 IMPLANT
SPONGE GAUZE 2X2 STER 10/PKG (GAUZE/BANDAGES/DRESSINGS) ×1
SPONGE LAP 18X18 RF (DISPOSABLE) IMPLANT
SPONGE SURGIFOAM ABS GEL 100 (HEMOSTASIS) ×2 IMPLANT
STOCKINETTE 6  STRL (DRAPES) ×2
STOCKINETTE 6 STRL (DRAPES) ×1 IMPLANT
SUT BONE WAX W31G (SUTURE) IMPLANT
SUT MNCRL AB 3-0 PS2 18 (SUTURE) ×2 IMPLANT
SUT VIC AB 1 CT1 27 (SUTURE) ×6
SUT VIC AB 1 CT1 27XBRD ANTBC (SUTURE) ×3 IMPLANT
SUT VIC AB 2-0 CT1 27 (SUTURE) ×4
SUT VIC AB 2-0 CT1 TAPERPNT 27 (SUTURE) ×2 IMPLANT
SUT VLOC 180 0 24IN GS25 (SUTURE) ×2 IMPLANT
SYR 50ML LL SCALE MARK (SYRINGE) ×2 IMPLANT
TAPE STRIPS DRAPE STRL (GAUZE/BANDAGES/DRESSINGS) ×2 IMPLANT
TIBIAL BASE ROT PLAT SZ 8 KNEE (Knees) ×2 IMPLANT
TRAY CATH 16FR W/PLASTIC CATH (SET/KITS/TRAYS/PACK) ×2 IMPLANT
WATER STERILE IRR 1000ML POUR (IV SOLUTION) ×4 IMPLANT
WRAP KNEE MAXI GEL POST OP (GAUZE/BANDAGES/DRESSINGS) ×2 IMPLANT
YANKAUER SUCT BULB TIP 10FT TU (MISCELLANEOUS) ×2 IMPLANT

## 2019-12-03 NOTE — Progress Notes (Signed)
Raquel Sarna pleasant  RN received updated report.

## 2019-12-03 NOTE — Op Note (Signed)
DATE OF SURGERY:  12/03/2019  TIME: 9:50 AM  PATIENT NAME:  Tony Cruz    AGE: 86 y.o.   PRE-OPERATIVE DIAGNOSIS:  Right knee osteoarthritis  POST-OPERATIVE DIAGNOSIS:  Right knee osteoarthritis  PROCEDURE:  Procedure(s): TOTAL KNEE ARTHROPLASTY  SURGEON:  Muhammad Vacca ANDREW  ASSISTANT:  Leeanne Haus, PA-C, present and scrubbed throughout the case, critical for assistance with exposure, retraction, instrumentation, and closure.  OPERATIVE IMPLANTS: Depuy PFC Attune Rotating Platform.  Femur size 7, Tibia size 8, Patella size 38 3-peg oval button, with a 6 mm polyethylene insert.   PREOPERATIVE INDICATIONS:   Tony Cruz is a 86 y.o. year old male with end stage bone on bone arthritis of the knee who failed conservative treatment and elected for Total Knee Arthroplasty.   The risks, benefits, and alternatives were discussed at length including but not limited to the risks of infection, bleeding, nerve injury, stiffness, blood clots, the need for revision surgery, cardiopulmonary complications, among others, and they were willing to proceed.  OPERATIVE DESCRIPTION:  The patient was brought to the operative room and placed in a supine position.  Spinal anesthesia was administered.  IV antibiotics were given.  The lower extremity was prepped and draped in the usual sterile fashion.  Time out was performed.  The leg was elevated and exsanguinated and the tourniquet was inflated.  Anterior quadriceps tendon splitting approach was performed.  The patella was retracted and osteophytes were removed.  The anterior horn of the medial and lateral meniscus was removed and cruciate ligaments resected.   The distal femur was opened with the drill and the intramedullary distal femoral cutting jig was utilized, set at 5 degrees resecting 10 mm off the distal femur.  Care was taken to protect the collateral ligaments.  The distal femoral sizing jig was applied, taking care to avoid  notching.  Then the 4-in-1 cutting jig was applied and the anterior and posterior femur was cut, along with the chamfer cuts.    Then the extramedullary tibial cutting jig was utilized making the appropriate cut using the anterior tibial crest as a reference building in appropriate posterior slope.  Care was taken during the cut to protect the medial and collateral ligaments.  The proximal tibia was removed along with the posterior horns of the menisci.   The posterior medial femoral osteophytes and posterior lateral femoral osteophytes were removed.    The flexion gap was then measured and was symmetric with the extension gap, measured at 6.  I completed the distal femoral preparation using the appropriate jig to prepare the box.  The patella was then measured, and cut with the saw.    The proximal tibia sized and prepared accordingly with the reamer and the punch, and then all components were trialed with the trial insert.  The knee was found to have excellent balance and full motion.    The above named components were then cemented into place and all excess cement was removed.  The trial polyethylene component was in place during cementation, and then was exchanged for the real polyethylene component.    The knee was easily taken through a range of motion and the patella tracked well and the knee irrigated copiously and the parapatellar and subcutaneous tissue closed with vicryl, and monocryl with steri strips for the skin.  The arthrotomy was closed at 90 of flexion. The wounds were dressed with sterile gauze and the tourniquet released and the patient was awakened and returned to the PACU in  stable and satisfactory condition.  There were no complications.  Total tourniquet time was 90 minutes.

## 2019-12-03 NOTE — Progress Notes (Signed)
House coverage at bedside and assessed pt VSS as upon admission.  Pt eating lunch.  Family at bedside.

## 2019-12-03 NOTE — Progress Notes (Signed)
Patient care resumed by this nurse- educated patient about dc process tomorrow, bp management, blood clot prevention. scds and teds on. Family at bedside with patient. All questions answered appropriately.

## 2019-12-03 NOTE — Progress Notes (Addendum)
Dr Lissa Hoard reviewd BP and authorozed transfer to floor.

## 2019-12-03 NOTE — Progress Notes (Signed)
Physical Therapy Treatment Patient Details Name: Tony Cruz MRN: [id number] DOB: [birth year] Today's Date: 12/03/2019    History of Present Illness Pt s/p R TKR and with hx of pacemaker and peripheral neuropathy    PT Comments    Pt very motivated and up to ambulate short distance in hall - pt denies dizziness but states "I just feel a little foggy".   Follow Up Recommendations  Home health PT;Follow surgeon's recommendation for DC plan and follow-up therapies     Equipment Recommendations  Rolling walker with 5" wheels;3in1 (PT)    Recommendations for Other Services       Precautions / Restrictions Precautions Precautions: Fall;Knee Restrictions Weight Bearing Restrictions: No Other Position/Activity Restrictions: WBAT    Mobility  Bed Mobility Overal bed mobility: Needs Assistance Bed Mobility: Supine to Sit     Supine to sit: Min assist     General bed mobility comments: NT - on BSC and up in chair at session end  Transfers Overall transfer level: Needs assistance Equipment used: Rolling walker (2 wheeled) Transfers: Sit to/from Stand Sit to Stand: Min assist Stand pivot transfers: Min assist       General transfer comment: cues for LE management and use of UEs to self assist;   Ambulation/Gait Ambulation/Gait assistance: Min assist Gait Distance (Feet): 49 Feet Assistive device: Rolling walker (2 wheeled) Gait Pattern/deviations: Step-to pattern;Decreased step length - right;Decreased step length - left;Shuffle;Trunk flexed Gait velocity: decr   General Gait Details: cues for sequence, posture and position from [employer]             Wheelchair Mobility    Modified Rankin (Stroke Patients Only)       Balance Overall balance assessment: Needs assistance Sitting-balance support: Feet supported;No upper extremity supported Sitting balance-Leahy Scale: Fair     Standing balance support: Bilateral upper extremity supported Standing  balance-Leahy Scale: Poor                              Cognition Arousal/Alertness: Awake/alert Behavior During Therapy: WFL for tasks assessed/performed;Impulsive Overall Cognitive Status: Within Functional Limits for tasks assessed                                        Exercises Total Joint Exercises Ankle Circles/Pumps: AROM;Both;15 reps;Supine    General Comments        Pertinent Vitals/Pain Pain Assessment: 0-10 Pain Score: 3  Pain Location: R knee Pain Descriptors / Indicators: Aching;Sore Pain Intervention(s): Limited activity within patient's tolerance;Monitored during session;Premedicated before session;Ice applied    Home Living Family/patient expects to be discharged to:: Private residence Living Arrangements: Spouse/significant other Available Help at Discharge: Family Type of Home: House Home Access: Stairs to enter Entrance Stairs-Rails: Right;Left Home Layout: Able to live on main level with bedroom/bathroom Home Equipment: Kasandra Knudsen - single point      Prior Function Level of Independence: Independent;Independent with assistive device(s)      Comments: used cane on uneven terrain   PT Goals (current goals can now be found in the care plan section) Acute Rehab PT Goals Patient Stated Goal: Get back to golfing PT Goal Formulation: With patient Time For Goal Achievement: 12/10/19 Potential to Achieve Goals: Good Progress towards PT goals: Progressing toward goals    Frequency    7X/week  PT Plan Current plan remains appropriate    Co-evaluation              AM-PAC PT "6 Clicks" Mobility   Outcome Measure  Help needed turning from your back to your side while in a flat bed without using bedrails?: A Little Help needed moving from lying on your back to sitting on the side of a flat bed without using bedrails?: A Little Help needed moving to and from a bed to a chair (including a wheelchair)?: A Little Help  needed standing up from a chair using your arms (e.g., wheelchair or bedside chair)?: A Little Help needed to walk in hospital room?: A Little Help needed climbing 3-5 steps with a railing? : A Lot 6 Click Score: 17    End of Session Equipment Utilized During Treatment: Gait belt Activity Tolerance: Patient tolerated treatment well Patient left: in chair;with call bell/phone within reach;with family/visitor present;with chair alarm set Nurse Communication: Mobility status;Other (comment) PT Visit Diagnosis: Difficulty in walking, not elsewhere classified (R26.2)     Time: [id number] PT Time Calculation (min) (ACUTE ONLY): 13 min  Charges:  $Gait Training: 8-22 mins                     [place] Pager [phone] Office [phone]    Hartlee Amedee 12/03/2019, 5:12 PM

## 2019-12-03 NOTE — Anesthesia Procedure Notes (Signed)
Anesthesia Regional Block: Adductor canal block   Pre-Anesthetic Checklist: ,, timeout performed, Correct Patient, Correct Site, Correct Laterality, Correct Procedure, Correct Position, site marked, Risks and benefits discussed,  Surgical consent,  Pre-op evaluation,  At surgeon's request and post-op pain management  Laterality: Right  Prep: chloraprep       Needles:  Injection technique: Single-shot  Needle Type: Stimiplex     Needle Length: 9cm  Needle Gauge: 21     Additional Needles:   Procedures:,,,, ultrasound used (permanent image in chart),,,,  Narrative:  Start time: 12/03/2019 7:20 AM End time: 12/03/2019 7:25 AM Injection made incrementally with aspirations every 5 mL.  Performed by: Personally  Anesthesiologist: Nolon Nations, MD  Additional Notes: BP cuff, EKG monitors applied. Sedation begun. Artery and nerve location verified with U/S and anesthetic injected incrementally, slowly, and after negative aspirations under direct u/s guidance. Good fascial /perineural spread. Tolerated well.

## 2019-12-03 NOTE — Evaluation (Signed)
Physical Therapy Evaluation Patient Details Name: Tony Cruz MRN: [id number] DOB: 1934/03/18 Today's Date: 12/03/2019   History of Present Illness  Pt s/p R TKR and with hx of pacemaker and peripheral neuropathy  Clinical Impression  Pt s/p R TKR and presents with decreased R LE strength/ROM and post op pain limiting functional mobility.  Pt should progress well to dc home with assist of family.    Follow Up Recommendations Home health PT;Follow surgeon's recommendation for DC plan and follow-up therapies    Equipment Recommendations  Rolling walker with 5" wheels;3in1 (PT)    Recommendations for Other Services       Precautions / Restrictions Precautions Precautions: Fall;Knee Restrictions Weight Bearing Restrictions: No Other Position/Activity Restrictions: WBAT      Mobility  Bed Mobility Overal bed mobility: Needs Assistance Bed Mobility: Supine to Sit     Supine to sit: Min assist     General bed mobility comments: cues for sequence and use of L LE to self assist  Transfers Overall transfer level: Needs assistance Equipment used: Rolling walker (2 wheeled) Transfers: Sit to/from [facility] Sit to Stand: Min assist Stand pivot transfers: Min assist       General transfer comment: cues for LE management and use of UEs to self assist; stand/pvt transfer bed to [facility]  Ambulation/Gait Ambulation/Gait assistance: Min assist           General Gait Details: Stand pvt to [facility] only  Stairs            Wheelchair Mobility    Modified Rankin (Stroke Patients Only)       Balance Overall balance assessment: Needs assistance Sitting-balance support: Feet supported;No upper extremity supported Sitting balance-Leahy Scale: Fair     Standing balance support: Bilateral upper extremity supported Standing balance-Leahy Scale: Poor                               Pertinent Vitals/Pain Pain Assessment: 0-10 Pain Score: 3   Pain Location: R knee Pain Descriptors / Indicators: Aching;Sore Pain Intervention(s): Limited activity within patient's tolerance;Monitored during session;Premedicated before session;Ice applied    Home Living Family/patient expects to be discharged to:: Private residence Living Arrangements: Spouse/significant other Available Help at Discharge: Family Type of Home: House Home Access: Stairs to enter Entrance Stairs-Rails: Psychiatric nurse of Steps: 2 Home Layout: Able to live on main level with bedroom/bathroom Home Equipment: Cane - single point      Prior Function Level of Independence: Independent;Independent with assistive device(s)         Comments: used cane on uneven terrain     Hand Dominance        Extremity/Trunk Assessment   Upper Extremity Assessment Upper Extremity Assessment: Overall WFL for tasks assessed    Lower Extremity Assessment Lower Extremity Assessment: RLE deficits/detail    Cervical / Trunk Assessment Cervical / Trunk Assessment: Normal  Communication   Communication: No difficulties  Cognition Arousal/Alertness: Awake/alert Behavior During Therapy: WFL for tasks assessed/performed Overall Cognitive Status: Within Functional Limits for tasks assessed                                        General Comments      Exercises Total Joint Exercises Ankle Circles/Pumps: AROM;Both;15 reps;Supine   Assessment/Plan    PT Assessment Patient needs continued PT  services  PT Problem List Decreased strength;Decreased range of motion;Decreased activity tolerance;Decreased balance;Decreased mobility;Decreased knowledge of use of DME;Pain;Obesity       PT Treatment Interventions DME instruction;Gait training;Stair training;Functional mobility training;Therapeutic activities;Therapeutic exercise;Patient/family education    PT Goals (Current goals can be found in the Care Plan section)  Acute Rehab PT  Goals Patient Stated Goal: Get back to golfing PT Goal Formulation: With patient Time For Goal Achievement: 12/10/19 Potential to Achieve Goals: Good    Frequency 7X/week   Barriers to discharge        Co-evaluation               AM-PAC PT "6 Clicks" Mobility  Outcome Measure Help needed turning from your back to your side while in a flat bed without using bedrails?: A Little Help needed moving from lying on your back to sitting on the side of a flat bed without using bedrails?: A Little Help needed moving to and from a bed to a chair (including a wheelchair)?: A Little Help needed standing up from a chair using your arms (e.g., wheelchair or bedside chair)?: A Little Help needed to walk in hospital room?: A Little Help needed climbing 3-5 steps with a railing? : A Lot 6 Click Score: 17    End of Session Equipment Utilized During Treatment: Gait belt Activity Tolerance: Patient tolerated treatment well Patient left: Other (comment)(BSC) Nurse Communication: Mobility status;Other (comment)(BP 123/72) PT Visit Diagnosis: Difficulty in walking, not elsewhere classified (R26.2)    Time: [id number] PT Time Calculation (min) (ACUTE ONLY): 17 min   Charges:   PT Evaluation $PT Eval Low Complexity: 1 Low          [place] Acute Rehabilitation Services Pager [phone] Office [phone]   Tony Cruz 12/03/2019, 4:59 PM

## 2019-12-03 NOTE — Transfer of Care (Signed)
Immediate Anesthesia Transfer of Care Note  Patient: Tony Cruz  Procedure(s) Performed: Procedure(s) with comments: TOTAL KNEE ARTHROPLASTY (Right) - adductor canal  Patient Location: PACU  Anesthesia Type:General  Level of Consciousness: Alert, Awake, Oriented  Airway & Oxygen Therapy: Patient Spontanous Breathing  Post-op Assessment: Report given to RN  Post vital signs: Reviewed and stable  Last Vitals:  Vitals:   12/03/19 0630  BP: 113/66  Pulse: 71  Resp: 18  Temp: 36.9 C  SpO2: [id number]    Complications: No apparent anesthesia complications

## 2019-12-03 NOTE — Progress Notes (Signed)
Patient transferred to department. PACU RN to monitor pt until 1455 at a 1-1 ratio to ensure patient stable with interventions provided to maintain appropriate BP. Department RN to take over care for patient at that time.

## 2019-12-03 NOTE — Interval H&P Note (Signed)
History and Physical Interval Note:  12/03/2019 7:40 AM  Tony Cruz  has presented today for surgery, with the diagnosis of Right knee osteoarthritis.  The various methods of treatment have been discussed with the patient and family. After consideration of risks, benefits and other options for treatment, the patient has consented to  Procedure(s) with comments: TOTAL KNEE ARTHROPLASTY (Right) - adductor canal as a surgical intervention.  The patient's history has been reviewed, patient examined, no change in status, stable for surgery.  I have reviewed the patient's chart and labs.  Questions were answered to the patient's satisfaction.     [name] ANDREW

## 2019-12-03 NOTE — Anesthesia Procedure Notes (Signed)
Procedure Name: Intubation Performed by: Gerald Leitz, CRNA Pre-anesthesia Checklist: Patient identified, Patient being monitored, Timeout performed, Emergency Drugs available and Suction available Patient Re-evaluated:Patient Re-evaluated prior to induction Oxygen Delivery Method: Circle system utilized Preoxygenation: Pre-oxygenation with 100% oxygen Induction Type: IV induction Ventilation: Mask ventilation without difficulty Laryngoscope Size: Mac and 3 Grade View: Grade I Tube type: Oral Tube size: 7.5 mm Number of attempts: 1 Placement Confirmation: ETT inserted through vocal cords under direct vision,  positive ETCO2 and breath sounds checked- equal and bilateral Secured at: 21 cm Tube secured with: Tape Dental Injury: Teeth and Oropharynx as per pre-operative assessment

## 2019-12-03 NOTE — Anesthesia Postprocedure Evaluation (Signed)
Anesthesia Post Note  Patient: Tony Cruz  Procedure(s) Performed: TOTAL KNEE ARTHROPLASTY (Right Knee)     Patient location during evaluation: PACU Anesthesia Type: General Level of consciousness: sedated and patient cooperative Pain management: pain level controlled Vital Signs Assessment: post-procedure vital signs reviewed and stable Respiratory status: spontaneous breathing Cardiovascular status: stable Anesthetic complications: no    Last Vitals:  Vitals:   12/03/19 1330 12/03/19 1346  BP: 95/63 95/60  Pulse:  81  Resp:  14  Temp:    SpO2:  95%    Last Pain:  Vitals:   12/03/19 1300  TempSrc:   PainSc: 0-No pain                 Nolon Nations

## 2019-12-03 NOTE — Progress Notes (Signed)
Pt transferred to floor, AAd and nursing supervisor at bedside.  VSS BP 96/60  HR 71 Rr 14 sats 96per cent on 2 l.  PACU RN to remain a bedside.

## 2019-12-04 DIAGNOSIS — M1711 Unilateral primary osteoarthritis, right knee: Secondary | ICD-10-CM | POA: Diagnosis not present

## 2019-12-04 NOTE — Progress Notes (Signed)
   Subjective:  Patient reports pain as mild to moderate.  Overall doing well.  No acute events overnight.  Hoping to dc home to HHPT  Objective:   VITALS:   Vitals:   12/03/19 2131 12/04/19 0112 12/04/19 0524 12/04/19 0945  BP: 124/71 128/67 117/73 115/66  Pulse: 87 80 73 72  Resp: 16 16 16 18   Temp: 97.7 F (36.5 C) (!) 97.5 F (36.4 C) 97.8 F (36.6 C) 97.8 F (36.6 C)  TempSrc: Oral Oral Oral Oral  SpO2: 99% 100% 99% 97%  Weight:      Height:        Neurologically intact Neurovascular intact Sensation intact distally Intact pulses distally Dorsiflexion/Plantar flexion intact Incision: dressing C/D/I and HVAC pulled, with some bloody drainage from drain site Compartment soft   Lab Results  Component Value Date   WBC 8.4 11/26/2019   HGB 14.5 11/26/2019   HCT 43.5 11/26/2019   MCV 98.6 11/26/2019   PLT 258 11/26/2019   BMET    Component Value Date/Time   NA 133 (L) 11/26/2019 1441   NA 132 (L) 09/14/2019 0922   K 3.8 11/26/2019 1441   CL 96 (L) 11/26/2019 1441   CO2 27 11/26/2019 1441   GLUCOSE 89 11/26/2019 1441   BUN 17 11/26/2019 1441   BUN 15 09/14/2019 0922   CREATININE 0.87 11/26/2019 1441   CREATININE 0.93 05/20/2016 1041   CALCIUM 9.0 11/26/2019 1441   GFRNONAA >60 11/26/2019 1441   GFRAA >60 11/26/2019 1441     Assessment/Plan: 1 Day Post-Op   Active Problems:   Osteoarthritis of right knee   Advance diet Up with therapy  - resume Xarelto for DVT ppx for hx of DVT.  SCDs while in bed  - change HVAC drsg prn  - will follow along with progress with PT.  Looks like he will need HHPT and possibly another night here.   Nicholes Stairs 12/04/2019, 10:07 AM   Geralynn Rile, MD [phone]

## 2019-12-04 NOTE — Plan of Care (Signed)

## 2019-12-04 NOTE — Progress Notes (Signed)
Physical Therapy Treatment Patient Details Name: Tony Cruz MRN: [id number] DOB: May 13, 1934 Today's Date: 12/04/2019    History of Present Illness Pt s/p R TKR and with hx of pacemaker and peripheral neuropathy    PT Comments    Pt very motivated and progressing well with mobility.  Pt hopeful for dc home this date.   Follow Up Recommendations  Home health PT;Follow surgeon's recommendation for DC plan and follow-up therapies     Equipment Recommendations  Rolling walker with 5" wheels;3in1 (PT)    Recommendations for Other Services       Precautions / Restrictions Precautions Precautions: Fall;Knee Restrictions Weight Bearing Restrictions: No Other Position/Activity Restrictions: WBAT    Mobility  Bed Mobility               General bed mobility comments: Pt up to chair with nursing and requests back to same  Transfers Overall transfer level: Needs assistance Equipment used: Rolling walker (2 wheeled) Transfers: Sit to/from Stand Sit to Stand: Min guard         General transfer comment: cues for LE management and use of UEs to self assist;   Ambulation/Gait Ambulation/Gait assistance: Min assist;Min guard Gait Distance (Feet): 180 Feet Assistive device: Rolling walker (2 wheeled) Gait Pattern/deviations: Step-to pattern;Decreased step length - right;Decreased step length - left;Shuffle;Trunk flexed Gait velocity: decr   General Gait Details: cues for sequence, posture and position from [employer]             Wheelchair Mobility    Modified Rankin (Stroke Patients Only)       Balance Overall balance assessment: Needs assistance Sitting-balance support: Feet supported;No upper extremity supported Sitting balance-Leahy Scale: Good     Standing balance support: Bilateral upper extremity supported Standing balance-Leahy Scale: Poor                              Cognition Arousal/Alertness: Awake/alert Behavior During  Therapy: WFL for tasks assessed/performed;Impulsive Overall Cognitive Status: Within Functional Limits for tasks assessed                                        Exercises Total Joint Exercises Ankle Circles/Pumps: AROM;Both;15 reps;Supine Quad Sets: AROM;Both;10 reps;Supine Heel Slides: AAROM;Right;15 reps;Supine Straight Leg Raises: AAROM;AROM;Right;15 reps;Supine    General Comments        Pertinent Vitals/Pain Pain Assessment: 0-10 Pain Score: 3  Pain Location: R knee Pain Descriptors / Indicators: Aching;Sore Pain Intervention(s): Limited activity within patient's tolerance;Monitored during session;Premedicated before session;Ice applied    Home Living                      Prior Function            PT Goals (current goals can now be found in the care plan section) Acute Rehab PT Goals Patient Stated Goal: Get back to golfing PT Goal Formulation: With patient Time For Goal Achievement: 12/10/19 Potential to Achieve Goals: Good Progress towards PT goals: Progressing toward goals    Frequency    7X/week      PT Plan Current plan remains appropriate    Co-evaluation              AM-PAC PT "6 Clicks" Mobility   Outcome Measure  Help needed turning from your back to your side while  in a flat bed without using bedrails?: A Little Help needed moving from lying on your back to sitting on the side of a flat bed without using bedrails?: A Little Help needed moving to and from a bed to a chair (including a wheelchair)?: A Little Help needed standing up from a chair using your arms (e.g., wheelchair or bedside chair)?: A Little Help needed to walk in hospital room?: A Little Help needed climbing 3-5 steps with a railing? : A Lot 6 Click Score: 17    End of Session Equipment Utilized During Treatment: Gait belt Activity Tolerance: Patient tolerated treatment well Patient left: in chair;with call bell/phone within reach;with  family/visitor present;with chair alarm set Nurse Communication: Mobility status PT Visit Diagnosis: Difficulty in walking, not elsewhere classified (R26.2)     Time: [id number] PT Time Calculation (min) (ACUTE ONLY): 27 min  Charges:  $Gait Training: 8-22 mins $Therapeutic Exercise: 8-22 mins                     [place] Pager [phone] Office [phone]    Kacyn Souder 12/04/2019, 1:42 PM

## 2019-12-04 NOTE — TOC Progression Note (Signed)
Transition of Care [facility]) - Progression Note    Patient Details  Name: Tony Cruz MRN: [id number] Date of Birth: 09-12-1933  Transition of [facility]) CM/SW Contact  Joaquin Courts, RN Phone Number: 12/04/2019, 11:59 AM  Clinical Narrative:  CM spoke with patient at bedside. Patient set up with [facility] [place] [employer] to deliver rolling walker to bedside for home use.      Expected Discharge Plan: [place] Barriers to Discharge: No Barriers Identified  Expected Discharge Plan and Services Expected Discharge Plan: [place]   Discharge Planning Services: CM Consult Post Acute Care Choice: [place] arrangements for the past 2 months: Single Family Home Expected Discharge Date: 12/04/19               DME Arranged: Gilford Rile rolling, 3-N-1 DME Agency: [employer] Date DME Agency Contacted: 12/04/19 Time DME Agency Contacted: [phone] Representative spoke with at DME Agency: [place]: PT [place]: [place] Date [place]: 12/04/19 Time [place]: [place] Representative spoke with at [place]: [place] Determinants of Health ([place]) Interventions    Readmission Risk Interventions No flowsheet data found.

## 2019-12-04 NOTE — Progress Notes (Signed)
Attempted to Call Dr. Stann Mainland to notify that patient is requesting to stay another night. No answer obtained.

## 2019-12-04 NOTE — Progress Notes (Signed)
Physical Therapy Treatment Patient Details Name: Tony Cruz MRN: [id number] DOB: [birth year] Today's Date: 12/04/2019    History of Present Illness Pt s/p R TKR and with hx of pacemaker and peripheral neuropathy    PT Comments    Pt continues very motivated and progressing with mobility including negotiating stairs with assist this pm.   Follow Up Recommendations  Home health PT;Follow surgeon's recommendation for DC plan and follow-up therapies     Equipment Recommendations  Rolling walker with 5" wheels;3in1 (PT)    Recommendations for Other Services       Precautions / Restrictions Precautions Precautions: Fall;Knee Restrictions Weight Bearing Restrictions: No Other Position/Activity Restrictions: WBAT    Mobility  Bed Mobility               General bed mobility comments: Pt up in chair and requests back to same  Transfers Overall transfer level: Needs assistance Equipment used: Rolling walker (2 wheeled) Transfers: Sit to/from Stand Sit to Stand: Min guard         General transfer comment: cues for LE management and use of UEs to self assist;   Ambulation/Gait Ambulation/Gait assistance: Min assist;Min guard Gait Distance (Feet): 150 Feet Assistive device: Rolling walker (2 wheeled) Gait Pattern/deviations: Step-to pattern;Decreased step length - right;Decreased step length - left;Shuffle;Trunk flexed Gait velocity: decr   General Gait Details: cues for sequence, posture and position from RW: pt requests use of shoes but pt more unsteady with same - shoes appear to be too large   Stairs Stairs: Yes Stairs assistance: Min assist Stair Management: One rail Right;Step to pattern;Forwards;With cane Number of Stairs: 5 General stair comments: cues for sequence and foot/cane placement.  min/mod assist required for balance   Wheelchair Mobility    Modified Rankin (Stroke Patients Only)       Balance Overall balance assessment: Needs  assistance Sitting-balance support: Feet supported;No upper extremity supported Sitting balance-Leahy Scale: Good     Standing balance support: Bilateral upper extremity supported Standing balance-Leahy Scale: Poor                              Cognition Arousal/Alertness: Awake/alert Behavior During Therapy: WFL for tasks assessed/performed;Impulsive Overall Cognitive Status: Within Functional Limits for tasks assessed                                        Exercises Total Joint Exercises Ankle Circles/Pumps: AROM;Both;15 reps;Supine Quad Sets: AROM;Both;10 reps;Supine Heel Slides: AAROM;Right;15 reps;Supine Straight Leg Raises: AAROM;AROM;Right;15 reps;Supine    General Comments        Pertinent Vitals/Pain Pain Assessment: 0-10 Pain Score: 5  Pain Location: R knee Pain Descriptors / Indicators: Aching;Sore Pain Intervention(s): Limited activity within patient's tolerance;Monitored during session;Premedicated before session;Ice applied    Home Living                      Prior Function            PT Goals (current goals can now be found in the care plan section) Acute Rehab PT Goals Patient Stated Goal: Get back to golfing PT Goal Formulation: With patient Time For Goal Achievement: 12/10/19 Potential to Achieve Goals: Good Progress towards PT goals: Progressing toward goals    Frequency    7X/week      PT Plan Current plan remains  appropriate    Co-evaluation              AM-PAC PT "6 Clicks" Mobility   Outcome Measure  Help needed turning from your back to your side while in a flat bed without using bedrails?: A Little Help needed moving from lying on your back to sitting on the side of a flat bed without using bedrails?: A Little Help needed moving to and from a bed to a chair (including a wheelchair)?: A Little Help needed standing up from a chair using your arms (e.g., wheelchair or bedside chair)?: A  Little Help needed to walk in hospital room?: A Little Help needed climbing 3-5 steps with a railing? : A Lot 6 Click Score: 17    End of Session Equipment Utilized During Treatment: Gait belt Activity Tolerance: Patient tolerated treatment well Patient left: in chair;with call bell/phone within reach;with family/visitor present;with chair alarm set Nurse Communication: Mobility status PT Visit Diagnosis: Difficulty in walking, not elsewhere classified (R26.2)     Time: 1400-1435 PT Time Calculation (min) (ACUTE ONLY): 35 min  Charges:  $Gait Training: 8-22 mins $Therapeutic Exercise: 8-22 mins                     Debe Coder PT Acute Rehabilitation Services Pager [phone] Office [phone]    Chella Chapdelaine 12/04/2019, 5:40 PM

## 2019-12-05 DIAGNOSIS — M1711 Unilateral primary osteoarthritis, right knee: Secondary | ICD-10-CM | POA: Diagnosis not present

## 2019-12-05 NOTE — Progress Notes (Signed)
Physical Therapy Treatment Patient Details Name: Tony Cruz MRN: [id number] DOB: 01-29-1934 Today's Date: 12/05/2019    History of Present Illness Pt s/p R TKR and with hx of pacemaker and peripheral neuropathy    PT Comments    Pt progressing steadily with mobility including improved performance on stairs this am.  But activity tolerance ltd by increased pain and fatigue.   Follow Up Recommendations  Home health PT;Follow surgeon's recommendation for DC plan and follow-up therapies     Equipment Recommendations  Rolling walker with 5" wheels;3in1 (PT)    Recommendations for Other Services       Precautions / Restrictions Precautions Precautions: Fall;Knee Restrictions Weight Bearing Restrictions: No Other Position/Activity Restrictions: WBAT    Mobility  Bed Mobility               General bed mobility comments: Reviewed verbally and with demonstration - pt declines to attempt 2* fatigue  Transfers Overall transfer level: Needs assistance Equipment used: Rolling walker (2 wheeled) Transfers: Sit to/from Stand Sit to Stand: Min guard;Supervision         General transfer comment: cues for LE management and use of UEs to self assist;   Ambulation/Gait Ambulation/Gait assistance: Min guard Gait Distance (Feet): 65 Feet Assistive device: Rolling walker (2 wheeled) Gait Pattern/deviations: Step-to pattern;Decreased step length - right;Decreased step length - left;Shuffle;Trunk flexed Gait velocity: decr   General Gait Details: cues for posture and position from RW   Stairs Stairs: Yes Stairs assistance: Min assist Stair Management: One rail Right;Step to pattern;Forwards;With crutches Number of Stairs: 5 General stair comments: cues for sequence and foot/crutch placement.     Wheelchair Mobility    Modified Rankin (Stroke Patients Only)       Balance Overall balance assessment: Needs assistance Sitting-balance support: Feet supported;No  upper extremity supported Sitting balance-Leahy Scale: Good     Standing balance support: Bilateral upper extremity supported Standing balance-Leahy Scale: Fair                              Cognition Arousal/Alertness: Awake/alert Behavior During Therapy: WFL for tasks assessed/performed;Impulsive Overall Cognitive Status: Within Functional Limits for tasks assessed                                        Exercises Total Joint Exercises Ankle Circles/Pumps: AROM;Both;15 reps;Supine Quad Sets: AROM;Both;10 reps;Supine Heel Slides: AAROM;Right;Supine;20 reps Hip ABduction/ADduction: AAROM;Right;20 reps;Supine Straight Leg Raises: AAROM;Right;Supine;20 reps    General Comments        Pertinent Vitals/Pain Pain Assessment: 0-10 Pain Score: 4  Pain Location: R knee Pain Descriptors / Indicators: Aching;Sore Pain Intervention(s): Limited activity within patient's tolerance;Monitored during session;Premedicated before session;Ice applied    Home Living                      Prior Function            PT Goals (current goals can now be found in the care plan section) Acute Rehab PT Goals Patient Stated Goal: Get back to golfing PT Goal Formulation: With patient Time For Goal Achievement: 12/10/19 Potential to Achieve Goals: Good Progress towards PT goals: Progressing toward goals    Frequency    7X/week      PT Plan Current plan remains appropriate    Co-evaluation  AM-PAC PT "6 Clicks" Mobility   Outcome Measure  Help needed turning from your back to your side while in a flat bed without using bedrails?: A Little Help needed moving from lying on your back to sitting on the side of a flat bed without using bedrails?: A Little Help needed moving to and from a bed to a chair (including a wheelchair)?: A Little Help needed standing up from a chair using your arms (e.g., wheelchair or bedside chair)?: A  Little Help needed to walk in hospital room?: A Little Help needed climbing 3-5 steps with a railing? : A Little 6 Click Score: 18    End of Session Equipment Utilized During Treatment: Gait belt Activity Tolerance: Patient tolerated treatment well Patient left: in chair;with call bell/phone within reach;with family/visitor present;with chair alarm set Nurse Communication: Mobility status PT Visit Diagnosis: Difficulty in walking, not elsewhere classified (R26.2)     Time: 0925-1000 PT Time Calculation (min) (ACUTE ONLY): 35 min  Charges:  $Gait Training: 8-22 mins $Therapeutic Exercise: 8-22 mins $Therapeutic Activity: 8-22 mins                     Debe Coder PT Acute Rehabilitation Services Pager [phone] Office [phone]    Ladonne Sharples 12/05/2019, 12:31 PM

## 2019-12-05 NOTE — Progress Notes (Signed)
Physical Therapy Treatment Patient Details Name: Tony Cruz MRN: [id number] DOB: November 02, 1933 Today's Date: 12/05/2019    History of Present Illness Pt s/p R TKR and with hx of pacemaker and peripheral neuropathy    PT Comments    Pt performed HEP with assist - spouse present and written instruction provided and reviewed.   Follow Up Recommendations  Home health PT;Follow surgeon's recommendation for DC plan and follow-up therapies     Equipment Recommendations  Rolling walker with 5" wheels;3in1 (PT)    Recommendations for Other Services       Precautions / Restrictions Precautions Precautions: Fall;Knee Restrictions Weight Bearing Restrictions: No Other Position/Activity Restrictions: WBAT    Mobility  Bed Mobility                  Transfers                    Ambulation/Gait                 Stairs             Wheelchair Mobility    Modified Rankin (Stroke Patients Only)       Balance                                            Cognition Arousal/Alertness: Awake/alert Behavior During Therapy: WFL for tasks assessed/performed;Impulsive Overall Cognitive Status: Within Functional Limits for tasks assessed                                        Exercises Total Joint Exercises Ankle Circles/Pumps: AROM;Both;15 reps;Supine Quad Sets: AROM;Both;10 reps;Supine Heel Slides: AAROM;Right;Supine;20 reps Hip ABduction/ADduction: AAROM;Right;20 reps;Supine Straight Leg Raises: AAROM;Right;Supine;20 reps    General Comments        Pertinent Vitals/Pain Pain Assessment: 0-10 Pain Score: 7  Pain Location: R knee Pain Descriptors / Indicators: Aching;Sore Pain Intervention(s): Limited activity within patient's tolerance;Monitored during session;Premedicated before session;Ice applied    Home Living                      Prior Function            PT Goals (current goals can now  be found in the care plan section) Acute Rehab PT Goals Patient Stated Goal: Get back to golfing PT Goal Formulation: With patient Time For Goal Achievement: 12/10/19 Potential to Achieve Goals: Good Progress towards PT goals: Progressing toward goals    Frequency    7X/week      PT Plan Current plan remains appropriate    Co-evaluation              AM-PAC PT "6 Clicks" Mobility   Outcome Measure  Help needed turning from your back to your side while in a flat bed without using bedrails?: A Little Help needed moving from lying on your back to sitting on the side of a flat bed without using bedrails?: A Little Help needed moving to and from a bed to a chair (including a wheelchair)?: A Little Help needed standing up from a chair using your arms (e.g., wheelchair or bedside chair)?: A Little Help needed to walk in hospital room?: A Little Help needed climbing 3-5 steps with a railing? : A Lot  6 Click Score: 17    End of Session   Activity Tolerance: Patient tolerated treatment well Patient left: in chair;with call bell/phone within reach;with family/visitor present;with chair alarm set Nurse Communication: Mobility status PT Visit Diagnosis: Difficulty in walking, not elsewhere classified (R26.2)     Time: [id number] PT Time Calculation (min) (ACUTE ONLY): 21 min  Charges:  $Therapeutic Exercise: 8-22 mins                     Debe Coder PT Acute Rehabilitation Services Pager [phone] Office [phone]    Ladean Steinmeyer 12/05/2019, 12:25 PM

## 2019-12-05 NOTE — Progress Notes (Signed)
   Subjective: 2 Days Post-Op Procedure(s) (LRB): TOTAL KNEE ARTHROPLASTY (Right) Patient reports pain as moderate.   Plan is to go Home after hospital stay.  Objective: Vital signs in last 24 hours: Temp:  [97.3 F (36.3 C)-98.2 F (36.8 C)] 97.3 F (36.3 C) (05/09 0542) Pulse Rate:  [72-80] 79 (05/09 0542) Resp:  [16-18] 16 (05/09 0542) BP: (97-124)/(62-77) 110/77 (05/09 0542) SpO2:  [97 %-99 %] 98 % (05/09 0542)  Intake/Output from previous day:  Intake/Output Summary (Last 24 hours) at 12/05/2019 0746 Last data filed at 12/05/2019 0300 Gross per 24 hour  Intake 870 ml  Output 2230 ml  Net -1360 ml    Intake/Output this shift: No intake/output data recorded.  Labs: No results for input(s): HGB in the last 72 hours. No results for input(s): WBC, RBC, HCT, PLT in the last 72 hours. No results for input(s): NA, K, CL, CO2, BUN, CREATININE, GLUCOSE, CALCIUM in the last 72 hours. Recent Labs    12/03/19 0613  INR 1.1    EXAM General - Patient is Alert, Appropriate and Oriented Extremity - Neurologically intact Neurovascular intact No cellulitis present Compartment soft Dressing/Incision - clean, dry, no drainage Motor Function - intact, moving foot and toes well on exam.   Past Medical History:  Diagnosis Date  . Allergy   . Anal fissure   . Anemia   . Backache, unspecified   . Barrett's esophagus   . Diverticulosis of colon (without mention of hemorrhage)   . DVT of leg (deep venous thrombosis) ([place])   . Esophageal reflux   . Factor V Leiden deficiency ([place])   . hypertension   . Hypertrophy of prostate with urinary obstruction and other lower urinary tract symptoms (LUTS)   . Irritable bowel syndrome   . Obstructive sleep apnea   . Osteoarthrosis, unspecified whether generalized or localized, unspecified site   . Other abnormal glucose   . Overweight(278.02)   . Pacemaker-MDT    Change out 2008  . Sleep apnea 2016   Uses cpap  . Syncope and collapse     . Tubular adenoma of colon   . Unspecified hereditary and idiopathic peripheral neuropathy     Assessment/Plan: 2 Days Post-Op Procedure(s) (LRB): TOTAL KNEE ARTHROPLASTY (Right) Active Problems:   Osteoarthritis of right knee   Up with therapy Discharge home with home health  Discharge after PT today  DVT Prophylaxis - Xarelto Weight-Bearing as tolerated to right leg  Gaynelle Arabian 12/05/2019, 7:46 AM

## 2019-12-06 ENCOUNTER — Encounter: Payer: Self-pay | Admitting: *Deleted

## 2019-12-07 DIAGNOSIS — D509 Iron deficiency anemia, unspecified: Secondary | ICD-10-CM | POA: Diagnosis not present

## 2019-12-07 DIAGNOSIS — N138 Other obstructive and reflux uropathy: Secondary | ICD-10-CM | POA: Diagnosis not present

## 2019-12-07 DIAGNOSIS — Z471 Aftercare following joint replacement surgery: Secondary | ICD-10-CM | POA: Diagnosis not present

## 2019-12-07 DIAGNOSIS — N401 Enlarged prostate with lower urinary tract symptoms: Secondary | ICD-10-CM | POA: Diagnosis not present

## 2019-12-07 DIAGNOSIS — K589 Irritable bowel syndrome without diarrhea: Secondary | ICD-10-CM | POA: Diagnosis not present

## 2019-12-07 DIAGNOSIS — G609 Hereditary and idiopathic neuropathy, unspecified: Secondary | ICD-10-CM | POA: Diagnosis not present

## 2019-12-07 DIAGNOSIS — M150 Primary generalized (osteo)arthritis: Secondary | ICD-10-CM | POA: Diagnosis not present

## 2019-12-07 DIAGNOSIS — I10 Essential (primary) hypertension: Secondary | ICD-10-CM | POA: Diagnosis not present

## 2019-12-07 DIAGNOSIS — K219 Gastro-esophageal reflux disease without esophagitis: Secondary | ICD-10-CM | POA: Diagnosis not present

## 2019-12-07 DIAGNOSIS — K5730 Diverticulosis of large intestine without perforation or abscess without bleeding: Secondary | ICD-10-CM | POA: Diagnosis not present

## 2019-12-08 NOTE — Discharge Summary (Signed)
Physician Discharge Summary  Patient ID: Tony Cruz MRN: [id number] DOB/AGE: Aug 18, 1933 86 y.o.  Admit date: 12/03/2019 Discharge date: 12/08/2019  Admission Diagnoses: Right knee osteoarthritis  Discharge Diagnoses:  Active Problems:   Osteoarthritis of right knee   Discharged Condition: stable  Hospital Course: patient was admitted on May 7 for right knee end-stage osteoarthritis for a right total knee arthroplasty. Patient tolerated surgery well.  He was sent to the PACU in stable condition and then up to the post op floor in stable condition. Patient was seen by PT this evening and tolertated the session. No events over night. PO day 1 up with physical therapy. Pain well controled with po pain meds. No events over night. PO day 2 doing well. UP with PT, discharged home with HHPT. Patient also went home with pain meds, muscle relaxer's and nausea medications.   Consults: None  Significant Diagnostic Studies: none  Treatments: IV hydration, antibiotics: Ancef, analgesia: acetaminophen and oxycodone, anticoagulation: Xarelto, therapies: PT and surgery: Right TKA  Discharge Exam: Blood pressure 110/77, pulse 79, temperature (!) 97.3 F (36.3 C), temperature source Oral, resp. rate 16, height 5' 7.5" (1.715 m), weight 98.1 kg, SpO2 98 %. General appearance: alert, cooperative, appears stated age and no distress Extremities: extremities normal, atraumatic, no cyanosis or edema and Homans sign is negative, no sign of DVT Pulses: 2+ and symmetric Skin: Skin color, texture, turgor normal. No rashes or lesions Neurologic: Grossly normal Incision/Wound: Dressings clean, dry and intact  Disposition: Discharge disposition: 01-Home or Self Care       Discharge Instructions    Call MD / Call 911   Complete by: As directed    If you experience chest pain or shortness of breath, CALL 911 and be transported to the hospital emergency room.  If you develope a fever above 101 F,  pus (white drainage) or increased drainage or redness at the wound, or calf pain, call your surgeon's office.   Constipation Prevention   Complete by: As directed    Drink plenty of fluids.  Prune juice may be helpful.  You may use a stool softener, such as Colace (over the counter) 100 mg twice a day.  Use MiraLax (over the counter) for constipation as needed.   DME Bedside commode   Complete by: As directed    Patient needs a bedside commode to treat with the following condition: S/P total knee replacement using cement, right   Diet - low sodium heart healthy   Complete by: As directed    Discharge instructions   Complete by: As directed    Dr. Sydnee Cabal [facility] [address] [place], [place] [address] [phone]  TOTAL KNEE REPLACEMENT POSTOPERATIVE DIRECTIONS  Knee Rehabilitation, Guidelines Following Surgery  Results after knee surgery are often greatly improved when you follow the exercise, range of motion and muscle strengthening exercises prescribed by your doctor. Safety measures are also important to protect the knee from further injury. Any time any of these exercises cause you to have increased pain or swelling in your knee joint, decrease the amount until you are comfortable again and slowly increase them. If you have problems or questions, call your caregiver or physical therapist for advice.   HOME CARE INSTRUCTIONS  Remove items at home which could result in a fall. This includes throw rugs or furniture in walking pathways.  ICE to the affected knee every three hours for 30 minutes at a time and then as needed for pain  and swelling.  Continue to use ice on the knee for pain and swelling from surgery. You may notice swelling that will progress down to the foot and ankle.  This is normal after surgery.  Elevate the leg when you are not up walking on it.   Continue to use the breathing machine which will help keep your temperature down.  It is common for  your temperature to cycle up and down following surgery, especially at night when you are not up moving around and exerting yourself.  The breathing machine keeps your lungs expanded and your temperature down. Do not place pillow under knee, focus on keeping the knee straight while resting   DIET You may resume your previous home diet once your are discharged from the hospital.  DRESSING / WOUND CARE / SHOWERING Keep the surgical dressing until follow up.  The dressing is water proof, but you need to put extra covering over it like plastic wrap.  IF THE DRESSING FALLS OFF or the wound gets wet inside, change the dressing with sterile gauze.  Please use good hand washing techniques before changing the dressing.  Do not use any lotions or creams on the incision until instructed by your surgeon.   You may start showering once you are discharged home but do not submerge the incision under water.  You are sent home with an ACE bandage on over the leg, this can be removed at 3 days from surgery. At this time you can start showering. Please place the white TED stocking on the surgical leg after. This needs to be worn on the surgical leg for 2 weeks after surgery.   ACTIVITY Walk with your walker as instructed. Use walker as long as suggested by your caregivers. Avoid periods of inactivity such as sitting longer than an hour when not asleep. This helps prevent blood clots.  You may resume a sexual relationship in one month or when given the OK by your doctor.  You may return to work once you are cleared by your doctor.  Do not drive a car for 6 weeks or until released by you surgeon.  Do not drive while taking narcotics.  WEIGHT BEARING Weight bearing as tolerated with assist device (walker, cane, etc) as directed, use it as long as suggested by your surgeon or therapist, typically at least 4-6 weeks.  POSTOPERATIVE CONSTIPATION PROTOCOL Constipation - defined medically as fewer than three stools per  week and severe constipation as less than one stool per week.  One of the most common issues patients have following surgery is constipation.  Even if you have a regular bowel pattern at home, your normal regimen is likely to be disrupted due to multiple reasons following surgery.  Combination of anesthesia, postoperative narcotics, change in appetite and fluid intake all can affect your bowels.  In order to avoid complications following surgery, here are some recommendations in order to help you during your recovery period.  Colace (docusate) - Pick up an over-the-counter form of Colace or another stool softener and take twice a day as long as you are requiring postoperative pain medications.  Take with a full glass of water daily.  If you experience loose stools or diarrhea, hold the colace until you stool forms back up.  If your symptoms do not get better within 1 week or if they get worse, check with your doctor.  Dulcolax (bisacodyl) - Pick up over-the-counter and take as directed by the product packaging as needed to assist  with the movement of your bowels.  Take with a full glass of water.  Use this product as needed if not relieved by Colace only.   MiraLax (polyethylene glycol) - Pick up over-the-counter to have on hand.  MiraLax is a solution that will increase the amount of water in your bowels to assist with bowel movements.  Take as directed and can mix with a glass of water, juice, soda, coffee, or tea.  Take if you go more than two days without a movement. Do not use MiraLax more than once per day. Call your doctor if you are still constipated or irregular after using this medication for 7 days in a row.  If you continue to have problems with postoperative constipation, please contact the office for further assistance and recommendations.  If you experience "the worst abdominal pain ever" or develop nausea or vomiting, please contact the office immediatly for further recommendations for  treatment.  ITCHING  If you experience itching with your medications, try taking only a single pain pill, or even half a pain pill at a time.  You can also use Benadryl over the counter for itching or also to help with sleep.   TED HOSE STOCKINGS Wear the elastic stockings on both legs for two weeks following surgery during the day but you may remove then at night for sleeping.  Okay to remove ACE in 3 days, put TED on after  MEDICATIONS See your medication summary on the "After Visit Summary" that the nursing staff will review with you prior to discharge.  You may have some home medications which will be placed on hold until you complete the course of blood thinner medication.  It is important for you to complete the blood thinner medication as prescribed by your surgeon.  Continue your approved medications as instructed at time of discharge. If you were not previously taking any blood thinners prior to surgery please start taking Aspirin 325 mg tabs twice daily for 6 weeks. If you are unable to take Aspirin please let your doctor know.   PRECAUTIONS If you experience chest pain or shortness of breath - call 911 immediately for transfer to the hospital emergency department.  If you develop a fever greater that 101 F, purulent drainage from wound, increased redness or drainage from wound, foul odor from the wound/dressing, or calf pain - CONTACT YOUR SURGEON.                                                   FOLLOW-UP APPOINTMENTS Make sure you keep all of your appointments after your operation with your surgeon and caregivers. You should call the office at the above phone number and make an appointment for approximately two weeks after the date of your surgery or on the date instructed by your surgeon outlined in the "After Visit Summary".   RANGE OF MOTION AND STRENGTHENING EXERCISES  Rehabilitation of the knee is important following a knee injury or an operation. After just a few days of  immobilization, the muscles of the thigh which control the knee become weakened and shrink (atrophy). Knee exercises are designed to build up the tone and strength of the thigh muscles and to improve knee motion. Often times heat used for twenty to thirty minutes before working out will loosen up your tissues and help with improving the  range of motion but do not use heat for the first two weeks following surgery. These exercises can be done on a training (exercise) mat, on the floor, on a table or on a bed. Use what ever works the best and is most comfortable for you Knee exercises include:  Leg Lifts - While your knee is still immobilized in a splint or cast, you can do straight leg raises. Lift the leg to 60 degrees, hold for 3 sec, and slowly lower the leg. Repeat 10-20 times 2-3 times daily. Perform this exercise against resistance later as your knee gets better.  Quad and Hamstring Sets - Tighten up the muscle on the front of the thigh (Quad) and hold for 5-10 sec. Repeat this 10-20 times hourly. Hamstring sets are done by pushing the foot backward against an object and holding for 5-10 sec. Repeat as with quad sets.  Leg Slides: Lying on your back, slowly slide your foot toward your buttocks, bending your knee up off the floor (only go as far as is comfortable). Then slowly slide your foot back down until your leg is flat on the floor again. Angel Wings: Lying on your back spread your legs to the side as far apart as you can without causing discomfort.  A rehabilitation program following serious knee injuries can speed recovery and prevent re-injury in the future due to weakened muscles. Contact your doctor or a physical therapist for more information on knee rehabilitation.   IF YOU ARE TRANSFERRED TO A SKILLED REHAB FACILITY If the patient is transferred to a skilled rehab facility following release from the hospital, a list of the current medications will be sent to the facility for the patient to  continue.  When discharged from the skilled rehab facility, please have the facility set up the patient's [place] prior to being released. Also, the skilled facility will be responsible for providing the patient with their medications at time of release from the facility to include their pain medication, the muscle relaxants, and their blood thinner medication. If the patient is still at the rehab facility at time of the two week follow up appointment, the skilled rehab facility will also need to assist the patient in arranging follow up appointment in our office and any transportation needs.  MAKE SURE YOU:  Understand these instructions.  Get help right away if you are not doing well or get worse.    Pick up stool softner and laxative for home use following surgery while on pain medications. May shower starting three days after surgery. Please use a clean towel to pat the leg dry following showers. Continue to use ice for pain and swelling after surgery. Do not use any lotions or creams on the incision until instructed by you Start Aspirin immediately following surgery.   Do not put a pillow under the knee. Place it under the heel.   Complete by: As directed    Driving restrictions   Complete by: As directed    No driving for two weeks   Face-to-face encounter (required for Medicare/Medicaid patients)   Complete by: As directed    I Nicholes Stairs certify that this patient is under my care and that I, or a nurse practitioner or physician's assistant working with me, had a face-to-face encounter that meets the physician face-to-face encounter requirements with this patient on 12/04/2019. The encounter with the patient was in whole, or in part for the following medical condition(s) which is the primary  reason for home health care (List medical condition): s/p Right TKA   The encounter with the patient was in whole, or in part, for the following medical condition, which  is the primary reason for home health care: s/p Right TKA   I certify that, based on my findings, the following services are medically necessary home health services: Physical therapy   Reason for Medically Necessary Home Health Services: Therapy- Personnel officer, Public librarian   My clinical findings support the need for the above services: Pain interferes with ambulation/mobility   Further, I certify that my clinical findings support that this patient is homebound due to: Ambulates short distances less than 300 feet   For home use only DME 4 wheeled rolling walker with seat   Complete by: As directed    Patient needs a walker to treat with the following condition: S/P TKR (total knee replacement) using cement, right   Home Health   Complete by: As directed    To provide the following care/treatments: PT   TED hose   Complete by: As directed    Use stockings (TED hose) for three weeks on both leg(s).  You may remove them at night for sleeping.   Weight bearing as tolerated   Complete by: As directed      Allergies as of 12/05/2019      Reactions   Shellfish Allergy Rash   Sensitive to shellfish      Medication List    TAKE these medications   acetaminophen 500 MG tablet Commonly known as: TYLENOL Take 500-1,000 mg by mouth every 6 (six) hours as needed for headache (or pain).   ascorbic acid 500 MG tablet Commonly known as: VITAMIN C Take 500 mg by mouth daily.   calcium carbonate 600 MG Tabs tablet Commonly known as: OS-CAL Take 600 mg by mouth daily.   chlorthalidone 25 MG tablet Commonly known as: HYGROTON Take 1 tablet (25 mg total) by mouth daily.   ferrous sulfate 325 (65 FE) MG tablet Take 325 mg by mouth 2 (two) times daily with a meal.   finasteride 5 MG tablet Commonly known as: PROSCAR Take 1 tablet by mouth daily.   Fish Oil 1200 MG Caps Take 1,200 mg by mouth daily.   Flomax 0.4 MG Caps capsule Generic drug: tamsulosin Take 0.4 mg  by mouth daily.   Lecithin 1200 MG Caps Take 1 capsule by mouth daily.   loratadine 10 MG tablet Commonly known as: CLARITIN Take 10 mg by mouth daily.   losartan 100 MG tablet Commonly known as: COZAAR Take 1 tablet (100 mg total) by mouth daily.   methocarbamol 500 MG tablet Commonly known as: Robaxin Take 1 tablet (500 mg total) by mouth 4 (four) times daily.   multivitamin capsule Take 1 capsule by mouth daily.   naproxen 500 MG tablet Commonly known as: NAPROSYN TAKE 1 TABLET TWICE A DAY   ondansetron 4 MG tablet Commonly known as: Zofran Take 1 tablet (4 mg total) by mouth every 8 (eight) hours as needed for nausea or vomiting.   oxyCODONE 5 MG immediate release tablet Commonly known as: Roxicodone Take 1 tablet (5 mg total) by mouth every 4 (four) hours as needed for severe pain.   pantoprazole 40 MG tablet Commonly known as: PROTONIX TAKE ONE TABLET DAILY. MAKEAN APPOINTMENT FOR FURTHER REFILLS What changed: See the new instructions.   traMADol 50 MG tablet Commonly known as: ULTRAM Take 1 tablet (50 mg total)  by mouth 3 (three) times daily. What changed: when to take this   Xarelto 15 MG Tabs tablet Generic drug: Rivaroxaban TAKE 1 TABLET DAILY WITH   SUPPER What changed: See the new instructions.            Durable Medical Equipment  (From admission, onward)         Start     Ordered   12/04/19 0000  For home use only DME 4 wheeled rolling walker with seat    Question:  Patient needs a walker to treat with the following condition  Answer:  S/P TKR (total knee replacement) using cement, right   12/04/19 1010   12/04/19 0000  DME Bedside commode    Question:  Patient needs a bedside commode to treat with the following condition  Answer:  S/P total knee replacement using cement, right   12/04/19 1010           Discharge Care Instructions  (From admission, onward)         Start     Ordered   12/03/19 0000  Weight bearing as tolerated      12/03/19 1011         Follow-up Information    [facility] Follow up.   Specialty: [place] Why: agency will provide home health physical therapy Contact information: [place] [address] [phone]           Signed: Drue Novel, PA-C [facility] 12/08/2019, 12:51 PM

## 2019-12-10 DIAGNOSIS — I10 Essential (primary) hypertension: Secondary | ICD-10-CM | POA: Diagnosis not present

## 2019-12-10 DIAGNOSIS — D509 Iron deficiency anemia, unspecified: Secondary | ICD-10-CM | POA: Diagnosis not present

## 2019-12-10 DIAGNOSIS — K219 Gastro-esophageal reflux disease without esophagitis: Secondary | ICD-10-CM | POA: Diagnosis not present

## 2019-12-10 DIAGNOSIS — N401 Enlarged prostate with lower urinary tract symptoms: Secondary | ICD-10-CM | POA: Diagnosis not present

## 2019-12-10 DIAGNOSIS — G609 Hereditary and idiopathic neuropathy, unspecified: Secondary | ICD-10-CM | POA: Diagnosis not present

## 2019-12-10 DIAGNOSIS — K5730 Diverticulosis of large intestine without perforation or abscess without bleeding: Secondary | ICD-10-CM | POA: Diagnosis not present

## 2019-12-10 DIAGNOSIS — Z471 Aftercare following joint replacement surgery: Secondary | ICD-10-CM | POA: Diagnosis not present

## 2019-12-10 DIAGNOSIS — N138 Other obstructive and reflux uropathy: Secondary | ICD-10-CM | POA: Diagnosis not present

## 2019-12-10 DIAGNOSIS — M150 Primary generalized (osteo)arthritis: Secondary | ICD-10-CM | POA: Diagnosis not present

## 2019-12-10 DIAGNOSIS — K589 Irritable bowel syndrome without diarrhea: Secondary | ICD-10-CM | POA: Diagnosis not present

## 2019-12-13 ENCOUNTER — Other Ambulatory Visit (HOSPITAL_COMMUNITY): Payer: Self-pay | Admitting: Specialist

## 2019-12-13 ENCOUNTER — Ambulatory Visit (HOSPITAL_COMMUNITY)
Admission: RE | Admit: 2019-12-13 | Discharge: 2019-12-13 | Disposition: A | Discharge status: Home / Self Care | Payer: Medicare HMO | Source: Ambulatory Visit | Attending: Specialist | Admitting: Specialist

## 2019-12-13 ENCOUNTER — Other Ambulatory Visit: Payer: Self-pay

## 2019-12-13 DIAGNOSIS — K219 Gastro-esophageal reflux disease without esophagitis: Secondary | ICD-10-CM | POA: Diagnosis not present

## 2019-12-13 DIAGNOSIS — I10 Essential (primary) hypertension: Secondary | ICD-10-CM | POA: Diagnosis not present

## 2019-12-13 DIAGNOSIS — Z471 Aftercare following joint replacement surgery: Secondary | ICD-10-CM | POA: Diagnosis not present

## 2019-12-13 DIAGNOSIS — N138 Other obstructive and reflux uropathy: Secondary | ICD-10-CM | POA: Diagnosis not present

## 2019-12-13 DIAGNOSIS — M150 Primary generalized (osteo)arthritis: Secondary | ICD-10-CM | POA: Diagnosis not present

## 2019-12-13 DIAGNOSIS — M79604 Pain in right leg: Secondary | ICD-10-CM

## 2019-12-13 DIAGNOSIS — M7989 Other specified soft tissue disorders: Secondary | ICD-10-CM | POA: Insufficient documentation

## 2019-12-13 DIAGNOSIS — K589 Irritable bowel syndrome without diarrhea: Secondary | ICD-10-CM | POA: Diagnosis not present

## 2019-12-13 DIAGNOSIS — K5730 Diverticulosis of large intestine without perforation or abscess without bleeding: Secondary | ICD-10-CM | POA: Diagnosis not present

## 2019-12-13 DIAGNOSIS — N401 Enlarged prostate with lower urinary tract symptoms: Secondary | ICD-10-CM | POA: Diagnosis not present

## 2019-12-13 DIAGNOSIS — D509 Iron deficiency anemia, unspecified: Secondary | ICD-10-CM | POA: Diagnosis not present

## 2019-12-13 DIAGNOSIS — G609 Hereditary and idiopathic neuropathy, unspecified: Secondary | ICD-10-CM | POA: Diagnosis not present

## 2019-12-13 NOTE — Progress Notes (Signed)
Lower extremity venous has been completed.   Preliminary results in CV Proc.   Abram Sander 12/13/2019 11:01 AM

## 2019-12-15 DIAGNOSIS — K589 Irritable bowel syndrome without diarrhea: Secondary | ICD-10-CM | POA: Diagnosis not present

## 2019-12-15 DIAGNOSIS — D509 Iron deficiency anemia, unspecified: Secondary | ICD-10-CM | POA: Diagnosis not present

## 2019-12-15 DIAGNOSIS — Z471 Aftercare following joint replacement surgery: Secondary | ICD-10-CM | POA: Diagnosis not present

## 2019-12-15 DIAGNOSIS — I10 Essential (primary) hypertension: Secondary | ICD-10-CM | POA: Diagnosis not present

## 2019-12-15 DIAGNOSIS — G609 Hereditary and idiopathic neuropathy, unspecified: Secondary | ICD-10-CM | POA: Diagnosis not present

## 2019-12-15 DIAGNOSIS — N138 Other obstructive and reflux uropathy: Secondary | ICD-10-CM | POA: Diagnosis not present

## 2019-12-15 DIAGNOSIS — M150 Primary generalized (osteo)arthritis: Secondary | ICD-10-CM | POA: Diagnosis not present

## 2019-12-15 DIAGNOSIS — K5730 Diverticulosis of large intestine without perforation or abscess without bleeding: Secondary | ICD-10-CM | POA: Diagnosis not present

## 2019-12-15 DIAGNOSIS — K219 Gastro-esophageal reflux disease without esophagitis: Secondary | ICD-10-CM | POA: Diagnosis not present

## 2019-12-15 DIAGNOSIS — N401 Enlarged prostate with lower urinary tract symptoms: Secondary | ICD-10-CM | POA: Diagnosis not present

## 2019-12-17 DIAGNOSIS — N401 Enlarged prostate with lower urinary tract symptoms: Secondary | ICD-10-CM | POA: Diagnosis not present

## 2019-12-17 DIAGNOSIS — D509 Iron deficiency anemia, unspecified: Secondary | ICD-10-CM | POA: Diagnosis not present

## 2019-12-17 DIAGNOSIS — K219 Gastro-esophageal reflux disease without esophagitis: Secondary | ICD-10-CM | POA: Diagnosis not present

## 2019-12-17 DIAGNOSIS — K589 Irritable bowel syndrome without diarrhea: Secondary | ICD-10-CM | POA: Diagnosis not present

## 2019-12-17 DIAGNOSIS — G609 Hereditary and idiopathic neuropathy, unspecified: Secondary | ICD-10-CM | POA: Diagnosis not present

## 2019-12-17 DIAGNOSIS — K5730 Diverticulosis of large intestine without perforation or abscess without bleeding: Secondary | ICD-10-CM | POA: Diagnosis not present

## 2019-12-17 DIAGNOSIS — N138 Other obstructive and reflux uropathy: Secondary | ICD-10-CM | POA: Diagnosis not present

## 2019-12-17 DIAGNOSIS — Z96651 Presence of right artificial knee joint: Secondary | ICD-10-CM | POA: Diagnosis not present

## 2019-12-17 DIAGNOSIS — M150 Primary generalized (osteo)arthritis: Secondary | ICD-10-CM | POA: Diagnosis not present

## 2019-12-17 DIAGNOSIS — I10 Essential (primary) hypertension: Secondary | ICD-10-CM | POA: Diagnosis not present

## 2019-12-17 DIAGNOSIS — Z471 Aftercare following joint replacement surgery: Secondary | ICD-10-CM | POA: Diagnosis not present

## 2019-12-20 DIAGNOSIS — M25561 Pain in right knee: Secondary | ICD-10-CM | POA: Diagnosis not present

## 2019-12-21 ENCOUNTER — Encounter: Payer: Self-pay | Admitting: Sports Medicine

## 2019-12-21 ENCOUNTER — Other Ambulatory Visit: Payer: Self-pay

## 2019-12-21 ENCOUNTER — Ambulatory Visit (INDEPENDENT_AMBULATORY_CARE_PROVIDER_SITE_OTHER): Payer: Medicare HMO | Admitting: Sports Medicine

## 2019-12-21 VITALS — Temp 97.4°F

## 2019-12-21 DIAGNOSIS — M79675 Pain in left toe(s): Secondary | ICD-10-CM

## 2019-12-21 DIAGNOSIS — M79674 Pain in right toe(s): Secondary | ICD-10-CM

## 2019-12-21 DIAGNOSIS — B351 Tinea unguium: Secondary | ICD-10-CM

## 2019-12-21 DIAGNOSIS — G629 Polyneuropathy, unspecified: Secondary | ICD-10-CM

## 2019-12-21 DIAGNOSIS — D689 Coagulation defect, unspecified: Secondary | ICD-10-CM

## 2019-12-21 NOTE — Progress Notes (Signed)
Subjective: Tony Cruz is a 86 y.o. male patient seen today in office with complaint of mildly painful thickened and elongated toenails; unable to trim. Denies any changes with meds, still on Xarelto like before. No other issues.   Patient Active Problem List   Diagnosis Date Noted  . Osteoarthritis of right knee 12/03/2019  . Pain in right knee 12/30/2017  . Anemia, iron deficiency 12/11/2017  . Toe ulcer ([place]) 01/13/2017  . Heterozygous factor V Leiden mutation ([place]) 01/13/2017  . DVT (deep venous thrombosis) ([place]) 08/22/2016  . History of skull fracture 10/19/2015  . History of subdural hematoma 10/19/2015  . Headache(784.0) 09/30/2013  . Sinoatrial node dysfunction ([facility]) 06/01/2013  . Pacemaker-MDT 07/02/2011  . Night sweats 07/02/2011  . DIZZINESS 03/22/2010  . Impaired fasting glucose 11/02/2009  . Obstructive sleep apnea 11/21/2008  . Backache 11/03/2008  . Overweight 11/02/2007  . Essential hypertension 11/02/2007  . ALLERGIC RHINITIS 11/02/2007  . GASTROESOPHAGEAL REFLUX DISEASE 11/02/2007  . Diverticulosis of large intestine 11/02/2007  . Benign prostatic hyperplasia with urinary obstruction 11/02/2007  . SYNCOPE 11/02/2007  . Hereditary and idiopathic peripheral neuropathy 10/30/2007  . IRRITABLE BOWEL SYNDROME 10/30/2007  . Osteoarthritis 10/30/2007    Current Outpatient Medications on File Prior to Visit  Medication Sig Dispense Refill  . acetaminophen (TYLENOL) 500 MG tablet Take 500-1,000 mg by mouth every 6 (six) hours as needed for headache (or pain).     Marland Kitchen ascorbic acid (VITAMIN C) 500 MG tablet Take 500 mg by mouth daily.     . calcium carbonate (OS-CAL) 600 MG TABS Take 600 mg by mouth daily.      . chlorthalidone (HYGROTON) 25 MG tablet Take 1 tablet (25 mg total) by mouth daily. 90 tablet 3  . ferrous sulfate 325 (65 FE) MG tablet Take 325 mg by mouth 2 (two) times daily with a meal.     . finasteride (PROSCAR) 5 MG tablet Take 1 tablet by mouth  daily.    . Lecithin 1200 MG CAPS Take 1 capsule by mouth daily.      Marland Kitchen loratadine (CLARITIN) 10 MG tablet Take 10 mg by mouth daily.      Marland Kitchen losartan (COZAAR) 100 MG tablet Take 1 tablet (100 mg total) by mouth daily. 90 tablet 3  . methocarbamol (ROBAXIN) 500 MG tablet Take 1 tablet (500 mg total) by mouth 4 (four) times daily. 40 tablet 0  . Multiple Vitamin (MULTIVITAMIN) capsule Take 1 capsule by mouth daily.      . naproxen (NAPROSYN) 500 MG tablet TAKE 1 TABLET TWICE A DAY 180 tablet 3  . Omega-3 Fatty Acids (FISH OIL) 1200 MG CAPS Take 1,200 mg by mouth daily.    . ondansetron (ZOFRAN) 4 MG tablet Take 1 tablet (4 mg total) by mouth every 8 (eight) hours as needed for nausea or vomiting. 30 tablet 1  . pantoprazole (PROTONIX) 40 MG tablet TAKE ONE TABLET DAILY. MAKEAN APPOINTMENT FOR FURTHER REFILLS (Patient taking differently: Take 40 mg by mouth daily. ) 90 tablet 3  . Tamsulosin HCl (FLOMAX) 0.4 MG CAPS Take 0.4 mg by mouth daily.      . traMADol (ULTRAM) 50 MG tablet Take 1 tablet (50 mg total) by mouth 3 (three) times daily. (Patient taking differently: Take 50 mg by mouth 2 (two) times daily. ) 90 tablet 5  . XARELTO 15 MG TABS tablet TAKE 1 TABLET DAILY WITH   SUPPER (Patient taking differently: Take 15 mg by mouth daily with  supper. ) 90 tablet 0   Current Facility-Administered Medications on File Prior to Visit  Medication Dose Route Frequency Provider Last Rate Last Admin  . 0.9 %  sodium chloride infusion  500 mL Intravenous Continuous Pyrtle, Lajuan Lines, MD        Allergies  Allergen Reactions  . Shellfish Allergy Rash    Sensitive to shellfish    Objective: Physical Exam  General: Well developed, nourished, no acute distress, awake, alert and oriented x 3  Vascular: Dorsalis pedis artery 1/4 bilateral, Posterior tibial artery 0/4 bilateral, skin temperature warm to warm proximal to distal bilateral lower extremities due to trace edema, + varicosities, pedal hair present  bilateral.  Neurological: Gross sensation present via light touch bilateral.   Dermatological: Skin is warm, dry, and supple bilateral, Nails 1-10 are tender, long, thick, and discolored with mild subungal debris, no webspace macerations present bilateral, no open lesions present bilateral, no callus/corns/hyperkeratotic tissue present bilateral. No signs of infection bilateral.  Musculoskeletal:  Asymptomatic claw/hammertoes boney deformities noted bilateral. Muscular strength within normal limits without painon range of motion. No pain with calf compression bilateral.  Assessment and Plan:  Problem List Items Addressed This Visit    None    Visit Diagnoses    Pain due to onychomycosis of toenails of both feet    -  Primary   Neuropathy       Coagulation defect ([place])         -Examined patient.  -Mechanically debrided and reduced mycotic nails with sterile nail nipper and dremel nail file without incident. -Recommend daily inspection in setting of neuropathy like before  -Patient to return in 3 months for follow up evaluation or sooner if symptoms worsen.  Landis Martins, DPM

## 2019-12-23 DIAGNOSIS — M25561 Pain in right knee: Secondary | ICD-10-CM | POA: Diagnosis not present

## 2019-12-28 DIAGNOSIS — M25561 Pain in right knee: Secondary | ICD-10-CM | POA: Diagnosis not present

## 2019-12-30 DIAGNOSIS — M25561 Pain in right knee: Secondary | ICD-10-CM | POA: Diagnosis not present

## 2020-01-03 DIAGNOSIS — M25561 Pain in right knee: Secondary | ICD-10-CM | POA: Diagnosis not present

## 2020-01-06 DIAGNOSIS — M25561 Pain in right knee: Secondary | ICD-10-CM | POA: Diagnosis not present

## 2020-01-10 DIAGNOSIS — M25561 Pain in right knee: Secondary | ICD-10-CM | POA: Diagnosis not present

## 2020-01-13 DIAGNOSIS — M25561 Pain in right knee: Secondary | ICD-10-CM | POA: Diagnosis not present

## 2020-01-17 DIAGNOSIS — M25561 Pain in right knee: Secondary | ICD-10-CM | POA: Diagnosis not present

## 2020-01-20 DIAGNOSIS — M25561 Pain in right knee: Secondary | ICD-10-CM | POA: Diagnosis not present

## 2020-02-15 DIAGNOSIS — N138 Other obstructive and reflux uropathy: Secondary | ICD-10-CM | POA: Diagnosis not present

## 2020-02-15 DIAGNOSIS — R339 Retention of urine, unspecified: Secondary | ICD-10-CM | POA: Diagnosis not present

## 2020-02-15 DIAGNOSIS — N401 Enlarged prostate with lower urinary tract symptoms: Secondary | ICD-10-CM | POA: Diagnosis not present

## 2020-02-17 DIAGNOSIS — L821 Other seborrheic keratosis: Secondary | ICD-10-CM | POA: Diagnosis not present

## 2020-02-17 DIAGNOSIS — L723 Sebaceous cyst: Secondary | ICD-10-CM | POA: Diagnosis not present

## 2020-02-17 DIAGNOSIS — L570 Actinic keratosis: Secondary | ICD-10-CM | POA: Diagnosis not present

## 2020-02-17 DIAGNOSIS — Z85828 Personal history of other malignant neoplasm of skin: Secondary | ICD-10-CM | POA: Diagnosis not present

## 2020-02-17 DIAGNOSIS — L814 Other melanin hyperpigmentation: Secondary | ICD-10-CM | POA: Diagnosis not present

## 2020-02-17 DIAGNOSIS — D225 Melanocytic nevi of trunk: Secondary | ICD-10-CM | POA: Diagnosis not present

## 2020-02-17 DIAGNOSIS — D1801 Hemangioma of skin and subcutaneous tissue: Secondary | ICD-10-CM | POA: Diagnosis not present

## 2020-02-22 DIAGNOSIS — D649 Anemia, unspecified: Secondary | ICD-10-CM | POA: Diagnosis not present

## 2020-02-22 DIAGNOSIS — Z0000 Encounter for general adult medical examination without abnormal findings: Secondary | ICD-10-CM | POA: Diagnosis not present

## 2020-02-22 DIAGNOSIS — I10 Essential (primary) hypertension: Secondary | ICD-10-CM | POA: Diagnosis not present

## 2020-02-22 DIAGNOSIS — Z7689 Persons encountering health services in other specified circumstances: Secondary | ICD-10-CM | POA: Diagnosis not present

## 2020-02-22 DIAGNOSIS — R7309 Other abnormal glucose: Secondary | ICD-10-CM | POA: Diagnosis not present

## 2020-02-28 DIAGNOSIS — Z471 Aftercare following joint replacement surgery: Secondary | ICD-10-CM | POA: Diagnosis not present

## 2020-02-28 DIAGNOSIS — Z96651 Presence of right artificial knee joint: Secondary | ICD-10-CM | POA: Diagnosis not present

## 2020-02-29 DIAGNOSIS — Z0000 Encounter for general adult medical examination without abnormal findings: Secondary | ICD-10-CM | POA: Diagnosis not present

## 2020-02-29 DIAGNOSIS — Z86718 Personal history of other venous thrombosis and embolism: Secondary | ICD-10-CM | POA: Diagnosis not present

## 2020-02-29 DIAGNOSIS — Z950 Presence of cardiac pacemaker: Secondary | ICD-10-CM | POA: Diagnosis not present

## 2020-02-29 DIAGNOSIS — M1990 Unspecified osteoarthritis, unspecified site: Secondary | ICD-10-CM | POA: Diagnosis not present

## 2020-02-29 DIAGNOSIS — I10 Essential (primary) hypertension: Secondary | ICD-10-CM | POA: Diagnosis not present

## 2020-02-29 DIAGNOSIS — K219 Gastro-esophageal reflux disease without esophagitis: Secondary | ICD-10-CM | POA: Diagnosis not present

## 2020-02-29 DIAGNOSIS — D692 Other nonthrombocytopenic purpura: Secondary | ICD-10-CM | POA: Diagnosis not present

## 2020-02-29 DIAGNOSIS — G4733 Obstructive sleep apnea (adult) (pediatric): Secondary | ICD-10-CM | POA: Diagnosis not present

## 2020-02-29 DIAGNOSIS — G609 Hereditary and idiopathic neuropathy, unspecified: Secondary | ICD-10-CM | POA: Diagnosis not present

## 2020-02-29 DIAGNOSIS — D6869 Other thrombophilia: Secondary | ICD-10-CM | POA: Diagnosis not present

## 2020-03-01 DIAGNOSIS — R82998 Other abnormal findings in urine: Secondary | ICD-10-CM | POA: Diagnosis not present

## 2020-03-02 DIAGNOSIS — Z1212 Encounter for screening for malignant neoplasm of rectum: Secondary | ICD-10-CM | POA: Diagnosis not present

## 2020-03-02 LAB — IFOBT (OCCULT BLOOD): IFOBT: POSITIVE

## 2020-03-03 DIAGNOSIS — M25561 Pain in right knee: Secondary | ICD-10-CM | POA: Diagnosis not present

## 2020-03-06 DIAGNOSIS — M25561 Pain in right knee: Secondary | ICD-10-CM | POA: Diagnosis not present

## 2020-03-09 DIAGNOSIS — M25561 Pain in right knee: Secondary | ICD-10-CM | POA: Diagnosis not present

## 2020-03-09 DIAGNOSIS — K921 Melena: Secondary | ICD-10-CM | POA: Diagnosis not present

## 2020-03-23 ENCOUNTER — Ambulatory Visit: Payer: Medicare HMO | Admitting: Sports Medicine

## 2020-03-30 ENCOUNTER — Ambulatory Visit: Payer: Medicare HMO | Admitting: Sports Medicine

## 2020-03-30 ENCOUNTER — Other Ambulatory Visit: Payer: Self-pay

## 2020-03-30 ENCOUNTER — Encounter: Payer: Self-pay | Admitting: Sports Medicine

## 2020-03-30 DIAGNOSIS — M79675 Pain in left toe(s): Secondary | ICD-10-CM

## 2020-03-30 DIAGNOSIS — D689 Coagulation defect, unspecified: Secondary | ICD-10-CM

## 2020-03-30 DIAGNOSIS — M79674 Pain in right toe(s): Secondary | ICD-10-CM | POA: Diagnosis not present

## 2020-03-30 DIAGNOSIS — B351 Tinea unguium: Secondary | ICD-10-CM | POA: Diagnosis not present

## 2020-03-30 DIAGNOSIS — G629 Polyneuropathy, unspecified: Secondary | ICD-10-CM

## 2020-03-30 NOTE — Progress Notes (Signed)
Subjective: Tony Cruz is a 86 y.o. male patient seen today in office with complaint of mildly painful thickened and elongated toenails; unable to trim. Denies any changes with meds, still on Xarelto like before without any changes but does report that his doctors are checking him for a GI bleed. No other issues.   Patient Active Problem List   Diagnosis Date Noted   Osteoarthritis of right knee 12/03/2019   Pain in right knee 12/30/2017   Anemia, iron deficiency 12/11/2017   Toe ulcer ([place]) 01/13/2017   Heterozygous factor V Leiden mutation ([place]) 01/13/2017   DVT (deep venous thrombosis) ([place]) 08/22/2016   History of skull fracture 10/19/2015   History of subdural hematoma 10/19/2015   Headache(784.0) 09/30/2013   Sinoatrial node dysfunction ([place]) 06/01/2013   Pacemaker-MDT 07/02/2011   Night sweats 07/02/2011   DIZZINESS 03/22/2010   Impaired fasting glucose 11/02/2009   Obstructive sleep apnea 11/21/2008   Backache 11/03/2008   Overweight 11/02/2007   Essential hypertension 11/02/2007   ALLERGIC RHINITIS 11/02/2007   GASTROESOPHAGEAL REFLUX DISEASE 11/02/2007   Diverticulosis of large intestine 11/02/2007   Benign prostatic hyperplasia with urinary obstruction 11/02/2007   SYNCOPE 11/02/2007   Hereditary and idiopathic peripheral neuropathy 10/30/2007   IRRITABLE BOWEL SYNDROME 10/30/2007   Osteoarthritis 10/30/2007    Current Outpatient Medications on File Prior to Visit  Medication Sig Dispense Refill   acetaminophen (TYLENOL) 500 MG tablet Take 500-1,000 mg by mouth every 6 (six) hours as needed for headache (or pain).      ascorbic acid (VITAMIN C) 500 MG tablet Take 500 mg by mouth daily.      calcium carbonate (OS-CAL) 600 MG TABS Take 600 mg by mouth daily.       chlorthalidone (HYGROTON) 25 MG tablet Take 1 tablet (25 mg total) by mouth daily. 90 tablet 3   ferrous sulfate 325 (65 FE) MG tablet Take 325 mg by mouth 2 (two)  times daily with a meal.      finasteride (PROSCAR) 5 MG tablet Take 1 tablet by mouth daily.     Lecithin 1200 MG CAPS Take 1 capsule by mouth daily.       loratadine (CLARITIN) 10 MG tablet Take 10 mg by mouth daily.       losartan (COZAAR) 100 MG tablet Take 1 tablet (100 mg total) by mouth daily. 90 tablet 3   methocarbamol (ROBAXIN) 500 MG tablet Take 1 tablet (500 mg total) by mouth 4 (four) times daily. 40 tablet 0   Multiple Vitamin (MULTIVITAMIN) capsule Take 1 capsule by mouth daily.       naproxen (NAPROSYN) 500 MG tablet TAKE 1 TABLET TWICE A DAY 180 tablet 3   Omega-3 Fatty Acids (FISH OIL) 1200 MG CAPS Take 1,200 mg by mouth daily.     ondansetron (ZOFRAN) 4 MG tablet Take 1 tablet (4 mg total) by mouth every 8 (eight) hours as needed for nausea or vomiting. 30 tablet 1   pantoprazole (PROTONIX) 40 MG tablet TAKE ONE TABLET DAILY. MAKEAN APPOINTMENT FOR FURTHER REFILLS (Patient taking differently: Take 40 mg by mouth daily. ) 90 tablet 3   Tamsulosin HCl (FLOMAX) 0.4 MG CAPS Take 0.4 mg by mouth daily.       traMADol (ULTRAM) 50 MG tablet Take 1 tablet (50 mg total) by mouth 3 (three) times daily. (Patient taking differently: Take 50 mg by mouth 2 (two) times daily. ) 90 tablet 5   XARELTO 15 MG TABS tablet TAKE 1  TABLET DAILY WITH   SUPPER (Patient taking differently: Take 15 mg by mouth daily with supper. ) 90 tablet 0   Current Facility-Administered Medications on File Prior to Visit  Medication Dose Route Frequency Provider Last Rate Last Admin   0.9 %  sodium chloride infusion  500 mL Intravenous Continuous Pyrtle, Lajuan Lines, MD        Allergies  Allergen Reactions   Shellfish Allergy Rash    Sensitive to shellfish    Objective: Physical Exam  General: Well developed, nourished, no acute distress, awake, alert and oriented x 3  Vascular: Dorsalis pedis artery 1/4 bilateral, Posterior tibial artery 0/4 bilateral, skin temperature warm to warm proximal to  distal bilateral lower extremities due to trace edema, + varicosities, pedal hair present bilateral.  Neurological: Gross sensation present via light touch bilateral.   Dermatological: Skin is warm, dry, and supple bilateral, Nails 1-10 are tender, long, thick, and discolored with mild subungal debris, no webspace macerations present bilateral, no open lesions present bilateral, no callus/corns/hyperkeratotic tissue present bilateral. No signs of infection bilateral.  Musculoskeletal:  Asymptomatic claw/hammertoes boney deformities noted bilateral. Muscular strength within normal limits without painon range of motion. No pain with calf compression bilateral.  Assessment and Plan:  Problem List Items Addressed This Visit    None    Visit Diagnoses    Pain due to onychomycosis of toenails of both feet    -  Primary   Neuropathy       Coagulation defect ([place])         -Examined patient.  -Discussed with patient importance of daily foot inspection in the setting of neuropathy -Mechanically debrided and reduced mycotic nails with sterile nail nipper and dremel nail file without incident. -Recommend good supportive shoes daily for foot type -Patient to return in 3 months for follow up evaluation or sooner if symptoms worsen.  Landis Martins, DPM

## 2020-03-31 ENCOUNTER — Encounter: Payer: Self-pay | Admitting: Physician Assistant

## 2020-05-03 DIAGNOSIS — Z23 Encounter for immunization: Secondary | ICD-10-CM | POA: Diagnosis not present

## 2020-05-08 DIAGNOSIS — G4733 Obstructive sleep apnea (adult) (pediatric): Secondary | ICD-10-CM | POA: Diagnosis not present

## 2020-05-15 ENCOUNTER — Encounter: Payer: Self-pay | Admitting: Physician Assistant

## 2020-05-15 ENCOUNTER — Telehealth: Payer: Self-pay

## 2020-05-15 ENCOUNTER — Ambulatory Visit: Payer: Medicare HMO | Admitting: Physician Assistant

## 2020-05-15 VITALS — BP 106/72 | HR 86 | Ht 67.5 in | Wt 219.0 lb

## 2020-05-15 DIAGNOSIS — R195 Other fecal abnormalities: Secondary | ICD-10-CM | POA: Diagnosis not present

## 2020-05-15 DIAGNOSIS — Z86010 Personal history of colonic polyps: Secondary | ICD-10-CM

## 2020-05-15 MED ORDER — PLENVU 140 G PO SOLR
1.0000 | ORAL | 0 refills | Status: DC
Start: 2020-05-15 — End: 2020-07-10

## 2020-05-15 NOTE — Progress Notes (Signed)
Subjective:    Patient ID: Tony Cruz, male    DOB: [birth year], 86 y.o.   MRN: [id number]  HPI Tony Cruz is a pleasant 86 year old white male, known to Dr. Hilarie Fredrickson who is referred back today by Dr. Ardeth Perfect for evaluation of Hemoccult positive stool. Patient has history of hypertension, pacemaker placement, prior DVT and factor V Leyden mutation for which she is maintained on Xarelto.  Also with history of sleep apnea, GERD, IBS, BPH, peripheral neuropathy and is status post cholecystectomy and appendectomy. He had undergone EGD and colonoscopy in 2018 for iron deficiency anemia.  At EGD he had a small hiatal hernia, multiple sessile gastric polyps consistent with benign fundic gland polyps.  And a colonoscopy had 4 polyps removed the largest was 6 mm, noted to have multiple diverticuli and internal hemorrhoids.  Path on the polyp showed adenomatous polyps and one inflammatory polyp. Patient had recent labs done showing hemoglobin of 15 hematocrit of 48 ferritin of 40 serum iron 17 iron saturation of 23.  He did  Hemoccults x3 which were positive and a Hemosure which was also positive. Patient has been maintained on oral iron and says he tolerates this well.  He says his stools stay on the dark side because of the iron, he has not noted any melena or hematochezia, has not had any changes in bowel habits, no abdominal pain.  No heartburn indigestion or dysphagia.  He wonders whether he needs to stay on the iron supplement. He has follow-up with his cardiologist [name] coming up in early November.  Review of Systems Pertinent positive and negative review of systems were noted in the above HPI section.  All other review of systems was otherwise negative.  Outpatient Encounter Medications as of 05/15/2020  Medication Sig  . acetaminophen (TYLENOL) 500 MG tablet Take 500-1,000 mg by mouth every 6 (six) hours as needed for headache (or pain).   Marland Kitchen ascorbic acid (VITAMIN C) 500 MG tablet Take 500  mg by mouth daily.   . calcium carbonate (OS-CAL) 600 MG TABS Take 600 mg by mouth daily.    . chlorthalidone (HYGROTON) 25 MG tablet Take 1 tablet (25 mg total) by mouth daily.  . cholecalciferol (VITAMIN D3) 25 MCG (1000 UNIT) tablet Take 1,000 Units by mouth daily.  . ferrous sulfate 325 (65 FE) MG tablet Take 325 mg by mouth 2 (two) times daily with a meal.   . finasteride (PROSCAR) 5 MG tablet Take 1 tablet by mouth daily.  . Lecithin 1200 MG CAPS Take 1 capsule by mouth daily.    Marland Kitchen loratadine (CLARITIN) 10 MG tablet Take 10 mg by mouth daily.    Marland Kitchen losartan (COZAAR) 100 MG tablet Take 1 tablet (100 mg total) by mouth daily.  . Multiple Vitamin (MULTIVITAMIN) capsule Take 1 capsule by mouth daily.    . naproxen (NAPROSYN) 500 MG tablet TAKE 1 TABLET TWICE A DAY  . Omega-3 Fatty Acids (FISH OIL) 1200 MG CAPS Take 1,200 mg by mouth daily.  . pantoprazole (PROTONIX) 40 MG tablet TAKE ONE TABLET DAILY. MAKEAN APPOINTMENT FOR FURTHER REFILLS (Patient taking differently: Take 40 mg by mouth daily. )  . tamsulosin (FLOMAX) 0.4 MG CAPS capsule Take 0.4 mg by mouth daily.  . traMADol (ULTRAM) 50 MG tablet Take 50 mg by mouth 3 (three) times daily as needed.  Alveda Reasons 15 MG TABS tablet TAKE 1 TABLET DAILY WITH   SUPPER (Patient taking differently: Take 15 mg by mouth daily with supper. )  .  PEG-KCl-NaCl-NaSulf-Na Asc-C (PLENVU) 140 g SOLR Take 1 kit by mouth as directed.   Facility-Administered Encounter Medications as of 05/15/2020  Medication  . 0.9 %  sodium chloride infusion   Allergies  Allergen Reactions  . Shellfish Allergy Rash    Sensitive to shellfish   Patient Active Problem List   Diagnosis Date Noted  . Osteoarthritis of right knee 12/03/2019  . Pain in right knee 12/30/2017  . Anemia, iron deficiency 12/11/2017  . Toe ulcer ([place]) 01/13/2017  . Heterozygous factor V Leiden mutation ([place]) 01/13/2017  . DVT (deep venous thrombosis) ([place]) 08/22/2016  . History of skull  fracture 10/19/2015  . History of subdural hematoma 10/19/2015  . Headache(784.0) 09/30/2013  . Sinoatrial node dysfunction ([facility]) 06/01/2013  . Pacemaker-MDT 07/02/2011  . Night sweats 07/02/2011  . DIZZINESS 03/22/2010  . Impaired fasting glucose 11/02/2009  . Obstructive sleep apnea 11/21/2008  . Backache 11/03/2008  . Overweight 11/02/2007  . Essential hypertension 11/02/2007  . ALLERGIC RHINITIS 11/02/2007  . GASTROESOPHAGEAL REFLUX DISEASE 11/02/2007  . Diverticulosis of large intestine 11/02/2007  . Benign prostatic hyperplasia with urinary obstruction 11/02/2007  . SYNCOPE 11/02/2007  . Hereditary and idiopathic peripheral neuropathy 10/30/2007  . IRRITABLE BOWEL SYNDROME 10/30/2007  . Osteoarthritis 10/30/2007   Social History   Socioeconomic History  . Marital status: Married    Spouse name: Primary school teacher  . Number of children: Not on file  . Years of education: Not on file  . Highest education level: Not on file  Occupational History  . Occupation: Retired    Fish farm manager: RETIRED  Tobacco Use  . Smoking status: Never Smoker  . Smokeless tobacco: Never Used  Vaping Use  . Vaping Use: Never used  Substance and Sexual Activity  . Alcohol use: Yes    Alcohol/week: 0.0 standard drinks    Comment: occasionally   . Drug use: No  . Sexual activity: Never    Birth control/protection: Abstinence  Other Topics Concern  . Not on file  Social History Narrative  . Not on file   Social Determinants of Health   Financial Resource Strain:   . Difficulty of Paying Living Expenses: Not on file  Food Insecurity:   . Worried About Charity fundraiser in the Last Year: Not on file  . Ran Out of Food in the Last Year: Not on file  Transportation Needs:   . Lack of Transportation (Medical): Not on file  . Lack of Transportation (Non-Medical): Not on file  Physical Activity:   . Days of Exercise per Week: Not on file  . Minutes of Exercise per Session: Not on file    Stress:   . Feeling of Stress : Not on file  Social Connections:   . Frequency of Communication with Friends and Family: Not on file  . Frequency of Social Gatherings with Friends and Family: Not on file  . Attends Religious Services: Not on file  . Active Member of Clubs or Organizations: Not on file  . Attends Archivist Meetings: Not on file  . Marital Status: Not on file  Intimate Partner Violence:   . Fear of Current or Ex-Partner: Not on file  . Emotionally Abused: Not on file  . Physically Abused: Not on file  . Sexually Abused: Not on file    Tony Cruz family history includes Bladder Cancer in his father; Coronary artery disease in his father and mother; Diabetes in his sister; Prostate cancer in his father.  Objective:    Vitals:   05/15/20 1028  BP: 106/72  Pulse: 86    Physical Exam Well-developed well-nourished elderly WM in no acute distress.  Height, Weight,219 BMI 33.7  HEENT; nontraumatic normocephalic, EOMI, PER R LA, sclera anicteric. Oropharynx;not examined Neck; supple, no JVD Cardiovascular; regular rate and rhythm with S1-S2, no murmur rub or gallop Pulmonary; Clear bilaterally Abdomen; soft, nontender, nondistended, no palpable mass or hepatosplenomegaly, bowel sounds are active Rectal;not done today - recent heme + Skin; benign exam, no jaundice rash or appreciable lesions Extremities; no clubbing cyanosis or edema skin warm and dry Neuro/Psych; alert and oriented x4, grossly nonfocal mood and affect appropriate       Assessment & Plan:   #57 86 year old white male with recent positive Hemoccults x3 and hemeosure positive x1.  This is in the setting of chronic Xarelto. Patient has prior history of iron deficiency anemia, most recent hemoglobin 15 and iron studies within normal range on oral iron. Last endoscopic evaluation in 2018 with no definite source for iron deficiency found.  He did have 4 polyps removed 3 of which were  adenomas, and has multiple small sessile gastric polyps consistent with benign fundic gland polyps.  #2 status post pacemaker 3.  History of DVT 4.  Factor V Leyden deficiency-on chronic Xarelto 5.  Peripheral neuropathy 6.  BPH 7.  GERD 8.  Sleep apnea/no oxygen requirement 9.  Hypertension 10.  IBS 11.  Diverticulosis  Plan Discussed repeat endoscopic evaluation with the patient.  If he is to undergo endoscopic evaluation would favor colonoscopy and EGD.  He is agreeable to proceed.  Both procedures discussed in detail with patient including indications risks and benefits and he is agreeable to proceed. Patient would like to have follow-up with his cardiologist/[name] prior to proceeding with colonoscopy and we will therefore schedule for December. We will need to hold Xarelto for 24 hours prior to procedures.  I believe Xarelto is being prescribed by his PCP Dr. Ardeth Perfect. He has completed COVID-19 vaccination. Advised to continue oral iron supplementation, and should have repeat CBC and iron studies every 4 to 6 months.   Shevelle Smither S Hetvi Shawhan PA-C 05/15/2020   Cc: Velna Hatchet, MD

## 2020-05-15 NOTE — Telephone Encounter (Signed)
   Primary Cardiologist: Virl Axe, MD  Chart reviewed as part of pre-operative protocol coverage.  We were asked for guidance on holding xarelto. Patient takes [facility] for Factor V Leiden and DVT, both managed by PCP. Please reach out to them for anticoagulation holds.     Tami Lin Nandi Tonnesen, PA 05/15/2020, 3:06 PM

## 2020-05-15 NOTE — Patient Instructions (Addendum)
If you are age 84 or older, your body mass index should be between 23-30. Your Body mass index is 33.79 kg/m. If this is out of the aforementioned range listed, please consider follow up with your Primary Care Provider.  If you are age 59 or younger, your body mass index should be between 19-25. Your Body mass index is 33.79 kg/m. If this is out of the aformentioned range listed, please consider follow up with your Primary Care Provider.   You have been scheduled for a colonoscopy. Please follow written instructions given to you at your visit today.  Please pick up your prep supplies at the pharmacy within the next 1-3 days. If you use inhalers (even only as needed), please bring them with you on the day of your procedure.  You will be contacted by our office prior to your procedure for directions on holding your Xarelto.  If you do not hear from our office 1 week prior to your scheduled procedure, please call [phone] to discuss.   Follow up pending the results of your Colonoscopy/Endopscopy.

## 2020-05-15 NOTE — Telephone Encounter (Signed)
[facility] Pre-operative Risk Assessment     Request for surgical clearance:     Endoscopy Procedure  What type of surgery is being performed?     Colonoscopy and Endoscopy  When is this surgery scheduled?     12/  What type of clearance is required ?   Pharmacy  Are there any medications that need to be held prior to surgery and how long? Xarelto  Hours prior   Practice name and name of physician performing surgery?      [facility]  What is your office phone and fax number?      Phone- [phone]  [phone]  Anesthesia type (None, local, MAC, general) ?       MAC /

## 2020-05-16 NOTE — Telephone Encounter (Signed)
Message has been sent to patient's primary care

## 2020-05-23 NOTE — Progress Notes (Signed)
Addendum: Reviewed and agree with assessment and management plan. Makye Radle M, MD  

## 2020-05-25 ENCOUNTER — Ambulatory Visit (INDEPENDENT_AMBULATORY_CARE_PROVIDER_SITE_OTHER): Payer: Medicare HMO

## 2020-05-25 DIAGNOSIS — I495 Sick sinus syndrome: Secondary | ICD-10-CM

## 2020-05-25 LAB — CUP PACEART REMOTE DEVICE CHECK
Battery Remaining Percentage: 75 %
Brady Statistic RA Percent Paced: 30 %
Brady Statistic RV Percent Paced: 1 %
Date Time Interrogation Session: 20211028084827
Implantable Lead Implant Date: 19990330
Implantable Lead Implant Date: 19990330
Implantable Lead Location: 753859
Implantable Lead Location: 753860
Implantable Lead Model: 5068
Implantable Lead Model: 5092
Implantable Pulse Generator Implant Date: 20181024
Lead Channel Impedance Value: 371 Ohm
Lead Channel Impedance Value: 527 Ohm
Lead Channel Pacing Threshold Amplitude: 0.8 V
Lead Channel Pacing Threshold Amplitude: 0.9 V
Lead Channel Pacing Threshold Pulse Width: 0.4 ms
Lead Channel Pacing Threshold Pulse Width: 0.4 ms
Lead Channel Sensing Intrinsic Amplitude: 1.1 mV
Lead Channel Sensing Intrinsic Amplitude: 3.1 mV
Lead Channel Setting Pacing Amplitude: 2 V
Lead Channel Setting Pacing Amplitude: 2.4 V
Lead Channel Setting Pacing Pulse Width: 0.4 ms
Pulse Gen Model: 407145
Pulse Gen Serial Number: 69203177

## 2020-05-30 NOTE — Progress Notes (Signed)
Remote pacemaker transmission.   

## 2020-05-31 NOTE — Telephone Encounter (Signed)
Received confirmation from patient's primary care that he is okay to hold his Xarelto 24 hours prior to his procedure. I have left a voice mail to for him to call back to discuss this.

## 2020-06-05 ENCOUNTER — Telehealth: Payer: Self-pay | Admitting: Internal Medicine

## 2020-06-06 NOTE — Telephone Encounter (Signed)
Patient has been notified about holding Xarelto. See clearance note.

## 2020-06-06 NOTE — Telephone Encounter (Signed)
Patient has been notified about when to stop his Xarelto.

## 2020-06-12 DIAGNOSIS — R69 Illness, unspecified: Secondary | ICD-10-CM | POA: Diagnosis not present

## 2020-06-20 DIAGNOSIS — H2513 Age-related nuclear cataract, bilateral: Secondary | ICD-10-CM | POA: Diagnosis not present

## 2020-06-20 DIAGNOSIS — H40013 Open angle with borderline findings, low risk, bilateral: Secondary | ICD-10-CM | POA: Diagnosis not present

## 2020-06-20 DIAGNOSIS — R69 Illness, unspecified: Secondary | ICD-10-CM | POA: Diagnosis not present

## 2020-06-29 ENCOUNTER — Ambulatory Visit: Payer: Medicare HMO | Admitting: Sports Medicine

## 2020-07-02 NOTE — Progress Notes (Deleted)
Cardiology Office Note Date:  07/02/2020  Patient ID:  Tony Cruz, DOB Jan 09, 1934, MRN [id number] PCP:  Velna Hatchet, MD  Cardiologist:  [name] Pulmonology: Dr. Lenna Gilford    Chief Complaint: *** pre-colonoscopy?  History of Present Illness: Tony Cruz is a 86 y.o. male with history of sinus node dysfunction w/PPM, syncope thought to be vasovagal/post prandial (rate drop response was programmed on), HTN, OA, barrett's esophagus, DVT Dec 2017 and tx w/xarelto, was decreased to 33m though after repeat LE UKoreashowed persistent thrombus and mention of factor V Leiden was maintained on a/c.  He developed iron def anemia and +FOB, and underwent EGD/colonoscopy without cause for anemia found, and cleared to resume a/c.  He comes in today to be seen for Dr. KCaryl Comes last seen by him an 2021, he was playing golf 3-4 days/week.  Mentioned balance neuropathy. Noted significant edema, ? If amlodipine and was discontinued and resumed on chlorthalidone, discussed sodium reduction   *** pending colonoscopy? *** PMD to manage xarelto (noted this has been addressed) *** symptoms *** labs. Lipids...  Device information: MDT dual chamber PPM, implanted 06/19/2007, Gen change 05/21/17 >>> Biotronik CLS device   Past Medical History:  Diagnosis Date  . Allergy   . Anal fissure   . Anemia   . Backache, unspecified   . Barrett's esophagus   . Diverticulosis of colon (without mention of hemorrhage)   . DVT of leg (deep venous thrombosis) ([facility]   . Esophageal reflux   . Factor V Leiden deficiency ([facility]   . hypertension   . Hypertrophy of prostate with urinary obstruction and other lower urinary tract symptoms (LUTS)   . Irritable bowel syndrome   . Obstructive sleep apnea   . Osteoarthrosis, unspecified whether generalized or localized, unspecified site   . Other abnormal glucose   . Overweight(278.02)   . Pacemaker-MDT    Change out 2008  . Sleep apnea 2016   Uses cpap  .  Syncope and collapse   . Tubular adenoma of colon   . Unspecified hereditary and idiopathic peripheral neuropathy     Past Surgical History:  Procedure Laterality Date  . APPENDECTOMY    . CHOLECYSTECTOMY    . PACEMAKER PLACEMENT    . PPM GENERATOR CHANGEOUT N/A 05/21/2017   Procedure: PPM GENERATOR CHANGEOUT;  Surgeon: KDeboraha Sprang MD;  [facility];  Service: Cardiovascular;  Laterality: N/A;  . skin cancer removed  12/2012   removed from his face--Dr. GSarajane Jews . TONSILLECTOMY    . TOTAL KNEE ARTHROPLASTY Right 12/03/2019   Procedure: TOTAL KNEE ARTHROPLASTY;  Surgeon: CSydnee Cabal MD;  [facility]: [facility];  Service: Orthopedics;  Laterality: Right;  adductor canal    Current Outpatient Medications  Medication Sig Dispense Refill  . acetaminophen (TYLENOL) 500 MG tablet Take 500-1,000 mg by mouth every 6 (six) hours as needed for headache (or pain).     .Marland Kitchenascorbic acid (VITAMIN C) 500 MG tablet Take 500 mg by mouth daily.     . calcium carbonate (OS-CAL) 600 MG TABS Take 600 mg by mouth daily.      . chlorthalidone (HYGROTON) 25 MG tablet Take 1 tablet (25 mg total) by mouth daily. 90 tablet 3  . cholecalciferol (VITAMIN D3) 25 MCG (1000 UNIT) tablet Take 1,000 Units by mouth daily.    . ferrous sulfate 325 (65 FE) MG tablet Take 325 mg by mouth 2 (two) times daily with a meal.     .  finasteride (PROSCAR) 5 MG tablet Take 1 tablet by mouth daily.    . Lecithin 1200 MG CAPS Take 1 capsule by mouth daily.      Marland Kitchen loratadine (CLARITIN) 10 MG tablet Take 10 mg by mouth daily.      Marland Kitchen losartan (COZAAR) 100 MG tablet Take 1 tablet (100 mg total) by mouth daily. 90 tablet 3  . Multiple Vitamin (MULTIVITAMIN) capsule Take 1 capsule by mouth daily.      . naproxen (NAPROSYN) 500 MG tablet TAKE 1 TABLET TWICE A DAY 180 tablet 3  . Omega-3 Fatty Acids (FISH OIL) 1200 MG CAPS Take 1,200 mg by mouth daily.    . pantoprazole (PROTONIX) 40 MG tablet TAKE ONE TABLET DAILY. MAKEAN  APPOINTMENT FOR FURTHER REFILLS (Patient taking differently: Take 40 mg by mouth daily. ) 90 tablet 3  . PEG-KCl-NaCl-NaSulf-Na Asc-C (PLENVU) 140 g SOLR Take 1 kit by mouth as directed. 1 each 0  . tamsulosin (FLOMAX) 0.4 MG CAPS capsule Take 0.4 mg by mouth daily.    . traMADol (ULTRAM) 50 MG tablet Take 50 mg by mouth 3 (three) times daily as needed.    Alveda Reasons 15 MG TABS tablet TAKE 1 TABLET DAILY WITH   SUPPER (Patient taking differently: Take 15 mg by mouth daily with supper. ) 90 tablet 0   Current Facility-Administered Medications  Medication Dose Route Frequency Provider Last Rate Last Admin  . 0.9 %  sodium chloride infusion  500 mL Intravenous Continuous Pyrtle, Lajuan Lines, MD        Allergies:   Shellfish allergy   Social History:  The patient  reports that he has never smoked. He has never used smokeless tobacco. He reports current alcohol use. He reports that he does not use drugs.   Family History:  The patient's family history includes Bladder Cancer in his father; Coronary artery disease in his father and mother; Diabetes in his sister; Prostate cancer in his father.  ROS:  Please see the history of present illness. All other systems are reviewed and otherwise negative.   PHYSICAL EXAM:  VS:  There were no vitals taken for this visit. BMI: There is no height or weight on file to calculate BMI. Well nourished, well developed, in no acute distress  HEENT: normocephalic, atraumatic  Neck: no JVD, carotid bruits or masses Cardiac:  *** RRR; no significant murmurs, no rubs, or gallops Lungs:  *** CTA b/l, no wheezing, rhonchi or rales  Abd: soft, nontender, obese MS: no deformity or atrophy Ext: *** trace if any edema  Skin: warm and dry, no rash Neuro:  No gross deficits appreciated Psych: euthymic mood, full affect  *** PPM site is stable, no tethering or discomfort   EKG:  Done today and reviewed by myself ***   PPM interrogation : done today and reviewed by  myself:  ***  05/14/16: ETT  Blood pressure demonstrated a normal response to exercise.  There was no ST segment deviation noted during stress. Baseline EKG demonstrates RBBB with T wave inversions in V1 and V2.  No evidence of exercise induced ischemia at an adequate HR reaching 99% MPHR.  Moderately impaired exercise tolerance. The patient exercised to 6.4 mets.  This is a low risk study.  05/14/16: TTE Study Conclusions - Left ventricle: The cavity size was normal. Wall thickness was   normal. Systolic function was normal. The estimated ejection   fraction was in the range of 50% to 55%. Wall motion was normal;  there were no regional wall motion abnormalities. Doppler   parameters are consistent with abnormal left ventricular   relaxation (grade 1 diastolic dysfunction). - Aortic valve: There was mild regurgitation. - Ascending aorta: The ascending aorta was mildly dilated. - Left atrium: The atrium was mildly dilated. Impressions: - Normal LV systolic function; grade 1 diastolic dysfunction; mild   AI; mild LAE; ascending aorta measures 4.5 cm; suggest CTA or MRA   to further assess.  10/224/18: CT chest IMPRESSION: Aneurysmal ascending aorta measuring up to 42 mm diameter. Recommend annual imaging followup by CTA or MRA  Recent Labs: 11/26/2019: BUN 17; Creatinine, Ser 0.87; Hemoglobin 14.5; Platelets 258; Potassium 3.8; Sodium 133  No results found for requested labs within last 8760 hours.   CrCl cannot be calculated (Patient's most recent lab result is older than the maximum 21 days allowed.).   Wt Readings from Last 3 Encounters:  05/15/20 219 lb (99.3 kg)  12/03/19 216 lb 6 oz (98.1 kg)  11/26/19 216 lb 6 oz (98.1 kg)     Other studies reviewed: Additional studies/records reviewed today include: summarized above  ASSESSMENT AND PLAN:  1. Sick sinus, sinus node dysfunction w/PPM     *** Intact device function  2. Hx of vasgvagal syncope     *** No  reports of recurrent syncope, no near syncope  3. HTN     *** No changes, stable    Disposition: ***   Current medicines are reviewed at length with the patient today.  The patient did not have any concerns regarding medicines.  Haywood Lasso, PA-C 07/02/2020 3:23 PM     [place] [address] [phone] (office)  [phone] (fax)

## 2020-07-03 ENCOUNTER — Encounter: Payer: Medicare HMO | Admitting: Physician Assistant

## 2020-07-04 ENCOUNTER — Encounter: Payer: Medicare HMO | Admitting: Physician Assistant

## 2020-07-04 NOTE — Progress Notes (Signed)
Cardiology Office Note Date:  07/04/2020  Patient ID:  Tony Cruz, DOB 1934/06/15, MRN [id number] PCP:  Velna Hatchet, MD  Cardiologist:  [name] Pulmonology: Dr. Lenna Gilford    Chief Complaint: pre-colonoscopy?  History of Present Illness: Tony Cruz is a 86 y.o. male with history of sinus node dysfunction w/PPM, syncope thought to be vasovagal/post prandial (rate drop response was programmed on), HTN, OA, barrett's esophagus, DVT Dec 2017 and tx w/xarelto, was decreased to 74m though after repeat LE UKoreashowed persistent thrombus and mention of factor V Leiden was maintained on a/c.  He developed iron def anemia and +FOB, and underwent EGD/colonoscopy without cause for anemia found, and cleared to resume a/c.  He comes in today to be seen for Dr. KCaryl Comes last seen by him an 2021, he was playing golf 3-4 days/week.  Mentioned balance neuropathy. Noted significant edema, ? If amlodipine and was discontinued and resumed on chlorthalidone, discussed sodium reduction   TODAY He states he just wanted to check in and make sure his pacer and heart was ok prior to his EGD/colonoscopy. He states that they found blood in his stool recently and is getting them because of that.  He has not seen blood or noted a change. He gold 2-3 days a week and walks the course, up some hills as well and feels like he has great exertional capacity and stamina. No CP, no SOB, DOE He sleeps with his CPAP and denies any nocturnal symptoms He has known neuropathy and some balance/gait instability, though "funny enough, never on the gold course!" He has slight/fleeting dizziness upon standing, no near syncope or syncope. He notes his legs swollen  Device information: MDT dual chamber PPM, implanted 06/19/2007, Gen change 05/21/17 >>> Biotronik CLS device   Past Medical History:  Diagnosis Date  . Allergy   . Anal fissure   . Anemia   . Backache, unspecified   . Barrett's esophagus   .  Diverticulosis of colon (without mention of hemorrhage)   . DVT of leg (deep venous thrombosis) ([facility]   . Esophageal reflux   . Factor V Leiden deficiency ([facility]   . hypertension   . Hypertrophy of prostate with urinary obstruction and other lower urinary tract symptoms (LUTS)   . Irritable bowel syndrome   . Obstructive sleep apnea   . Osteoarthrosis, unspecified whether generalized or localized, unspecified site   . Other abnormal glucose   . Overweight(278.02)   . Pacemaker-MDT    Change out 2008  . Sleep apnea 2016   Uses cpap  . Syncope and collapse   . Tubular adenoma of colon   . Unspecified hereditary and idiopathic peripheral neuropathy     Past Surgical History:  Procedure Laterality Date  . APPENDECTOMY    . CHOLECYSTECTOMY    . PACEMAKER PLACEMENT    . PPM GENERATOR CHANGEOUT N/A 05/21/2017   Procedure: PPM GENERATOR CHANGEOUT;  Surgeon: KDeboraha Sprang MD;  [facility];  Service: Cardiovascular;  Laterality: N/A;  . skin cancer removed  12/2012   removed from his face--Dr. GSarajane Jews . TONSILLECTOMY    . TOTAL KNEE ARTHROPLASTY Right 12/03/2019   Procedure: TOTAL KNEE ARTHROPLASTY;  Surgeon: CSydnee Cabal MD;  [facility];  Service: Orthopedics;  Laterality: Right;  adductor canal    Current Outpatient Medications  Medication Sig Dispense Refill  . acetaminophen (TYLENOL) 500 MG tablet Take 500-1,000 mg by mouth every 6 (six) hours as needed for headache (  or pain).     Marland Kitchen ascorbic acid (VITAMIN C) 500 MG tablet Take 500 mg by mouth daily.     . calcium carbonate (OS-CAL) 600 MG TABS Take 600 mg by mouth daily.      . chlorthalidone (HYGROTON) 25 MG tablet Take 1 tablet (25 mg total) by mouth daily. 90 tablet 3  . cholecalciferol (VITAMIN D3) 25 MCG (1000 UNIT) tablet Take 1,000 Units by mouth daily.    . ferrous sulfate 325 (65 FE) MG tablet Take 325 mg by mouth 2 (two) times daily with a meal.     . finasteride (PROSCAR) 5 MG tablet Take 1  tablet by mouth daily.    . Lecithin 1200 MG CAPS Take 1 capsule by mouth daily.      Marland Kitchen loratadine (CLARITIN) 10 MG tablet Take 10 mg by mouth daily.      Marland Kitchen losartan (COZAAR) 100 MG tablet Take 1 tablet (100 mg total) by mouth daily. 90 tablet 3  . Multiple Vitamin (MULTIVITAMIN) capsule Take 1 capsule by mouth daily.      . naproxen (NAPROSYN) 500 MG tablet TAKE 1 TABLET TWICE A DAY 180 tablet 3  . Omega-3 Fatty Acids (FISH OIL) 1200 MG CAPS Take 1,200 mg by mouth daily.    . pantoprazole (PROTONIX) 40 MG tablet TAKE ONE TABLET DAILY. MAKEAN APPOINTMENT FOR FURTHER REFILLS (Patient taking differently: Take 40 mg by mouth daily. ) 90 tablet 3  . PEG-KCl-NaCl-NaSulf-Na Asc-C (PLENVU) 140 g SOLR Take 1 kit by mouth as directed. 1 each 0  . tamsulosin (FLOMAX) 0.4 MG CAPS capsule Take 0.4 mg by mouth daily.    . traMADol (ULTRAM) 50 MG tablet Take 50 mg by mouth 3 (three) times daily as needed.    Alveda Reasons 15 MG TABS tablet TAKE 1 TABLET DAILY WITH   SUPPER (Patient taking differently: Take 15 mg by mouth daily with supper. ) 90 tablet 0   Current Facility-Administered Medications  Medication Dose Route Frequency Provider Last Rate Last Admin  . 0.9 %  sodium chloride infusion  500 mL Intravenous Continuous Pyrtle, Lajuan Lines, MD        Allergies:   Shellfish allergy   Social History:  The patient  reports that he has never smoked. He has never used smokeless tobacco. He reports current alcohol use. He reports that he does not use drugs.   Family History:  The patient's family history includes Bladder Cancer in his father; Coronary artery disease in his father and mother; Diabetes in his sister; Prostate cancer in his father.  ROS:  Please see the history of present illness. All other systems are reviewed and otherwise negative.   PHYSICAL EXAM:  VS:  There were no vitals taken for this visit. BMI: There is no height or weight on file to calculate BMI. Well nourished, well developed, in no  acute distress  HEENT: normocephalic, atraumatic  Neck: no JVD, carotid bruits or masses Cardiac:  RRR; no significant murmurs, no rubs, or gallops Lungs:  CTA b/l, no wheezing, rhonchi or rales  Abd: soft, nontender, obese MS: no deformity or atrophy Ext: trace edema  Skin: warm and dry, no rash Neuro:  No gross deficits appreciated Psych: euthymic mood, full affect  PPM site is stable, no tethering or discomfort   EKG:  Done today and reviewed by myself A Pace V sensing, RBBB (not new from last EKG) 80bpm   PPM interrogation : done today and reviewed by myself:  Battery and lead measurements are good R waves 3.7, same as last in clinic check, sensitivity adjusted 2 HVR are NSVT, one back in march, and May 30% AP 1% VP  05/14/16: ETT  Blood pressure demonstrated a normal response to exercise.  There was no ST segment deviation noted during stress. Baseline EKG demonstrates RBBB with T wave inversions in V1 and V2.  No evidence of exercise induced ischemia at an adequate HR reaching 99% MPHR.  Moderately impaired exercise tolerance. The patient exercised to 6.4 mets.  This is a low risk study.  05/14/16: TTE Study Conclusions - Left ventricle: The cavity size was normal. Wall thickness was   normal. Systolic function was normal. The estimated ejection   fraction was in the range of 50% to 55%. Wall motion was normal;   there were no regional wall motion abnormalities. Doppler   parameters are consistent with abnormal left ventricular   relaxation (grade 1 diastolic dysfunction). - Aortic valve: There was mild regurgitation. - Ascending aorta: The ascending aorta was mildly dilated. - Left atrium: The atrium was mildly dilated. Impressions: - Normal LV systolic function; grade 1 diastolic dysfunction; mild   AI; mild LAE; ascending aorta measures 4.5 cm; suggest CTA or MRA   to further assess.  10/224/18: CT chest IMPRESSION: Aneurysmal ascending aorta measuring  up to 42 mm diameter. Recommend annual imaging followup by CTA or MRA  Recent Labs: 11/26/2019: BUN 17; Creatinine, Ser 0.87; Hemoglobin 14.5; Platelets 258; Potassium 3.8; Sodium 133  No results found for requested labs within last 8760 hours.   CrCl cannot be calculated (Patient's most recent lab result is older than the maximum 21 days allowed.).   Wt Readings from Last 3 Encounters:  05/15/20 219 lb (99.3 kg)  12/03/19 216 lb 6 oz (98.1 kg)  11/26/19 216 lb 6 oz (98.1 kg)     Other studies reviewed: Additional studies/records reviewed today include: summarized above  ASSESSMENT AND PLAN:  1. Sick sinus, sinus node dysfunction w/PPM     Intact device function  2. Hx of vasgvagal syncope     No reports of recurrent syncope, no near syncope  3. HTN     No changes, stable  4. Trace edema     No symptoms or suspicion of volume OL     Likely dependent edema perhaps 2/2 his neuropathy     [name] described significant edema, today is trace at most, he mentions he is on his feet most the day  5. Pre-EGD/colonoscopy        PMD has addressed his Xarelto     RCRI score os zero, has excellent exertional capacity     METS 8.97   Disposition: BMET and mag today, continue remotes Q49mo and in clinic in 1 year, sooner if needed   Current medicines are reviewed at length with the patient today.  The patient did not have any concerns regarding medicines.  Tony Lasso PA-C 07/04/2020 6:28 PM     [place] [phone](office)  [phone](fax)

## 2020-07-05 ENCOUNTER — Encounter: Payer: Self-pay | Admitting: Physician Assistant

## 2020-07-05 ENCOUNTER — Ambulatory Visit: Payer: Medicare HMO | Admitting: Physician Assistant

## 2020-07-05 ENCOUNTER — Other Ambulatory Visit: Payer: Self-pay

## 2020-07-05 VITALS — BP 116/84 | HR 80 | Ht 67.5 in | Wt 221.2 lb

## 2020-07-05 DIAGNOSIS — Z79899 Other long term (current) drug therapy: Secondary | ICD-10-CM

## 2020-07-05 DIAGNOSIS — I10 Essential (primary) hypertension: Secondary | ICD-10-CM

## 2020-07-05 DIAGNOSIS — Z950 Presence of cardiac pacemaker: Secondary | ICD-10-CM

## 2020-07-05 DIAGNOSIS — R609 Edema, unspecified: Secondary | ICD-10-CM | POA: Diagnosis not present

## 2020-07-05 MED ORDER — CHLORTHALIDONE 25 MG PO TABS: 25.0000 mg | ORAL_TABLET | Freq: Every day | ORAL | 3 refills | Status: DC

## 2020-07-05 NOTE — Patient Instructions (Addendum)
Medication Instructions:    Your physician recommends that you continue on your current medications as directed. Please refer to the Current Medication list given to you today.  *If you need a refill on your cardiac medications before your next appointment, please call your pharmacy*   Lab Work:  BMET  AND [place]   If you have labs (blood work) drawn today and your tests are completely normal, you will receive your results only by: Marland Kitchen MyChart Message (if you have MyChart) OR . A paper copy in the mail If you have any lab test that is abnormal or we need to change your treatment, we will call you to review the results.   Testing/Procedures: NONE ORDERED  TODAY   Follow-Up: At [facility], you and your health needs are our priority.  As part of our continuing mission to provide you with exceptional heart care, we have created designated Provider Care Teams.  These Care Teams include your primary Cardiologist (physician) and Advanced Practice Providers (APPs -  Physician Assistants and Nurse Practitioners) who all work together to provide you with the care you need, when you need it.  We recommend signing up for the patient portal called "MyChart".  Sign up information is provided on this After Visit Summary.  MyChart is used to connect with patients for Virtual Visits (Telemedicine).  Patients are able to view lab/test results, encounter notes, upcoming appointments, etc.  Non-urgent messages can be sent to your provider as well.   To learn more about what you can do with MyChart, go to [email or web address].    Your next appointment:   1 year(s)  The format for your next appointment:   In Person  Provider:   You may see Virl Axe, MD or one of the following Advanced Practice Providers on your designated Care Team:    Chanetta Marshall, NP  Tommye Standard, [place]  Legrand Como "Oda Kilts, [place]    Other Instructions

## 2020-07-06 LAB — SPECIMEN STATUS

## 2020-07-06 LAB — BASIC METABOLIC PANEL
BUN/Creatinine Ratio: 15 (ref 10–24)
BUN: 15 mg/dL (ref 8–27)
CO2: 27 mmol/L (ref 20–29)
Calcium: 9 mg/dL (ref 8.6–10.2)
Chloride: 95 mmol/L — ABNORMAL LOW (ref 96–106)
Creatinine, Ser: 0.97 mg/dL (ref 0.76–1.27)
GFR calc Af Amer: 83 mL/min/{1.73_m2} (ref >59)
GFR calc non Af Amer: 71 mL/min/{1.73_m2} (ref >59)
Glucose: 107 mg/dL — ABNORMAL HIGH (ref 65–99)
Potassium: 4.1 mmol/L (ref 3.5–5.2)
Sodium: 134 mmol/L (ref 134–144)

## 2020-07-06 LAB — MAGNESIUM: Magnesium: 2.1 mg/dL (ref 1.6–2.3)

## 2020-07-10 ENCOUNTER — Other Ambulatory Visit: Payer: Self-pay

## 2020-07-10 ENCOUNTER — Ambulatory Visit (AMBULATORY_SURGERY_CENTER): Payer: Medicare HMO | Admitting: Internal Medicine

## 2020-07-10 ENCOUNTER — Encounter: Payer: Self-pay | Admitting: Internal Medicine

## 2020-07-10 VITALS — BP 106/61 | HR 71 | Temp 97.7°F | Resp 11 | Ht 67.5 in | Wt 219.0 lb

## 2020-07-10 DIAGNOSIS — K2970 Gastritis, unspecified, without bleeding: Secondary | ICD-10-CM

## 2020-07-10 DIAGNOSIS — K319 Disease of stomach and duodenum, unspecified: Secondary | ICD-10-CM

## 2020-07-10 DIAGNOSIS — K317 Polyp of stomach and duodenum: Secondary | ICD-10-CM | POA: Diagnosis not present

## 2020-07-10 DIAGNOSIS — K259 Gastric ulcer, unspecified as acute or chronic, without hemorrhage or perforation: Secondary | ICD-10-CM

## 2020-07-10 DIAGNOSIS — K2990 Gastroduodenitis, unspecified, without bleeding: Secondary | ICD-10-CM

## 2020-07-10 DIAGNOSIS — R195 Other fecal abnormalities: Secondary | ICD-10-CM

## 2020-07-10 DIAGNOSIS — K649 Unspecified hemorrhoids: Secondary | ICD-10-CM | POA: Diagnosis not present

## 2020-07-10 DIAGNOSIS — Z86010 Personal history of colonic polyps: Secondary | ICD-10-CM

## 2020-07-10 DIAGNOSIS — K5730 Diverticulosis of large intestine without perforation or abscess without bleeding: Secondary | ICD-10-CM

## 2020-07-10 MED ORDER — SODIUM CHLORIDE 0.9 % IV SOLN: 500.0000 mL | Freq: Once | INTRAVENOUS | Status: DC

## 2020-07-10 NOTE — Progress Notes (Signed)
Medical history reviewed with no changes noted. VS assessed by J.D 

## 2020-07-10 NOTE — Patient Instructions (Signed)
Please read handouts provided. Continue present medications. Await pathology results. Can resume Xarelto with tonight's dose.      YOU HAD AN ENDOSCOPIC PROCEDURE TODAY AT [facility]:   Refer to the procedure report that was given to you for any specific questions about what was found during the examination.  If the procedure report does not answer your questions, please call your gastroenterologist to clarify.  If you requested that your care partner not be given the details of your procedure findings, then the procedure report has been included in a sealed envelope for you to review at your convenience later.  YOU SHOULD EXPECT: Some feelings of bloating in the abdomen. Passage of more gas than usual.  Walking can help get rid of the air that was put into your GI tract during the procedure and reduce the bloating. If you had a lower endoscopy (such as a colonoscopy or flexible sigmoidoscopy) you may notice spotting of blood in your stool or on the toilet paper. If you underwent a bowel prep for your procedure, you may not have a normal bowel movement for a few days.  Please Note:  You might notice some irritation and congestion in your nose or some drainage.  This is from the oxygen used during your procedure.  There is no need for concern and it should clear up in a day or so.  SYMPTOMS TO REPORT IMMEDIATELY:   Following lower endoscopy (colonoscopy or flexible sigmoidoscopy):  Excessive amounts of blood in the stool  Significant tenderness or worsening of abdominal pains  Swelling of the abdomen that is new, acute  Fever of 100F or higher   Following upper endoscopy (EGD)  Vomiting of blood or coffee ground material  New chest pain or pain under the shoulder blades  Painful or persistently difficult swallowing  New shortness of breath  Fever of 100F or higher  Black, tarry-looking stools  For urgent or emergent issues, a gastroenterologist can be reached at  any hour by calling [phone]. Do not use MyChart messaging for urgent concerns.    DIET:  We do recommend a small meal at first, but then you may proceed to your regular diet.  Drink plenty of fluids but you should avoid alcoholic beverages for 24 hours.  ACTIVITY:  You should plan to take it easy for the rest of today and you should NOT DRIVE or use heavy machinery until tomorrow (because of the sedation medicines used during the test).    FOLLOW UP: Our staff will call the number listed on your records 48-72 hours following your procedure to check on you and address any questions or concerns that you may have regarding the information given to you following your procedure. If we do not reach you, we will leave a message.  We will attempt to reach you two times.  During this call, we will ask if you have developed any symptoms of COVID 19. If you develop any symptoms (ie: fever, flu-like symptoms, shortness of breath, cough etc.) before then, please call [phone].  If you test positive for Covid 19 in the 2 weeks post procedure, please call and report this information to [place].    If any biopsies were taken you will be contacted by phone or by letter within the next 1-3 weeks.  Please call us at [phone] if you have not heard about the biopsies in 3 weeks.    SIGNATURES/CONFIDENTIALITY: You and/or your care partner have signed paperwork which  will be entered into your electronic medical record.  These signatures attest to the fact that that the information above on your After Visit Summary has been reviewed and is understood.  Full responsibility of the confidentiality of this discharge information lies with you and/or your care-partner. °

## 2020-07-10 NOTE — Progress Notes (Signed)
Called to room to assist during endoscopic procedure.  Patient ID and intended procedure confirmed with present staff. Received instructions for my participation in the procedure from the performing physician.  

## 2020-07-10 NOTE — Progress Notes (Signed)
pt tolerated well. VSS. awake and to recovery. Report given to RN.  

## 2020-07-10 NOTE — Op Note (Addendum)
[facility] Patient Name: Tony Cruz Procedure Date: 07/10/2020 1:36 PM MRN: [id number] Endoscopist: Jerene Bears , MD Age: 86 Referring MD:  Date of Birth: September 24, 1933 Gender: Male Account #: [id number] Procedure:                Colonoscopy Indications:              Heme positive stool, history of adenomatous polyps                            in the colon, last colonoscopy Sept 2018 Medicines:                Monitored Anesthesia Care Procedure:                Pre-Anesthesia Assessment:                           - Prior to the procedure, a History and Physical                            was performed, and patient medications and                            allergies were reviewed. The patient's tolerance of                            previous anesthesia was also reviewed. The risks                            and benefits of the procedure and the sedation                            options and risks were discussed with the patient.                            All questions were answered, and informed consent                            was obtained. Prior Anticoagulants: The patient has                            taken Xarelto (rivaroxaban), last dose was 2 days                            prior to procedure. ASA Grade Assessment: III - A                            patient with severe systemic disease. After                            reviewing the risks and benefits, the patient was                            deemed in satisfactory condition to undergo the  procedure.                           After obtaining informed consent, the colonoscope                            was passed under direct vision. Throughout the                            procedure, the patient's blood pressure, pulse, and                            oxygen saturations were monitored continuously. The                            Colonoscope was introduced through the anus and                             advanced to the terminal ileum. The colonoscopy was                            performed without difficulty. The patient tolerated                            the procedure well. The quality of the bowel                            preparation was good. The terminal ileum, ileocecal                            valve, appendiceal orifice, and rectum were                            photographed. Scope In: 1:54:57 PM Scope Out: 2:07:04 PM Scope Withdrawal Time: 0 hours 9 minutes 7 seconds  Total Procedure Duration: 0 hours 12 minutes 7 seconds  Findings:                 The digital rectal exam was normal.                           The terminal ileum appeared normal.                           Multiple small and large-mouthed diverticula were                            found from ascending colon to sigmoid colon.                           Internal hemorrhoids were found during                            retroflexion. The hemorrhoids were small.  The exam was otherwise without abnormality. Complications:            No immediate complications. Estimated Blood Loss:     Estimated blood loss: none. Impression:               - The examined portion of the ileum was normal.                           - Diverticulosis from ascending colon to sigmoid                            colon.                           - Internal hemorrhoids.                           - The examination was otherwise normal. No polyps                            found.                           - No specimens collected. Recommendation:           - Patient has a contact number available for                            emergencies. The signs and symptoms of potential                            delayed complications were discussed with the                            patient. Return to normal activities tomorrow.                            Written discharge instructions were provided to the                             patient.                           - Resume previous diet.                           - Continue present medications. Can resume Xarelto                            with tonight's dose.                           - Given the lack of anemia I would not proceed to                            video capsule endoscopy at this time. However, if  overt GI bleeding or anemia despite oral iron, then                            video capsule endoscopy would be the remaining GI                            test of possible diagnostic value. Hgb just before                            visit in Oct was normal at 15 g/dL.                           - No repeat colonoscopy due to age and the absence                            of colonic polyps. Jerene Bears, MD 07/10/2020 2:19:01 PM This report has been signed electronically.

## 2020-07-10 NOTE — Progress Notes (Deleted)
VS taken by JD 

## 2020-07-10 NOTE — Op Note (Signed)
[facility] Patient Name: Tony Cruz Procedure Date: 07/10/2020 1:37 PM MRN: [id number] Endoscopist: Jerene Bears , MD Age: 86 Referring MD:  Date of Birth: [birth year] Gender: Male Account #: [id number] Procedure:                Upper GI endoscopy Indications:              Heme positive stool Medicines:                Monitored Anesthesia Care Procedure:                Pre-Anesthesia Assessment:                           - Prior to the procedure, a History and Physical                            was performed, and patient medications and                            allergies were reviewed. The patient's tolerance of                            previous anesthesia was also reviewed. The risks                            and benefits of the procedure and the sedation                            options and risks were discussed with the patient.                            All questions were answered, and informed consent                            was obtained. Prior Anticoagulants: The patient has                            taken Xarelto (rivaroxaban), last dose was 2 days                            prior to procedure. ASA Grade Assessment: III - A                            patient with severe systemic disease. After                            reviewing the risks and benefits, the patient was                            deemed in satisfactory condition to undergo the                            procedure.  After obtaining informed consent, the endoscope was                            passed under direct vision. Throughout the                            procedure, the patient's blood pressure, pulse, and                            oxygen saturations were monitored continuously. The                            Endoscope was introduced through the mouth, and                            advanced to the third part of duodenum. The upper                             GI endoscopy was accomplished without difficulty.                            The patient tolerated the procedure well. Scope In: Scope Out: Findings:                 Normal mucosa was found in the entire esophagus.                           A 1 cm hiatal hernia was present.                           Mild to moderate inflammation characterized by                            congestion (edema) and erythema was found in the                            gastric body and in the gastric antrum. No adherent                            heme or evidence of recent bleeding. Biopsies were                            taken with a cold forceps for histology and                            Helicobacter pylori testing.                           A few small sessile polyps with no stigmata of                            recent bleeding were found in the gastric fundus  and in the gastric body. These are benign in                            appearance and appear like fundic gland polyps (all                            < 10 mm).                           The examined duodenum was normal. Complications:            No immediate complications. Estimated Blood Loss:     Estimated blood loss was minimal. Impression:               - Normal mucosa was found in the entire esophagus.                           - 1 cm hiatal hernia.                           - Gastritis. Biopsied.                           - A few gastric polyps. Benign and not bleeding.                           - Normal examined duodenum. Recommendation:           - Patient has a contact number available for                            emergencies. The signs and symptoms of potential                            delayed complications were discussed with the                            patient. Return to normal activities tomorrow.                            Written discharge instructions were provided to the                             patient.                           - Resume previous diet.                           - Continue present medications.                           - Await pathology results.                           - See the other procedure note for documentation of  additional recommendations. Jerene Bears, MD 07/10/2020 2:14:14 PM This report has been signed electronically.

## 2020-07-12 ENCOUNTER — Telehealth: Payer: Self-pay | Admitting: *Deleted

## 2020-07-12 NOTE — Telephone Encounter (Signed)
First attempt, left VM.  

## 2020-07-12 NOTE — Telephone Encounter (Signed)
Second attempt, left VM.  

## 2020-07-13 ENCOUNTER — Encounter: Payer: Self-pay | Admitting: Sports Medicine

## 2020-07-13 ENCOUNTER — Ambulatory Visit: Payer: Medicare HMO | Admitting: Sports Medicine

## 2020-07-13 ENCOUNTER — Other Ambulatory Visit: Payer: Self-pay

## 2020-07-13 DIAGNOSIS — M2042 Other hammer toe(s) (acquired), left foot: Secondary | ICD-10-CM

## 2020-07-13 DIAGNOSIS — M2041 Other hammer toe(s) (acquired), right foot: Secondary | ICD-10-CM

## 2020-07-13 DIAGNOSIS — B351 Tinea unguium: Secondary | ICD-10-CM

## 2020-07-13 DIAGNOSIS — M79675 Pain in left toe(s): Secondary | ICD-10-CM

## 2020-07-13 DIAGNOSIS — D689 Coagulation defect, unspecified: Secondary | ICD-10-CM | POA: Diagnosis not present

## 2020-07-13 DIAGNOSIS — M79674 Pain in right toe(s): Secondary | ICD-10-CM | POA: Diagnosis not present

## 2020-07-13 DIAGNOSIS — G629 Polyneuropathy, unspecified: Secondary | ICD-10-CM

## 2020-07-13 DIAGNOSIS — L570 Actinic keratosis: Secondary | ICD-10-CM

## 2020-07-13 NOTE — Progress Notes (Signed)
Subjective: Tony Cruz is a 86 y.o. male patient seen today in office with complaint of mildly painful thickened and elongated toenails; unable to trim. Denies any changes with meds, still on Xarelto like before without any changes. No other issues.   Patient Active Problem List   Diagnosis Date Noted  . Osteoarthritis of right knee 12/03/2019  . Pain in right knee 12/30/2017  . Anemia, iron deficiency 12/11/2017  . Toe ulcer ([place]) 01/13/2017  . Heterozygous factor V Leiden mutation ([place]) 01/13/2017  . DVT (deep venous thrombosis) ([place]) 08/22/2016  . History of skull fracture 10/19/2015  . History of subdural hematoma 10/19/2015  . Headache(784.0) 09/30/2013  . Sinoatrial node dysfunction ([facility]) 06/01/2013  . Pacemaker-MDT 07/02/2011  . Night sweats 07/02/2011  . DIZZINESS 03/22/2010  . Impaired fasting glucose 11/02/2009  . Obstructive sleep apnea 11/21/2008  . Backache 11/03/2008  . Overweight 11/02/2007  . Essential hypertension 11/02/2007  . ALLERGIC RHINITIS 11/02/2007  . GASTROESOPHAGEAL REFLUX DISEASE 11/02/2007  . Diverticulosis of large intestine 11/02/2007  . Benign prostatic hyperplasia with urinary obstruction 11/02/2007  . SYNCOPE 11/02/2007  . Hereditary and idiopathic peripheral neuropathy 10/30/2007  . IRRITABLE BOWEL SYNDROME 10/30/2007  . Osteoarthritis 10/30/2007    Current Outpatient Medications on File Prior to Visit  Medication Sig Dispense Refill  . acetaminophen (TYLENOL) 500 MG tablet Take 500-1,000 mg by mouth every 6 (six) hours as needed for headache (or pain).     Marland Kitchen ascorbic acid (VITAMIN C) 500 MG tablet Take 500 mg by mouth daily.     . calcium carbonate (OS-CAL) 600 MG TABS Take 600 mg by mouth daily.    . chlorthalidone (HYGROTON) 25 MG tablet Take 1 tablet (25 mg total) by mouth daily. 90 tablet 3  . cholecalciferol (VITAMIN D3) 25 MCG (1000 UNIT) tablet Take 1,000 Units by mouth daily.    . ferrous sulfate 325 (65 FE) MG tablet Take  325 mg by mouth 2 (two) times daily with a meal.     . finasteride (PROSCAR) 5 MG tablet Take 1 tablet by mouth daily.    Marland Kitchen gabapentin (NEURONTIN) 100 MG capsule     . Lecithin 1200 MG CAPS Take 1 capsule by mouth daily.    Marland Kitchen loratadine (CLARITIN) 10 MG tablet Take 10 mg by mouth daily.    Marland Kitchen losartan (COZAAR) 100 MG tablet Take 1 tablet (100 mg total) by mouth daily. 90 tablet 3  . Multiple Vitamin (MULTIVITAMIN) capsule Take 1 capsule by mouth daily.    . naproxen (NAPROSYN) 500 MG tablet TAKE 1 TABLET TWICE A DAY 180 tablet 3  . Omega-3 Fatty Acids (FISH OIL) 1200 MG CAPS Take 1,200 mg by mouth daily.    . pantoprazole (PROTONIX) 40 MG tablet TAKE ONE TABLET DAILY. MAKEAN APPOINTMENT FOR FURTHER REFILLS (Patient taking differently: Take 40 mg by mouth daily.) 90 tablet 3  . tamsulosin (FLOMAX) 0.4 MG CAPS capsule Take 0.4 mg by mouth daily.    . traMADol (ULTRAM) 50 MG tablet Take 50 mg by mouth 3 (three) times daily as needed.    Alveda Reasons 15 MG TABS tablet TAKE 1 TABLET DAILY WITH   SUPPER (Patient taking differently: Take 15 mg by mouth daily with supper.) 90 tablet 0   Current Facility-Administered Medications on File Prior to Visit  Medication Dose Route Frequency Provider Last Rate Last Admin  . 0.9 %  sodium chloride infusion  500 mL Intravenous Continuous Pyrtle, Lajuan Lines, MD  Allergies  Allergen Reactions  . Shellfish Allergy Rash    Sensitive to shellfish    Objective: Physical Exam  General: Well developed, nourished, no acute distress, awake, alert and oriented x 3  Vascular: Dorsalis pedis artery 1/4 bilateral, Posterior tibial artery 0/4 bilateral, skin temperature warm to warm proximal to distal bilateral lower extremities due to trace edema, + varicosities, pedal hair present bilateral.  Neurological: Gross sensation present via light touch bilateral.   Dermatological: Skin is warm, dry, and supple bilateral, Nails 1-10 are tender, long, thick, and discolored  with mild subungal debris, no webspace macerations present bilateral, no open lesions present bilateral,minimal keratosis to left 2nd toe. No signs of infection bilateral.  Musculoskeletal:  Asymptomatic claw/hammertoes boney deformities noted bilateral. Muscular strength within normal limits without painon range of motion. No pain with calf compression bilateral.  Assessment and Plan:  Problem List Items Addressed This Visit   None   Visit Diagnoses    Pain due to onychomycosis of toenails of both feet    -  Primary   Neuropathy       Coagulation defect (HCC)       Hammer toes of both feet       Keratosis         -Examined patient.  -Re-Discussed with patient importance of daily foot inspection in the setting of neuropathy -Mechanically debrided and reduced mycotic nails with sterile nail nipper and dremel nail file without incident. -Dispensed spacer for left 2nd toe and at no charge debrided keratosis at left 2nd toe using a 15 blade without incident -Recommend good supportive shoes daily for foot type like previous and spacer as above -Patient to return in 3 months for follow up evaluation or sooner if symptoms worsen.  Landis Martins, DPM

## 2020-07-14 ENCOUNTER — Encounter: Payer: Self-pay | Admitting: Internal Medicine

## 2020-09-05 DIAGNOSIS — R339 Retention of urine, unspecified: Secondary | ICD-10-CM | POA: Diagnosis not present

## 2020-09-05 DIAGNOSIS — N138 Other obstructive and reflux uropathy: Secondary | ICD-10-CM | POA: Diagnosis not present

## 2020-09-05 DIAGNOSIS — N401 Enlarged prostate with lower urinary tract symptoms: Secondary | ICD-10-CM | POA: Diagnosis not present

## 2020-10-12 ENCOUNTER — Encounter: Payer: Self-pay | Admitting: Sports Medicine

## 2020-10-12 ENCOUNTER — Other Ambulatory Visit: Payer: Self-pay

## 2020-10-12 ENCOUNTER — Ambulatory Visit: Payer: Medicare HMO | Admitting: Sports Medicine

## 2020-10-12 DIAGNOSIS — L570 Actinic keratosis: Secondary | ICD-10-CM

## 2020-10-12 DIAGNOSIS — B351 Tinea unguium: Secondary | ICD-10-CM | POA: Diagnosis not present

## 2020-10-12 DIAGNOSIS — G629 Polyneuropathy, unspecified: Secondary | ICD-10-CM | POA: Diagnosis not present

## 2020-10-12 DIAGNOSIS — D689 Coagulation defect, unspecified: Secondary | ICD-10-CM | POA: Diagnosis not present

## 2020-10-12 DIAGNOSIS — M79674 Pain in right toe(s): Secondary | ICD-10-CM | POA: Diagnosis not present

## 2020-10-12 DIAGNOSIS — M79675 Pain in left toe(s): Secondary | ICD-10-CM

## 2020-10-12 DIAGNOSIS — L608 Other nail disorders: Secondary | ICD-10-CM

## 2020-10-12 DIAGNOSIS — M2042 Other hammer toe(s) (acquired), left foot: Secondary | ICD-10-CM

## 2020-10-12 DIAGNOSIS — M2041 Other hammer toe(s) (acquired), right foot: Secondary | ICD-10-CM

## 2020-10-12 NOTE — Progress Notes (Signed)
Subjective: Tony Cruz is a 87 y.o. male patient seen today in office with complaint of mildly painful thickened and elongated toenails; unable to trim. Denies any changes with meds, still on Xarelto like before without any changes. Except increase in Gabapentin, not sure if its helping. No other issues.   Patient Active Problem List   Diagnosis Date Noted  . Osteoarthritis of right knee 12/03/2019  . Pain in right knee 12/30/2017  . Anemia, iron deficiency 12/11/2017  . Toe ulcer ([facility]) 01/13/2017  . Heterozygous factor V Leiden mutation ([facility]) 01/13/2017  . DVT (deep venous thrombosis) ([facility]) 08/22/2016  . History of skull fracture 10/19/2015  . History of subdural hematoma 10/19/2015  . Headache(784.0) 09/30/2013  . Sinoatrial node dysfunction ([facility]) 06/01/2013  . Pacemaker-MDT 07/02/2011  . Night sweats 07/02/2011  . DIZZINESS 03/22/2010  . Impaired fasting glucose 11/02/2009  . Obstructive sleep apnea 11/21/2008  . Backache 11/03/2008  . Overweight 11/02/2007  . Essential hypertension 11/02/2007  . ALLERGIC RHINITIS 11/02/2007  . GASTROESOPHAGEAL REFLUX DISEASE 11/02/2007  . Diverticulosis of large intestine 11/02/2007  . Benign prostatic hyperplasia with urinary obstruction 11/02/2007  . SYNCOPE 11/02/2007  . Hereditary and idiopathic peripheral neuropathy 10/30/2007  . IRRITABLE BOWEL SYNDROME 10/30/2007  . Osteoarthritis 10/30/2007    Current Outpatient Medications on File Prior to Visit  Medication Sig Dispense Refill  . acetaminophen (TYLENOL) 500 MG tablet Take 500-1,000 mg by mouth every 6 (six) hours as needed for headache (or pain).     Marland Kitchen ascorbic acid (VITAMIN C) 500 MG tablet Take 500 mg by mouth daily.     . calcium carbonate (OS-CAL) 600 MG TABS Take 600 mg by mouth daily.    . chlorthalidone (HYGROTON) 25 MG tablet Take 1 tablet (25 mg total) by mouth daily. 90 tablet 3  . cholecalciferol (VITAMIN D3) 25 MCG (1000 UNIT) tablet Take 1,000 Units by mouth  daily.    . ferrous sulfate 325 (65 FE) MG tablet Take 325 mg by mouth 2 (two) times daily with a meal.     . finasteride (PROSCAR) 5 MG tablet Take 1 tablet by mouth daily.    Marland Kitchen gabapentin (NEURONTIN) 100 MG capsule     . Lecithin 1200 MG CAPS Take 1 capsule by mouth daily.    Marland Kitchen loratadine (CLARITIN) 10 MG tablet Take 10 mg by mouth daily.    Marland Kitchen losartan (COZAAR) 100 MG tablet Take 1 tablet (100 mg total) by mouth daily. 90 tablet 3  . Multiple Vitamin (MULTIVITAMIN) capsule Take 1 capsule by mouth daily.    . naproxen (NAPROSYN) 500 MG tablet TAKE 1 TABLET TWICE A DAY 180 tablet 3  . Omega-3 Fatty Acids (FISH OIL) 1200 MG CAPS Take 1,200 mg by mouth daily.    . pantoprazole (PROTONIX) 40 MG tablet TAKE ONE TABLET DAILY. MAKEAN APPOINTMENT FOR FURTHER REFILLS (Patient taking differently: Take 40 mg by mouth daily.) 90 tablet 3  . tamsulosin (FLOMAX) 0.4 MG CAPS capsule Take 0.4 mg by mouth daily.    . traMADol (ULTRAM) 50 MG tablet Take 50 mg by mouth 3 (three) times daily as needed.    Alveda Reasons 15 MG TABS tablet TAKE 1 TABLET DAILY WITH   SUPPER (Patient taking differently: Take 15 mg by mouth daily with supper.) 90 tablet 0   Current Facility-Administered Medications on File Prior to Visit  Medication Dose Route Frequency Provider Last Rate Last Admin  . 0.9 %  sodium chloride infusion  500 mL Intravenous  Continuous Pyrtle, Lajuan Lines, MD        Allergies  Allergen Reactions  . Shellfish Allergy Rash    Sensitive to shellfish    Objective: Physical Exam  General: Well developed, nourished, no acute distress, awake, alert and oriented x 3  Vascular: Dorsalis pedis artery 1/4 bilateral, Posterior tibial artery 0/4 bilateral, skin temperature warm to warm proximal to distal bilateral lower extremities due to trace edema, + varicosities, pedal hair present bilateral.  Neurological: Gross sensation present via light touch bilateral.   Dermatological: Skin is warm, dry, and supple  bilateral, Nails 1-10 are tender, long, thick, and discolored with mild subungal debris, dry blood at left 4th toe with no signs of infection, patient self inflicted, snagged nail this morning, no webspace macerations present bilateral, no open lesions present bilateral, very minimal keratosis to left 2nd toe. No signs of infection bilateral.  Musculoskeletal:  Asymptomatic claw/hammertoes boney deformities noted bilateral. Muscular strength within normal limits without painon range of motion. No pain with calf compression bilateral.  Assessment and Plan:  Problem List Items Addressed This Visit   None   Visit Diagnoses    Pain due to onychomycosis of toenails of both feet    -  Primary   Keratosis       Neuropathy       Coagulation defect (HCC)       Hammer toes of both feet       Hemorrhage of nail          -Examined patient.  -Re-Discussed with patient importance of daily foot inspection in the setting of neuropathy -Mechanically debrided and reduced mycotic nails with sterile nail nipper and dremel nail file without incident. -Dispensed to cap for left 4th toe -Recommend monitoring of left 4th toenail, if worsens return to office -Patient to return in 3 months for follow up evaluation or sooner if symptoms worsen.  Landis Martins, DPM

## 2020-11-08 ENCOUNTER — Ambulatory Visit: Payer: Medicare HMO | Admitting: Student

## 2020-11-08 ENCOUNTER — Encounter: Payer: Self-pay | Admitting: Student

## 2020-11-08 ENCOUNTER — Other Ambulatory Visit: Payer: Self-pay

## 2020-11-08 VITALS — BP 112/70 | HR 81 | Ht 68.0 in | Wt 222.0 lb

## 2020-11-08 DIAGNOSIS — I10 Essential (primary) hypertension: Secondary | ICD-10-CM | POA: Diagnosis not present

## 2020-11-08 DIAGNOSIS — G4733 Obstructive sleep apnea (adult) (pediatric): Secondary | ICD-10-CM | POA: Diagnosis not present

## 2020-11-08 DIAGNOSIS — I495 Sick sinus syndrome: Secondary | ICD-10-CM

## 2020-11-08 DIAGNOSIS — Z950 Presence of cardiac pacemaker: Secondary | ICD-10-CM

## 2020-11-08 DIAGNOSIS — R55 Syncope and collapse: Secondary | ICD-10-CM | POA: Diagnosis not present

## 2020-11-08 LAB — CUP PACEART INCLINIC DEVICE CHECK
Date Time Interrogation Session: 20220413140053
Implantable Lead Implant Date: 19990330
Implantable Lead Implant Date: 19990330
Implantable Lead Location: 753859
Implantable Lead Location: 753860
Implantable Lead Model: 5068
Implantable Lead Model: 5092
Implantable Pulse Generator Implant Date: 20181024
Lead Channel Impedance Value: 370 Ohm
Lead Channel Impedance Value: 546 Ohm
Lead Channel Pacing Threshold Amplitude: 0.4 V
Lead Channel Pacing Threshold Amplitude: 0.4 V
Lead Channel Pacing Threshold Amplitude: 0.9 V
Lead Channel Pacing Threshold Amplitude: 1 V
Lead Channel Pacing Threshold Amplitude: 1 V
Lead Channel Pacing Threshold Amplitude: 1 V
Lead Channel Pacing Threshold Pulse Width: 0.4 ms
Lead Channel Pacing Threshold Pulse Width: 0.4 ms
Lead Channel Pacing Threshold Pulse Width: 0.4 ms
Lead Channel Pacing Threshold Pulse Width: 0.4 ms
Lead Channel Pacing Threshold Pulse Width: 0.4 ms
Lead Channel Pacing Threshold Pulse Width: 0.4 ms
Lead Channel Sensing Intrinsic Amplitude: 1.5 mV
Lead Channel Sensing Intrinsic Amplitude: 1.7 mV
Lead Channel Sensing Intrinsic Amplitude: 3.9 mV
Lead Channel Sensing Intrinsic Amplitude: 4.4 mV
Lead Channel Setting Pacing Amplitude: 2 V
Lead Channel Setting Pacing Amplitude: 2.4 V
Lead Channel Setting Pacing Pulse Width: 0.4 ms
Lead Channel Setting Sensing Sensitivity: 1.5 mV
Pulse Gen Model: 407145
Pulse Gen Serial Number: 69203177

## 2020-11-08 NOTE — Patient Instructions (Signed)
Medication Instructions:  Your physician recommends that you continue on your current medications as directed. Please refer to the Current Medication list given to you today.  *If you need a refill on your cardiac medications before your next appointment, please call your pharmacy*   Lab Work: TODAY: BMET, CBC, TSH  If you have labs (blood work) drawn today and your tests are completely normal, you will receive your results only by: Marland Kitchen MyChart Message (if you have MyChart) OR . A paper copy in the mail If you have any lab test that is abnormal or we need to change your treatment, we will call you to review the results.   Testing/Procedures: Your physician has requested that you have an echocardiogram. Echocardiography is a painless test that uses sound waves to create images of your heart. It provides your doctor with information about the size and shape of your heart and how well your heart's chambers and valves are working. This procedure takes approximately one hour. There are no restrictions for this procedure.  Follow-Up: At [facility], you and your health needs are our priority.  As part of our continuing mission to provide you with exceptional heart care, we have created designated Provider Care Teams.  These Care Teams include your primary Cardiologist (physician) and Advanced Practice Providers (APPs -  Physician Assistants and Nurse Practitioners) who all work together to provide you with the care you need, when you need it.  Your next appointment:   1 year(s)  The format for your next appointment:   In Person  Provider:   You may see Virl Axe, MD or one of the following Advanced Practice Providers on your designated Care Team:    Chanetta Marshall, NP  Tommye Standard, PA-C  Legrand Como "Pinch" [place], [place]

## 2020-11-08 NOTE — Progress Notes (Signed)
Electrophysiology Office Note Date: 11/08/2020  ID:  Tony Cruz, DOB 01/29/1934, MRN [id number]  PCP: Velna Hatchet, MD Primary Cardiologist: Virl Axe, MD Electrophysiologist: Virl Axe, MD   CC: Pacemaker follow-up  Tony Cruz is a 87 y.o. male seen today for Virl Axe, MD for routine electrophysiology followup.  Since last being seen in our clinic the patient reports left shoulder discomfort for approximately 1 month. No clear aggravating or relieving factors. It is constant, including currently. He rates it 1-3/10 pain. No clear relation to exertion. Denies DOE, though feels like he has had decreased exercise tolerance. Though he also tells me he played 27 holes of PAR 3 golf in 1 day in the last month since these symptoms have started. Feels like at times he has chest pressure after eating associated with belching and gas, without relief.   Device History: MDT dual chamber PPM, implanted 06/19/2007, Gen change 05/21/17 >>> Biotronik CLS device  Past Medical History:  Diagnosis Date  . Allergy   . Anal fissure   . Anemia   . Backache, unspecified   . Barrett's esophagus   . Diverticulosis of colon (without mention of hemorrhage)   . DVT of leg (deep venous thrombosis) ([place])   . Esophageal reflux   . Factor V Leiden deficiency ([place])   . hypertension   . Hypertrophy of prostate with urinary obstruction and other lower urinary tract symptoms (LUTS)   . Irritable bowel syndrome   . Obstructive sleep apnea   . Osteoarthrosis, unspecified whether generalized or localized, unspecified site   . Other abnormal glucose   . Overweight(278.02)   . Pacemaker-MDT    Change out 2008  . Sleep apnea 2016   Uses cpap  . Syncope and collapse   . Tubular adenoma of colon   . Unspecified hereditary and idiopathic peripheral neuropathy    Past Surgical History:  Procedure Laterality Date  . APPENDECTOMY    . CHOLECYSTECTOMY    . PACEMAKER PLACEMENT    .  PPM GENERATOR CHANGEOUT N/A 05/21/2017   Procedure: PPM GENERATOR CHANGEOUT;  Surgeon: Deboraha Sprang, MD;  Location: [facility];  Service: Cardiovascular;  Laterality: N/A;  . skin cancer removed  12/2012   removed from his face--Dr. Sarajane Jews  . TONSILLECTOMY    . TOTAL KNEE ARTHROPLASTY Right 12/03/2019   Procedure: TOTAL KNEE ARTHROPLASTY;  Surgeon: Sydnee Cabal, MD;  Location: [facility];  Service: Orthopedics;  Laterality: Right;  adductor canal    Current Outpatient Medications  Medication Sig Dispense Refill  . acetaminophen (TYLENOL) 500 MG tablet Take 500-1,000 mg by mouth every 6 (six) hours as needed for headache (or pain).     Marland Kitchen ascorbic acid (VITAMIN C) 500 MG tablet Take 500 mg by mouth daily.     . calcium carbonate (OS-CAL) 600 MG TABS Take 600 mg by mouth daily.    . chlorthalidone (HYGROTON) 25 MG tablet Take 1 tablet (25 mg total) by mouth daily. 90 tablet 3  . cholecalciferol (VITAMIN D3) 25 MCG (1000 UNIT) tablet Take 1,000 Units by mouth daily.    . ferrous sulfate 325 (65 FE) MG tablet Take 325 mg by mouth 2 (two) times daily with a meal.     . finasteride (PROSCAR) 5 MG tablet Take 1 tablet by mouth daily.    Marland Kitchen gabapentin (NEURONTIN) 100 MG capsule     . Lecithin 1200 MG CAPS Take 1 capsule by mouth daily.    Marland Kitchen loratadine (  CLARITIN) 10 MG tablet Take 10 mg by mouth daily.    Marland Kitchen losartan (COZAAR) 100 MG tablet Take 1 tablet (100 mg total) by mouth daily. 90 tablet 3  . Multiple Vitamin (MULTIVITAMIN) capsule Take 1 capsule by mouth daily.    . naproxen (NAPROSYN) 500 MG tablet TAKE 1 TABLET TWICE A DAY 180 tablet 3  . pantoprazole (PROTONIX) 40 MG tablet TAKE ONE TABLET DAILY. MAKEAN APPOINTMENT FOR FURTHER REFILLS 90 tablet 3  . tamsulosin (FLOMAX) 0.4 MG CAPS capsule Take 0.4 mg by mouth daily.    . traMADol (ULTRAM) 50 MG tablet Take 50 mg by mouth 3 (three) times daily as needed.    Alveda Reasons 15 MG TABS tablet TAKE 1 TABLET DAILY WITH   SUPPER 90 tablet 0    Current Facility-Administered Medications  Medication Dose Route Frequency Provider Last Rate Last Admin  . 0.9 %  sodium chloride infusion  500 mL Intravenous Continuous Pyrtle, Lajuan Lines, MD        Allergies:   Shellfish allergy   Social History: Social History   Socioeconomic History  . Marital status: Married    Spouse name: Primary school teacher  . Number of children: Not on file  . Years of education: Not on file  . Highest education level: Not on file  Occupational History  . Occupation: Retired    Fish farm manager: RETIRED  Tobacco Use  . Smoking status: Never Smoker  . Smokeless tobacco: Never Used  Vaping Use  . Vaping Use: Never used  Substance and Sexual Activity  . Alcohol use: Yes    Alcohol/week: 0.0 standard drinks    Comment: occasionally   . Drug use: No  . Sexual activity: Never    Birth control/protection: Abstinence  Other Topics Concern  . Not on file  Social History Narrative  . Not on file   Social Determinants of Health   Financial Resource Strain: Not on file  Food Insecurity: Not on file  Transportation Needs: Not on file  Physical Activity: Not on file  Stress: Not on file  Social Connections: Not on file  Intimate Partner Violence: Not on file    Family History: Family History  Problem Relation Age of Onset  . Coronary artery disease Father        bladder cancer  . Prostate cancer Father   . Bladder Cancer Father   . Coronary artery disease Mother   . Diabetes Sister        2 sisters  . Colon cancer Neg Hx   . Stomach cancer Neg Hx   . Pancreatic cancer Neg Hx      Review of Systems: All other systems reviewed and are otherwise negative except as noted above.  Physical Exam: Vitals:   11/08/20 1104  BP: 112/70  Pulse: 81  SpO2: 98%  Weight: 222 lb (100.7 kg)  Height: 5\' 8"  (1.727 m)     GEN- The patient is well appearing, alert and oriented x 3 today.   HEENT: normocephalic, atraumatic; sclera clear, conjunctiva pink; hearing  intact; oropharynx clear; neck supple  Lungs- Clear to ausculation bilaterally, normal work of breathing.  No wheezes, rales, rhonchi Heart- Regular rate and rhythm, no murmurs, rubs or gallops  GI- soft, non-tender, non-distended, bowel sounds present  Extremities- no clubbing or cyanosis. No edema MS- no significant deformity or atrophy Skin- warm and dry, no rash or lesion; PPM pocket well healed Psych- euthymic mood, full affect Neuro- strength and sensation are intact  PPM Interrogation- reviewed in detail today,  See PACEART report  EKG:  EKG is not ordered today.  Recent Labs: 07/05/2020: BUN 15; Creatinine, Ser 0.97; Hemoglobin WILL FOLLOW; Magnesium 2.1; Platelets WILL FOLLOW; Potassium 4.1; Sodium 134   Wt Readings from Last 3 Encounters:  11/08/20 222 lb (100.7 kg)  07/10/20 219 lb (99.3 kg)  07/05/20 221 lb 3.2 oz (100.3 kg)     Other studies Reviewed: Additional studies/ records that were reviewed today include:   05/14/16: ETT  Blood pressure demonstrated a normal response to exercise.  There was no ST segment deviation noted during stress. Baseline EKG demonstrates RBBB with T wave inversions in V1 and V2.  No evidence of exercise induced ischemia at an adequate HR reaching 99% MPHR.  Moderately impaired exercise tolerance. The patient exercised to 6.4 mets.  This is a low risk study.  05/14/16: TTE Study Conclusions - Left ventricle: The cavity size was normal. Wall thickness was normal. Systolic function was normal. The estimated ejection fraction was in the range of 50% to 55%. Wall motion was normal; there were no regional wall motion abnormalities. Doppler parameters are consistent with abnormal left ventricular relaxation (grade 1 diastolic dysfunction). - Aortic valve: There was mild regurgitation. - Ascending aorta: The ascending aorta was mildly dilated. - Left atrium: The atrium was mildly dilated. Impressions: - Normal LV  systolic function; grade 1 diastolic dysfunction; mild AI; mild LAE; ascending aorta measures 4.5 cm; suggest CTA or MRA to further assess.  10/224/18: CT chest IMPRESSION: Aneurysmal ascending aorta measuring up to 42 mm diameter. Recommend annual imaging followup by CTA or MRA  Assessment and Plan:  1. Sick sinus, sinus node dysfunction w/PPM Normal device function.  2. Hx of vasovagal syncope No recurrent syncope or near syncope.   3. HTN Stable on current regimen.   4. Trace edema Chronic, likely dependent edema perhaps 2/2 his neuropathy  5. Left shoulder discomfort Sounds musculoskeletal in the setting of arthritis. It is not limiting.  PPM site is stable and functioning normally.  Very low likelihood of cardiac pain with its mild achiness over the past month. Normal ETT 2017  6. Deconditioning Multi-factorial. Body mass index is 33.75 kg/m.  Encouraged weight loss and continued exercise.  Some re-alignment of expectations done today. He discusses less exercise tolerance than this time last year, but also is able to play 18-27 holes of golf without difficulty. Some clarification as well as some of this is "motivation" and mood, rather than fatigue/SOB. He is less likely to want to go and play golf or mow the yard.  We will update Echo for completeness and follow. Labs today.   Current medicines are reviewed at length with the patient today.   The patient does not have concerns regarding his medicines.  The following changes were made today:  none  Labs/ tests ordered today include:  Orders Placed This Encounter  Procedures  . Basic metabolic panel  . CBC  . TSH  . CUP PACEART [place]  . ECHOCARDIOGRAM COMPLETE     Disposition:   Follow up with [name] in 6 Months    Signed, Annamaria Helling  11/08/2020 2:13 PM  [address] [place] [address] [phone]  (office) [phone] (fax)

## 2020-11-09 LAB — BASIC METABOLIC PANEL
BUN/Creatinine Ratio: 13 (ref 10–24)
BUN: 13 mg/dL (ref 8–27)
CO2: 22 mmol/L (ref 20–29)
Calcium: 9.1 mg/dL (ref 8.6–10.2)
Chloride: 92 mmol/L — ABNORMAL LOW (ref 96–106)
Creatinine, Ser: 1.02 mg/dL (ref 0.76–1.27)
Glucose: 117 mg/dL — ABNORMAL HIGH (ref 65–99)
Potassium: 4.1 mmol/L (ref 3.5–5.2)
Sodium: 131 mmol/L — ABNORMAL LOW (ref 134–144)
eGFR: 72 mL/min/{1.73_m2} (ref >59)

## 2020-11-09 LAB — CBC
Hematocrit: 38.4 % (ref 37.5–51.0)
Hemoglobin: 12.8 g/dL — ABNORMAL LOW (ref 13.0–17.7)
MCH: 32.6 pg (ref 26.6–33.0)
MCHC: 33.3 g/dL (ref 31.5–35.7)
MCV: 98 fL — ABNORMAL HIGH (ref 79–97)
Platelets: 270 10*3/uL (ref 150–450)
RBC: 3.93 x10E6/uL — ABNORMAL LOW (ref 4.14–5.80)
RDW: 13.5 % (ref 11.6–15.4)
WBC: 7.4 10*3/uL (ref 3.4–10.8)

## 2020-11-09 LAB — TSH: TSH: 2.09 u[IU]/mL (ref 0.450–4.500)

## 2020-11-22 ENCOUNTER — Ambulatory Visit (INDEPENDENT_AMBULATORY_CARE_PROVIDER_SITE_OTHER): Payer: Medicare HMO

## 2020-11-22 DIAGNOSIS — I495 Sick sinus syndrome: Secondary | ICD-10-CM

## 2020-11-22 LAB — CUP PACEART REMOTE DEVICE CHECK
Battery Remaining Percentage: 75 %
Brady Statistic RA Percent Paced: 34 %
Brady Statistic RV Percent Paced: 0 %
Date Time Interrogation Session: 20220426064806
Implantable Lead Implant Date: 19990330
Implantable Lead Implant Date: 19990330
Implantable Lead Location: 753859
Implantable Lead Location: 753860
Implantable Lead Model: 5068
Implantable Lead Model: 5092
Implantable Pulse Generator Implant Date: 20181024
Lead Channel Impedance Value: 371 Ohm
Lead Channel Impedance Value: 527 Ohm
Lead Channel Pacing Threshold Amplitude: 0.9 V
Lead Channel Pacing Threshold Amplitude: 1 V
Lead Channel Pacing Threshold Pulse Width: 0.4 ms
Lead Channel Pacing Threshold Pulse Width: 0.4 ms
Lead Channel Sensing Intrinsic Amplitude: 0.6 mV
Lead Channel Sensing Intrinsic Amplitude: 3.1 mV
Lead Channel Setting Pacing Amplitude: 2 V
Lead Channel Setting Pacing Amplitude: 2.4 V
Lead Channel Setting Pacing Pulse Width: 0.4 ms
Lead Channel Setting Sensing Sensitivity: 1.5 mV
Pulse Gen Model: 407145
Pulse Gen Serial Number: 69203177

## 2020-12-12 ENCOUNTER — Ambulatory Visit (HOSPITAL_COMMUNITY): Payer: Medicare HMO | Attending: Cardiology

## 2020-12-12 ENCOUNTER — Other Ambulatory Visit: Payer: Self-pay

## 2020-12-12 DIAGNOSIS — I10 Essential (primary) hypertension: Secondary | ICD-10-CM | POA: Diagnosis not present

## 2020-12-12 DIAGNOSIS — R55 Syncope and collapse: Secondary | ICD-10-CM

## 2020-12-12 LAB — ECHOCARDIOGRAM COMPLETE
AR max vel: 2.57 cm2
AV Area VTI: 2.53 cm2
AV Area mean vel: 2.45 cm2
AV Mean grad: 10 mmHg
AV Peak grad: 18.3 mmHg
Ao pk vel: 2.14 m/s
Area-P 1/2: 3.37 cm2
P 1/2 time: 449 msec
S' Lateral: 3.2 cm

## 2020-12-13 NOTE — Progress Notes (Signed)
Remote pacemaker transmission.   

## 2020-12-22 DIAGNOSIS — Z96651 Presence of right artificial knee joint: Secondary | ICD-10-CM | POA: Diagnosis not present

## 2020-12-22 DIAGNOSIS — Z471 Aftercare following joint replacement surgery: Secondary | ICD-10-CM | POA: Diagnosis not present

## 2021-01-18 ENCOUNTER — Ambulatory Visit: Payer: Medicare HMO | Admitting: Sports Medicine

## 2021-01-25 ENCOUNTER — Encounter: Payer: Self-pay | Admitting: Sports Medicine

## 2021-01-25 ENCOUNTER — Other Ambulatory Visit: Payer: Self-pay

## 2021-01-25 ENCOUNTER — Ambulatory Visit: Payer: Medicare HMO | Admitting: Sports Medicine

## 2021-01-25 DIAGNOSIS — M79674 Pain in right toe(s): Secondary | ICD-10-CM

## 2021-01-25 DIAGNOSIS — M79675 Pain in left toe(s): Secondary | ICD-10-CM

## 2021-01-25 DIAGNOSIS — B351 Tinea unguium: Secondary | ICD-10-CM

## 2021-01-25 DIAGNOSIS — G629 Polyneuropathy, unspecified: Secondary | ICD-10-CM | POA: Diagnosis not present

## 2021-01-25 DIAGNOSIS — L570 Actinic keratosis: Secondary | ICD-10-CM

## 2021-01-25 DIAGNOSIS — D689 Coagulation defect, unspecified: Secondary | ICD-10-CM

## 2021-01-25 NOTE — Progress Notes (Signed)
Subjective: Tony Cruz is a 87 y.o. male patient seen today in office with complaint of mildly painful thickened and elongated toenails; unable to trim. Denies any changes with meds, still on Xarelto like before without any changes. Except increase in Gabapentin, not sure if its helping his neuropathy like before. No other issues.   Patient Active Problem List   Diagnosis Date Noted   Osteoarthritis of right knee 12/03/2019   Pain in right knee 12/30/2017   Anemia, iron deficiency 12/11/2017   Toe ulcer ([place]) 01/13/2017   Heterozygous factor V Leiden mutation ([place]) 01/13/2017   DVT (deep venous thrombosis) ([place]) 08/22/2016   History of skull fracture 10/19/2015   History of subdural hematoma 10/19/2015   Headache(784.0) 09/30/2013   Sinoatrial node dysfunction ([place]) 06/01/2013   Pacemaker-MDT 07/02/2011   Night sweats 07/02/2011   DIZZINESS 03/22/2010   Impaired fasting glucose 11/02/2009   Obstructive sleep apnea 11/21/2008   Backache 11/03/2008   Overweight 11/02/2007   Essential hypertension 11/02/2007   ALLERGIC RHINITIS 11/02/2007   GASTROESOPHAGEAL REFLUX DISEASE 11/02/2007   Diverticulosis of large intestine 11/02/2007   Benign prostatic hyperplasia with urinary obstruction 11/02/2007   SYNCOPE 11/02/2007   Hereditary and idiopathic peripheral neuropathy 10/30/2007   IRRITABLE BOWEL SYNDROME 10/30/2007   Osteoarthritis 10/30/2007    Current Outpatient Medications on File Prior to Visit  Medication Sig Dispense Refill   acetaminophen (TYLENOL) 500 MG tablet Take 500-1,000 mg by mouth every 6 (six) hours as needed for headache (or pain).      ascorbic acid (VITAMIN C) 500 MG tablet Take 500 mg by mouth daily.      calcium carbonate (OS-CAL) 600 MG TABS Take 600 mg by mouth daily.     chlorthalidone (HYGROTON) 25 MG tablet Take 1 tablet (25 mg total) by mouth daily. 90 tablet 3   cholecalciferol (VITAMIN D3) 25 MCG (1000 UNIT) tablet Take 1,000 Units by mouth  daily.     ferrous sulfate 325 (65 FE) MG tablet Take 325 mg by mouth 2 (two) times daily with a meal.      finasteride (PROSCAR) 5 MG tablet Take 1 tablet by mouth daily.     gabapentin (NEURONTIN) 100 MG capsule      Lecithin 1200 MG CAPS Take 1 capsule by mouth daily.     loratadine (CLARITIN) 10 MG tablet Take 10 mg by mouth daily.     losartan (COZAAR) 100 MG tablet Take 1 tablet (100 mg total) by mouth daily. 90 tablet 3   Multiple Vitamin (MULTIVITAMIN) capsule Take 1 capsule by mouth daily.     naproxen (NAPROSYN) 500 MG tablet TAKE 1 TABLET TWICE A DAY 180 tablet 3   pantoprazole (PROTONIX) 40 MG tablet TAKE ONE TABLET DAILY. MAKEAN APPOINTMENT FOR FURTHER REFILLS 90 tablet 3   tamsulosin (FLOMAX) 0.4 MG CAPS capsule Take 0.4 mg by mouth daily.     traMADol (ULTRAM) 50 MG tablet Take 50 mg by mouth 3 (three) times daily as needed.     XARELTO 15 MG TABS tablet TAKE 1 TABLET DAILY WITH   SUPPER 90 tablet 0   Current Facility-Administered Medications on File Prior to Visit  Medication Dose Route Frequency Provider Last Rate Last Admin   0.9 %  sodium chloride infusion  500 mL Intravenous Continuous Pyrtle, Lajuan Lines, MD        Allergies  Allergen Reactions   Shellfish Allergy Rash    Sensitive to shellfish    Objective: Physical Exam  General: Well developed, nourished, no acute distress, awake, alert and oriented x 3  Vascular: Dorsalis pedis artery 1/4 bilateral, Posterior tibial artery 0/4 bilateral, skin temperature warm to warm proximal to distal bilateral lower extremities due to trace edema, + varicosities, pedal hair present bilateral.  Neurological: Gross sensation present via light touch bilateral.   Dermatological: Skin is warm, dry, and supple bilateral, Nails 1-10 are tender, long, thick, and discolored with mild subungal debris, Stable dry blood at left 4th toe with no signs of infection,  +keratosis to left 2nd toe. No signs of infection  bilateral.  Musculoskeletal:  Asymptomatic claw/hammertoes boney deformities noted bilateral. Muscular strength within normal limits without painon range of motion. No pain with calf compression bilateral.  Assessment and Plan:  Problem List Items Addressed This Visit   None Visit Diagnoses     Pain due to onychomycosis of toenails of both feet    -  Primary   Keratosis       Neuropathy       Coagulation defect ([place])           -Examined patient.  -Re-Discussed with patient importance of daily foot inspection in the setting of neuropathy -Mechanically debrided and reduced mycotic nails with sterile nail nipper and dremel nail file without incident. -At no charge trimmed corn/callus at left 2nd toe using sterile 15 blade without incident -Patient to return in 3 months for follow up evaluation or sooner if symptoms worsen.  Landis Martins, DPM

## 2021-01-26 DIAGNOSIS — M5459 Other low back pain: Secondary | ICD-10-CM | POA: Diagnosis not present

## 2021-02-05 ENCOUNTER — Institutional Professional Consult (permissible substitution): Payer: Medicare HMO | Admitting: Neurology

## 2021-02-21 ENCOUNTER — Ambulatory Visit (INDEPENDENT_AMBULATORY_CARE_PROVIDER_SITE_OTHER): Payer: Medicare HMO

## 2021-02-21 DIAGNOSIS — I495 Sick sinus syndrome: Secondary | ICD-10-CM | POA: Diagnosis not present

## 2021-02-21 LAB — CUP PACEART REMOTE DEVICE CHECK
Date Time Interrogation Session: 20220725105859
Implantable Lead Implant Date: 19990330
Implantable Lead Implant Date: 19990330
Implantable Lead Location: 753859
Implantable Lead Location: 753860
Implantable Lead Model: 5068
Implantable Lead Model: 5092
Implantable Pulse Generator Implant Date: 20181024
Pulse Gen Model: 407145
Pulse Gen Serial Number: 69203177

## 2021-02-22 NOTE — Telephone Encounter (Signed)
Unsuccessful telephone encounter to Tony Cruz in response to his MyChart question regarding increase in PVC burden. [employer] compliant VM message left requesting call back to [phone].

## 2021-02-23 ENCOUNTER — Telehealth: Payer: Self-pay

## 2021-02-23 NOTE — Telephone Encounter (Signed)
Portia called the patient to try to explain the increasing pvc's that was shown on his transmission. I told him I will have the nurse Marliss Czar give him a call back.

## 2021-02-23 NOTE — Telephone Encounter (Signed)
Explained to patient about PVC burden. Patient does report over the summer while playing golf he has experienced lightheadedness after he sits down. Reports good hydration and compliance with medications. Reports he has been concerned about his blood pressure reading which was 127/49. Patient read other days and they were all more consistent with 120/60. Advised patient on best time to check his blood pressure and if he gets a concerning number to switch arms and retake it. Advised I will forward to [name] giving his recent symptoms and increase in PVC burden. Patient appreciative of call.   ED precautions discussed with patient with verbal understanding.

## 2021-02-27 DIAGNOSIS — D1801 Hemangioma of skin and subcutaneous tissue: Secondary | ICD-10-CM | POA: Diagnosis not present

## 2021-02-27 DIAGNOSIS — Z85828 Personal history of other malignant neoplasm of skin: Secondary | ICD-10-CM | POA: Diagnosis not present

## 2021-02-27 DIAGNOSIS — L570 Actinic keratosis: Secondary | ICD-10-CM | POA: Diagnosis not present

## 2021-02-27 DIAGNOSIS — L821 Other seborrheic keratosis: Secondary | ICD-10-CM | POA: Diagnosis not present

## 2021-02-27 DIAGNOSIS — M545 Low back pain, unspecified: Secondary | ICD-10-CM | POA: Diagnosis not present

## 2021-02-27 DIAGNOSIS — L720 Epidermal cyst: Secondary | ICD-10-CM | POA: Diagnosis not present

## 2021-02-27 DIAGNOSIS — L812 Freckles: Secondary | ICD-10-CM | POA: Diagnosis not present

## 2021-03-05 DIAGNOSIS — Z125 Encounter for screening for malignant neoplasm of prostate: Secondary | ICD-10-CM | POA: Diagnosis not present

## 2021-03-05 DIAGNOSIS — I10 Essential (primary) hypertension: Secondary | ICD-10-CM | POA: Diagnosis not present

## 2021-03-14 DIAGNOSIS — M545 Low back pain, unspecified: Secondary | ICD-10-CM | POA: Diagnosis not present

## 2021-03-19 ENCOUNTER — Encounter: Payer: Self-pay | Admitting: *Deleted

## 2021-03-19 NOTE — Progress Notes (Signed)
Remote pacemaker transmission.   

## 2021-03-20 ENCOUNTER — Encounter: Payer: Self-pay | Admitting: Neurology

## 2021-03-20 ENCOUNTER — Ambulatory Visit: Payer: Medicare HMO | Admitting: Neurology

## 2021-03-20 VITALS — BP 114/67 | HR 79 | Ht 69.0 in | Wt 223.0 lb

## 2021-03-20 DIAGNOSIS — G4733 Obstructive sleep apnea (adult) (pediatric): Secondary | ICD-10-CM

## 2021-03-20 DIAGNOSIS — Z9989 Dependence on other enabling machines and devices: Secondary | ICD-10-CM

## 2021-03-20 DIAGNOSIS — E669 Obesity, unspecified: Secondary | ICD-10-CM

## 2021-03-20 DIAGNOSIS — E66811 Obesity, class 1: Secondary | ICD-10-CM

## 2021-03-20 DIAGNOSIS — R351 Nocturia: Secondary | ICD-10-CM

## 2021-03-20 NOTE — Progress Notes (Deleted)
Subjective:    Patient ID: Tony Cruz is a 87 y.o. male.  HPI {Common ambulatory SmartLinks:19316}  Review of Systems  Objective:  Neurological Exam  Physical Exam  Assessment:   ***  Plan:   ***

## 2021-03-20 NOTE — Patient Instructions (Signed)
Thank you for choosing [facility] for your sleep related care! It was nice to meet you today! I appreciate that you entrust me with your sleep related healthcare concerns. I hope, I was able to address at least some of your concerns today, and that I can help you feel reassured and also get better.    Here is what we discussed today and what we came up with as our plan for you:    Based on your symptoms and your exam I believe you are still at risk for obstructive sleep apnea and would benefit from reevaluation as it has been many years and you need new supplies and an updated machine. Therefore, I think we should proceed with a sleep study to determine how severe your sleep apnea is. If you have more than mild OSA, I want you to consider ongoing treatment with CPAP. Please remember, the risks and ramifications of moderate to severe obstructive sleep apnea or OSA are: Cardiovascular disease, including congestive heart failure, stroke, difficult to control hypertension, arrhythmias, and even type 2 diabetes has been linked to untreated OSA. Sleep apnea causes disruption of sleep and sleep deprivation in most cases, which, in turn, can cause recurrent headaches, problems with memory, mood, concentration, focus, and vigilance. Most people with untreated sleep apnea report excessive daytime sleepiness, which can affect their ability to drive. Please do not drive if you feel sleepy.   I will likely see you back after your sleep study to go over the test results and where to go from there. We will call you after your sleep study to advise about the results (most likely, you will hear from Kristen, my nurse) and to set up an appointment at the time, as necessary.    Our sleep lab administrative assistant will call you to schedule your sleep study. If you don't hear back from her by about 2 weeks from now, please feel free to call her at [phone]. You can leave a message with your phone  number and concerns, if you get the voicemail box. She will call back as soon as possible.     

## 2021-03-20 NOTE — Progress Notes (Signed)
Subjective:    Patient ID: Tony Cruz is a 87 y.o. male.  HPI    Star Age, MD, PhD [facility] [address], [address] P.O. [place], [place] [address]  Dear Dr. Ardeth Perfect,  I saw your patient, Tony Cruz, upon your kind request in my sleep clinic today for initial consultation of his sleep disorder, in particular, evaluation of his prior diagnosis of obstructive sleep apnea.  The patient is unaccompanied today.  As you know, Tony Cruz is an 87 year old right-handed gentleman with an underlying medical history of arthritis, status post right total knee replacement in May 2021, BPH, DVT, reflux disease, factor V Leiden deficiency, hypertension, irritable bowel syndrome, diverticulosis, syncope, status post pacemaker, neuropathy, allergies, anemia, and obesity, who was previously diagnosed with obstructive sleep apnea and placed on CPAP therapy.  He reports compliance with treatment.  Prior sleep study results are not available for my review today.  Previous testing was through the [place] in [place], [place] is [place].  I reviewed your office note from 02/29/2020.  His Epworth sleepiness score is 5 out of 24, fatigue severity score is 28 out of 63.  A CPAP compliance download was not possible as his machine is older.  He brought his old machine but has not used it since approximately June.  Motor life has been exceeded.  He has been using a friend's machine but does not know to setting on it.  He uses a Copy full facemask from [employer]	[place].  He has over the course of time benefited from CPAP therapy and is compliant with treatment in general.  Bedtime is generally around 11:30 PM and rise time around 7:30 AM.  He has nocturia about once or twice per average night.  He does have sinus congestion which does become bothersome at night.  He does not have a family history of sleep apnea.  He is retired, worked in [employer].  He plays golf about twice a  week.  He is a non-smoker and drinks alcohol rarely.  Does not drink caffeine on a day-to-day basis.  He has 2 grown children.  He does not have a TV in the bedroom, no pets in the household.  His Past Medical History Is Significant For: Past Medical History:  Diagnosis Date   Allergy    Anal fissure    Anemia    Arthritis    diffuse arthritis   Backache, unspecified    Barrett's esophagus    BPH (benign prostatic hyperplasia)    Diverticulosis of colon (without mention of hemorrhage)    DVT of leg (deep venous thrombosis) (HCC)    Esophageal reflux    Factor V Leiden deficiency (HCC)    GERD (gastroesophageal reflux disease)    hypertension    Hypertrophy of prostate with urinary obstruction and other lower urinary tract symptoms (LUTS)    Irritable bowel syndrome    Obstructive sleep apnea    Osteoarthrosis, unspecified whether generalized or localized, unspecified site    Other abnormal glucose    Overweight(278.02)    Pacemaker-MDT    Change out 2008   Sleep apnea 2016   Uses cpap   Syncope and collapse    Tubular adenoma of colon    Unspecified hereditary and idiopathic peripheral neuropathy     His Past Surgical History Is Significant For: Past Surgical History:  Procedure Laterality Date   APPENDECTOMY     CHOLECYSTECTOMY     PACEMAKER PLACEMENT  PPM GENERATOR CHANGEOUT N/A 05/21/2017   Procedure: PPM GENERATOR CHANGEOUT;  Surgeon: Deboraha Sprang, MD;  Location: [facility];  Service: Cardiovascular;  Laterality: N/A;   skin cancer removed  12/2012   removed from his face--Dr. [place] Right 12/03/2019   Procedure: TOTAL KNEE ARTHROPLASTY;  Surgeon: Sydnee Cabal, MD;  Location: [facility];  Service: Orthopedics;  Laterality: Right;  adductor canal    His Family History Is Significant For: Family History  Problem Relation Age of Onset   Coronary artery disease Mother    Coronary artery disease Father         bladder cancer   Prostate cancer Father    Bladder Cancer Father    Heart Problems Father    Diabetes Sister        2 sisters   Congestive Heart Failure Sister    CVA Sister    Diabetes Sister    Colon cancer Neg Hx    Stomach cancer Neg Hx    Pancreatic cancer Neg Hx    Sleep apnea Neg Hx     His Social History Is Significant For: Social History   Socioeconomic History   Marital status: Married    Spouse name: Tony Cruz   Number of children: Not on file   Years of education: Not on file   Highest education level: Not on file  Occupational History   Occupation: Retired    Fish farm manager: RETIRED  Tobacco Use   Smoking status: Never   Smokeless tobacco: Never  Vaping Use   Vaping Use: Never used  Substance and Sexual Activity   Alcohol use: Yes    Alcohol/week: 0.0 standard drinks    Comment: occasionally    Drug use: No   Sexual activity: Never    Birth control/protection: Abstinence  Other Topics Concern   Not on file  Social History Narrative   Not on file   Social Determinants of Health   Financial Resource Strain: Not on file  Food Insecurity: Not on file  Transportation Needs: Not on file  Physical Activity: Not on file  Stress: Not on file  Social Connections: Not on file    His Allergies Are:  Allergies  Allergen Reactions   Shellfish Allergy Rash    Sensitive to shellfish  :   His Current Medications Are:  Outpatient Encounter Medications as of 03/20/2021  Medication Sig   acetaminophen (TYLENOL) 500 MG tablet Take 500-1,000 mg by mouth every 6 (six) hours as needed for headache (or pain).    ascorbic acid (VITAMIN C) 500 MG tablet Take 500 mg by mouth daily.    calcium carbonate (OS-CAL) 600 MG TABS Take 600 mg by mouth daily.   chlorthalidone (HYGROTON) 25 MG tablet Take 1 tablet (25 mg total) by mouth daily.   cholecalciferol (VITAMIN D3) 25 MCG (1000 UNIT) tablet Take 1,000 Units by mouth daily.   ferrous sulfate 325 (65 FE) MG tablet  Take 325 mg by mouth 2 (two) times daily with a meal.    finasteride (PROSCAR) 5 MG tablet Take 1 tablet by mouth daily.   gabapentin (NEURONTIN) 100 MG capsule    Lecithin 1200 MG CAPS Take 1 capsule by mouth daily.   loratadine (CLARITIN) 10 MG tablet Take 10 mg by mouth daily.   losartan (COZAAR) 100 MG tablet Take 1 tablet (100 mg total) by mouth daily.   Multiple Vitamin (MULTIVITAMIN) capsule Take 1 capsule by  mouth daily.   naproxen (NAPROSYN) 500 MG tablet TAKE 1 TABLET TWICE A DAY   pantoprazole (PROTONIX) 40 MG tablet TAKE ONE TABLET DAILY. MAKEAN APPOINTMENT FOR FURTHER REFILLS   tamsulosin (FLOMAX) 0.4 MG CAPS capsule Take 0.4 mg by mouth daily.   traMADol (ULTRAM) 50 MG tablet Take 50 mg by mouth 3 (three) times daily as needed.   XARELTO 15 MG TABS tablet TAKE 1 TABLET DAILY WITH   SUPPER   Facility-Administered Encounter Medications as of 03/20/2021  Medication   0.9 %  sodium chloride infusion  :   Review of Systems:  Out of a complete 14 point review of systems, all are reviewed and negative with the exception of these symptoms as listed below:  Review of Systems  Neurological:        Pt is here due to sleep apnea. Pt states that his CPAP machine was broke and the MD who was helping him with his CPAP machine retired . He is here because CPAP is broke and he needs help getting another. Pt has been using a friends CPAP machine at night. The CPAP he uses at night he left at home   Pt states that he does sleep much better using it   Epworth Sleepiness Scale 0= would never doze 1= slight chance of dozing 2= moderate chance of dozing 3= high chance of dozing  Sitting and reading:1 Watching TV:1 Sitting inactive in a public place (ex. Theater or meeting):0 As a passenger in a car for an hour without a break:0 Lying down to rest in the afternoon:3 Sitting and talking to someone:0 Sitting quietly after lunch (no alcohol):0 In a car, while stopped in traffic:0 Total: 5    Objective:  Neurological Exam  Physical Exam Physical Examination:   Vitals:   03/20/21 1110  BP: 114/67  Pulse: 79   General Examination: The patient is a very pleasant 87 y.o. male in no acute distress. He appears well-developed and well-nourished and well groomed.   HEENT: Normocephalic, atraumatic, pupils are equal, round and reactive to light, extraocular tracking is good without limitation to gaze excursion or nystagmus noted. Hearing is grossly intact. Face is symmetric with normal facial animation. Speech is clear with no dysarthria noted. There is no hypophonia. There is no lip, neck/head, jaw or voice tremor. Neck is supple with full range of passive and active motion. There are no carotid bruits on auscultation. Oropharynx exam reveals: mild mouth dryness, adequate dental hygiene and mild airway crowding, due to redundant soft palate, tonsils absent.  Mallampati class I.  Neck circumference of 18 1/4 inches.  Minimal overbite.  Tongue protrudes centrally and palate elevates symmetrically.  Chest: Clear to auscultation without wheezing, rhonchi or crackles noted.  Heart: S1+S2+0, regular and normal without murmurs, rubs or gallops noted.   Abdomen: Soft, non-tender and non-distended with normal bowel sounds appreciated on auscultation.  Extremities: There is no pitting edema in the distal lower extremities bilaterally.   Skin: Warm and dry without trophic changes noted.   Musculoskeletal: exam reveals no obvious joint deformities, unremarkable scar from right total knee replacement.    Neurologically:  Mental status: The patient is awake, alert and oriented in all 4 spheres. His immediate and remote memory, attention, language skills and fund of knowledge are appropriate. There is no evidence of aphasia, agnosia, apraxia or anomia. Speech is clear with normal prosody and enunciation. Thought process is linear. Mood is normal and affect is normal.  Cranial nerves II -  XII  are as described above under HEENT exam.  Motor exam: Normal bulk, strength and tone is noted. There is no tremor, fine motor skills and coordination: grossly intact.  Cerebellar testing: No dysmetria or intention tremor. There is no truncal or gait ataxia.  Sensory exam: intact to light touch in the upper and lower extremities.  Gait, station and balance: He stands easily. No veering to one side is noted. No leaning to one side is noted. Posture is age-appropriate and stance is narrow based. Gait shows normal stride length and normal pace. No problems turning are noted.   Assessment and Plan:  In summary, Miranda Mcelhinny is a very pleasant 87 y.o.-year old male with an underlying medical history of arthritis, status post right total knee replacement in May 2021, BPH, DVT, reflux disease, factor V Leiden deficiency, hypertension, irritable bowel syndrome, diverticulosis, syncope, status post pacemaker, neuropathy, allergies, anemia, and obesity, who presents for evaluation of his obstructive sleep apnea (OSA).  He was diagnosed through the [place] in 2004.  He reports compliance with his CPAP machine, he has a ResMed S 9 machine.  Motor life has been exceeded and he has been using a friend's machine, reports that he has the same model.  He is agreeable to reevaluating his sleep apnea with an updated sleep study as his study has been over 98 years old and prior study results are not available for review.  He is advised to proceed with a split-night sleep study, ideally a laboratory attended sleep study would be more accurate and comprehensive.  If need be, we can pursue a home sleep test.  We talked about the difference between a laboratory attended sleep study versus home sleep test.  We talked about the importance of treating sleep apnea and pursuing a healthy lifestyle.  He reports a longstanding history of neuropathy, was originally diagnosed with Dr. Doy Mince in neurology.  He has been on gabapentin for  years.  He is advised to continue using his current machine until we can establish him on a new set of equipment.  He is advised to continue to show compliance with treatment especially once he starts the new machine, he will need a compliance visit as well.  I answered all his questions today and he was in agreement with the plan.  We will plan a follow-up after testing. Thank you very much for allowing me to participate in the care of this nice patient. If I can be of any further assistance to you please do not hesitate to call me at [phone].  Sincerely,   Star Age, MD, PhD

## 2021-03-21 DIAGNOSIS — D692 Other nonthrombocytopenic purpura: Secondary | ICD-10-CM | POA: Diagnosis not present

## 2021-03-21 DIAGNOSIS — Z23 Encounter for immunization: Secondary | ICD-10-CM | POA: Diagnosis not present

## 2021-03-21 DIAGNOSIS — G4733 Obstructive sleep apnea (adult) (pediatric): Secondary | ICD-10-CM | POA: Diagnosis not present

## 2021-03-21 DIAGNOSIS — Z950 Presence of cardiac pacemaker: Secondary | ICD-10-CM | POA: Diagnosis not present

## 2021-03-21 DIAGNOSIS — D649 Anemia, unspecified: Secondary | ICD-10-CM | POA: Diagnosis not present

## 2021-03-21 DIAGNOSIS — Z0000 Encounter for general adult medical examination without abnormal findings: Secondary | ICD-10-CM | POA: Diagnosis not present

## 2021-03-21 DIAGNOSIS — R82998 Other abnormal findings in urine: Secondary | ICD-10-CM | POA: Diagnosis not present

## 2021-03-21 DIAGNOSIS — D6851 Activated protein C resistance: Secondary | ICD-10-CM | POA: Diagnosis not present

## 2021-03-21 DIAGNOSIS — D6869 Other thrombophilia: Secondary | ICD-10-CM | POA: Diagnosis not present

## 2021-03-21 DIAGNOSIS — N401 Enlarged prostate with lower urinary tract symptoms: Secondary | ICD-10-CM | POA: Diagnosis not present

## 2021-03-21 DIAGNOSIS — N390 Urinary tract infection, site not specified: Secondary | ICD-10-CM | POA: Diagnosis not present

## 2021-03-21 DIAGNOSIS — Z1331 Encounter for screening for depression: Secondary | ICD-10-CM | POA: Diagnosis not present

## 2021-03-21 DIAGNOSIS — I10 Essential (primary) hypertension: Secondary | ICD-10-CM | POA: Diagnosis not present

## 2021-03-21 DIAGNOSIS — R42 Dizziness and giddiness: Secondary | ICD-10-CM | POA: Diagnosis not present

## 2021-03-21 DIAGNOSIS — Z1339 Encounter for screening examination for other mental health and behavioral disorders: Secondary | ICD-10-CM | POA: Diagnosis not present

## 2021-03-22 ENCOUNTER — Encounter: Payer: Self-pay | Admitting: Neurology

## 2021-03-22 ENCOUNTER — Telehealth: Payer: Self-pay | Admitting: Internal Medicine

## 2021-03-22 ENCOUNTER — Other Ambulatory Visit: Payer: Self-pay

## 2021-03-22 DIAGNOSIS — G4733 Obstructive sleep apnea (adult) (pediatric): Secondary | ICD-10-CM | POA: Diagnosis not present

## 2021-03-22 DIAGNOSIS — R195 Other fecal abnormalities: Secondary | ICD-10-CM

## 2021-03-22 NOTE — Telephone Encounter (Signed)
Left message with Estill Bamberg at PCP office with all of Dr. Garth Schlatter recommendations. OV scheduled with Amy Esterwood on 04/04/21 @ 1:30. A capsule study has been ordered for 04/05/21 @ 8:30. Instructions for Capsule Endo to be sent through my chart. Left message for pt to call back.

## 2021-03-22 NOTE — Telephone Encounter (Signed)
Agree with plans for IV iron PCP should monitor CBC closely and then follow-up response to IV iron We recently did an upper endoscopy and colonoscopy to evaluate low iron levels, this was largely unremarkable though there was gastritis without bleeding  I would recommend that he be on at least daily PPI I also recommend we proceed with outpatient video capsule endoscopy as soon as possible Office follow-up with [place] also reasonable If he has overt bleeding such as melena or blood in stools he needs to let us know as this would likely warrant inpatient evaluation

## 2021-03-22 NOTE — Telephone Encounter (Signed)
Tony Cruz with PCP called to inform Dr. Germain Osgood that pt got a positive hemosure, and his hgb decreased from 14 to 9. PCP is suspicous of GI bleed. PCP will set him up for infusion at the hospital and wanted to bring this to Dr. Vena Rua attention. Any questions pls Pls call Amanda at [phone].

## 2021-03-22 NOTE — Telephone Encounter (Signed)
Spoke with Estill Bamberg in regard to Pt. Estill Bamberg stated that they will set him up for an Iron Infusion. Please see note below.

## 2021-03-23 NOTE — Telephone Encounter (Signed)
Pt made aware of Dr. Hilarie Fredrickson recommendations. Pt made aware of follow up appt along with Capsule Endoscopy Appt. Instructions for Capsule Endoscopy sent through My Chart.

## 2021-03-28 ENCOUNTER — Other Ambulatory Visit (HOSPITAL_COMMUNITY): Payer: Self-pay | Admitting: *Deleted

## 2021-03-29 ENCOUNTER — Encounter (HOSPITAL_COMMUNITY)
Admission: RE | Admit: 2021-03-29 | Discharge: 2021-03-29 | Disposition: A | Discharge status: Home / Self Care | Payer: Medicare HMO | Source: Ambulatory Visit | Attending: Internal Medicine | Admitting: Internal Medicine

## 2021-03-29 DIAGNOSIS — D649 Anemia, unspecified: Secondary | ICD-10-CM | POA: Insufficient documentation

## 2021-03-29 MED ORDER — SODIUM CHLORIDE 0.9 % IV SOLN
510.0000 mg | INTRAVENOUS | Status: DC
  Administered 2021-03-29: 510 mg via INTRAVENOUS
  Filled 2021-03-29: qty 510

## 2021-04-04 ENCOUNTER — Encounter: Payer: Self-pay | Admitting: Physician Assistant

## 2021-04-04 ENCOUNTER — Ambulatory Visit: Payer: Medicare HMO | Admitting: Physician Assistant

## 2021-04-04 ENCOUNTER — Other Ambulatory Visit (INDEPENDENT_AMBULATORY_CARE_PROVIDER_SITE_OTHER): Payer: Medicare HMO

## 2021-04-04 VITALS — BP 120/60 | HR 91 | Ht 68.0 in | Wt 224.0 lb

## 2021-04-04 DIAGNOSIS — D509 Iron deficiency anemia, unspecified: Secondary | ICD-10-CM

## 2021-04-04 DIAGNOSIS — Z7901 Long term (current) use of anticoagulants: Secondary | ICD-10-CM | POA: Diagnosis not present

## 2021-04-04 DIAGNOSIS — R195 Other fecal abnormalities: Secondary | ICD-10-CM | POA: Diagnosis not present

## 2021-04-04 LAB — CBC WITH DIFFERENTIAL/PLATELET
Basophils Absolute: 0.1 10*3/uL (ref 0.0–0.1)
Basophils Relative: 1.5 % (ref 0.0–3.0)
Eosinophils Absolute: 0.9 10*3/uL — ABNORMAL HIGH (ref 0.0–0.7)
Eosinophils Relative: 9.9 % — ABNORMAL HIGH (ref 0.0–5.0)
HCT: 33.4 % — ABNORMAL LOW (ref 39.0–52.0)
Hemoglobin: 11 g/dL — ABNORMAL LOW (ref 13.0–17.0)
Lymphocytes Relative: 25.3 % (ref 12.0–46.0)
Lymphs Abs: 2.2 10*3/uL (ref 0.7–4.0)
MCHC: 32.9 g/dL (ref 30.0–36.0)
MCV: 88.6 fl (ref 78.0–100.0)
Monocytes Absolute: 1.1 10*3/uL — ABNORMAL HIGH (ref 0.1–1.0)
Monocytes Relative: 12.4 % — ABNORMAL HIGH (ref 3.0–12.0)
Neutro Abs: 4.4 10*3/uL (ref 1.4–7.7)
Neutrophils Relative %: 50.9 % (ref 43.0–77.0)
Platelets: 302 10*3/uL (ref 150.0–400.0)
RBC: 3.77 Mil/uL — ABNORMAL LOW (ref 4.22–5.81)
RDW: 24.3 % — ABNORMAL HIGH (ref 11.5–15.5)
WBC: 8.6 10*3/uL (ref 4.0–10.5)

## 2021-04-04 NOTE — Patient Instructions (Signed)
If you are age 85 or older, your body mass index should be between 23-30. Your Body mass index is 34.06 kg/m. If this is out of the aforementioned range listed, please consider follow up with your Primary Care Provider. __________________________________________________________  The [facility] providers would like to encourage you to use [facility] to communicate with providers for non-urgent requests or questions.  Due to long hold times on the telephone, sending your provider a message by [facility] may be a faster and more efficient way to get a response.  Please allow 48 business hours for a response.  Please remember that this is for non-urgent requests.   Your provider has requested that you go to the basement level for lab work before leaving today. Press "B" on the elevator. The lab is located at the first door on the left as you exit the elevator.  STOP using Naproxen  You can use Tramadol  Continue to use Pantoprazole 40 mg twice daily before meals.  Keep appointment for tomorrow.  Thank you for entrusting me with your care and choosing [facility].  Amy Esterwood, PA-C

## 2021-04-04 NOTE — Progress Notes (Signed)
Subjective:    Patient ID: Tony Cruz, male    DOB: Aug 25, 1933, 87 y.o.   MRN: [id number]  HPI Huntyr is a pleasant 87 year old white male, established with Dr. Hilarie Fredrickson, referred back currently by Dr. Ardeth Perfect for evaluation of iron deficiency anemia associated with drop in hemoglobin and recent positive Hemosure.  Patient had hemoglobin of 12.8 in April 2022, on 03/05/2021 hemoglobin 10.2, 03/21/2021 hemoglobin 9.7 hematocrit 29 and ferritin 10.9  Patient had an iron infusion last week and is scheduled to have a second infusion early next week. He has not been aware of any melena or hematochezia.  He had been started on an oral iron tablet and says his stools were a bit darker with that.  He has history of IBS and has had alternating bowel movements, sometimes mild constipation and sometimes loose and this pattern has not changed.  He has no complaints of abdominal pain or discomfort.  Appetite has been fine, weight has been stable, no heartburn or indigestion.  He says he had been feeling a bit fatigued, and is hopeful the iron infusions will continue to help that. He had undergone EGD in December 2021 done for heme positive stool,with finding of a 1 cm hiatal hernia and moderate gastritis, there were a few sessile polyps less than 10 mm.  Biopsies of stomach showed reactive gastropathy with reactive atypia and ulceration, no H. pylori. Colonoscopy was also done December 2021 with finding of multiple diverticuli and internal hemorrhoids, otherwise negative exam.  At that time he was advised to start a daily PPI which has since been increased to twice daily. Patient had been chronically taking naproxen for arthritic symptoms 500 mg twice daily.  He stopped this a few days ago.  He is also on chronic Xarelto with history of prior DVT and factor V Lieden. Other medical issues include hypertension, sleep apnea, GERD, osteoarthritis, diverticulosis, BPH, he is status post pacemaker.  Review of  Systems Pertinent positive and negative review of systems were noted in the above HPI section.  All other review of systems was otherwise negative.   Outpatient Encounter Medications as of 04/04/2021  Medication Sig   acetaminophen (TYLENOL) 500 MG tablet Take 500-1,000 mg by mouth every 6 (six) hours as needed for headache (or pain).    ascorbic acid (VITAMIN C) 500 MG tablet Take 500 mg by mouth daily.    calcium carbonate (OS-CAL) 600 MG TABS Take 600 mg by mouth daily.   chlorthalidone (HYGROTON) 25 MG tablet Take 1 tablet (25 mg total) by mouth daily.   cholecalciferol (VITAMIN D3) 25 MCG (1000 UNIT) tablet Take 1,000 Units by mouth daily.   ferrous sulfate 325 (65 FE) MG tablet Take 325 mg by mouth 2 (two) times daily with a meal.    finasteride (PROSCAR) 5 MG tablet Take 1 tablet by mouth daily.   gabapentin (NEURONTIN) 100 MG capsule    Lecithin 1200 MG CAPS Take 1 capsule by mouth daily.   loratadine (CLARITIN) 10 MG tablet Take 10 mg by mouth daily.   losartan (COZAAR) 100 MG tablet Take 1 tablet (100 mg total) by mouth daily.   Multiple Vitamin (MULTIVITAMIN) capsule Take 1 capsule by mouth daily.   naproxen (NAPROSYN) 500 MG tablet TAKE 1 TABLET TWICE A DAY   pantoprazole (PROTONIX) 40 MG tablet TAKE ONE TABLET DAILY. MAKEAN APPOINTMENT FOR FURTHER REFILLS   tamsulosin (FLOMAX) 0.4 MG CAPS capsule Take 0.4 mg by mouth daily.   traMADol (ULTRAM) 50 MG  tablet Take 50 mg by mouth 3 (three) times daily as needed.   valACYclovir (VALTREX) 1000 MG tablet Take 1,000 mg by mouth 3 (three) times daily.   XARELTO 15 MG TABS tablet TAKE 1 TABLET DAILY WITH   SUPPER   Facility-Administered Encounter Medications as of 04/04/2021  Medication   0.9 %  sodium chloride infusion   Allergies  Allergen Reactions   Shellfish Allergy Rash    Sensitive to shellfish   Patient Active Problem List   Diagnosis Date Noted   Osteoarthritis of right knee 12/03/2019   Pain in right knee 12/30/2017    Iron deficiency anemia 12/11/2017   Toe ulcer ([place]) 01/13/2017   Heterozygous factor V Leiden mutation ([place]) 01/13/2017   DVT (deep venous thrombosis) ([place]) 08/22/2016   History of skull fracture 10/19/2015   History of subdural hematoma 10/19/2015   Headache(784.0) 09/30/2013   Sinoatrial node dysfunction ([place]) 06/01/2013   Pacemaker-MDT 07/02/2011   Night sweats 07/02/2011   DIZZINESS 03/22/2010   Impaired fasting glucose 11/02/2009   Obstructive sleep apnea 11/21/2008   Backache 11/03/2008   Overweight 11/02/2007   Essential hypertension 11/02/2007   ALLERGIC RHINITIS 11/02/2007   GASTROESOPHAGEAL REFLUX DISEASE 11/02/2007   Diverticulosis of large intestine 11/02/2007   Benign prostatic hyperplasia with urinary obstruction 11/02/2007   SYNCOPE 11/02/2007   Hereditary and idiopathic peripheral neuropathy 10/30/2007   IRRITABLE BOWEL SYNDROME 10/30/2007   Osteoarthritis 10/30/2007   Social History   Socioeconomic History   Marital status: Married    Spouse name: Primary school teacher   Number of children: Not on file   Years of education: Not on file   Highest education level: Not on file  Occupational History   Occupation: Retired    Fish farm manager: RETIRED  Tobacco Use   Smoking status: Never   Smokeless tobacco: Never  Vaping Use   Vaping Use: Never used  Substance and Sexual Activity   Alcohol use: Yes    Alcohol/week: 0.0 standard drinks    Comment: occasionally    Drug use: No   Sexual activity: Never    Birth control/protection: Abstinence  Other Topics Concern   Not on file  Social History Narrative   Not on file   Social Determinants of Health   Financial Resource Strain: Not on file  Food Insecurity: Not on file  Transportation Needs: Not on file  Physical Activity: Not on file  Stress: Not on file  Social Connections: Not on file  Intimate Partner Violence: Not on file    Mr. Kase family history includes Bladder Cancer in his father; CVA in his  sister; Congestive Heart Failure in his sister; Coronary artery disease in his father and mother; Diabetes in his sister and sister; Heart Problems in his father; Prostate cancer in his father.      Objective:    Vitals:   04/04/21 1332  BP: 120/60  Pulse: 91    Physical Exam Well-developed well-nourished elderly WM in no acute distress.  Height, Weight,224  BMI 34.06  HEENT; nontraumatic normocephalic, EOMI, PE R LA, sclera anicteric. Oropharynx;not done today Neck; supple, no JVD Cardiovascular; regular rate and rhythm with S1-S2, no murmur rub or gallop Pulmonary; Clear bilaterally Abdomen; soft,obese  nontender, nondistended, no palpable mass or hepatosplenomegaly, bowel sounds are active Rectal;not done today - recent hemosure + Skin; benign exam, no jaundice rash or appreciable lesions Extremities; no clubbing cyanosis or edema skin warm and dry Neuro/Psych; alert and oriented x4, grossly nonfocal mood and affect appropriate  Assessment & Plan:   #53 87 year old white male with new diagnosis of iron deficiency anemia, and setting of chronic Xarelto use.  Recent Hemosure positive.  Patient had a drop in hemoglobin from 12.8-9.7 over the past 5 months.  Etiology of slow GI blood loss not clear.  He did have moderate gastritis noted at EGD December 2021, and in setting of ongoing use of naproxen and Xarelto, he may have persistent chronic gastropathy.  Rule out small bowel etiology i.e. AVMs or other chronic inflammatory process.  #2 diverticulosis #3 chronic anticoagulation-on Xarelto secondary to factor V Lieden deficiency #4 prior history of DVT 5.  Sleep apnea 6.  History of IBS 7.  Status post pacemaker  Plan; Patient has already been scheduled for capsule endoscopy which will be done tomorrow 04/05/2021 Continue Protonix 40 mg p.o. twice daily AC breakfast and AC dinner Patient advised to stop Naproxen altogether He asks about tramadol, which should be fine for  as needed use with osteoarthritis Proceed with second iron infusion next week Will repeat CBC today and will need to continue to follow serially to assure stability Further recommendations pending results of capsule endoscopy.  Bryton Romagnoli S Tonnya Garbett PA-C 04/04/2021   Cc: Velna Hatchet, MD

## 2021-04-05 ENCOUNTER — Encounter (HOSPITAL_COMMUNITY): Payer: Medicare HMO

## 2021-04-05 ENCOUNTER — Encounter: Payer: Self-pay | Admitting: Internal Medicine

## 2021-04-05 ENCOUNTER — Ambulatory Visit (INDEPENDENT_AMBULATORY_CARE_PROVIDER_SITE_OTHER): Payer: Medicare HMO | Admitting: Internal Medicine

## 2021-04-05 DIAGNOSIS — D509 Iron deficiency anemia, unspecified: Secondary | ICD-10-CM | POA: Diagnosis not present

## 2021-04-05 NOTE — Patient Instructions (Signed)
Contact our office immediately at [phone] if you suffer from any abdominal pain, nausea, or vomiting during capsule endoscopy. °a) Do not eat or drink for at least 2 hours. °After 2 hours you may have any of the following to drink: °Water   White grape juice °7-Up   Chicken Bouillon °Sprite   Ginger Ale °c) After 4 hours you may have a light snack to include any of the following: °A cup of soup  ½ sandwich °Bowl of cereal  Rice °Toast   Eggs °2-3 small cookies (i.e. vanilla wafers or graham crackers) °d) After 8 hours you may return to your regular diet. °During your procedure do not go near anyone else that is having capsule endoscopy. °Do not be in close contact with an MRI machine or a radio or television tower. °Do not wear a heavy coat or sweater because your recorder may over heat and stop recording.   °Do not disconnect the equipment or remove the belt at any time.  Since the Data Recorder is actually a small computer, it should be treated with utmost care and protection.  Avoid sudden movement and banging of the Data Recorder.  Do not do any heavy lifting or strenuous physical activity during the test especially if it involves sweating and do not bend over or stoop during capsule endoscopy. °During capsule endoscopy, you will need to verify every 15 minutes that the small light on top of the Data Recorder is blinking twice per second.  If for some reason it stops blinking at this site, record the time and contact our office at [phone]. ° ° ° °

## 2021-04-05 NOTE — Progress Notes (Signed)
SN: [id number] Exp: 2022-06-14 [id number] Patient arrived for Capsule Endoscopy. Reported the prep went well. This nurse explained dietary restrictions for the next few hours. Patient verbalized understanding. Opened capsule, ensured capsule was flashing prior to the patient swallowing the capsule. Patient swallowed capsule without difficulty. Patient instructed to return to the office at 4:00 pm today for removal of the recording equipment, to call the office with any questions and if no capsule was visualized after 72 hours. No further questions by the conclusion of the visit.

## 2021-04-05 NOTE — Progress Notes (Signed)
Addendum: Reviewed and agree with assessment and management plan. Asya Derryberry M, MD  

## 2021-04-06 ENCOUNTER — Other Ambulatory Visit: Payer: Self-pay

## 2021-04-06 DIAGNOSIS — D509 Iron deficiency anemia, unspecified: Secondary | ICD-10-CM

## 2021-04-06 DIAGNOSIS — I10 Essential (primary) hypertension: Secondary | ICD-10-CM

## 2021-04-09 ENCOUNTER — Ambulatory Visit (HOSPITAL_COMMUNITY)
Admission: RE | Admit: 2021-04-09 | Discharge: 2021-04-09 | Disposition: A | Discharge status: Home / Self Care | Payer: Medicare HMO | Source: Ambulatory Visit | Attending: Internal Medicine | Admitting: Internal Medicine

## 2021-04-09 DIAGNOSIS — D649 Anemia, unspecified: Secondary | ICD-10-CM | POA: Insufficient documentation

## 2021-04-09 MED ORDER — SODIUM CHLORIDE 0.9 % IV SOLN
510.0000 mg | INTRAVENOUS | Status: DC
  Administered 2021-04-09: 510 mg via INTRAVENOUS
  Filled 2021-04-09: qty 510

## 2021-04-12 ENCOUNTER — Telehealth: Payer: Self-pay | Admitting: Internal Medicine

## 2021-04-12 NOTE — Telephone Encounter (Signed)
Patient sent message to scheduling stating,   "my lower BP reading is often in the 50s or 60s while my top reading is often under 100 which makes me wonder if I should be taking two blood pressure pills to lower my BP, thinking that the ideal is 120/80. In warmer weather, I felt very light-headed (like loss of blood to my brain) when sitting after standing while golfing.. This is my concern"   Please advise.

## 2021-04-12 NOTE — Telephone Encounter (Signed)
Attempted phone call and left message to contact RN at [phone].

## 2021-04-13 NOTE — Telephone Encounter (Signed)
Spoke with pt who reports intermittent low BP over the past 2 months.  Diastolic BP can be in the 50's or 0000000 and systolic BP at times [id number] or slightly below.  Pt recently diagnosed with amenia Hgb of 9.  Pt is now receiving  iron infusions.  He denies edema, SOB or dizziness/fainting.  Pt taking medications as prescribed and monitors BP almost daily.  He states he hydrates with sweet tea and water and is on a low salt diet.  He continues to play golf.  Pt states he is doing well other than some fatigue which he contributes to the new anemia. Pt advised anemia could be contributing to hypotension as he was hypertensive.  Pt should continue monitoring his BP daily, hydrate with water and have a salty snack when BP lower.  Advised to change positions slowly.  Will forward to scheduling as pt is due for follow up with Dr Caryl Comes in December 2022.  Encouraged pt to notify his PCP of blood pressure issues as well to assist in management.  Will forward to Dr Caryl Comes for review as well.  Pt verbalizes understanding and agrees with current plan.

## 2021-04-13 NOTE — Telephone Encounter (Signed)
Attempted phone call to pt.  Left voicemail message to contact RN at [phone]. 

## 2021-04-13 NOTE — Telephone Encounter (Signed)
Pt returning phone call... please advise  

## 2021-04-17 ENCOUNTER — Other Ambulatory Visit: Payer: Self-pay

## 2021-04-17 ENCOUNTER — Telehealth: Payer: Self-pay

## 2021-04-17 DIAGNOSIS — D509 Iron deficiency anemia, unspecified: Secondary | ICD-10-CM

## 2021-04-17 NOTE — Telephone Encounter (Signed)
Orders placed for CBC; Reminder placed for 6 weeks for additional labs. Left message for pt to call back.

## 2021-04-17 NOTE — Telephone Encounter (Signed)
I spoke with patient and his wife by phone and informed him as to the video capsule results He had iron deficiency anemia recently and completed his second Feraheme infusion on 04/09/2021 He is feeling better  He had NSAID related erosions and 1 small ulcer in the small bowel by video capsule Nothing that appeared malignant or like Crohn's  Plan would be to remain off NSAIDs I would like him to return on 04/30/2021 for CBC I would like him to return on or around 05/29/2021 for ferritin plus IBC panel and CBC  Time provided for questions and answers and he thanked me for the call

## 2021-04-18 NOTE — Telephone Encounter (Signed)
Left Message for pt to call back X 2

## 2021-04-19 NOTE — Telephone Encounter (Signed)
Pt notified of Dr. Garth Schlatter recommendations. Pt notified of the dates for the labs that need to be collected. Pt verbalized understanding. Labs and reminder entered into Epic.

## 2021-04-26 ENCOUNTER — Encounter: Payer: Self-pay | Admitting: Sports Medicine

## 2021-04-26 ENCOUNTER — Other Ambulatory Visit: Payer: Self-pay

## 2021-04-26 ENCOUNTER — Ambulatory Visit: Payer: Medicare HMO | Admitting: Sports Medicine

## 2021-04-26 DIAGNOSIS — D689 Coagulation defect, unspecified: Secondary | ICD-10-CM | POA: Diagnosis not present

## 2021-04-26 DIAGNOSIS — M79674 Pain in right toe(s): Secondary | ICD-10-CM

## 2021-04-26 DIAGNOSIS — M79675 Pain in left toe(s): Secondary | ICD-10-CM | POA: Diagnosis not present

## 2021-04-26 DIAGNOSIS — G629 Polyneuropathy, unspecified: Secondary | ICD-10-CM

## 2021-04-26 DIAGNOSIS — B351 Tinea unguium: Secondary | ICD-10-CM

## 2021-04-26 NOTE — Progress Notes (Signed)
Subjective: Tony Cruz is a 87 y.o. male patient seen today in office with complaint of mildly painful thickened and elongated toenails; unable to trim. Reports that he thinks that he snagged his right fifth toenail while putting on a sock.  Denies any changes with meds, still on Xarelto like before,  Patient Active Problem List   Diagnosis Date Noted   Osteoarthritis of right knee 12/03/2019   Pain in right knee 12/30/2017   Iron deficiency anemia 12/11/2017   Toe ulcer ([place]) 01/13/2017   Heterozygous factor V Leiden mutation ([place]) 01/13/2017   DVT (deep venous thrombosis) ([place]) 08/22/2016   History of skull fracture 10/19/2015   History of subdural hematoma 10/19/2015   Headache(784.0) 09/30/2013   Sinoatrial node dysfunction ([place]) 06/01/2013   Pacemaker-MDT 07/02/2011   Night sweats 07/02/2011   DIZZINESS 03/22/2010   Impaired fasting glucose 11/02/2009   Obstructive sleep apnea 11/21/2008   Backache 11/03/2008   Overweight 11/02/2007   Essential hypertension 11/02/2007   ALLERGIC RHINITIS 11/02/2007   GASTROESOPHAGEAL REFLUX DISEASE 11/02/2007   Diverticulosis of large intestine 11/02/2007   Benign prostatic hyperplasia with urinary obstruction 11/02/2007   SYNCOPE 11/02/2007   Hereditary and idiopathic peripheral neuropathy 10/30/2007   IRRITABLE BOWEL SYNDROME 10/30/2007   Osteoarthritis 10/30/2007    Current Outpatient Medications on File Prior to Visit  Medication Sig Dispense Refill   acetaminophen (TYLENOL) 500 MG tablet Take 500-1,000 mg by mouth every 6 (six) hours as needed for headache (or pain).      ascorbic acid (VITAMIN C) 500 MG tablet Take 500 mg by mouth daily.      calcium carbonate (OS-CAL) 600 MG TABS Take 600 mg by mouth daily.     chlorthalidone (HYGROTON) 25 MG tablet Take 1 tablet (25 mg total) by mouth daily. 90 tablet 3   cholecalciferol (VITAMIN D3) 25 MCG (1000 UNIT) tablet Take 1,000 Units by mouth daily.     ferrous sulfate 325 (65  FE) MG tablet Take 325 mg by mouth 2 (two) times daily with a meal.      finasteride (PROSCAR) 5 MG tablet Take 1 tablet by mouth daily.     gabapentin (NEURONTIN) 100 MG capsule      Lecithin 1200 MG CAPS Take 1 capsule by mouth daily.     loratadine (CLARITIN) 10 MG tablet Take 10 mg by mouth daily.     losartan (COZAAR) 100 MG tablet Take 1 tablet (100 mg total) by mouth daily. 90 tablet 3   Multiple Vitamin (MULTIVITAMIN) capsule Take 1 capsule by mouth daily.     naproxen (NAPROSYN) 500 MG tablet TAKE 1 TABLET TWICE A DAY 180 tablet 3   pantoprazole (PROTONIX) 40 MG tablet TAKE ONE TABLET DAILY. MAKEAN APPOINTMENT FOR FURTHER REFILLS 90 tablet 3   tamsulosin (FLOMAX) 0.4 MG CAPS capsule Take 0.4 mg by mouth daily.     traMADol (ULTRAM) 50 MG tablet Take 50 mg by mouth 3 (three) times daily as needed.     valACYclovir (VALTREX) 1000 MG tablet Take 1,000 mg by mouth 3 (three) times daily.     XARELTO 15 MG TABS tablet TAKE 1 TABLET DAILY WITH   SUPPER 90 tablet 0   Current Facility-Administered Medications on File Prior to Visit  Medication Dose Route Frequency Provider Last Rate Last Admin   0.9 %  sodium chloride infusion  500 mL Intravenous Continuous Pyrtle, Lajuan Lines, MD        Allergies  Allergen Reactions   Shellfish  Allergy Rash    Sensitive to shellfish    Objective: Physical Exam  General: Well developed, nourished, no acute distress, awake, alert and oriented x 3  Vascular: Dorsalis pedis artery 1/4 bilateral, Posterior tibial artery 0/4 bilateral, skin temperature warm to warm proximal to distal bilateral lower extremities due to trace edema, + varicosities, pedal hair present bilateral.  Neurological: Gross sensation present via light touch bilateral.   Dermatological: Skin is warm, dry, and supple bilateral, Nails 1-10 are tender, long, thick, and discolored with mild subungal debris, right fifth toenail is partially lysed with mild soft tissue swelling no active  drainage or acute infection noted, +keratosis to left 2nd toe. No signs of infection bilateral.  Musculoskeletal:  Asymptomatic claw/hammertoes boney deformities noted bilateral. Muscular strength within normal limits without painon range of motion. No pain with calf compression bilateral.  Assessment and Plan:  Problem List Items Addressed This Visit   None Visit Diagnoses     Pain due to onychomycosis of toenails of both feet    -  Primary   Neuropathy       Coagulation defect ([place])           -Examined patient.  -Re-Discussed with patient importance of daily foot inspection in the setting of neuropathy -Mechanically debrided and reduced mycotic nails with sterile nail nipper and dremel nail file without incident. -Applied antibiotic cream and Band-Aid dressing to right fifth toe and advised patient to do the same once weekly until healed -Patient to return in 3 months for follow up evaluation or sooner if symptoms worsen.  Landis Martins, DPM

## 2021-04-30 ENCOUNTER — Other Ambulatory Visit (INDEPENDENT_AMBULATORY_CARE_PROVIDER_SITE_OTHER): Payer: Medicare HMO

## 2021-04-30 ENCOUNTER — Other Ambulatory Visit: Payer: Self-pay

## 2021-04-30 DIAGNOSIS — D509 Iron deficiency anemia, unspecified: Secondary | ICD-10-CM

## 2021-04-30 LAB — CBC WITH DIFFERENTIAL/PLATELET
Basophils Absolute: 0.1 10*3/uL (ref 0.0–0.1)
Basophils Relative: 2.1 % (ref 0.0–3.0)
Eosinophils Absolute: 0.8 10*3/uL — ABNORMAL HIGH (ref 0.0–0.7)
Eosinophils Relative: 11.8 % — ABNORMAL HIGH (ref 0.0–5.0)
HCT: 39.3 % (ref 39.0–52.0)
Hemoglobin: 13.1 g/dL (ref 13.0–17.0)
Lymphocytes Relative: 25.8 % (ref 12.0–46.0)
Lymphs Abs: 1.7 10*3/uL (ref 0.7–4.0)
MCHC: 33.4 g/dL (ref 30.0–36.0)
MCV: 94.9 fl (ref 78.0–100.0)
Monocytes Absolute: 0.8 10*3/uL (ref 0.1–1.0)
Monocytes Relative: 11.6 % (ref 3.0–12.0)
Neutro Abs: 3.3 10*3/uL (ref 1.4–7.7)
Neutrophils Relative %: 48.7 % (ref 43.0–77.0)
Platelets: 262 10*3/uL (ref 150.0–400.0)
RBC: 4.14 Mil/uL — ABNORMAL LOW (ref 4.22–5.81)
RDW: 27.3 % — ABNORMAL HIGH (ref 11.5–15.5)
WBC: 6.7 10*3/uL (ref 4.0–10.5)

## 2021-05-02 ENCOUNTER — Ambulatory Visit (INDEPENDENT_AMBULATORY_CARE_PROVIDER_SITE_OTHER): Payer: Medicare HMO | Admitting: Neurology

## 2021-05-02 DIAGNOSIS — R351 Nocturia: Secondary | ICD-10-CM

## 2021-05-02 DIAGNOSIS — Z9989 Dependence on other enabling machines and devices: Secondary | ICD-10-CM

## 2021-05-02 DIAGNOSIS — G4733 Obstructive sleep apnea (adult) (pediatric): Secondary | ICD-10-CM | POA: Diagnosis not present

## 2021-05-02 DIAGNOSIS — E669 Obesity, unspecified: Secondary | ICD-10-CM

## 2021-05-07 NOTE — Progress Notes (Signed)
See procedure note.

## 2021-05-09 ENCOUNTER — Encounter: Payer: Self-pay | Admitting: Neurology

## 2021-05-09 NOTE — Procedures (Signed)
     [facility]  HOME SLEEP TEST (Watch PAT) REPORT  STUDY DATE: 05/02/2021  DOB: June 05, 1934  MRN: [id number]  ORDERING CLINICIAN: Star Age, MD, PhD   REFERRING CLINICIAN: Velna Hatchet, MD   CLINICAL INFORMATION/HISTORY: 87 year old right-handed gentleman with an underlying medical history of arthritis, status post right total knee replacement in May 2021, BPH, DVT, reflux disease, factor V Leiden deficiency, hypertension, irritable bowel syndrome, diverticulosis, syncope, status post pacemaker, neuropathy, allergies, anemia, and obesity, who was previously diagnosed with obstructive sleep apnea and placed on CPAP therapy.  He presents for reevaluation as he also qualifies for a new equipment, his current machine is old and the motor life has been exceeded.  Epworth sleepiness score: 5/24.  BMI: 33 kg/m  FINDINGS:   Sleep Summary:   Total Recording Time (hours, min): 7 hours, 12 minutes  Total Sleep Time (hours, min):  6 hours, 7 minutes   Percent REM (%):    11.1%   Respiratory Indices:   Calculated pAHI (per hour):  25.8/hour         REM pAHI:    13.6/hour       NREM pAHI: 27.3/hour  Oxygen Saturation Statistics:    Oxygen Saturation (%) Mean: 93%   Minimum oxygen saturation (%):                 79%   O2 Saturation Range (%): 79-98%    O2 Saturation (minutes) <=88%: 5.4 min  Pulse Rate Statistics:   Pulse Mean (bpm):    72/min    Pulse Range (37-93/min)   IMPRESSION: OSA (obstructive sleep apnea)   RECOMMENDATION:  This home sleep test demonstrates moderate obstructive sleep apnea with a total AHI of 25.8/hour and O2 nadir of 79%.  Snoring was detected, in the mild to moderate range for the most part.  Ongoing treatment with positive airway pressure is recommended. The patient will be advised to proceed with an autoPAP titration/trial at home for now. A full night titration study may be considered to optimize treatment settings, if  needed down the road. Please note that untreated obstructive sleep apnea may carry additional perioperative morbidity. Patients with significant obstructive sleep apnea should receive perioperative PAP therapy and the surgeons and particularly the anesthesiologist should be informed of the diagnosis and the severity of the sleep disordered breathing. The patient should be cautioned not to drive, work at heights, or operate dangerous or heavy equipment when tired or sleepy. Review and reiteration of good sleep hygiene measures should be pursued with any patient. Other causes of the patient's symptoms, including circadian rhythm disturbances, an underlying mood disorder, medication effect and/or an underlying medical problem cannot be ruled out based on this test. Clinical correlation is recommended. The patient and his referring provider will be notified of the test results. The patient will be seen in follow up in sleep clinic at [facility].  I certify that I have reviewed the raw data recording prior to the issuance of this report in accordance with the standards of the American Academy of Sleep Medicine (AASM).   INTERPRETING PHYSICIAN:   Star Age, MD, PhD  Board Certified in Neurology and Sleep Medicine  [facility] [address], [place], [place] [address] [phone]

## 2021-05-09 NOTE — Addendum Note (Signed)
Addended by: Star Age on: 05/09/2021 05:29 PM   Modules accepted: Orders

## 2021-05-10 NOTE — Telephone Encounter (Signed)
Star Age, MD  05/09/2021  5:29 PM EDT Back to Top    Patient referred by Dr. Ardeth Perfect, seen by me on 03/20/2021 for reevaluation of his OSA.  He has an older CPAP machine and the motor life has been exceeded.  He had a home sleep test on 05/02/2021.     Please call and notify the patient that the recent home sleep test showed obstructive sleep apnea in the moderate range. I recommend treatment in the form of autoPAP, which means, that we don't have to bring him in for a sleep study with CPAP, but will let him start using a so called autoPAP machine at home, which is a CPAP-like machine with self-adjusting pressures. We will send the order to a local DME company (of his choice, or as per insurance requirement). The DME representative will fit him with a mask, educate him on how to use the machine, how to put the mask on, etc. I have placed an order in the chart. Please send the order, talk to patient, send report to referring MD. We will need a FU in sleep clinic for 10 weeks post-PAP set up, please arrange that with me or one of our NPs. Also reinforce the need for compliance with treatment. Thanks,    Star Age, MD, PhD [facility])

## 2021-05-21 NOTE — Telephone Encounter (Signed)
Order, insurance, office note, sleep study result, etc faxed to [facility]. Received a receipt of confirmation. Also sent secure community message to [facility] team.

## 2021-05-22 ENCOUNTER — Ambulatory Visit (INDEPENDENT_AMBULATORY_CARE_PROVIDER_SITE_OTHER): Payer: Medicare HMO

## 2021-05-22 DIAGNOSIS — I495 Sick sinus syndrome: Secondary | ICD-10-CM

## 2021-05-22 LAB — CUP PACEART REMOTE DEVICE CHECK
Date Time Interrogation Session: 20221025091428
Implantable Lead Implant Date: 19990330
Implantable Lead Implant Date: 19990330
Implantable Lead Location: 753859
Implantable Lead Location: 753860
Implantable Lead Model: 5068
Implantable Lead Model: 5092
Implantable Pulse Generator Implant Date: 20181024
Pulse Gen Model: 407145
Pulse Gen Serial Number: 69203177

## 2021-05-30 NOTE — Progress Notes (Signed)
Remote pacemaker transmission.   

## 2021-05-31 ENCOUNTER — Other Ambulatory Visit: Payer: Self-pay

## 2021-05-31 ENCOUNTER — Other Ambulatory Visit (INDEPENDENT_AMBULATORY_CARE_PROVIDER_SITE_OTHER): Payer: Medicare HMO

## 2021-05-31 DIAGNOSIS — D509 Iron deficiency anemia, unspecified: Secondary | ICD-10-CM | POA: Diagnosis not present

## 2021-05-31 LAB — CBC WITH DIFFERENTIAL/PLATELET
Basophils Absolute: 0.1 10*3/uL (ref 0.0–0.1)
Basophils Relative: 1.3 % (ref 0.0–3.0)
Eosinophils Absolute: 1.1 10*3/uL — ABNORMAL HIGH (ref 0.0–0.7)
Eosinophils Relative: 14.4 % — ABNORMAL HIGH (ref 0.0–5.0)
HCT: 39 % (ref 39.0–52.0)
Hemoglobin: 13.2 g/dL (ref 13.0–17.0)
Lymphocytes Relative: 29.3 % (ref 12.0–46.0)
Lymphs Abs: 2.2 10*3/uL (ref 0.7–4.0)
MCHC: 33.9 g/dL (ref 30.0–36.0)
MCV: 95.9 fl (ref 78.0–100.0)
Monocytes Absolute: 0.7 10*3/uL (ref 0.1–1.0)
Monocytes Relative: 9.5 % (ref 3.0–12.0)
Neutro Abs: 3.4 10*3/uL (ref 1.4–7.7)
Neutrophils Relative %: 45.5 % (ref 43.0–77.0)
Platelets: 261 10*3/uL (ref 150.0–400.0)
RBC: 4.07 Mil/uL — ABNORMAL LOW (ref 4.22–5.81)
RDW: 22.3 % — ABNORMAL HIGH (ref 11.5–15.5)
WBC: 7.6 10*3/uL (ref 4.0–10.5)

## 2021-05-31 LAB — IBC + FERRITIN
Ferritin: 38.8 ng/mL (ref 22.0–322.0)
Iron: 115 ug/dL (ref 42–165)
Saturation Ratios: 31.6 % (ref 20.0–50.0)
TIBC: 364 ug/dL (ref 250.0–450.0)
Transferrin: 260 mg/dL (ref 212.0–360.0)

## 2021-06-26 DIAGNOSIS — H40013 Open angle with borderline findings, low risk, bilateral: Secondary | ICD-10-CM | POA: Diagnosis not present

## 2021-06-26 DIAGNOSIS — H40053 Ocular hypertension, bilateral: Secondary | ICD-10-CM | POA: Diagnosis not present

## 2021-06-27 DIAGNOSIS — G4733 Obstructive sleep apnea (adult) (pediatric): Secondary | ICD-10-CM | POA: Diagnosis not present

## 2021-07-24 ENCOUNTER — Encounter: Payer: Medicare HMO | Admitting: Internal Medicine

## 2021-07-31 ENCOUNTER — Ambulatory Visit: Payer: Medicare HMO | Admitting: Internal Medicine

## 2021-07-31 ENCOUNTER — Encounter: Payer: Self-pay | Admitting: Internal Medicine

## 2021-07-31 ENCOUNTER — Other Ambulatory Visit: Payer: Self-pay

## 2021-07-31 VITALS — BP 130/62 | HR 87 | Ht 68.0 in | Wt 231.4 lb

## 2021-07-31 DIAGNOSIS — Z950 Presence of cardiac pacemaker: Secondary | ICD-10-CM | POA: Diagnosis not present

## 2021-07-31 DIAGNOSIS — I495 Sick sinus syndrome: Secondary | ICD-10-CM

## 2021-07-31 NOTE — Progress Notes (Deleted)
Patient Care Team: Velna Hatchet, MD as PCP - General (Internal Medicine) Deboraha Sprang, MD as PCP - Electrophysiology (Cardiology) Deboraha Sprang, MD as PCP - Cardiology (Cardiology)   HPI  Tony Cruz is a 88 y.o. male is seen in followup for syncope secondary to intermittent sinus node dysfunction (presumed neurally) for which a pacemaker-Biotronik was remotely implanted w/ change out 2008, 2018.     Date Cr K Hgb  6/19 0.86 4.3 14.5  1/20  0.77 4.3 13.0  7/20 0.9 4.3 13.8  4/22 1.02 4.1 13.2   The patient denies chest pain***, shortness of breath***, nocturnal dyspnea***, orthopnea*** or peripheral edema***.  There have been no palpitations***, lightheadedness*** or syncope***.  Complains of ***.   Xarelto for DVT and F V Leiden  Playing Golf 3-4 days per week   No chest pain. Past Medical History:  Diagnosis Date   Allergy    Anal fissure    Anemia    Arthritis    diffuse arthritis   Backache, unspecified    Barrett's esophagus    BPH (benign prostatic hyperplasia)    Diverticulosis of colon (without mention of hemorrhage)    DVT of leg (deep venous thrombosis) (H[facility])    Esophageal reflux    Factor V Leiden deficiency (H[facility])    GERD (gastroesophageal reflux disease)    hypertension    Hypertrophy of prostate with urinary obstruction and other lower urinary tract symptoms (LUTS)    Irritable bowel syndrome    Obstructive sleep apnea    Osteoarthrosis, unspecified whether generalized or localized, unspecified site    Other abnormal glucose    Overweight(278.02)    Pacemaker-MDT    Change out 2008   Sleep apnea 2016   Uses cpap   Syncope and collapse    Tubular adenoma of colon    Unspecified hereditary and idiopathic peripheral neuropathy     Past Surgical History:  Procedure Laterality Date   APPENDECTOMY     CHOLECYSTECTOMY     PACEMAKER PLACEMENT     PPM GENERATOR CHANGEOUT N/A 05/21/2017   Procedure: PPM GENERATOR CHANGEOUT;   Surgeon: Deboraha Sprang, MD;  Location: [facility];  Service: Cardiovascular;  Laterality: N/A;   skin cancer removed  12/2012   removed from his face--Dr. [place] Right 12/03/2019   Procedure: TOTAL KNEE ARTHROPLASTY;  Surgeon: Sydnee Cabal, MD;  Location: [facility];  Service: Orthopedics;  Laterality: Right;  adductor canal    Current Outpatient Medications  Medication Sig Dispense Refill   acetaminophen (TYLENOL) 500 MG tablet Take 500-1,000 mg by mouth every 6 (six) hours as needed for headache (or pain).      ascorbic acid (VITAMIN C) 500 MG tablet Take 500 mg by mouth daily.      calcium carbonate (OS-CAL) 600 MG TABS Take 600 mg by mouth daily.     chlorthalidone (HYGROTON) 25 MG tablet Take 1 tablet (25 mg total) by mouth daily. 90 tablet 3   cholecalciferol (VITAMIN D3) 25 MCG (1000 UNIT) tablet Take 1,000 Units by mouth daily.     ferrous sulfate 325 (65 FE) MG tablet Take 325 mg by mouth 2 (two) times daily with a meal.      finasteride (PROSCAR) 5 MG tablet Take 1 tablet by mouth daily.     gabapentin (NEURONTIN) 100 MG capsule      Lecithin 1200 MG CAPS Take 1  capsule by mouth daily.     loratadine (CLARITIN) 10 MG tablet Take 10 mg by mouth daily.     losartan (COZAAR) 100 MG tablet Take 1 tablet (100 mg total) by mouth daily. 90 tablet 3   Multiple Vitamin (MULTIVITAMIN) capsule Take 1 capsule by mouth daily.     naproxen (NAPROSYN) 500 MG tablet TAKE 1 TABLET TWICE A DAY 180 tablet 3   pantoprazole (PROTONIX) 40 MG tablet TAKE ONE TABLET DAILY. MAKEAN APPOINTMENT FOR FURTHER REFILLS 90 tablet 3   tamsulosin (FLOMAX) 0.4 MG CAPS capsule Take 0.4 mg by mouth daily.     traMADol (ULTRAM) 50 MG tablet Take 50 mg by mouth 3 (three) times daily as needed.     valACYclovir (VALTREX) 1000 MG tablet Take 1,000 mg by mouth 3 (three) times daily.     XARELTO 15 MG TABS tablet TAKE 1 TABLET DAILY WITH   SUPPER 90 tablet 0   Current  Facility-Administered Medications  Medication Dose Route Frequency Provider Last Rate Last Admin   0.9 %  sodium chloride infusion  500 mL Intravenous Continuous Pyrtle, Lajuan Lines, MD        Allergies  Allergen Reactions   Shellfish Allergy Rash    Sensitive to shellfish    Review of Systems negative except from HPI and PMH  Physical Exam There were no vitals taken for this visit. Well developed and well nourished in no acute distress HENT normal Neck supple with JVP-flat Clear Device pocket well healed; without hematoma or erythema.  There is no tethering  Regular rate and rhythm, no *** gallop No ***/*** murmur Abd-soft with active BS No Clubbing cyanosis *** edema Skin-warm and dry A & Oriented  Grossly normal sensory and motor function  ECG ***   Assessment and plan  Syncope presumed neurally mediated  Sinus node dysfunction-prob neurally   Pacemaker Biotonik   Hypertension   Balance Neuropathy  DVT---- Factor V Leiden  Anemia --Fe Rx-- GI pl  Significant edema.  I wonder whether it is related to the amlodipine.  We will discontinue the amlodipine and have him resume his chlorthalidone.  We will check a metabolic profile in 3 weeks time.  They have been eating out more with Covid.  Have suggested being more attentive to sodium intake.  No bleeding on his Xarelto  No interval syncope  We spent more than 50% of our >30 min visit in face to face counseling regarding the above

## 2021-07-31 NOTE — Patient Instructions (Signed)

## 2021-07-31 NOTE — Progress Notes (Signed)
Patient Care Team: Velna Hatchet, MD as PCP - General (Internal Medicine) Deboraha Sprang, MD as PCP - Electrophysiology (Cardiology) Deboraha Sprang, MD as PCP - Cardiology (Cardiology)   HPI  Tony Cruz is a 88 y.o. male is seen in followup for syncope secondary to intermittent sinus node dysfunction (presumed neurally) for which a pacemaker-Biotronik was remotely implanted w/ change out 2008, 2018.   DATE TEST EF   10/17 CTA  Ao 42 cm  5/22 Echo   60-65% %              Date Cr K Hgb  6/19 0.86 4.3 14.5  1/20  0.77 4.3 13.0  7/20 0.9 4.3 13.8  4/22 1.02 4.1 13.2  8/22  4.7 13.2   Xarelto for DVT and F V Leiden       Today, he says that he's been well. Marland Kitchen   He says that over the summer in 2 degree weather, while he was golfing, he says that he would start to feel faint. His diastolic pressure would have a lower number like in the 50's, today it was in the 60's. He states that he tries to stay hydrated with 4 bottles of water.   He reports also is heart rate has increased over the decades.  The patient denies chest pain, shortness of breath, nocturnal dyspnea, orthopnea .  There has dental appointment abnormal on tomorrow at 11, how about 930 been no palpitations or syncope.   He has a little swelling in lower extremities. He sleeps in his flat bed with pillows under his legs. He used CPAP machine when he sleeps which removes his snoring.  His diet excludes salt, bread. He has bacon and cheese.     Past Medical History:  Diagnosis Date   Allergy    Anal fissure    Anemia    Arthritis    diffuse arthritis   Backache, unspecified    Barrett's esophagus    BPH (benign prostatic hyperplasia)    Diverticulosis of colon (without mention of hemorrhage)    DVT of leg (deep venous thrombosis) (HCC)    Esophageal reflux    Factor V Leiden deficiency (HCC)    GERD (gastroesophageal reflux disease)    hypertension    Hypertrophy of prostate with  urinary obstruction and other lower urinary tract symptoms (LUTS)    Irritable bowel syndrome    Obstructive sleep apnea    Osteoarthrosis, unspecified whether generalized or localized, unspecified site    Other abnormal glucose    Overweight(278.02)    Pacemaker-MDT    Change out 2008   Sleep apnea 2016   Uses cpap   Syncope and collapse    Tubular adenoma of colon    Unspecified hereditary and idiopathic peripheral neuropathy     Past Surgical History:  Procedure Laterality Date   APPENDECTOMY     CHOLECYSTECTOMY     PACEMAKER PLACEMENT     PPM GENERATOR CHANGEOUT N/A 05/21/2017   Procedure: PPM GENERATOR CHANGEOUT;  Surgeon: Deboraha Sprang, MD;  Location: [facility];  Service: Cardiovascular;  Laterality: N/A;   skin cancer removed  12/2012   removed from his face--Dr. [place] Right 12/03/2019   Procedure: TOTAL KNEE ARTHROPLASTY;  Surgeon: Sydnee Cabal, MD;  Location: [facility];  Service: Orthopedics;  Laterality: Right;  adductor canal    Current Outpatient Medications  Medication Sig Dispense  Refill   acetaminophen (TYLENOL) 500 MG tablet Take 500-1,000 mg by mouth every 6 (six) hours as needed for headache (or pain).      ascorbic acid (VITAMIN C) 500 MG tablet Take 500 mg by mouth daily.      calcium carbonate (OS-CAL) 600 MG TABS Take 600 mg by mouth daily.     chlorthalidone (HYGROTON) 25 MG tablet Take 1 tablet (25 mg total) by mouth daily. 90 tablet 3   cholecalciferol (VITAMIN D3) 25 MCG (1000 UNIT) tablet Take 1,000 Units by mouth daily.     ferrous sulfate 325 (65 FE) MG tablet Take 325 mg by mouth 2 (two) times daily with a meal.      finasteride (PROSCAR) 5 MG tablet Take 1 tablet by mouth daily.     gabapentin (NEURONTIN) 100 MG capsule      Lecithin 1200 MG CAPS Take 1 capsule by mouth daily.     loratadine (CLARITIN) 10 MG tablet Take 10 mg by mouth daily.     losartan (COZAAR) 100 MG tablet Take 1 tablet  (100 mg total) by mouth daily. 90 tablet 3   Multiple Vitamin (MULTIVITAMIN) capsule Take 1 capsule by mouth daily.     naproxen (NAPROSYN) 500 MG tablet TAKE 1 TABLET TWICE A DAY 180 tablet 3   pantoprazole (PROTONIX) 40 MG tablet TAKE ONE TABLET DAILY. MAKEAN APPOINTMENT FOR FURTHER REFILLS 90 tablet 3   tamsulosin (FLOMAX) 0.4 MG CAPS capsule Take 0.4 mg by mouth daily.     traMADol (ULTRAM) 50 MG tablet Take 50 mg by mouth 3 (three) times daily as needed.     XARELTO 15 MG TABS tablet TAKE 1 TABLET DAILY WITH   SUPPER 90 tablet 0   valACYclovir (VALTREX) 1000 MG tablet Take 1,000 mg by mouth 3 (three) times daily.     Current Facility-Administered Medications  Medication Dose Route Frequency Provider Last Rate Last Admin   0.9 %  sodium chloride infusion  500 mL Intravenous Continuous Pyrtle, Lajuan Lines, MD        Allergies  Allergen Reactions   Shellfish Allergy Rash and Other (See Comments)    Sensitive to shellfish    Review of Systems negative except from HPI and PMH Please see the history of present illness. (+) Lightheadedness  (+)  All other systems are reviewed and negative.     Physical Exam BP 130/62    Pulse 87    Ht 5\' 8"  (1.727 m)    Wt 231 lb 6.4 oz (105 kg)    SpO2 97%    BMI 35.18 kg/m  Well developed and obese in no acute distress HENT normal Neck supple with JVP-flat ClearDevice pocket well healed; without hematoma or erythema.  There is no tethering  Regular rate and rhythm, no  gallop No murmur Abd-soft with active BS No Clubbing cyanosis 2+ edema Skin-warm and dry A & Oriented  Grossly normal sensory and motor function    07/31/2021 ECG: Atrial pacing at 87 Intervals 20/14/40 Right bundle branch block Right axis deviation 12/21 QRSd 134 ms and axis 34 1/21 QRSd 108 ms and axis -27  Assessment and plan  Syncope presumed neurally mediated  Sinus node dysfunction-prob neurally   Pacemaker Biotonik    Hypertension   Balance  Neuropathy  DVT---- Factor V Leiden  Right bundle branch block-and posterior fascicular block-progressive  Edema is much improved off the amlodipine last year.  We will continue him on chlorthalidone 25.  Electrolytes were  normal  Blood pressures are well controlled.  Diastolics are little bit low.  Continue losartan 100 mg.  I suspect that the lightheadedness when he was playing golf and it was 90 was just the excessive heat and that suggested that he not play when the temperature is above 90 or is exceedingly humid, and that if he insists on playing that he should use a golf cart   On Xarelto 15 for his DVT and factor V Leiden  Progressive conduction system disease.  At this point it is relatively immaterial as he is back up bradycardia pacing.  The issue or interestingly though is why is this true.  No anemia.  No carpal tunnel to suggest amyloid.  We will continue to follow this.        I,Tinashe Williams,acting as a Education administrator for Virl Axe, MD.,have documented all relevant documentation on the behalf of Virl Axe, MD,as directed by  Virl Axe, MD while in the presence of Virl Axe, MD.   I, Virl Axe, MD, have reviewed all documentation for this visit. The documentation on 07/31/21 for the exam, diagnosis, procedures, and orders are all accurate and complete.

## 2021-08-02 ENCOUNTER — Encounter: Payer: Self-pay | Admitting: Sports Medicine

## 2021-08-02 ENCOUNTER — Other Ambulatory Visit: Payer: Self-pay

## 2021-08-02 ENCOUNTER — Ambulatory Visit: Payer: Medicare HMO | Admitting: Sports Medicine

## 2021-08-02 DIAGNOSIS — G629 Polyneuropathy, unspecified: Secondary | ICD-10-CM

## 2021-08-02 DIAGNOSIS — H2513 Age-related nuclear cataract, bilateral: Secondary | ICD-10-CM | POA: Diagnosis not present

## 2021-08-02 DIAGNOSIS — L84 Corns and callosities: Secondary | ICD-10-CM

## 2021-08-02 DIAGNOSIS — M79675 Pain in left toe(s): Secondary | ICD-10-CM

## 2021-08-02 DIAGNOSIS — M79674 Pain in right toe(s): Secondary | ICD-10-CM | POA: Diagnosis not present

## 2021-08-02 DIAGNOSIS — B351 Tinea unguium: Secondary | ICD-10-CM | POA: Diagnosis not present

## 2021-08-02 DIAGNOSIS — H25013 Cortical age-related cataract, bilateral: Secondary | ICD-10-CM | POA: Diagnosis not present

## 2021-08-02 DIAGNOSIS — D689 Coagulation defect, unspecified: Secondary | ICD-10-CM

## 2021-08-02 DIAGNOSIS — H52203 Unspecified astigmatism, bilateral: Secondary | ICD-10-CM | POA: Diagnosis not present

## 2021-08-02 NOTE — Progress Notes (Signed)
Subjective: Tony Cruz is a 88 y.o. male patient seen today in office with complaint of mildly painful thickened and elongated toenails; unable to trim.  Denies any changes with meds, still on Xarelto like before, no other changes.   Patient Active Problem List   Diagnosis Date Noted   Osteoarthritis of right knee 12/03/2019   Pain in right knee 12/30/2017   Iron deficiency anemia 12/11/2017   Toe ulcer ([place]) 01/13/2017   Heterozygous factor V Leiden mutation ([place]) 01/13/2017   DVT (deep venous thrombosis) ([place]) 08/22/2016   History of skull fracture 10/19/2015   History of subdural hematoma 10/19/2015   Headache(784.0) 09/30/2013   Sinoatrial node dysfunction ([place]) 06/01/2013   Pacemaker-MDT 07/02/2011   Night sweats 07/02/2011   DIZZINESS 03/22/2010   Impaired fasting glucose 11/02/2009   Obstructive sleep apnea 11/21/2008   Backache 11/03/2008   Overweight 11/02/2007   Essential hypertension 11/02/2007   ALLERGIC RHINITIS 11/02/2007   GASTROESOPHAGEAL REFLUX DISEASE 11/02/2007   Diverticulosis of large intestine 11/02/2007   Benign prostatic hyperplasia with urinary obstruction 11/02/2007   SYNCOPE 11/02/2007   Hereditary and idiopathic peripheral neuropathy 10/30/2007   IRRITABLE BOWEL SYNDROME 10/30/2007   Osteoarthritis 10/30/2007    Current Outpatient Medications on File Prior to Visit  Medication Sig Dispense Refill   acetaminophen (TYLENOL) 500 MG tablet Take 500-1,000 mg by mouth every 6 (six) hours as needed for headache (or pain).      ascorbic acid (VITAMIN C) 500 MG tablet Take 500 mg by mouth daily.      calcium carbonate (OS-CAL) 600 MG TABS Take 600 mg by mouth daily.     chlorthalidone (HYGROTON) 25 MG tablet Take 1 tablet (25 mg total) by mouth daily. 90 tablet 3   cholecalciferol (VITAMIN D3) 25 MCG (1000 UNIT) tablet Take 1,000 Units by mouth daily.     ferrous sulfate 325 (65 FE) MG tablet Take 325 mg by mouth 2 (two) times daily with a meal.       finasteride (PROSCAR) 5 MG tablet Take 1 tablet by mouth daily.     gabapentin (NEURONTIN) 100 MG capsule      Lecithin 1200 MG CAPS Take 1 capsule by mouth daily.     loratadine (CLARITIN) 10 MG tablet Take 10 mg by mouth daily.     losartan (COZAAR) 100 MG tablet Take 1 tablet (100 mg total) by mouth daily. 90 tablet 3   Multiple Vitamin (MULTIVITAMIN) capsule Take 1 capsule by mouth daily.     naproxen (NAPROSYN) 500 MG tablet TAKE 1 TABLET TWICE A DAY 180 tablet 3   pantoprazole (PROTONIX) 40 MG tablet TAKE ONE TABLET DAILY. MAKEAN APPOINTMENT FOR FURTHER REFILLS 90 tablet 3   tamsulosin (FLOMAX) 0.4 MG CAPS capsule Take 0.4 mg by mouth daily.     traMADol (ULTRAM) 50 MG tablet Take 50 mg by mouth 3 (three) times daily as needed.     valACYclovir (VALTREX) 1000 MG tablet Take 1,000 mg by mouth 3 (three) times daily.     XARELTO 15 MG TABS tablet TAKE 1 TABLET DAILY WITH   SUPPER 90 tablet 0   Current Facility-Administered Medications on File Prior to Visit  Medication Dose Route Frequency Provider Last Rate Last Admin   0.9 %  sodium chloride infusion  500 mL Intravenous Continuous Pyrtle, Lajuan Lines, MD        Allergies  Allergen Reactions   Shellfish Allergy Rash and Other (See Comments)    Sensitive to shellfish  Objective: Physical Exam  General: Well developed, nourished, no acute distress, awake, alert and oriented x 3  Vascular: Dorsalis pedis artery 1/4 bilateral, Posterior tibial artery 0/4 bilateral, skin temperature warm to warm proximal to distal bilateral lower extremities due to trace edema, + varicosities, pedal hair present bilateral.  Neurological: Gross sensation present via light touch bilateral.   Dermatological: Skin is warm, dry, and supple bilateral, Nails 1-10 are tender, long, thick, and discolored with mild subungal debris, +keratosis to left 2nd toe. No signs of infection bilateral.  Musculoskeletal:  Asymptomatic claw/hammertoes boney deformities  noted bilateral. Muscular strength within normal limits without painon range of motion. No pain with calf compression bilateral.  Assessment and Plan:  Problem List Items Addressed This Visit   None Visit Diagnoses     Pain due to onychomycosis of toenails of both feet    -  Primary   Neuropathy       Coagulation defect (HCC)       Callus           -Examined patient.  -Re-Discussed with patient importance of daily foot inspection in the setting of neuropathy -Mechanically debrided and reduced mycotic nails with sterile nail nipper and dremel nail file without incident. -At no charge smoothed callus areas with rotary bur -Patient to return in 3 months for follow up evaluation or sooner if symptoms worsen.  Landis Martins, DPM

## 2021-08-21 ENCOUNTER — Ambulatory Visit (INDEPENDENT_AMBULATORY_CARE_PROVIDER_SITE_OTHER): Payer: Medicare HMO

## 2021-08-21 DIAGNOSIS — I495 Sick sinus syndrome: Secondary | ICD-10-CM | POA: Diagnosis not present

## 2021-08-21 LAB — CUP PACEART REMOTE DEVICE CHECK
Date Time Interrogation Session: 20230124080354
Implantable Lead Implant Date: 19990330
Implantable Lead Implant Date: 19990330
Implantable Lead Location: 753859
Implantable Lead Location: 753860
Implantable Lead Model: 5068
Implantable Lead Model: 5092
Implantable Pulse Generator Implant Date: 20181024
Pulse Gen Model: 407145
Pulse Gen Serial Number: 69203177

## 2021-08-26 DIAGNOSIS — J441 Chronic obstructive pulmonary disease with (acute) exacerbation: Secondary | ICD-10-CM | POA: Diagnosis not present

## 2021-08-26 DIAGNOSIS — I700 Atherosclerosis of aorta: Secondary | ICD-10-CM | POA: Diagnosis not present

## 2021-08-26 DIAGNOSIS — I10 Essential (primary) hypertension: Secondary | ICD-10-CM | POA: Diagnosis not present

## 2021-08-31 ENCOUNTER — Telehealth: Payer: Self-pay

## 2021-08-31 NOTE — Telephone Encounter (Signed)
Left message for pt to call back  °

## 2021-08-31 NOTE — Progress Notes (Signed)
Remote pacemaker transmission.   

## 2021-08-31 NOTE — Telephone Encounter (Signed)
-----   Message from Algernon Huxley, RN sent at 05/31/2021  1:31 PM EDT ----- Regarding: Labs Pt needs labs, orders in epic.

## 2021-09-03 ENCOUNTER — Other Ambulatory Visit (INDEPENDENT_AMBULATORY_CARE_PROVIDER_SITE_OTHER): Payer: Medicare HMO

## 2021-09-03 DIAGNOSIS — D509 Iron deficiency anemia, unspecified: Secondary | ICD-10-CM | POA: Diagnosis not present

## 2021-09-03 LAB — IBC + FERRITIN
Ferritin: 29.9 ng/mL (ref 22.0–322.0)
Iron: 66 ug/dL (ref 42–165)
Saturation Ratios: 19.3 % — ABNORMAL LOW (ref 20.0–50.0)
TIBC: 341.6 ug/dL (ref 250.0–450.0)
Transferrin: 244 mg/dL (ref 212.0–360.0)

## 2021-09-03 LAB — CBC WITH DIFFERENTIAL/PLATELET
Basophils Absolute: 0.1 10*3/uL (ref 0.0–0.1)
Basophils Relative: 1.1 % (ref 0.0–3.0)
Eosinophils Absolute: 0.9 10*3/uL — ABNORMAL HIGH (ref 0.0–0.7)
Eosinophils Relative: 11.2 % — ABNORMAL HIGH (ref 0.0–5.0)
HCT: 40.6 % (ref 39.0–52.0)
Hemoglobin: 13.7 g/dL (ref 13.0–17.0)
Lymphocytes Relative: 28.9 % (ref 12.0–46.0)
Lymphs Abs: 2.2 10*3/uL (ref 0.7–4.0)
MCHC: 33.7 g/dL (ref 30.0–36.0)
MCV: 99.9 fl (ref 78.0–100.0)
Monocytes Absolute: 0.9 10*3/uL (ref 0.1–1.0)
Monocytes Relative: 12.1 % — ABNORMAL HIGH (ref 3.0–12.0)
Neutro Abs: 3.6 10*3/uL (ref 1.4–7.7)
Neutrophils Relative %: 46.7 % (ref 43.0–77.0)
Platelets: 277 10*3/uL (ref 150.0–400.0)
RBC: 4.07 Mil/uL — ABNORMAL LOW (ref 4.22–5.81)
RDW: 13.1 % (ref 11.5–15.5)
WBC: 7.7 10*3/uL (ref 4.0–10.5)

## 2021-09-03 NOTE — Telephone Encounter (Signed)
Left message for pt to call back  °

## 2021-09-04 ENCOUNTER — Other Ambulatory Visit: Payer: Self-pay

## 2021-09-04 DIAGNOSIS — H25011 Cortical age-related cataract, right eye: Secondary | ICD-10-CM | POA: Diagnosis not present

## 2021-09-04 DIAGNOSIS — H52201 Unspecified astigmatism, right eye: Secondary | ICD-10-CM | POA: Diagnosis not present

## 2021-09-04 DIAGNOSIS — H25811 Combined forms of age-related cataract, right eye: Secondary | ICD-10-CM | POA: Diagnosis not present

## 2021-09-04 DIAGNOSIS — D509 Iron deficiency anemia, unspecified: Secondary | ICD-10-CM

## 2021-09-04 DIAGNOSIS — H2511 Age-related nuclear cataract, right eye: Secondary | ICD-10-CM | POA: Diagnosis not present

## 2021-09-06 DIAGNOSIS — Z0110 Encounter for examination of ears and hearing without abnormal findings: Secondary | ICD-10-CM | POA: Diagnosis not present

## 2021-09-12 DIAGNOSIS — N401 Enlarged prostate with lower urinary tract symptoms: Secondary | ICD-10-CM | POA: Diagnosis not present

## 2021-09-12 DIAGNOSIS — R339 Retention of urine, unspecified: Secondary | ICD-10-CM | POA: Diagnosis not present

## 2021-09-12 DIAGNOSIS — N138 Other obstructive and reflux uropathy: Secondary | ICD-10-CM | POA: Diagnosis not present

## 2021-11-06 ENCOUNTER — Telehealth: Payer: Self-pay | Admitting: Neurology

## 2021-11-06 NOTE — Telephone Encounter (Signed)
Pt would like a call from the nurse to discuss CPAP machine from [place]. [facility] informed pt could not fill the order because insurance company required a new authorization from physician within 90 days from day of the order. ?

## 2021-11-06 NOTE — Telephone Encounter (Signed)
Sent message to [facility] for clarification on what they need from [place] to get pt his new machine/supplies.  ?

## 2021-11-07 NOTE — Telephone Encounter (Signed)
Coltrane, Phylliss Blakes, RN; [place], [place]; [place], [place] ?We just need chart notes from an updated  visit stating the patient is still waiting on his cpap machine and a new order written.   ? ?  ?Previous Messages ?  ?----- Message -----  ?From: Brandon Melnick, RN  ?Sent: 11/06/2021   1:31 PM EDT  ?To: Sunnie Nielsen, Jaynee Eagles, *  ? ?HI good afternoon,  ? ?Could you tell me what is needed for this pt?  ? ?Iver Nestle  ?Male, 88 y.o., 1934/02/29  ?MRN:  ?[id number]  ? ? ? ? Last order was 10-12 -2022 and he is now wanting to get the new autopap but because it has been since 05-09-21 he can not get it without another order?  Dr Rexene Alberts last saw 03/20/2021  and pt had sleep test 05-02-2021.  ? ?Thank you, I will try to help and relay to pt.    ? ?Sandy RN   ? ?

## 2021-11-07 NOTE — Telephone Encounter (Signed)
I called [facility].  New insurance regulations state pts need to be seen within 90 days from new order.  He has [employer] MCR.  He was last seen 02-2021, and order was 04-2021.  Was having issues getting machine, due to pandemic product delays. He never heard back.  Now it is the 90 day requirement.  I spoke to wife made appt for Tuesday 11-13-2021 at 1045 with Dr. Rexene Alberts.  He may change to [employer], will let us know when comes in to office for appt.   He also got a Designer, multimedia (resmed) that he is using since Dec 2022 from a friend.  I relayed to wife to bring in machine with power cord to [place] any information available.  She appreicated call back.   ?

## 2021-11-08 ENCOUNTER — Ambulatory Visit: Payer: Medicare HMO | Admitting: Sports Medicine

## 2021-11-08 ENCOUNTER — Encounter: Payer: Self-pay | Admitting: Sports Medicine

## 2021-11-08 DIAGNOSIS — M79675 Pain in left toe(s): Secondary | ICD-10-CM | POA: Diagnosis not present

## 2021-11-08 DIAGNOSIS — B351 Tinea unguium: Secondary | ICD-10-CM | POA: Diagnosis not present

## 2021-11-08 DIAGNOSIS — G629 Polyneuropathy, unspecified: Secondary | ICD-10-CM

## 2021-11-08 DIAGNOSIS — D689 Coagulation defect, unspecified: Secondary | ICD-10-CM

## 2021-11-08 DIAGNOSIS — M79674 Pain in right toe(s): Secondary | ICD-10-CM

## 2021-11-08 NOTE — Progress Notes (Signed)
Subjective: ?Tony Cruz is a 88 y.o. male patient seen today in office with complaint of mildly painful thickened and elongated toenails; unable to trim.  Denies any changes with meds, still on Xarelto like before, no other changes.  Denies any changes with health history since last encounter. ? ?Patient Active Problem List  ? Diagnosis Date Noted  ? Osteoarthritis of right knee 12/03/2019  ? Pain in right knee 12/30/2017  ? Iron deficiency anemia 12/11/2017  ? Toe ulcer ([place]) 01/13/2017  ? Heterozygous factor V Leiden mutation ([place]) 01/13/2017  ? DVT (deep venous thrombosis) ([place]) 08/22/2016  ? History of skull fracture 10/19/2015  ? History of subdural hematoma 10/19/2015  ? Headache(784.0) 09/30/2013  ? Sinoatrial node dysfunction ([place]) 06/01/2013  ? Pacemaker-MDT 07/02/2011  ? Night sweats 07/02/2011  ? DIZZINESS 03/22/2010  ? Impaired fasting glucose 11/02/2009  ? Obstructive sleep apnea 11/21/2008  ? Backache 11/03/2008  ? Overweight 11/02/2007  ? Essential hypertension 11/02/2007  ? ALLERGIC RHINITIS 11/02/2007  ? GASTROESOPHAGEAL REFLUX DISEASE 11/02/2007  ? Diverticulosis of large intestine 11/02/2007  ? Benign prostatic hyperplasia with urinary obstruction 11/02/2007  ? SYNCOPE 11/02/2007  ? Hereditary and idiopathic peripheral neuropathy 10/30/2007  ? IRRITABLE BOWEL SYNDROME 10/30/2007  ? Osteoarthritis 10/30/2007  ? ? ?Current Outpatient Medications on File Prior to Visit  ?Medication Sig Dispense Refill  ? gabapentin (NEURONTIN) 300 MG capsule Take by mouth.    ? Acetaminophen (TYLENOL) 325 MG CAPS 1 capsule as needed    ? acetaminophen (TYLENOL) 500 MG tablet Take 500-1,000 mg by mouth every 6 (six) hours as needed for headache (or pain).     ? amLODipine (NORVASC) 10 MG tablet Take 1 tablet by mouth daily.    ? ascorbic acid (VITAMIN C) 500 MG tablet Take 500 mg by mouth daily.     ? ascorbic acid (VITAMIN C) 500 MG tablet Take 1 tablet by mouth daily.    ? calcium carbonate (OS-CAL) 600  MG TABS Take 600 mg by mouth daily.    ? chlorthalidone (HYGROTON) 25 MG tablet Take 1 tablet (25 mg total) by mouth daily. 90 tablet 3  ? cholecalciferol (VITAMIN D3) 25 MCG (1000 UNIT) tablet Take 1,000 Units by mouth daily.    ? Cholecalciferol 25 MCG (1000 UT) capsule Take by mouth.    ? Difluprednate 0.05 % EMUL Place 1 drop into the right eye 4 (four) times daily.    ? ferrous sulfate 325 (65 FE) MG tablet Take 325 mg by mouth 2 (two) times daily with a meal.     ? finasteride (PROSCAR) 5 MG tablet Take 1 tablet by mouth daily.    ? gabapentin (NEURONTIN) 100 MG capsule     ? Lecithin 1200 MG CAPS Take 1 capsule by mouth daily.    ? loratadine (CLARITIN) 10 MG tablet Take 10 mg by mouth daily.    ? losartan (COZAAR) 100 MG tablet Take 1 tablet (100 mg total) by mouth daily. 90 tablet 3  ? moxifloxacin (VIGAMOX) 0.5 % ophthalmic solution Place into the right eye.    ? Multiple Vitamin (MULTIVITAMIN) capsule Take 1 capsule by mouth daily.    ? naproxen (NAPROSYN) 500 MG tablet TAKE 1 TABLET TWICE A DAY 180 tablet 3  ? pantoprazole (PROTONIX) 40 MG tablet TAKE ONE TABLET DAILY. MAKEAN APPOINTMENT FOR FURTHER REFILLS 90 tablet 3  ? tamsulosin (FLOMAX) 0.4 MG CAPS capsule Take 0.4 mg by mouth daily.    ? traMADol (ULTRAM) 50 MG  tablet Take 50 mg by mouth 3 (three) times daily as needed.    ? valACYclovir (VALTREX) 1000 MG tablet Take 1,000 mg by mouth 3 (three) times daily.    ? XARELTO 15 MG TABS tablet TAKE 1 TABLET DAILY WITH   SUPPER 90 tablet 0  ? ?Current Facility-Administered Medications on File Prior to Visit  ?Medication Dose Route Frequency Provider Last Rate Last Admin  ? 0.9 %  sodium chloride infusion  500 mL Intravenous Continuous Pyrtle, Lajuan Lines, MD      ? ? ?Allergies  ?Allergen Reactions  ? Shellfish Allergy Rash and Other (See Comments)  ?  Sensitive to shellfish  ? ? ?Objective: ?Physical Exam ? ?General: Well developed, nourished, no acute distress, awake, alert and oriented x 3 ? ?Vascular:  Dorsalis pedis artery 1/4 bilateral, Posterior tibial artery 0/4 bilateral, skin temperature warm to warm proximal to distal bilateral lower extremities due to trace edema, + varicosities, pedal hair present bilateral. ? ?Neurological: Gross sensation present via light touch bilateral.  ? ?Dermatological: Skin is warm, dry, and supple bilateral, Nails 1-10 are tender, long, thick, and discolored with mild subungal debris, +keratosis to left 2nd toe. No signs of infection bilateral. ? ?Musculoskeletal:  Asymptomatic claw/hammertoes boney deformities noted bilateral. Muscular strength within normal limits without painon range of motion. No pain with calf compression bilateral. ? ?Assessment and Plan:  ?Problem List Items Addressed This Visit   ?None ?Visit Diagnoses   ? ? Pain due to onychomycosis of toenails of both feet    -  Primary  ? Neuropathy      ? Coagulation defect ([place])      ? ?  ? ? ?-Examined patient.  ?-Re-Discussed with patient importance of daily foot inspection in the setting of neuropathy ?-Mechanically debrided and reduced all painful mycotic nails with sterile nail nipper and dremel nail file without incident. ?-At no charge smoothed callus areas with rotary bur ?-Patient to return in 3 months for follow up evaluation or sooner if symptoms worsen. ? ?Landis Martins, DPM ? ?

## 2021-11-12 NOTE — Telephone Encounter (Signed)
Spoke with the patient and advised that his appointment with our office tomorrow is needed before he can get his machine.  He understands our office is not responsible for the actual CPAP authorization but we do get the orders and office visits over to the DME company.  Patient's questions were answered.  He understands the orders were successfully sent to [facility] back in October 2022.  He is entertaining the idea of switching to another DME company.  We will talk about this in his office visit tomorrow morning. ?

## 2021-11-12 NOTE — Telephone Encounter (Signed)
Pt has called to report that [facility] is asking an authorization be faxed to them for the CPAP.  Pt states the fax# this needs to be sent to is [phone] the ph# for [employer]'s customer service is [phone] ?

## 2021-11-13 ENCOUNTER — Encounter: Payer: Self-pay | Admitting: Neurology

## 2021-11-13 ENCOUNTER — Ambulatory Visit: Payer: Medicare HMO | Admitting: Neurology

## 2021-11-13 VITALS — BP 117/75 | HR 72 | Wt 215.8 lb

## 2021-11-13 DIAGNOSIS — Z9989 Dependence on other enabling machines and devices: Secondary | ICD-10-CM

## 2021-11-13 DIAGNOSIS — G4733 Obstructive sleep apnea (adult) (pediatric): Secondary | ICD-10-CM

## 2021-11-13 NOTE — Patient Instructions (Signed)
As discussed, we will set you up at home with a so called autoPAP machine for treatment of your moderate obstructive sleep apnea. A local DME company will be in touch with you about the set up appointment, during which you will learn about the machine and get fitted with a mask. Feel free to ask any questions you may have. You will need a follow up appointment in about 3 months with [place].  ?You have been using your friend's machine consistently.  ? ?We are going to send your order to [facility].  ? ?Certain insurances are very strict about "compliance", meaning, you may lose coverage of the machine and supplies, if you don't fulfill compliance criteria which typically means: using your machine for more than 4 hours each night (sleep period) 70% of the time. From my end of things, I would recommend that you use your machine every night all night, if possible. That way, you may get used to using the machine easier and may benefit from it better. ? ?Please remember, the long-term risks and ramifications of untreated moderate to severe obstructive sleep apnea are: increased Cardiovascular disease, including congestive heart failure, stroke, difficult to control hypertension, treatment resistant obesity, arrhythmias, especially irregular heartbeat commonly known as A. Fib. (atrial fibrillation); even type 2 diabetes has been linked to untreated OSA.  ? ?Sleep apnea can cause disruption of sleep and sleep deprivation in most cases, which, in turn, can cause recurrent headaches, problems with memory, mood, concentration, focus, and vigilance. Most people with untreated sleep apnea report excessive daytime sleepiness, which can affect their ability to drive. Please do not drive if you feel sleepy. Patients with sleep apnea developed difficulty initiating and maintaining sleep (aka insomnia). ? ?Patients with mild sleep apnea can benefit from treatment with AutoPap or CPAP, because we see symptom improvement such as improvement  in headaches, nighttime bathroom breaks, improvement in daytime sleepiness or sluggishness, improvement in mental sharpness, improvement of underlying mood disorder such as anxiety and depression etc.  ? ? ?

## 2021-11-13 NOTE — Progress Notes (Signed)
Fax confirmation received from [facility] that received order for PAP. [phone].  ?

## 2021-11-13 NOTE — Progress Notes (Signed)
Subjective:  ?  ?Patient ID: Tony Cruz is a 88 y.o. male. ? ?HPI ? ? ? ?Interim history:  ? ?Tony Cruz is an 88 year old right-handed gentleman with an underlying medical history of arthritis, status post right total knee replacement in May 2021, BPH, DVT, reflux disease, factor V Leiden deficiency, hypertension, irritable bowel syndrome, diverticulosis, syncope, status post pacemaker, neuropathy, allergies, anemia, and obesity, who presents for follow-up consultation of his obstructive sleep apnea after interim home sleep testing.  The patient is unaccompanied today.  I first met him at the request of his primary care physician on 03/20/2021, at which time he reported a prior diagnosis of obstructive sleep apnea.  He was on CPAP therapy and reported compliance with treatment and benefit from treatment.  He was using a ResMed fullface mask, a compliance download was not possible and prior sleep study results were not available.  He was advised to proceed with sleep testing.  He had a home sleep test on 05/02/2021 which indicated moderate obstructive sleep apnea with an AHI of 25.8/h, O2 nadir 79% with mostly mild to moderate snoring detected.  He was advised to start AutoPap therapy with a new machine.  He was not able to get his new machine as yet, which I prescribed in October 2022.  He needed an updated follow-up appointment and a new order. ?His DME company was [employer], he would like to switch providers.   ? ?Today, 11/13/2021: He reports doing fairly well.  He has been using a friend's machine since May 2022.  We were able to get data off his CPAP machine.  His friend has a ResMed S9 CPAP machine set at 7 cm.  Patient has been compliant with treatment, he has been using his machine consistently in the past 90 days with an average of 7 hours and 43 minutes, residual AHI slightly elevated at 6.4/h, leak on the higher side with the 95th percentile at 20.7 L/min, pressure at 7 cm without EPR.  He reports  that he would like to get a new machine as soon as possible.  His old machine was also a ResMed S9 but he has not used it essentially since May of last year.  Has had ongoing benefit from CPAP therapy and is very motivated to continue with treatment.  He uses a full facemask.  He limits his caffeine and drinks mostly decaf drinks. ? ?The patient's allergies, current medications, family history, past medical history, past social history, past surgical history and problem list were reviewed and updated as appropriate.  ? ?Previously:  ? ?03/20/21: (He) was previously diagnosed with obstructive sleep apnea and placed on CPAP therapy.  He reports compliance with treatment.  Prior sleep study results are not available for my review today.  Previous testing was through the [place] in [place], [place] is [place].  I reviewed your office note from 02/29/2020.  His Epworth sleepiness score is 5 out of 24, fatigue severity score is 28 out of 63.  A CPAP compliance download was not possible as his machine is older.  He brought his old machine but has not used it since approximately June.  Motor life has been exceeded.  He has been using a friend's machine but does not know to setting on it.  He uses a Copy full facemask from [employer]	[place].  He has over the course of time benefited from CPAP therapy and is compliant with treatment in general.  Bedtime is generally around 11:30 PM and rise time around  7:30 AM.  He has nocturia about once or twice per average night.  He does have sinus congestion which does become bothersome at night.  He does not have a family history of sleep apnea.  He is retired, worked in [employer].  He plays golf about twice a week.  He is a non-smoker and drinks alcohol rarely.  Does not drink caffeine on a day-to-day basis.  He has 2 grown children.  He does not have a TV in the bedroom, no pets in the household. ? ? ?His Past Medical History Is Significant For: ?Past Medical History:  ?Diagnosis  Date  ? Allergy   ? Anal fissure   ? Anemia   ? Arthritis   ? diffuse arthritis  ? Backache, unspecified   ? Barrett's esophagus   ? BPH (benign prostatic hyperplasia)   ? Diverticulosis of colon (without mention of hemorrhage)   ? DVT of leg (deep venous thrombosis) ([place])   ? Esophageal reflux   ? Factor V Leiden deficiency ([place])   ? GERD (gastroesophageal reflux disease)   ? hypertension   ? Hypertrophy of prostate with urinary obstruction and other lower urinary tract symptoms (LUTS)   ? Irritable bowel syndrome   ? Obstructive sleep apnea   ? Osteoarthrosis, unspecified whether generalized or localized, unspecified site   ? Other abnormal glucose   ? Overweight(278.02)   ? Pacemaker-MDT   ? Change out 2008  ? Sleep apnea 2016  ? Uses cpap  ? Syncope and collapse   ? Tubular adenoma of colon   ? Unspecified hereditary and idiopathic peripheral neuropathy   ? ? ?His Past Surgical History Is Significant For: ?Past Surgical History:  ?Procedure Laterality Date  ? APPENDECTOMY    ? CHOLECYSTECTOMY    ? PACEMAKER PLACEMENT    ? PPM GENERATOR CHANGEOUT N/A 05/21/2017  ? Procedure: PPM GENERATOR CHANGEOUT;  Surgeon: Deboraha Sprang, MD;  Location: [facility];  Service: Cardiovascular;  Laterality: N/A;  ? skin cancer removed  12/2012  ? removed from his face--Dr. Sarajane Jews  ? TONSILLECTOMY    ? TOTAL KNEE ARTHROPLASTY Right 12/03/2019  ? Procedure: TOTAL KNEE ARTHROPLASTY;  Surgeon: Sydnee Cabal, MD;  Location: [facility];  Service: Orthopedics;  Laterality: Right;  adductor canal  ? ? ?His Family History Is Significant For: ?Family History  ?Problem Relation Age of Onset  ? Coronary artery disease Mother   ? Coronary artery disease Father   ?     bladder cancer  ? Prostate cancer Father   ? Bladder Cancer Father   ? Heart Problems Father   ? Diabetes Sister   ?     2 sisters  ? Congestive Heart Failure Sister   ? CVA Sister   ? Diabetes Sister   ? Colon cancer Neg Hx   ? Stomach cancer Neg Hx   ? Pancreatic cancer  Neg Hx   ? Sleep apnea Neg Hx   ? ? ?His Social History Is Significant For: ?Social History  ? ?Socioeconomic History  ? Marital status: Married  ?  Spouse name: phyllis Rock  ? Number of children: Not on file  ? Years of education: Not on file  ? Highest education level: Not on file  ?Occupational History  ? Occupation: Retired  ?  Employer: RETIRED  ?Tobacco Use  ? Smoking status: Never  ? Smokeless tobacco: Never  ?Vaping Use  ? Vaping Use: Never used  ?Substance and Sexual Activity  ?  Alcohol use: Yes  ?  Alcohol/week: 0.0 standard drinks  ?  Comment: occasionally   ? Drug use: No  ? Sexual activity: Never  ?  Birth control/protection: Abstinence  ?Other Topics Concern  ? Not on file  ?Social History Narrative  ? Not on file  ? ?Social Determinants of Health  ? ?Financial Resource Strain: Not on file  ?Food Insecurity: Not on file  ?Transportation Needs: Not on file  ?Physical Activity: Not on file  ?Stress: Not on file  ?Social Connections: Not on file  ? ? ?His Allergies Are:  ?Allergies  ?Allergen Reactions  ? Shellfish Allergy Rash and Other (See Comments)  ?  Sensitive to shellfish  ?:  ? ?His Current Medications Are:  ?Outpatient Encounter Medications as of 11/13/2021  ?Medication Sig  ? acetaminophen (TYLENOL) 500 MG tablet Take 500-1,000 mg by mouth every 6 (six) hours as needed for headache (or pain).   ? amLODipine (NORVASC) 10 MG tablet Take 1 tablet by mouth daily.  ? ascorbic acid (VITAMIN C) 500 MG tablet Take 1 tablet by mouth daily.  ? calcium carbonate (OS-CAL) 600 MG TABS Take 600 mg by mouth daily.  ? chlorthalidone (HYGROTON) 25 MG tablet Take 1 tablet (25 mg total) by mouth daily.  ? cholecalciferol (VITAMIN D3) 25 MCG (1000 UNIT) tablet Take 1,000 Units by mouth daily.  ? ferrous sulfate 325 (65 FE) MG tablet Take 325 mg by mouth 2 (two) times daily with a meal.   ? finasteride (PROSCAR) 5 MG tablet Take 1 tablet by mouth daily.  ? gabapentin (NEURONTIN) 300 MG capsule Take 300 mg by mouth  2 (two) times daily.  ? loratadine (CLARITIN) 10 MG tablet Take 10 mg by mouth daily.  ? losartan (COZAAR) 100 MG tablet Take 1 tablet (100 mg total) by mouth daily.  ? Multiple Vitamin (MULTIVITAMIN) ca

## 2021-11-19 ENCOUNTER — Ambulatory Visit (INDEPENDENT_AMBULATORY_CARE_PROVIDER_SITE_OTHER): Payer: Medicare HMO

## 2021-11-19 DIAGNOSIS — I495 Sick sinus syndrome: Secondary | ICD-10-CM | POA: Diagnosis not present

## 2021-11-20 DIAGNOSIS — G4733 Obstructive sleep apnea (adult) (pediatric): Secondary | ICD-10-CM | POA: Diagnosis not present

## 2021-11-20 LAB — CUP PACEART REMOTE DEVICE CHECK
Date Time Interrogation Session: 20230425073515
Implantable Lead Implant Date: 19990330
Implantable Lead Implant Date: 19990330
Implantable Lead Location: 753859
Implantable Lead Location: 753860
Implantable Lead Model: 5068
Implantable Lead Model: 5092
Implantable Pulse Generator Implant Date: 20181024
Pulse Gen Model: 407145
Pulse Gen Serial Number: 69203177

## 2021-11-26 ENCOUNTER — Telehealth: Payer: Self-pay | Admitting: *Deleted

## 2021-11-26 NOTE — Telephone Encounter (Signed)
DME: [facility] ?Ph: [phone] ?Fax: [phone] ?Resmed Airsense 11 ?Setup 11/20/21 (appt needed 12/21/21-02/19/22) ? ?

## 2021-11-27 ENCOUNTER — Telehealth: Payer: Self-pay | Admitting: Neurology

## 2021-11-27 NOTE — Telephone Encounter (Signed)
Called pt and LVM stating that he is needing to schedule his Initial Cpap visit. DME and between dates are in pt's SnapShot.  

## 2021-12-05 NOTE — Progress Notes (Signed)
Remote pacemaker transmission.   

## 2021-12-20 DIAGNOSIS — G4733 Obstructive sleep apnea (adult) (pediatric): Secondary | ICD-10-CM | POA: Diagnosis not present

## 2021-12-26 NOTE — Patient Instructions (Signed)

## 2021-12-26 NOTE — Progress Notes (Unsigned)
PATIENT: Tony Cruz DOB: 11/05/1933  REASON FOR VISIT: follow up HISTORY FROM: patient  No chief complaint on file.    HISTORY OF PRESENT ILLNESS:  12/26/21 ALL:  Zaniel Marineau is a 88 y.o. male here today for follow up for OSA on CPAP.  He was last seen by Dr Rexene Alberts 10/2021 and had been using his friend's CPAP machine due to his being broken. New CPAP orders placed. He returns for initial compliance eval.     HISTORY: (copied from Dr Guadelupe Sabin previous note)  Mr. Matton is an 88 year old right-handed gentleman with an underlying medical history of arthritis, status post right total knee replacement in May 2021, BPH, DVT, reflux disease, factor V Leiden deficiency, hypertension, irritable bowel syndrome, diverticulosis, syncope, status post pacemaker, neuropathy, allergies, anemia, and obesity, who presents for follow-up consultation of his obstructive sleep apnea after interim home sleep testing.  The patient is unaccompanied today.  I first met him at the request of his primary care physician on 03/20/2021, at which time he reported a prior diagnosis of obstructive sleep apnea.  He was on CPAP therapy and reported compliance with treatment and benefit from treatment.  He was using a ResMed fullface mask, a compliance download was not possible and prior sleep study results were not available.  He was advised to proceed with sleep testing.  He had a home sleep test on 05/02/2021 which indicated moderate obstructive sleep apnea with an AHI of 25.8/h, O2 nadir 79% with mostly mild to moderate snoring detected.  He was advised to start AutoPap therapy with a new machine.  He was not able to get his new machine as yet, which I prescribed in October 2022.  He needed an updated follow-up appointment and a new order. His DME company was [employer], he would like to switch providers.     Today, 11/13/2021: He reports doing fairly well.  He has been using a friend's machine since May 2022.   We were able to get data off his CPAP machine.  His friend has a ResMed S9 CPAP machine set at 7 cm.  Patient has been compliant with treatment, he has been using his machine consistently in the past 90 days with an average of 7 hours and 43 minutes, residual AHI slightly elevated at 6.4/h, leak on the higher side with the 95th percentile at 20.7 L/min, pressure at 7 cm without EPR.  He reports that he would like to get a new machine as soon as possible.  His old machine was also a ResMed S9 but he has not used it essentially since May of last year.  Has had ongoing benefit from CPAP therapy and is very motivated to continue with treatment.  He uses a full facemask.  He limits his caffeine and drinks mostly decaf drinks.     REVIEW OF SYSTEMS: Out of a complete 14 system review of symptoms, the patient complains only of the following symptoms, and all other reviewed systems are negative.  ESS:  ALLERGIES: Allergies  Allergen Reactions   Shellfish Allergy Rash and Other (See Comments)    Sensitive to shellfish    HOME MEDICATIONS: Outpatient Medications Prior to Visit  Medication Sig Dispense Refill   acetaminophen (TYLENOL) 500 MG tablet Take 500-1,000 mg by mouth every 6 (six) hours as needed for headache (or pain).      amLODipine (NORVASC) 10 MG tablet Take 1 tablet by mouth daily.     ascorbic acid (VITAMIN C) 500  MG tablet Take 1 tablet by mouth daily.     calcium carbonate (OS-CAL) 600 MG TABS Take 600 mg by mouth daily.     chlorthalidone (HYGROTON) 25 MG tablet Take 1 tablet (25 mg total) by mouth daily. 90 tablet 3   cholecalciferol (VITAMIN D3) 25 MCG (1000 UNIT) tablet Take 1,000 Units by mouth daily.     ferrous sulfate 325 (65 FE) MG tablet Take 325 mg by mouth 2 (two) times daily with a meal.      finasteride (PROSCAR) 5 MG tablet Take 1 tablet by mouth daily.     gabapentin (NEURONTIN) 300 MG capsule Take 300 mg by mouth 2 (two) times daily.     loratadine (CLARITIN) 10 MG  tablet Take 10 mg by mouth daily.     losartan (COZAAR) 100 MG tablet Take 1 tablet (100 mg total) by mouth daily. 90 tablet 3   Multiple Vitamin (MULTIVITAMIN) capsule Take 1 capsule by mouth daily.     naproxen (NAPROSYN) 500 MG tablet TAKE 1 TABLET TWICE A DAY 180 tablet 3   pantoprazole (PROTONIX) 40 MG tablet TAKE ONE TABLET DAILY. MAKEAN APPOINTMENT FOR FURTHER REFILLS 90 tablet 3   tamsulosin (FLOMAX) 0.4 MG CAPS capsule Take 0.4 mg by mouth daily.     traMADol (ULTRAM) 50 MG tablet Take 50 mg by mouth 3 (three) times daily as needed.     XARELTO 15 MG TABS tablet TAKE 1 TABLET DAILY WITH   SUPPER 90 tablet 0   Facility-Administered Medications Prior to Visit  Medication Dose Route Frequency Provider Last Rate Last Admin   0.9 %  sodium chloride infusion  500 mL Intravenous Continuous Pyrtle, Lajuan Lines, MD        PAST MEDICAL HISTORY: Past Medical History:  Diagnosis Date   Allergy    Anal fissure    Anemia    Arthritis    diffuse arthritis   Backache, unspecified    Barrett's esophagus    BPH (benign prostatic hyperplasia)    Diverticulosis of colon (without mention of hemorrhage)    DVT of leg (deep venous thrombosis) ([facility])    Esophageal reflux    Factor V Leiden deficiency ([facility])    GERD (gastroesophageal reflux disease)    hypertension    Hypertrophy of prostate with urinary obstruction and other lower urinary tract symptoms (LUTS)    Irritable bowel syndrome    Obstructive sleep apnea    Osteoarthrosis, unspecified whether generalized or localized, unspecified site    Other abnormal glucose    Overweight(278.02)    Pacemaker-MDT    Change out 2008   Sleep apnea 2016   Uses cpap   Syncope and collapse    Tubular adenoma of colon    Unspecified hereditary and idiopathic peripheral neuropathy     PAST SURGICAL HISTORY: Past Surgical History:  Procedure Laterality Date   APPENDECTOMY     CHOLECYSTECTOMY     PACEMAKER PLACEMENT     PPM GENERATOR CHANGEOUT N/A  05/21/2017   Procedure: PPM GENERATOR CHANGEOUT;  Surgeon: Deboraha Sprang, MD;  Location: [facility];  Service: Cardiovascular;  Laterality: N/A;   skin cancer removed  12/2012   removed from his face--Dr. Caldwell Right 12/03/2019   Procedure: TOTAL KNEE ARTHROPLASTY;  Surgeon: Sydnee Cabal, MD;  Location: [facility];  Service: Orthopedics;  Laterality: Right;  adductor canal    FAMILY HISTORY: Family History  Problem Relation  Age of Onset   Coronary artery disease Mother    Coronary artery disease Father        bladder cancer   Prostate cancer Father    Bladder Cancer Father    Heart Problems Father    Diabetes Sister        2 sisters   Congestive Heart Failure Sister    CVA Sister    Diabetes Sister    Colon cancer Neg Hx    Stomach cancer Neg Hx    Pancreatic cancer Neg Hx    Sleep apnea Neg Hx     SOCIAL HISTORY: Social History   Socioeconomic History   Marital status: Married    Spouse name: phyllis Pharris   Number of children: Not on file   Years of education: Not on file   Highest education level: Not on file  Occupational History   Occupation: Retired    Fish farm manager: RETIRED  Tobacco Use   Smoking status: Never   Smokeless tobacco: Never  Vaping Use   Vaping Use: Never used  Substance and Sexual Activity   Alcohol use: Yes    Alcohol/week: 0.0 standard drinks    Comment: occasionally    Drug use: No   Sexual activity: Never    Birth control/protection: Abstinence  Other Topics Concern   Not on file  Social History Narrative   Not on file   Social Determinants of Health   Financial Resource Strain: Not on file  Food Insecurity: Not on file  Transportation Needs: Not on file  Physical Activity: Not on file  Stress: Not on file  Social Connections: Not on file  Intimate Partner Violence: Not on file     PHYSICAL EXAM  There were no vitals filed for this visit. There is no height or weight on  file to calculate BMI.  Generalized: Well developed, in no acute distress  Cardiology: normal rate and rhythm, no murmur noted Respiratory: clear to auscultation bilaterally  Neurological examination  Mentation: Alert oriented to time, place, history taking. Follows all commands speech and language fluent Cranial nerve II-XII: Pupils were equal round reactive to light. Extraocular movements were full, visual field were full  Motor: The motor testing reveals 5 over 5 strength of all 4 extremities. Good symmetric motor tone is noted throughout.  Gait and station: Gait is normal.    DIAGNOSTIC DATA (LABS, IMAGING, TESTING) - I reviewed patient records, labs, notes, testing and imaging myself where available.      View : No data to display.           Lab Results  Component Value Date   WBC 7.7 09/03/2021   HGB 13.7 09/03/2021   HCT 40.6 09/03/2021   MCV 99.9 09/03/2021   PLT 277.0 09/03/2021      Component Value Date/Time   NA 131 (L) 11/08/2020 1152   K 4.1 11/08/2020 1152   CL 92 (L) 11/08/2020 1152   CO2 22 11/08/2020 1152   GLUCOSE 117 (H) 11/08/2020 1152   GLUCOSE 89 11/26/2019 1441   BUN 13 11/08/2020 1152   CREATININE 1.02 11/08/2020 1152   CREATININE 0.93 05/20/2016 1041   CALCIUM 9.1 11/08/2020 1152   PROT 7.0 10/01/2017 1526   ALBUMIN 4.2 10/01/2017 1526   AST 22 10/01/2017 1526   ALT 27 10/01/2017 1526   ALKPHOS 49 10/01/2017 1526   BILITOT 0.5 10/01/2017 1526   GFRNONAA 71 07/05/2020 1504   GFRAA 83 07/05/2020 1504   Lab Results  Component Value Date   CHOL 165 01/15/2018   HDL 48.90 01/15/2018   LDLCALC 86 01/15/2018   LDLDIRECT 83.0 01/23/2016   TRIG 149.0 01/15/2018   CHOLHDL 3 01/15/2018   Lab Results  Component Value Date   HGBA1C 6.1 01/23/2016   Lab Results  Component Value Date   VITAMINB12 434 01/14/2017   Lab Results  Component Value Date   TSH 2.090 11/08/2020     ASSESSMENT AND PLAN 88 y.o. year old male  has a past  medical history of Allergy, Anal fissure, Anemia, Arthritis, Backache, unspecified, Barrett's esophagus, BPH (benign prostatic hyperplasia), Diverticulosis of colon (without mention of hemorrhage), DVT of leg (deep venous thrombosis) ([place]), Esophageal reflux, Factor V Leiden deficiency ([place]), GERD (gastroesophageal reflux disease), hypertension, Hypertrophy of prostate with urinary obstruction and other lower urinary tract symptoms (LUTS), Irritable bowel syndrome, Obstructive sleep apnea, Osteoarthrosis, unspecified whether generalized or localized, unspecified site, Other abnormal glucose, Overweight(278.02), Pacemaker-MDT, Sleep apnea (2016), Syncope and collapse, Tubular adenoma of colon, and Unspecified hereditary and idiopathic peripheral neuropathy. here with   No diagnosis found.    Aryaman Haliburton is doing well on CPAP therapy. Compliance report reveals excellent compliance. He was encouraged to continue using CPAP nightly and for greater than 4 hours each night. We will update supply orders as indicated. Risks of untreated sleep apnea review and education materials provided. Healthy lifestyle habits encouraged. He will follow up in 1 year, sooner if needed. He verbalizes understanding and agreement with this plan.    No orders of the defined types were placed in this encounter.    No orders of the defined types were placed in this encounter.     Debbora Presto, FNP-C 12/26/2021, 5:36 PM [facility] [address] [place], [place] [address] [phone]

## 2021-12-27 ENCOUNTER — Encounter: Payer: Self-pay | Admitting: Family Medicine

## 2021-12-27 ENCOUNTER — Ambulatory Visit: Payer: Medicare HMO | Admitting: Family Medicine

## 2021-12-27 VITALS — BP 118/73 | HR 77 | Ht 67.0 in | Wt 219.0 lb

## 2021-12-27 DIAGNOSIS — G4733 Obstructive sleep apnea (adult) (pediatric): Secondary | ICD-10-CM

## 2021-12-27 DIAGNOSIS — Z9989 Dependence on other enabling machines and devices: Secondary | ICD-10-CM

## 2022-01-10 ENCOUNTER — Telehealth: Payer: Self-pay

## 2022-01-10 NOTE — Telephone Encounter (Signed)
I spoke with the patient. [employer] is sending him a new monitor in 3-6 days.

## 2022-01-11 ENCOUNTER — Encounter (HOSPITAL_COMMUNITY): Payer: Self-pay | Admitting: Emergency Medicine

## 2022-01-11 ENCOUNTER — Other Ambulatory Visit: Payer: Self-pay

## 2022-01-11 ENCOUNTER — Emergency Department (HOSPITAL_COMMUNITY)
Admission: EM | Admit: 2022-01-11 | Discharge: 2022-01-11 | Disposition: A | Discharge status: Home / Self Care | Payer: Medicare HMO | Attending: Emergency Medicine | Admitting: Emergency Medicine

## 2022-01-11 ENCOUNTER — Other Ambulatory Visit: Payer: Self-pay | Admitting: Internal Medicine

## 2022-01-11 DIAGNOSIS — D649 Anemia, unspecified: Secondary | ICD-10-CM | POA: Insufficient documentation

## 2022-01-11 DIAGNOSIS — Z7901 Long term (current) use of anticoagulants: Secondary | ICD-10-CM | POA: Diagnosis not present

## 2022-01-11 DIAGNOSIS — Z950 Presence of cardiac pacemaker: Secondary | ICD-10-CM | POA: Diagnosis not present

## 2022-01-11 DIAGNOSIS — I10 Essential (primary) hypertension: Secondary | ICD-10-CM

## 2022-01-11 DIAGNOSIS — Z743 Need for continuous supervision: Secondary | ICD-10-CM | POA: Diagnosis not present

## 2022-01-11 DIAGNOSIS — I959 Hypotension, unspecified: Secondary | ICD-10-CM | POA: Diagnosis not present

## 2022-01-11 DIAGNOSIS — R55 Syncope and collapse: Secondary | ICD-10-CM

## 2022-01-11 DIAGNOSIS — R609 Edema, unspecified: Secondary | ICD-10-CM

## 2022-01-11 DIAGNOSIS — R0902 Hypoxemia: Secondary | ICD-10-CM | POA: Diagnosis not present

## 2022-01-11 LAB — COMPREHENSIVE METABOLIC PANEL
ALT: 21 U/L (ref 0–44)
AST: 27 U/L (ref 15–41)
Albumin: 3.6 g/dL (ref 3.5–5.0)
Alkaline Phosphatase: 40 U/L (ref 38–126)
Anion gap: 13 (ref 5–15)
BUN: 22 mg/dL (ref 8–23)
CO2: 18 mmol/L — ABNORMAL LOW (ref 22–32)
Calcium: 8.6 mg/dL — ABNORMAL LOW (ref 8.9–10.3)
Chloride: 107 mmol/L (ref 98–111)
Creatinine, Ser: 1.31 mg/dL — ABNORMAL HIGH (ref 0.61–1.24)
GFR, Estimated: 53 mL/min — ABNORMAL LOW (ref >60)
Glucose, Bld: 103 mg/dL — ABNORMAL HIGH (ref 70–99)
Potassium: UNDETERMINED mmol/L (ref 3.5–5.1)
Sodium: 138 mmol/L (ref 135–145)
Total Bilirubin: 0.8 mg/dL (ref 0.3–1.2)
Total Protein: 6.3 g/dL — ABNORMAL LOW (ref 6.5–8.1)

## 2022-01-11 LAB — TROPONIN I (HIGH SENSITIVITY)
Troponin I (High Sensitivity): 9 ng/L (ref <18)
Troponin I (High Sensitivity): 6 ng/L (ref <18)

## 2022-01-11 LAB — I-STAT CHEM 8, ED
BUN: 21 mg/dL (ref 8–23)
Calcium, Ion: 1.14 mmol/L — ABNORMAL LOW (ref 1.15–1.40)
Chloride: 99 mmol/L (ref 98–111)
Creatinine, Ser: 1.2 mg/dL (ref 0.61–1.24)
Glucose, Bld: 123 mg/dL — ABNORMAL HIGH (ref 70–99)
HCT: 37 % — ABNORMAL LOW (ref 39.0–52.0)
Hemoglobin: 12.6 g/dL — ABNORMAL LOW (ref 13.0–17.0)
Potassium: 4 mmol/L (ref 3.5–5.1)
Sodium: 135 mmol/L (ref 135–145)
TCO2: 23 mmol/L (ref 22–32)

## 2022-01-11 LAB — CK: Total CK: 223 U/L (ref 49–397)

## 2022-01-11 LAB — CBC
HCT: 30.7 % — ABNORMAL LOW (ref 39.0–52.0)
Hemoglobin: 10.2 g/dL — ABNORMAL LOW (ref 13.0–17.0)
MCH: 34.6 pg — ABNORMAL HIGH (ref 26.0–34.0)
MCHC: 33.2 g/dL (ref 30.0–36.0)
MCV: 104.1 fL — ABNORMAL HIGH (ref 80.0–100.0)
Platelets: 189 10*3/uL (ref 150–400)
RBC: 2.95 MIL/uL — ABNORMAL LOW (ref 4.22–5.81)
RDW: 12.8 % (ref 11.5–15.5)
WBC: 7.4 10*3/uL (ref 4.0–10.5)
nRBC: 0 % (ref 0.0–0.2)

## 2022-01-11 MED ORDER — SODIUM CHLORIDE 0.9 % IV BOLUS
1000.0000 mL | Freq: Once | INTRAVENOUS | Status: AC
  Administered 2022-01-11: 1000 mL via INTRAVENOUS

## 2022-01-11 NOTE — ED Notes (Addendum)
Call placed to [employer] for pacemaker interrogation. Rep with be out to interrogate.

## 2022-01-11 NOTE — Discharge Instructions (Signed)
Your blood work turned out normal.  We did not find any evidence of heart strain (you had normal troponin).  Your blood electrolytes were all normal.  Your red blood cell count was a little on the low side (called anemia) but not profound enough to cause syncope like this.  We had your [employer] rep come by to interrogate your pacemaker.  It did not show any periods of arrhythmia or pacing that had to be done around the time of this episode.  It did show that your heart rate elevated to about 120 or 130 and then dropped suddenly.  This may precipitated your syncopal episode.  Overall I do not find anything different than your known SA node dysfunction that could have caused this syncopal episode.  Please follow-up with your cardiologist and your PCP as needed.  As always, come back to the ED if you develop worsening symptoms or have acute concerns.

## 2022-01-11 NOTE — ED Notes (Signed)
Representative for [employer] has arrived for pt's pacemaker interrogation

## 2022-01-11 NOTE — ED Notes (Signed)
Call placed to [employer] for pacemaker interrogation.

## 2022-01-11 NOTE — ED Notes (Signed)
Alex with [employer] stated pt's pacemaker interrogation was WNL, no concerns noted, stated around time pt playing golf HR 120s-130s, and one episode around the time pt stated he had a syncopal episode pt's HR dropped "quick" from 120s to the 60s, but did not fall below the pacemaker set rate and HR quickly recovered back to WNL. Dr. Darl Householder updated on the results and verbalized understanding.

## 2022-01-11 NOTE — ED Triage Notes (Signed)
[place] EMS [place], played for about 2 hours and consumed 2 bottles water. Upon sitting down at table, pt loss consciousness. Nearby bistander lowered him to floor. Pt has pacemaker and hx of syncope. A&O x4, 500 mL fluids in 20 gauge IV.  BP 126/68 P 76 RR 20 spO2 94% RA CBG 112

## 2022-01-11 NOTE — ED Provider Notes (Signed)
[place] DEPT Provider Note   CSN: [id number] Arrival date & time: 01/11/22  1433  History Chief Complaint  Patient presents with   Loss of Consciousness   Tony Cruz is a 88 y.o. male.  88 year old male [place].  PMH includes syncope, SA node dysfunction s/p pacemaker, factor V Leiden.  Per reports, patient was [place] the golf course for couple hours, drink 2 bottles of water.  Was seen on a table keeping score, when he felt "the blood leave his head" a little dizzy, and then lost consciousness.  Bystanders could not get him to respond and wake up, so they helped to lower him to the ground where he eventually regained consciousness after couple minutes.  Patient's wife is at bedside, she reports he sees a cardiologist for his SA node dysfunction and syncope, this is the reason he has a pacemaker.  Patient denies headache, vision changes, neck pain, chest pain, epigastric pain.  Home Medications Prior to Admission medications   Medication Sig Start Date End Date Taking? Authorizing Provider  acetaminophen (TYLENOL) 500 MG tablet Take 500-1,000 mg by mouth every 6 (six) hours as needed for headache (or pain).     [provider]  amLODipine (NORVASC) 10 MG tablet Take 1 tablet by mouth daily.    [provider]  ascorbic acid (VITAMIN C) 500 MG tablet Take 1 tablet by mouth daily.    [provider]  calcium carbonate (OS-CAL) 600 MG TABS Take 600 mg by mouth daily.    [provider]  chlorthalidone (HYGROTON) 25 MG tablet TAKE 1 TABLET DAILY 01/11/22   Deboraha Sprang, MD  cholecalciferol (VITAMIN D3) 25 MCG (1000 UNIT) tablet Take 1,000 Units by mouth daily.    [provider]  ferrous sulfate 325 (65 FE) MG tablet Take 325 mg by mouth 2 (two) times daily with a meal.     [provider]  finasteride (PROSCAR) 5 MG tablet Take 1 tablet by mouth daily. 08/13/11    [provider]  gabapentin (NEURONTIN) 300 MG capsule Take 300 mg by mouth 2 (two) times daily. 08/29/20   [provider]  loratadine (CLARITIN) 10 MG tablet Take 10 mg by mouth daily.    [provider]  losartan (COZAAR) 100 MG tablet Take 1 tablet (100 mg total) by mouth daily. 10/16/17   [name], MD  Multiple Vitamin (MULTIVITAMIN) capsule Take 1 capsule by mouth daily.    [provider]  naproxen (NAPROSYN) 500 MG tablet TAKE 1 TABLET TWICE A DAY 11/25/17   [name], MD  pantoprazole (PROTONIX) 40 MG tablet TAKE ONE TABLET DAILY. MAKEAN APPOINTMENT FOR FURTHER REFILLS 05/01/18   [name], MD  tamsulosin [facility]) 0.4 MG CAPS capsule Take 0.4 mg by mouth daily.    [provider]  traMADol (ULTRAM) 50 MG tablet Take 50 mg by mouth 3 (three) times daily as needed.    [provider]  XARELTO 15 MG TABS tablet TAKE 1 TABLET DAILY WITH   SUPPER 12/15/18   [name], MD     Allergies    Shellfish allergy    Review of Systems   Review of Systems  Constitutional:  Negative for chills, diaphoresis, fatigue and fever.  HENT:  Negative for congestion, sinus pressure and sinus pain.   Respiratory:  Negative for cough, shortness of breath and stridor.   Gastrointestinal:  Negative for  diarrhea, nausea and vomiting.  Neurological:  Positive for dizziness, syncope and light-headedness. Negative for seizures, weakness, numbness and headaches.   Physical Exam Updated Vital Signs Ht '5\' 7"'$  (1.702 m)   Wt 97.5 kg   BMI 33.67 kg/m  Physical Exam Constitutional:      General: He is not in acute distress.    Appearance: Normal appearance. He is normal weight. He is not ill-appearing or toxic-appearing.  HENT:     Head: Normocephalic and atraumatic.     Nose: Nose normal.  Eyes:     General: No scleral icterus.    Conjunctiva/sclera: Conjunctivae normal.  Cardiovascular:     Rate and Rhythm: Normal rate and regular rhythm.      Pulses: Normal pulses.     Heart sounds: Normal heart sounds. No murmur heard. Pulmonary:     Effort: Pulmonary effort is normal. No respiratory distress.     Breath sounds: Normal breath sounds. No wheezing.  Skin:    General: Skin is warm.     Capillary Refill: Capillary refill takes less than 2 seconds.  Neurological:     General: No focal deficit present.     Mental Status: He is alert.  Psychiatric:        Mood and Affect: Mood normal.        Behavior: Behavior normal.   ED Results / Procedures / Treatments   Labs (all labs ordered are listed, but only abnormal results are displayed) Labs Reviewed  CBC  BASIC METABOLIC PANEL   EKG None  Radiology No results found.  Procedures Procedures   Medications Ordered in ED Medications  sodium chloride 0.9 % bolus 1,000 mL (has no administration in time range)   ED Course/ Medical Decision Making/ A&P                           Medical Decision Making 88 year old male with history of SA node dysfunction s/p pacemaker presented via EMS for syncope.  Vital signs stable on presentation.  Received 500 mL NS bolus in route, ordered another liter bolus in ED. CBC demonstrates mild anemia to 10.3, otherwise unremarkable.  BMP normal and EKG stable from prior.  [employer] rep consulted, pacemaker interrogation revealed only elevated heart rate up to 130s with sudden drop back down to baseline; no evidence of required pacing or arrhythmias.  Suspect this is likely his baseline syncope due to SA node dysfunction. Discussed with patient, he is amenable to discharge home with close cardiology follow-up.   Amount and/or Complexity of Data Reviewed Independent Historian: spouse    Details: Wife at bedside Labs: ordered.    Details: CBC, BMP   Final Clinical Impression(s) / ED Diagnoses Final diagnoses:  None   Rx / DC Orders ED Discharge Orders     None      Ezequiel Essex, MD   Ezequiel Essex, MD 01/11/22 2050     Drenda Freeze, MD 01/12/22 [phone]

## 2022-01-11 NOTE — ED Notes (Signed)
Pacemaker attempted interrogation several times with device unable to locate.

## 2022-01-12 ENCOUNTER — Encounter: Payer: Self-pay | Admitting: Internal Medicine

## 2022-01-20 DIAGNOSIS — G4733 Obstructive sleep apnea (adult) (pediatric): Secondary | ICD-10-CM | POA: Diagnosis not present

## 2022-02-08 ENCOUNTER — Encounter: Payer: Self-pay | Admitting: Podiatry

## 2022-02-08 ENCOUNTER — Ambulatory Visit: Payer: Medicare HMO | Admitting: Podiatry

## 2022-02-08 DIAGNOSIS — B351 Tinea unguium: Secondary | ICD-10-CM | POA: Diagnosis not present

## 2022-02-08 DIAGNOSIS — G629 Polyneuropathy, unspecified: Secondary | ICD-10-CM

## 2022-02-08 DIAGNOSIS — D689 Coagulation defect, unspecified: Secondary | ICD-10-CM

## 2022-02-08 DIAGNOSIS — M79674 Pain in right toe(s): Secondary | ICD-10-CM | POA: Diagnosis not present

## 2022-02-08 DIAGNOSIS — M79675 Pain in left toe(s): Secondary | ICD-10-CM

## 2022-02-08 NOTE — Progress Notes (Signed)
This patient returns to my office for at risk foot care.  This patient requires this care by a professional since this patient will be at risk due to having coagulation defect for which he takes xarelto. This patient is unable to cut nails himself since the patient cannot reach his nails.These nails are painful walking and wearing shoes.  This patient presents for at risk foot care today.  General Appearance  Alert, conversant and in no acute stress.  Vascular  Dorsalis pedis and posterior tibial  pulses are palpable  bilaterally.  Capillary return is within normal limits  bilaterally. Temperature is within normal limits  bilaterally.  Neurologic  Senn-Weinstein monofilament wire test within normal limits  bilaterally. Muscle power within normal limits bilaterally.  Nails Thick disfigured discolored nails with subungual debris  from hallux to fifth toes bilaterally. No evidence of bacterial infection or drainage bilaterally.  Orthopedic  No limitations of motion  feet .  No crepitus or effusions noted.  No bony pathology or digital deformities noted.  Skin  normotropic skin with no porokeratosis noted bilaterally.  No signs of infections or ulcers noted.     Onychomycosis  Pain in right toes  Pain in left toes  Consent was obtained for treatment procedures.   Mechanical debridement of nails 1-5  bilaterally performed with a nail nipper.  Filed with dremel without incident.    Return office visit  3 months                    Told patient to return for periodic foot care and evaluation due to potential at risk complications.   Gardiner Barefoot DPM

## 2022-02-19 ENCOUNTER — Ambulatory Visit (INDEPENDENT_AMBULATORY_CARE_PROVIDER_SITE_OTHER): Payer: Medicare HMO

## 2022-02-19 DIAGNOSIS — I495 Sick sinus syndrome: Secondary | ICD-10-CM

## 2022-02-19 DIAGNOSIS — G4733 Obstructive sleep apnea (adult) (pediatric): Secondary | ICD-10-CM | POA: Diagnosis not present

## 2022-02-19 LAB — CUP PACEART REMOTE DEVICE CHECK
Date Time Interrogation Session: 20230725070318
Implantable Lead Implant Date: 19990330
Implantable Lead Implant Date: 19990330
Implantable Lead Location: 753859
Implantable Lead Location: 753860
Implantable Lead Model: 5068
Implantable Lead Model: 5092
Implantable Pulse Generator Implant Date: 20181024
Pulse Gen Model: 407145
Pulse Gen Serial Number: 69203177

## 2022-02-25 DIAGNOSIS — N401 Enlarged prostate with lower urinary tract symptoms: Secondary | ICD-10-CM | POA: Diagnosis not present

## 2022-02-25 DIAGNOSIS — Z86718 Personal history of other venous thrombosis and embolism: Secondary | ICD-10-CM | POA: Diagnosis not present

## 2022-02-25 DIAGNOSIS — I10 Essential (primary) hypertension: Secondary | ICD-10-CM | POA: Diagnosis not present

## 2022-03-07 ENCOUNTER — Other Ambulatory Visit (INDEPENDENT_AMBULATORY_CARE_PROVIDER_SITE_OTHER): Payer: Medicare HMO

## 2022-03-07 DIAGNOSIS — L821 Other seborrheic keratosis: Secondary | ICD-10-CM | POA: Diagnosis not present

## 2022-03-07 DIAGNOSIS — D1801 Hemangioma of skin and subcutaneous tissue: Secondary | ICD-10-CM | POA: Diagnosis not present

## 2022-03-07 DIAGNOSIS — Z85828 Personal history of other malignant neoplasm of skin: Secondary | ICD-10-CM | POA: Diagnosis not present

## 2022-03-07 DIAGNOSIS — L570 Actinic keratosis: Secondary | ICD-10-CM | POA: Diagnosis not present

## 2022-03-07 DIAGNOSIS — D509 Iron deficiency anemia, unspecified: Secondary | ICD-10-CM

## 2022-03-07 DIAGNOSIS — L918 Other hypertrophic disorders of the skin: Secondary | ICD-10-CM | POA: Diagnosis not present

## 2022-03-07 LAB — IBC + FERRITIN
Ferritin: 22.6 ng/mL (ref 22.0–322.0)
Iron: 89 ug/dL (ref 42–165)
Saturation Ratios: 23.6 % (ref 20.0–50.0)
TIBC: 376.6 ug/dL (ref 250.0–450.0)
Transferrin: 269 mg/dL (ref 212.0–360.0)

## 2022-03-07 LAB — CBC WITH DIFFERENTIAL/PLATELET
Basophils Absolute: 0.1 10*3/uL (ref 0.0–0.1)
Basophils Relative: 0.7 % (ref 0.0–3.0)
Eosinophils Absolute: 1 10*3/uL — ABNORMAL HIGH (ref 0.0–0.7)
Eosinophils Relative: 12.7 % — ABNORMAL HIGH (ref 0.0–5.0)
HCT: 37.4 % — ABNORMAL LOW (ref 39.0–52.0)
Hemoglobin: 12.8 g/dL — ABNORMAL LOW (ref 13.0–17.0)
Lymphocytes Relative: 26.9 % (ref 12.0–46.0)
Lymphs Abs: 2 10*3/uL (ref 0.7–4.0)
MCHC: 34.2 g/dL (ref 30.0–36.0)
MCV: 100.4 fl — ABNORMAL HIGH (ref 78.0–100.0)
Monocytes Absolute: 0.8 10*3/uL (ref 0.1–1.0)
Monocytes Relative: 11 % (ref 3.0–12.0)
Neutro Abs: 3.7 10*3/uL (ref 1.4–7.7)
Neutrophils Relative %: 48.7 % (ref 43.0–77.0)
Platelets: 254 10*3/uL (ref 150.0–400.0)
RBC: 3.72 Mil/uL — ABNORMAL LOW (ref 4.22–5.81)
RDW: 13.9 % (ref 11.5–15.5)
WBC: 7.6 10*3/uL (ref 4.0–10.5)

## 2022-03-18 NOTE — Progress Notes (Signed)
Remote pacemaker transmission.   

## 2022-03-22 DIAGNOSIS — G4733 Obstructive sleep apnea (adult) (pediatric): Secondary | ICD-10-CM | POA: Diagnosis not present

## 2022-04-08 DIAGNOSIS — Z125 Encounter for screening for malignant neoplasm of prostate: Secondary | ICD-10-CM | POA: Diagnosis not present

## 2022-04-08 DIAGNOSIS — R7989 Other specified abnormal findings of blood chemistry: Secondary | ICD-10-CM | POA: Diagnosis not present

## 2022-04-08 DIAGNOSIS — I10 Essential (primary) hypertension: Secondary | ICD-10-CM | POA: Diagnosis not present

## 2022-04-08 DIAGNOSIS — D649 Anemia, unspecified: Secondary | ICD-10-CM | POA: Diagnosis not present

## 2022-04-12 DIAGNOSIS — G4733 Obstructive sleep apnea (adult) (pediatric): Secondary | ICD-10-CM | POA: Diagnosis not present

## 2022-04-15 DIAGNOSIS — M545 Low back pain, unspecified: Secondary | ICD-10-CM | POA: Diagnosis not present

## 2022-04-15 DIAGNOSIS — Z0000 Encounter for general adult medical examination without abnormal findings: Secondary | ICD-10-CM | POA: Diagnosis not present

## 2022-04-15 DIAGNOSIS — R82998 Other abnormal findings in urine: Secondary | ICD-10-CM | POA: Diagnosis not present

## 2022-04-15 DIAGNOSIS — Z86718 Personal history of other venous thrombosis and embolism: Secondary | ICD-10-CM | POA: Diagnosis not present

## 2022-04-15 DIAGNOSIS — G629 Polyneuropathy, unspecified: Secondary | ICD-10-CM | POA: Diagnosis not present

## 2022-04-15 DIAGNOSIS — Z1331 Encounter for screening for depression: Secondary | ICD-10-CM | POA: Diagnosis not present

## 2022-04-15 DIAGNOSIS — D6869 Other thrombophilia: Secondary | ICD-10-CM | POA: Diagnosis not present

## 2022-04-15 DIAGNOSIS — R42 Dizziness and giddiness: Secondary | ICD-10-CM | POA: Diagnosis not present

## 2022-04-15 DIAGNOSIS — D692 Other nonthrombocytopenic purpura: Secondary | ICD-10-CM | POA: Diagnosis not present

## 2022-04-15 DIAGNOSIS — D6851 Activated protein C resistance: Secondary | ICD-10-CM | POA: Diagnosis not present

## 2022-04-15 DIAGNOSIS — N401 Enlarged prostate with lower urinary tract symptoms: Secondary | ICD-10-CM | POA: Diagnosis not present

## 2022-04-15 DIAGNOSIS — Z1389 Encounter for screening for other disorder: Secondary | ICD-10-CM | POA: Diagnosis not present

## 2022-04-15 DIAGNOSIS — I10 Essential (primary) hypertension: Secondary | ICD-10-CM | POA: Diagnosis not present

## 2022-04-22 DIAGNOSIS — G4733 Obstructive sleep apnea (adult) (pediatric): Secondary | ICD-10-CM | POA: Diagnosis not present

## 2022-05-15 ENCOUNTER — Ambulatory Visit: Payer: Medicare HMO | Admitting: Podiatry

## 2022-05-15 ENCOUNTER — Encounter: Payer: Self-pay | Admitting: Podiatry

## 2022-05-15 DIAGNOSIS — B351 Tinea unguium: Secondary | ICD-10-CM | POA: Diagnosis not present

## 2022-05-15 DIAGNOSIS — D689 Coagulation defect, unspecified: Secondary | ICD-10-CM

## 2022-05-15 DIAGNOSIS — M79674 Pain in right toe(s): Secondary | ICD-10-CM

## 2022-05-15 DIAGNOSIS — G629 Polyneuropathy, unspecified: Secondary | ICD-10-CM | POA: Diagnosis not present

## 2022-05-15 DIAGNOSIS — M79675 Pain in left toe(s): Secondary | ICD-10-CM

## 2022-05-15 NOTE — Progress Notes (Signed)
This patient returns to my office for at risk foot care.  This patient requires this care by a professional since this patient will be at risk due to having coagulation defect for which he takes xarelto. This patient is unable to cut nails himself since the patient cannot reach his nails.These nails are painful walking and wearing shoes.  This patient presents for at risk foot care today.  General Appearance  Alert, conversant and in no acute stress.  Vascular  Dorsalis pedis and posterior tibial  pulses are palpable  bilaterally.  Capillary return is within normal limits  bilaterally. Temperature is within normal limits  bilaterally.  Neurologic  Senn-Weinstein monofilament wire test within normal limits  bilaterally. Muscle power within normal limits bilaterally.  Nails Thick disfigured discolored nails with subungual debris  from hallux to fifth toes bilaterally. No evidence of bacterial infection or drainage bilaterally.  Orthopedic  No limitations of motion  feet .  No crepitus or effusions noted.  No bony pathology or digital deformities noted.  Skin  normotropic skin with no porokeratosis noted bilaterally.  No signs of infections or ulcers noted.     Onychomycosis  Pain in right toes  Pain in left toes  Consent was obtained for treatment procedures.   Mechanical debridement of nails 1-5  bilaterally performed with a nail nipper.  Filed with dremel without incident.    Return office visit  3 months                    Told patient to return for periodic foot care and evaluation due to potential at risk complications.   Gardiner Barefoot DPM

## 2022-05-16 ENCOUNTER — Encounter: Payer: Self-pay | Admitting: Neurology

## 2022-05-20 ENCOUNTER — Ambulatory Visit: Payer: Medicare HMO

## 2022-05-21 ENCOUNTER — Ambulatory Visit (INDEPENDENT_AMBULATORY_CARE_PROVIDER_SITE_OTHER): Payer: Medicare HMO

## 2022-05-21 DIAGNOSIS — I495 Sick sinus syndrome: Secondary | ICD-10-CM

## 2022-05-22 DIAGNOSIS — G4733 Obstructive sleep apnea (adult) (pediatric): Secondary | ICD-10-CM | POA: Diagnosis not present

## 2022-05-22 LAB — CUP PACEART REMOTE DEVICE CHECK
Date Time Interrogation Session: 20231023102415
Implantable Lead Connection Status: 753985
Implantable Lead Connection Status: 753985
Implantable Lead Implant Date: 19990330
Implantable Lead Implant Date: 19990330
Implantable Lead Location: 753859
Implantable Lead Location: 753860
Implantable Lead Model: 5068
Implantable Lead Model: 5092
Implantable Pulse Generator Implant Date: 20181024
Pulse Gen Model: 407145
Pulse Gen Serial Number: 69203177

## 2022-06-10 ENCOUNTER — Encounter: Payer: Self-pay | Admitting: Internal Medicine

## 2022-06-10 NOTE — Progress Notes (Signed)
Remote pacemaker transmission.   

## 2022-06-18 NOTE — Progress Notes (Signed)
Remote pacemaker transmission.   

## 2022-06-22 DIAGNOSIS — G4733 Obstructive sleep apnea (adult) (pediatric): Secondary | ICD-10-CM | POA: Diagnosis not present

## 2022-07-17 ENCOUNTER — Encounter: Payer: Self-pay | Admitting: Neurology

## 2022-07-17 ENCOUNTER — Ambulatory Visit: Payer: Medicare HMO | Admitting: Neurology

## 2022-07-17 VITALS — BP 158/80 | HR 85 | Resp 14 | Ht 67.0 in | Wt 217.0 lb

## 2022-07-17 DIAGNOSIS — G609 Hereditary and idiopathic neuropathy, unspecified: Secondary | ICD-10-CM

## 2022-07-17 DIAGNOSIS — R278 Other lack of coordination: Secondary | ICD-10-CM

## 2022-07-17 NOTE — Patient Instructions (Addendum)
You can try to reduce gabapentin '300mg'$  at bedtime x 3 nights and then stop.  If your pain gets worse, you may restart it.  We will refer you for out-patient physical therapy  Check your feet daily  Use a cane especially on uneven ground  Return to clinic 1 year

## 2022-07-17 NOTE — Progress Notes (Signed)
[facility] Clinic Note - Initial Visit   Date: 07/17/2022   Tony Cruz MRN: [id number] DOB: 25-Apr-1934   Dear Dr. Ardeth Perfect:  Thank you for your kind referral of Tony Cruz for consultation of neuropathy. Although his history is well known to you, please allow [place] to reiterate it for the purpose of our medical record. The patient was accompanied to the clinic by self.    Tony Cruz is a 88 y.o. right-handed male with hypertension, GERD, BPH, OSA, Factor V Leiden, history of SHD, and syncope due to sinus node dysfunction s/p PPM presenting for evaluation of neuropathy.   IMPRESSION/PLAN: Idiopathic peripheral neuropathy involving a length-dependent pattern which is slowly progressing over the year, as expected with the disease course.  Patient counseled on the pathology, natural course, management and prognosis of neuropathy.  Unfortunately, there is nothing available to cure or halt the progression and management remains symptomatic.  He has predominately numbness and denies significant pain.  Therefore I suggest that he try tapering off the gabapentin. If pain returns, he can restart this.  For imbalance, I will refer him to physical therapy for balance training. Patient educated on daily foot inspection, fall prevention, and safety precautions around the home.  Return to clinic in 1 year  ------------------------------------------------------------- History of present illness: Starting around 20 years ago, he began having numbness in the feet.  He saw neurologist who diagnosed him with idiopathic neuropathy.  Over the years, his neuropathy has gradually extended up his feet and into his lower legs.  Over the past year, he has developed numbness in the fingertips.  He has noticed that he is dropping things more frequently.  He takes gabapentin '300mg'$  twice daily but has not noticed that it helps.  He plays golf several times per week  and notices some imbalance. No falls and he walks unassisted.  He has a cane as needed.  No history of diabetes, alcohol, or exposure to chemotherapy.    Out-side paper records, electronic medical record, and images have been reviewed where available and summarized as:  Lab Results  Component Value Date   HGBA1C 6.1 01/23/2016   Lab Results  Component Value Date   VITAMINB12 434 01/14/2017   Lab Results  Component Value Date   TSH 2.090 11/08/2020   Lab Results  Component Value Date   ESRSEDRATE 15 07/02/2011    Past Medical History:  Diagnosis Date   Allergy    Anal fissure    Anemia    Arthritis    diffuse arthritis   Backache, unspecified    Barrett's esophagus    BPH (benign prostatic hyperplasia)    Diverticulosis of colon (without mention of hemorrhage)    DVT of leg (deep venous thrombosis) (HCC)    Esophageal reflux    Factor V Leiden deficiency (HCC)    GERD (gastroesophageal reflux disease)    hypertension    Hypertrophy of prostate with urinary obstruction and other lower urinary tract symptoms (LUTS)    Irritable bowel syndrome    Obstructive sleep apnea    Osteoarthrosis, unspecified whether generalized or localized, unspecified site    Other abnormal glucose    Overweight(278.02)    Pacemaker-MDT    Change out 2008   Sleep apnea 2016   Uses cpap   Syncope and collapse    Tubular adenoma of colon    Unspecified hereditary and idiopathic peripheral neuropathy     Past Surgical History:  Procedure Laterality Date  APPENDECTOMY     CHOLECYSTECTOMY     PACEMAKER PLACEMENT     PPM GENERATOR CHANGEOUT N/A 05/21/2017   Procedure: PPM GENERATOR CHANGEOUT;  Surgeon: Deboraha Sprang, MD;  Location: [place] [facility] [facility];  Service: Cardiovascular;  Laterality: N/A;   skin cancer removed  12/2012   removed from his face--Dr. Everett Right 12/03/2019   Procedure: TOTAL KNEE ARTHROPLASTY;  Surgeon: Sydnee Cabal,  MD;  Location: [facility];  Service: Orthopedics;  Laterality: Right;  adductor canal     Medications:  Outpatient Encounter Medications as of 07/17/2022  Medication Sig   acetaminophen (TYLENOL) 500 MG tablet Take 500-1,000 mg by mouth every 6 (six) hours as needed for headache (or pain).    ascorbic acid (VITAMIN C) 500 MG tablet Take 1 tablet by mouth daily.   calcium carbonate (OS-CAL) 600 MG TABS Take 600 mg by mouth daily.   chlorthalidone (HYGROTON) 25 MG tablet TAKE 1 TABLET DAILY   ferrous sulfate 325 (65 FE) MG tablet Take 325 mg by mouth 2 (two) times daily with a meal.    finasteride (PROSCAR) 5 MG tablet Take 1 tablet by mouth daily.   FLUZONE HIGH-DOSE QUADRIVALENT 0.7 ML SUSY    gabapentin (NEURONTIN) 300 MG capsule Take 300 mg by mouth 2 (two) times daily.   loratadine (CLARITIN) 10 MG tablet Take 10 mg by mouth daily.   losartan (COZAAR) 100 MG tablet Take 1 tablet (100 mg total) by mouth daily.   Multiple Vitamin (MULTIVITAMIN) capsule Take 1 capsule by mouth daily.   naproxen (NAPROSYN) 500 MG tablet TAKE 1 TABLET TWICE A DAY   pantoprazole (PROTONIX) 40 MG tablet TAKE ONE TABLET DAILY. MAKEAN APPOINTMENT FOR FURTHER REFILLS   tamsulosin (FLOMAX) 0.4 MG CAPS capsule Take 0.4 mg by mouth daily.   traMADol (ULTRAM) 50 MG tablet Take 50 mg by mouth 3 (three) times daily as needed.   XARELTO 15 MG TABS tablet TAKE 1 TABLET DAILY WITH   SUPPER   [DISCONTINUED] amLODipine (NORVASC) 10 MG tablet Take 1 tablet by mouth daily.   [DISCONTINUED] cholecalciferol (VITAMIN D3) 25 MCG (1000 UNIT) tablet Take 1,000 Units by mouth daily.   Facility-Administered Encounter Medications as of 07/17/2022  Medication   0.9 %  sodium chloride infusion    Allergies:  Allergies  Allergen Reactions   Shellfish Allergy Rash and Other (See Comments)    Sensitive to shellfish    Family History: Family History  Problem Relation Age of Onset   Coronary artery disease Mother    Coronary  artery disease Father        bladder cancer   Prostate cancer Father    Bladder Cancer Father    Heart Problems Father    Diabetes Sister        2 sisters   Congestive Heart Failure Sister    CVA Sister    Diabetes Sister    Colon cancer Neg Hx    Stomach cancer Neg Hx    Pancreatic cancer Neg Hx    Sleep apnea Neg Hx     Social History: Social History   Tobacco Use   Smoking status: Never   Smokeless tobacco: Never  Vaping Use   Vaping Use: Never used  Substance Use Topics   Alcohol use: Yes    Alcohol/week: 0.0 standard drinks of alcohol    Comment: occasionally    Drug use: No   Social History  Social History Narrative   Not on file    Vital Signs:  BP (!) 158/80   Pulse 85   Resp 14   Ht '5\' 7"'$  (1.702 m)   Wt 217 lb (98.4 kg)   SpO2 95%   BMI 33.99 kg/m   Neurological Exam: MENTAL STATUS including orientation to time, place, person, recent and remote memory, attention span and concentration, language, and fund of knowledge is normal.  Speech is not dysarthric.  CRANIAL NERVES: II:  No visual field defects.    III-IV-VI: Pupils equal round and reactive to light.  Normal conjugate, extra-ocular eye movements in all directions of gaze.  No nystagmus.  No ptosis.   V:  Normal facial sensation.    VII:  Normal facial symmetry and movements.   VIII:  Normal hearing and vestibular function.   IX-X:  Normal palatal movement.   XI:  Normal shoulder shrug and head rotation.   XII:  Normal tongue strength and range of motion, no deviation or fasciculation.  MOTOR:  No atrophy, fasciculations or abnormal movements.  No pronator drift.   Upper Extremity:  Right  Left  Deltoid  5/5   5/5   Biceps  5/5   5/5   Triceps  5/5   5/5   Infraspinatus 5/5  5/5  Medial pectoralis 5/5  5/5  Wrist extensors  5/5   5/5   Wrist flexors  5/5   5/5   Finger extensors  5/5   5/5   Finger flexors  5/5   5/5   Dorsal interossei  5/5   5/5   Abductor pollicis  5/5   5/5    Tone (Ashworth scale)  0  0   Lower Extremity:  Right  Left  Hip flexors  5/5   5/5   Hip extensors  5/5   5/5   Adductor 5/5  5/5  Abductor 5/5  5/5  Knee flexors  5/5   5/5   Knee extensors  5/5   5/5   Dorsiflexors  5/5   5/5   Plantarflexors  5/5   5/5   Toe extensors  5/5   5/5   Toe flexors  5/5   5/5   Tone (Ashworth scale)  0  0   MSRs:                                           Right        Left brachioradialis 2+  2+  biceps 2+  2+  triceps 2+  2+  patellar tr  tr  ankle jerk 0  0   SENSORY:  Vibration reduced to ~ 20% at the knees and absent below the ankles.  Pin prick and temperature reduced over the mid-calf distally.  Sensation is intact in the hands.   Romberg's sign positive.   COORDINATION/GAIT: Normal finger-to- nose-finger  Intact rapid alternating movements bilaterally.  Gait is mildly wide-based, stable, and unassisted.     Thank you for allowing me to participate in patient's care.  If I can answer any additional questions, I would be pleased to do so.    Sincerely,    Robson Trickey K. Posey Pronto, DO

## 2022-07-22 DIAGNOSIS — G4733 Obstructive sleep apnea (adult) (pediatric): Secondary | ICD-10-CM | POA: Diagnosis not present

## 2022-08-12 ENCOUNTER — Encounter: Payer: Medicare HMO | Admitting: Internal Medicine

## 2022-08-16 ENCOUNTER — Encounter: Payer: Self-pay | Admitting: Physical Therapy

## 2022-08-16 ENCOUNTER — Ambulatory Visit: Payer: Medicare Other | Attending: Neurology | Admitting: Physical Therapy

## 2022-08-16 ENCOUNTER — Other Ambulatory Visit: Payer: Self-pay

## 2022-08-16 DIAGNOSIS — M6281 Muscle weakness (generalized): Secondary | ICD-10-CM | POA: Diagnosis present

## 2022-08-16 DIAGNOSIS — R2689 Other abnormalities of gait and mobility: Secondary | ICD-10-CM

## 2022-08-16 DIAGNOSIS — R278 Other lack of coordination: Secondary | ICD-10-CM | POA: Diagnosis not present

## 2022-08-16 DIAGNOSIS — R2681 Unsteadiness on feet: Secondary | ICD-10-CM | POA: Diagnosis not present

## 2022-08-16 DIAGNOSIS — G609 Hereditary and idiopathic neuropathy, unspecified: Secondary | ICD-10-CM | POA: Diagnosis not present

## 2022-08-16 NOTE — Therapy (Signed)
OUTPATIENT PHYSICAL THERAPY NEURO EVALUATION   Patient Name: Tony Cruz MRN: [id number] DOB:1934-02-03, 89 y.o., male Today's Date: 08/16/2022   PCP: Velna Hatchet, MD REFERRING PROVIDER: Alda Berthold, DO   END OF SESSION:  PT End of Session - 08/16/22 0850     Visit Number 1    Number of Visits 5    Date for PT Re-Evaluation 09/13/22    Authorization Type [employer] Medicare    PT Start Time 7829    PT Stop Time 0936    PT Time Calculation (min) 50 min    Equipment Utilized During Treatment Gait belt    Activity Tolerance Patient tolerated treatment well    Behavior During Therapy WFL for tasks assessed/performed             Past Medical History:  Diagnosis Date   Allergy    Anal fissure    Anemia    Arthritis    diffuse arthritis   Backache, unspecified    Barrett's esophagus    BPH (benign prostatic hyperplasia)    Diverticulosis of colon (without mention of hemorrhage)    DVT of leg (deep venous thrombosis) (HCC)    Esophageal reflux    Factor V Leiden deficiency (HCC)    GERD (gastroesophageal reflux disease)    hypertension    Hypertrophy of prostate with urinary obstruction and other lower urinary tract symptoms (LUTS)    Irritable bowel syndrome    Obstructive sleep apnea    Osteoarthrosis, unspecified whether generalized or localized, unspecified site    Other abnormal glucose    Overweight(278.02)    Pacemaker-MDT    Change out 2008   Sleep apnea 2016   Uses cpap   Syncope and collapse    Tubular adenoma of colon    Unspecified hereditary and idiopathic peripheral neuropathy    Past Surgical History:  Procedure Laterality Date   APPENDECTOMY     CHOLECYSTECTOMY     PACEMAKER PLACEMENT     PPM GENERATOR CHANGEOUT N/A 05/21/2017   Procedure: PPM GENERATOR CHANGEOUT;  Surgeon: Deboraha Sprang, MD;  Location: [facility];  Service: Cardiovascular;  Laterality: N/A;   skin cancer removed  12/2012   removed from his face--Dr.  [place] Right 12/03/2019   Procedure: TOTAL KNEE ARTHROPLASTY;  Surgeon: Sydnee Cabal, MD;  Location: [facility];  Service: Orthopedics;  Laterality: Right;  adductor canal   Patient Active Problem List   Diagnosis Date Noted   Osteoarthritis of right knee 12/03/2019   Pain in right knee 12/30/2017   Iron deficiency anemia 12/11/2017   Toe ulcer ([place]) 01/13/2017   Heterozygous factor V Leiden mutation ([place]) 01/13/2017   DVT (deep venous thrombosis) ([place]) 08/22/2016   History of skull fracture 10/19/2015   History of subdural hematoma 10/19/2015   Headache(784.0) 09/30/2013   Sinoatrial node dysfunction ([place]) 06/01/2013   Pacemaker-MDT 07/02/2011   Night sweats 07/02/2011   DIZZINESS 03/22/2010   Impaired fasting glucose 11/02/2009   Obstructive sleep apnea 11/21/2008   Backache 11/03/2008   Overweight 11/02/2007   Essential hypertension 11/02/2007   ALLERGIC RHINITIS 11/02/2007   GASTROESOPHAGEAL REFLUX DISEASE 11/02/2007   Diverticulosis of large intestine 11/02/2007   Benign prostatic hyperplasia with urinary obstruction 11/02/2007   SYNCOPE 11/02/2007   Hereditary and idiopathic peripheral neuropathy 10/30/2007   IRRITABLE BOWEL SYNDROME 10/30/2007   Osteoarthritis 10/30/2007    ONSET DATE: 07/17/22 MD referral  REFERRING DIAG:  G60.9 (ICD-10-CM) - Hereditary and idiopathic peripheral neuropathy  R27.8 (ICD-10-CM) - Sensory ataxia    THERAPY DIAG:  Unsteadiness on feet  Other abnormalities of gait and mobility  Muscle weakness (generalized)  Rationale for Evaluation and Treatment: Rehabilitation  SUBJECTIVE:                                                                                                                                                                                             SUBJECTIVE STATEMENT: Saw Dr. Posey Pronto for neuropathy.  Have some weakness, burning in lower leg and feet, and I've started to have  some numbness in fingers.  Feel off balance from time to time, but have not fallen.  Play golf several times per week and notice the balance challenges them.   Pt accompanied by: self  PERTINENT HISTORY: See PMH above  PAIN:  Are you having pain? Yes: NPRS scale: 2-3/10 Pain location: hands Pain description: prickly, sharp Aggravating factors: unsure Relieving factors: unsure  PRECAUTIONS: Fall  WEIGHT BEARING RESTRICTIONS: No  FALLS: Has patient fallen in last 6 months? No  LIVING ENVIRONMENT: Lives with: lives with their spouse Lives in: House/apartment Stairs: Yes: External: 2-3 steps; on right going up Has following equipment at home: Single point cane  PLOF: Independent and Leisure: enjoys playing golf several times per week  PATIENT GOALS: Pt's goals for therapy are to improve balance  OBJECTIVE:   DIAGNOSTIC FINDINGS: NA  COGNITION: Overall cognitive status: Within functional limits for tasks assessed   SENSATION: Light touch: Impaired   POSTURE: No Significant postural limitations  LOWER EXTREMITY ROM:   A/ROM WFL in sitting   LOWER EXTREMITY MMT:    MMT Right Eval Left Eval  Hip flexion 4 4  Hip extension    Hip abduction    Hip adduction    Hip internal rotation    Hip external rotation    Knee flexion 5 5  Knee extension 5 5  Ankle dorsiflexion 3+ 4  Ankle plantarflexion 3 3  Ankle inversion 3+ 3+  Ankle eversion 4 4  (Blank rows = not tested)  TRANSFERS: Assistive device utilized: None  Sit to stand: Modified independence Stand to sit: Modified independence  STAIRS: Level of Assistance: Modified independence Stair Negotiation Technique: Alternating Pattern  with Bilateral Rails Number of Stairs: 3  Height of Stairs: 4"-6"    GAIT: Gait pattern: step through pattern, trendelenburg, and wide BOS Distance walked: 50 ft x 2 Assistive device utilized: None Level of assistance: Complete Independence   FUNCTIONAL TESTS:  5 times  sit to stand: 8.87 sec Timed up and go (TUG): 12.78  Dynamic Gait Index: NT  M-CTSIB  Condition 1: Firm Surface, EO 30 Sec, Normal Sway  Condition 2: Firm Surface, EC 30 Sec, Mild Sway  Condition 3: Foam Surface, EO 30 Sec, Mild Sway  Condition 4: Foam Surface, EC 21.72 Sec, Severe Sway    Functional Gait Assessment:  19/30   [facility] PT Assessment - 08/16/22 0001       Functional Gait  Assessment   Gait assessed  Yes    Gait Level Surface Walks 20 ft in less than 7 sec but greater than 5.5 sec, uses assistive device, slower speed, mild gait deviations, or deviates 6-10 in outside of the 12 in walkway width.   6.34   Change in Gait Speed Able to smoothly change walking speed without loss of balance or gait deviation. Deviate no more than 6 in outside of the 12 in walkway width.    Gait with Horizontal Head Turns Performs head turns smoothly with no change in gait. Deviates no more than 6 in outside 12 in walkway width    Gait with Vertical Head Turns Performs task with slight change in gait velocity (eg, minor disruption to smooth gait path), deviates 6 - 10 in outside 12 in walkway width or uses assistive device    Gait and Pivot Turn Pivot turns safely within 3 sec and stops quickly with no loss of balance.    Step Over Obstacle Is able to step over one shoe box (4.5 in total height) but must slow down and adjust steps to clear box safely. May require verbal cueing.    Gait with Narrow Base of Support Ambulates less than 4 steps heel to toe or cannot perform without assistance.    Gait with Eyes Closed Walks 20 ft, slow speed, abnormal gait pattern, evidence for imbalance, deviates 10-15 in outside 12 in walkway width. Requires more than 9 sec to ambulate 20 ft.   11.91 sec   Ambulating Backwards Walks 20 ft, uses assistive device, slower speed, mild gait deviations, deviates 6-10 in outside 12 in walkway width.    Steps Alternating feet, must use rail.    Total Score 19    FGA comment:  Scores <22/30 indicate increased fall risk              TODAY'S TREATMENT:                                                                                                                              DATE: 08/16/2022    PATIENT EDUCATION: Education details: Eval results, POC, HEP initiated Person educated: Patient Education method: Explanation, Demonstration, and Handouts Education comprehension: verbalized understanding, returned demonstration, and needs further education  HOME EXERCISE PROGRAM: Access Code: [email or web address] URL: [email or web address] Date: 08/16/2022 Prepared by: [facility]  Exercises - Single Leg Stance with Support  - 1 x daily - 5 x weekly - 1 sets - 3 reps -  10-15 sec hold - Heel Toe Raises with Counter Support  - 1 x daily - 5 x weekly - 3 sets - 5 reps - Seated Ankle Dorsiflexion with Resistance  - 1 x daily - 7 x weekly - 3 sets - 10 reps - 3 sec hold  GOALS: Goals reviewed with patient? Yes  SHORT TERM GOALS: same as LTGs   LONG TERM GOALS: Target date: 09/13/2022  Pt will be independent with HEP for improved balance, strength, mobility. Baseline:  Goal status: INITIAL  2.  Pt will improve FGA score to at least 22/30 to decrease fall risk. Baseline: 19/30  Goal status: INITIAL  3.  Pt will improve Condition 4 on MCTSIB to 30 seconds with moderate or less sway, indicating improved vestibular system use for balance. Baseline: 21.72 sec severe sway Goal status: INITIAL  4.  Pt will ambulate outdoor surfaces, at least 500 ft, using cane, mod independently for improved safety on unlevel surfaces with assistive device. Baseline: Not yet using cane Goal status: INITIAL   ASSESSMENT:  CLINICAL IMPRESSION: Patient is an 89 y.o. male who was seen today for physical therapy evaluation and treatment for neuropathy and balance.  He has not had any falls in the past 6 months, but does note unsteadiness on  the golf course and in darkness/unlevel surfaces.  He presents with decreased sensation, decreased strength, decreased balance, decreased vestibular system use for balance, decreased gait stability.  He is at increased fall risk per FGA score of 19/30.  He is active with golfing several times per week.  He would benefit from skilled PT to address the above stated deficits for decreased fall risk and improved overall functional mobility.  OBJECTIVE IMPAIRMENTS: Abnormal gait, decreased balance, decreased knowledge of use of DME, difficulty walking, and decreased strength.   ACTIVITY LIMITATIONS: bending, standing, stairs, and locomotion level  PARTICIPATION LIMITATIONS: shopping, community activity, yard work, and golfing  PERSONAL FACTORS: 3+ comorbidities: see PMH above  are also affecting patient's functional outcome.   REHAB POTENTIAL: Good  CLINICAL DECISION MAKING: Evolving/moderate complexity  EVALUATION COMPLEXITY: Moderate  PLAN:  PT FREQUENCY: 1x/week  PT DURATION: other: 5 weeks, including eval week  PLANNED INTERVENTIONS: Therapeutic exercises, Therapeutic activity, Neuromuscular re-education, Balance training, Gait training, Patient/Family education, Self Care, and DME instructions  PLAN FOR NEXT SESSION: Review initial HEP; add to HEP-corner balance exercises, tandem stance, SLS activities   Alphonsa Brickle W., PT 08/16/2022, 9:58 AM  [facility] [address] [place], [place] [address] Phone # [phone] Fax # [phone]

## 2022-08-19 ENCOUNTER — Encounter: Payer: Self-pay | Admitting: Podiatry

## 2022-08-19 ENCOUNTER — Ambulatory Visit (INDEPENDENT_AMBULATORY_CARE_PROVIDER_SITE_OTHER): Payer: Medicare Other | Admitting: Podiatry

## 2022-08-19 DIAGNOSIS — B351 Tinea unguium: Secondary | ICD-10-CM

## 2022-08-19 DIAGNOSIS — G629 Polyneuropathy, unspecified: Secondary | ICD-10-CM | POA: Diagnosis not present

## 2022-08-19 DIAGNOSIS — M2042 Other hammer toe(s) (acquired), left foot: Secondary | ICD-10-CM

## 2022-08-19 DIAGNOSIS — M79675 Pain in left toe(s): Secondary | ICD-10-CM

## 2022-08-19 DIAGNOSIS — D689 Coagulation defect, unspecified: Secondary | ICD-10-CM | POA: Diagnosis not present

## 2022-08-19 DIAGNOSIS — M79674 Pain in right toe(s): Secondary | ICD-10-CM

## 2022-08-19 DIAGNOSIS — M2041 Other hammer toe(s) (acquired), right foot: Secondary | ICD-10-CM | POA: Diagnosis not present

## 2022-08-19 NOTE — Progress Notes (Signed)
This patient returns to my office for at risk foot care.  This patient requires this care by a professional since this patient will be at risk due to having coagulation defect for which he takes xarelto.  This patient is unable to cut nails himself since the patient cannot reach his nails.These nails are painful walking and wearing shoes.  This patient presents for at risk foot care today.  General Appearance  Alert, conversant and in no acute stress.  Vascular  Dorsalis pedis and posterior tibial  pulses are palpable  bilaterally.  Capillary return is within normal limits  bilaterally. Temperature is within normal limits  bilaterally.  Neurologic  Senn-Weinstein monofilament wire test within normal limits  bilaterally. Muscle power within normal limits bilaterally.  Nails Thick disfigured discolored nails with subungual debris  from hallux to fifth toes bilaterally. No evidence of bacterial infection or drainage bilaterally.  Orthopedic  No limitations of motion  feet .  No crepitus or effusions noted.  No bony pathology or digital deformities noted.  Skin  normotropic skin with no porokeratosis noted bilaterally.  No signs of infections or ulcers noted.     Onychomycosis  Pain in right toes  Pain in left toes  Consent was obtained for treatment procedures.   Mechanical debridement of nails 1-5  bilaterally performed with a nail nipper.  Filed with dremel without incident.    Return office visit  10 weeks                  Told patient to return for periodic foot care and evaluation due to potential at risk complications.   Gardiner Barefoot DPM

## 2022-08-20 ENCOUNTER — Ambulatory Visit: Payer: Medicare Other | Attending: Internal Medicine

## 2022-08-20 ENCOUNTER — Ambulatory Visit: Payer: Medicare Other

## 2022-08-20 DIAGNOSIS — I495 Sick sinus syndrome: Secondary | ICD-10-CM | POA: Diagnosis not present

## 2022-08-20 LAB — CUP PACEART REMOTE DEVICE CHECK
Date Time Interrogation Session: 20240123110020
Implantable Lead Connection Status: 753985
Implantable Lead Connection Status: 753985
Implantable Lead Implant Date: 19990330
Implantable Lead Implant Date: 19990330
Implantable Lead Location: 753859
Implantable Lead Location: 753860
Implantable Lead Model: 5068
Implantable Lead Model: 5092
Implantable Pulse Generator Implant Date: 20181024
Pulse Gen Model: 407145
Pulse Gen Serial Number: 69203177

## 2022-08-27 ENCOUNTER — Ambulatory Visit: Payer: Medicare Other | Admitting: Physical Therapy

## 2022-08-28 ENCOUNTER — Encounter: Payer: Medicare Other | Admitting: Internal Medicine

## 2022-08-28 DIAGNOSIS — Z950 Presence of cardiac pacemaker: Secondary | ICD-10-CM

## 2022-08-28 DIAGNOSIS — I495 Sick sinus syndrome: Secondary | ICD-10-CM

## 2022-08-28 NOTE — Progress Notes (Incomplete)
Patient Care Team: Velna Hatchet, MD as PCP - General (Internal Medicine) Deboraha Sprang, MD as PCP - Electrophysiology (Cardiology) Deboraha Sprang, MD as PCP - Cardiology (Cardiology)   HPI  Tony Cruz is a 89 y.o. male is seen in followup for syncope secondary to intermittent sinus node dysfunction (presumed neurally) for which a pacemaker-Biotronik was remotely implanted w/ change out 2008, 2018.   DATE TEST EF   10/17 CTA  Ao 42 cm  5/22 Echo   60-65% %              Date Cr K Hgb  6/19 0.86 4.3 14.5  1/20  0.77 4.3 13.0  7/20 0.9 4.3 13.8  4/22 1.02 4.1 13.2  8/22  4.7 13.2   Xarelto for DVT and F V Leiden       Today, he says that he's been well. Marland Kitchen   He says that over the summer in 81 degree weather, while he was golfing, he says that he would start to feel faint. His diastolic pressure would have a lower number like in the 50's, today it was in the 60's. He states that he tries to stay hydrated with 4 bottles of water.   He reports also is heart rate has increased over the decades.  The patient denies chest pain, shortness of breath, nocturnal dyspnea, orthopnea .  There has dental appointment abnormal on tomorrow at 11, how about 930 been no palpitations or syncope.   He has a little swelling in lower extremities. He sleeps in his flat bed with pillows under his legs. He used CPAP machine when he sleeps which removes his snoring.  His diet excludes salt, bread. He has bacon and cheese.     Past Medical History:  Diagnosis Date   Allergy    Anal fissure    Anemia    Arthritis    diffuse arthritis   Backache, unspecified    Barrett's esophagus    BPH (benign prostatic hyperplasia)    Diverticulosis of colon (without mention of hemorrhage)    DVT of leg (deep venous thrombosis) (HCC)    Esophageal reflux    Factor V Leiden deficiency (HCC)    GERD (gastroesophageal reflux disease)    hypertension    Hypertrophy of prostate with  urinary obstruction and other lower urinary tract symptoms (LUTS)    Irritable bowel syndrome    Obstructive sleep apnea    Osteoarthrosis, unspecified whether generalized or localized, unspecified site    Other abnormal glucose    Overweight(278.02)    Pacemaker-MDT    Change out 2008   Sleep apnea 2016   Uses cpap   Syncope and collapse    Tubular adenoma of colon    Unspecified hereditary and idiopathic peripheral neuropathy     Past Surgical History:  Procedure Laterality Date   APPENDECTOMY     CHOLECYSTECTOMY     PACEMAKER PLACEMENT     PPM GENERATOR CHANGEOUT N/A 05/21/2017   Procedure: PPM GENERATOR CHANGEOUT;  Surgeon: Deboraha Sprang, MD;  Location: [facility];  Service: Cardiovascular;  Laterality: N/A;   skin cancer removed  12/2012   removed from his face--Dr. [place] Right 12/03/2019   Procedure: TOTAL KNEE ARTHROPLASTY;  Surgeon: Sydnee Cabal, MD;  Location: [facility];  Service: Orthopedics;  Laterality: Right;  adductor canal    Current Outpatient Medications  Medication Sig Dispense  Refill   acetaminophen (TYLENOL) 500 MG tablet Take 500-1,000 mg by mouth every 6 (six) hours as needed for headache (or pain).      ascorbic acid (VITAMIN C) 500 MG tablet Take 1 tablet by mouth daily.     calcium carbonate (OS-CAL) 600 MG TABS Take 600 mg by mouth daily.     chlorthalidone (HYGROTON) 25 MG tablet TAKE 1 TABLET DAILY 90 tablet 2   ferrous sulfate 325 (65 FE) MG tablet Take 325 mg by mouth 2 (two) times daily with a meal.      finasteride (PROSCAR) 5 MG tablet Take 1 tablet by mouth daily.     FLUZONE HIGH-DOSE QUADRIVALENT 0.7 ML SUSY      gabapentin (NEURONTIN) 300 MG capsule Take 300 mg by mouth 2 (two) times daily.     loratadine (CLARITIN) 10 MG tablet Take 10 mg by mouth daily.     losartan (COZAAR) 100 MG tablet Take 1 tablet (100 mg total) by mouth daily. 90 tablet 3   Multiple Vitamin (MULTIVITAMIN)  capsule Take 1 capsule by mouth daily.     naproxen (NAPROSYN) 500 MG tablet TAKE 1 TABLET TWICE A DAY 180 tablet 3   pantoprazole (PROTONIX) 40 MG tablet TAKE ONE TABLET DAILY. MAKEAN APPOINTMENT FOR FURTHER REFILLS 90 tablet 3   tamsulosin (FLOMAX) 0.4 MG CAPS capsule Take 0.4 mg by mouth daily.     traMADol (ULTRAM) 50 MG tablet Take 50 mg by mouth 3 (three) times daily as needed.     XARELTO 15 MG TABS tablet TAKE 1 TABLET DAILY WITH   SUPPER 90 tablet 0   Current Facility-Administered Medications  Medication Dose Route Frequency Provider Last Rate Last Admin   0.9 %  sodium chloride infusion  500 mL Intravenous Continuous Pyrtle, Lajuan Lines, MD        Allergies  Allergen Reactions   Shellfish Allergy Rash and Other (See Comments)    Sensitive to shellfish    Review of Systems negative except from HPI and PMH Please see the history of present illness. (+) Lightheadedness  (+)  All other systems are reviewed and negative.     Physical Exam There were no vitals taken for this visit. Well developed and nourished in no acute distress HENT normal Neck supple with JVP-  flat *** Clear Regular rate and rhythm, no murmurs or gallops Abd-soft with active BS No Clubbing cyanosis edema Skin-warm and dry A & Oriented  Grossly normal sensory and motor function  ECG ***   07/31/2021 ECG: Atrial pacing at 87 Intervals 20/14/40 Right bundle branch block Right axis deviation 12/21 QRSd 134 ms and axis 34 1/21 QRSd 108 ms and axis -27  Assessment and plan  Syncope presumed neurally mediated  Sinus node dysfunction-prob neurally   Pacemaker Biotonik    Hypertension   Balance Neuropathy  DVT---- Factor V Leiden  Right bundle branch block-and posterior fascicular block-progressive  Edema is much improved off the amlodipine last year.  We will continue him on chlorthalidone 25.  Electrolytes were normal  Blood pressures are well controlled.  Diastolics are little bit low.   Continue losartan 100 mg.  I suspect that the lightheadedness when he was playing golf and it was 90 was just the excessive heat and that suggested that he not play when the temperature is above 90 or is exceedingly humid, and that if he insists on playing that he should use a golf cart   On Xarelto 15 for his  DVT and factor V Leiden  Progressive conduction system disease.  At this point it is relatively immaterial as he is back up bradycardia pacing.  The issue or interestingly though is why is this true.  No anemia.  No carpal tunnel to suggest amyloid.  We will continue to follow this.

## 2022-09-03 ENCOUNTER — Encounter: Payer: Self-pay | Admitting: Physical Therapy

## 2022-09-03 ENCOUNTER — Other Ambulatory Visit: Payer: Self-pay

## 2022-09-03 ENCOUNTER — Ambulatory Visit: Payer: Medicare Other | Attending: Neurology | Admitting: Physical Therapy

## 2022-09-03 DIAGNOSIS — M6281 Muscle weakness (generalized): Secondary | ICD-10-CM | POA: Diagnosis present

## 2022-09-03 DIAGNOSIS — R2681 Unsteadiness on feet: Secondary | ICD-10-CM | POA: Diagnosis present

## 2022-09-03 DIAGNOSIS — R2689 Other abnormalities of gait and mobility: Secondary | ICD-10-CM | POA: Insufficient documentation

## 2022-09-03 MED ORDER — LOSARTAN POTASSIUM 100 MG PO TABS: 100.0000 mg | ORAL_TABLET | Freq: Every day | ORAL | 0 refills | Status: DC

## 2022-09-03 NOTE — Therapy (Signed)
OUTPATIENT PHYSICAL THERAPY NEURO TREATMENT NOTE   Patient Name: Tony Cruz MRN: [id number] DOB:05-Sep-1933, 89 y.o., male Today's Date: 09/03/2022   PCP: Velna Hatchet, MD REFERRING PROVIDER: Alda Berthold, DO   END OF SESSION:  PT End of Session - 09/03/22 1318     Visit Number 2    Number of Visits 5    Date for PT Re-Evaluation 09/13/22    Authorization Type UHC Medicare    PT Start Time 1320    PT Stop Time 0737    PT Time Calculation (min) 38 min    Equipment Utilized During Treatment Gait belt    Activity Tolerance Patient tolerated treatment well    Behavior During Therapy WFL for tasks assessed/performed              Past Medical History:  Diagnosis Date   Allergy    Anal fissure    Anemia    Arthritis    diffuse arthritis   Backache, unspecified    Barrett's esophagus    BPH (benign prostatic hyperplasia)    Diverticulosis of colon (without mention of hemorrhage)    DVT of leg (deep venous thrombosis) (HCC)    Esophageal reflux    Factor V Leiden deficiency (HCC)    GERD (gastroesophageal reflux disease)    hypertension    Hypertrophy of prostate with urinary obstruction and other lower urinary tract symptoms (LUTS)    Irritable bowel syndrome    Obstructive sleep apnea    Osteoarthrosis, unspecified whether generalized or localized, unspecified site    Other abnormal glucose    Overweight(278.02)    Pacemaker-MDT    Change out 2008   Sleep apnea 2016   Uses cpap   Syncope and collapse    Tubular adenoma of colon    Unspecified hereditary and idiopathic peripheral neuropathy    Past Surgical History:  Procedure Laterality Date   APPENDECTOMY     CHOLECYSTECTOMY     PACEMAKER PLACEMENT     PPM GENERATOR CHANGEOUT N/A 05/21/2017   Procedure: PPM GENERATOR CHANGEOUT;  Surgeon: Deboraha Sprang, MD;  Location: [place] [facility];  Service: Cardiovascular;  Laterality: N/A;   skin cancer removed  12/2012   removed from his face--Dr.  [place] Right 12/03/2019   Procedure: TOTAL KNEE ARTHROPLASTY;  Surgeon: Sydnee Cabal, MD;  Location: [facility];  Service: Orthopedics;  Laterality: Right;  adductor canal   Patient Active Problem List   Diagnosis Date Noted   Osteoarthritis of right knee 12/03/2019   Pain in right knee 12/30/2017   Iron deficiency anemia 12/11/2017   Toe ulcer ([place]) 01/13/2017   Heterozygous factor V Leiden mutation ([place]) 01/13/2017   DVT (deep venous thrombosis) ([place]) 08/22/2016   History of skull fracture 10/19/2015   History of subdural hematoma 10/19/2015   Headache(784.0) 09/30/2013   Sinoatrial node dysfunction ([place]) 06/01/2013   Pacemaker-MDT 07/02/2011   Night sweats 07/02/2011   DIZZINESS 03/22/2010   Impaired fasting glucose 11/02/2009   Obstructive sleep apnea 11/21/2008   Backache 11/03/2008   Overweight 11/02/2007   Essential hypertension 11/02/2007   ALLERGIC RHINITIS 11/02/2007   GASTROESOPHAGEAL REFLUX DISEASE 11/02/2007   Diverticulosis of large intestine 11/02/2007   Benign prostatic hyperplasia with urinary obstruction 11/02/2007   SYNCOPE 11/02/2007   Hereditary and idiopathic peripheral neuropathy 10/30/2007   IRRITABLE BOWEL SYNDROME 10/30/2007   Osteoarthritis 10/30/2007    ONSET DATE: 07/17/22 MD referral  REFERRING  DIAG:  G60.9 (ICD-10-CM) - Hereditary and idiopathic peripheral neuropathy  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  Been doing a lot around home.   Reports most pain is in his hands and he would like to pursue OT due to losing grip and hand pain associated with neuropathy. Pt accompanied by: self  PERTINENT HISTORY: See PMH above  PAIN:  Are you having pain? Yes: NPRS scale: 2-3/10 Pain location: hands Pain description: prickly, sharp Aggravating factors: unsure Relieving factors: unsure  PRECAUTIONS: Fall  WEIGHT BEARING RESTRICTIONS: No  FALLS: Has patient fallen in last 6 months? No  LIVING ENVIRONMENT: Lives with: lives with their spouse Lives in: House/apartment Stairs: Yes: External: 2-3 steps; on right going up Has following equipment at home: Single point cane  PLOF: Independent and Leisure: enjoys playing golf several times per week  PATIENT GOALS: Pt's goals for therapy are to improve balance  OBJECTIVE:    TODAY'S TREATMENT: 09/03/2022 Activity Comments  Reviewed HEP from last visit, with minimal cues for technique with resisted ankle dorsiflexion   Sidestepping>resisted sidestepping with red band Cues for posture throughout standing exercises  Forward marching 4 reps along counter   Forward tandem gait along counter x 4 reps   Tandem stance, 2 x 30 sec each foot position   Feet apart/feet together, EO head turns/nods then EC-performed on solid surface and on Airex Min guard/ light UE support        Access Code: [email or web address] URL: [email or web address] Date: 09/03/2022 Prepared by: [facility]  Exercises - Single Leg Stance with Support  - 1 x daily - 5 x weekly - 1 sets - 3 reps - 10-15 sec hold - Heel Toe Raises with Counter Support  - 1 x daily - 5 x weekly - 3 sets - 5 reps - Seated Ankle Dorsiflexion with Resistance  - 1 x daily - 7 x weekly - 3 sets - 10 reps - 3 sec hold - Standing Balance in Corner  - 1 x daily - 5 x weekly - 1 sets - 5 reps - Standing Balance in Corner with Eyes Closed  -  1 x daily - 5 x weekly - 1 sets - 3 reps - 30 sec hold -  Corner Balance Feet Together With Eyes Open  - 1 x daily - 5 x weekly - 1 sets - 5 reps - Romberg Stance with Eyes Closed  - 1 x daily - 5 x weekly - 1 sets - 3 reps - 30 sec hold  PATIENT EDUCATION: Education details: HEP additions Person educated: Patient Education method: Consulting civil engineer, Demonstration, and Handouts Education comprehension: verbalized understanding, returned demonstration, and needs further education  ------------------------------------------------------------------------- Objective measures below taken at initial evaluation:  DIAGNOSTIC FINDINGS: NA  COGNITION: Overall cognitive status: Within functional limits for tasks assessed   SENSATION: Light touch: Impaired   POSTURE: No Significant postural limitations  LOWER EXTREMITY ROM:   A/ROM WFL in sitting   LOWER EXTREMITY MMT:    MMT Right Eval Left Eval  Hip flexion 4 4  Hip extension    Hip abduction    Hip adduction    Hip internal rotation    Hip external rotation    Knee flexion 5 5  Knee extension 5 5  Ankle dorsiflexion 3+ 4  Ankle plantarflexion 3 3  Ankle inversion 3+ 3+  Ankle eversion 4 4  (Blank rows = not tested)  TRANSFERS: Assistive device utilized: None  Sit to stand: Modified independence Stand to sit: Modified independence  STAIRS: Level of Assistance: Modified independence Stair Negotiation Technique: Alternating Pattern  with Bilateral Rails Number of Stairs: 3  Height of Stairs: 4"-6"    GAIT: Gait pattern: step through pattern, trendelenburg, and wide BOS Distance walked: 50 ft x 2 Assistive device utilized: None Level of assistance: Complete Independence   FUNCTIONAL TESTS:  5 times sit to stand: 8.87 sec Timed up and go (TUG): 12.78 Dynamic Gait Index: NT  M-CTSIB  Condition 1: Firm Surface, EO 30 Sec, Normal Sway  Condition 2: Firm Surface, EC 30 Sec, Mild Sway  Condition 3: Foam Surface, EO 30 Sec, Mild Sway  Condition 4: Foam Surface, EC 21.72 Sec,  Severe Sway    Functional Gait Assessment:  19/30      TODAY'S TREATMENT:                                                                                                                              DATE: 08/16/2022    PATIENT EDUCATION: Education details: Eval results, POC, HEP initiated Person educated: Patient Education method: Explanation, Demonstration, and Handouts Education comprehension: verbalized understanding, returned demonstration, and needs further education  HOME EXERCISE PROGRAM: Access Code: [email or web address] URL: [email or web address] Date: 08/16/2022 Prepared by: [facility]  Exercises - Single Leg Stance with Support  - 1 x daily - 5 x weekly - 1 sets - 3 reps - 10-15 sec hold - Heel Toe Raises with Counter Support  - 1 x daily - 5 x weekly - 3 sets - 5 reps -  Seated Ankle Dorsiflexion with Resistance  - 1 x daily - 7 x weekly - 3 sets - 10 reps - 3 sec hold  GOALS: Goals reviewed with patient? Yes  SHORT TERM GOALS: same as LTGs   LONG TERM GOALS: Target date: 09/13/2022  Pt will be independent with HEP for improved balance, strength, mobility. Baseline:  Goal status: IN PROGRESS  2.  Pt will improve FGA score to at least 22/30 to decrease fall risk. Baseline: 19/30  Goal status: IN PROGRESS  3.  Pt will improve Condition 4 on MCTSIB to 30 seconds with moderate or less sway, indicating improved vestibular system use for balance. Baseline: 21.72 sec severe sway Goal status: IN PROGRESS  4.  Pt will ambulate outdoor surfaces, at least 500 ft, using cane, mod independently for improved safety on unlevel surfaces with assistive device. Baseline: Not yet using cane Goal status: IN PROGRESS   ASSESSMENT:  CLINICAL IMPRESSION: Pt returns today for first visit after eval, as he has had conflicts due to wife's health concerns.  Reviewed HEP and progressed to include standing balance with varied foot positions  with head motions and with eyes closed.  He is most challenged with narrowed BOS with EC on solid and compliant surfaces.  He expresses interest in the possibility of OT due to difficulty with grip and sensation with pain in hands from neuropathy.  He will continue to benefit from skilled PT towards goals for improved balance and gait.  OBJECTIVE IMPAIRMENTS: Abnormal gait, decreased balance, decreased knowledge of use of DME, difficulty walking, and decreased strength.   ACTIVITY LIMITATIONS: bending, standing, stairs, and locomotion level  PARTICIPATION LIMITATIONS: shopping, community activity, yard work, and golfing  PERSONAL FACTORS: 3+ comorbidities: see PMH above  are also affecting patient's functional outcome.   REHAB POTENTIAL: Good  CLINICAL DECISION MAKING: Evolving/moderate complexity  EVALUATION COMPLEXITY: Moderate  PLAN:  PT FREQUENCY: 1x/week  PT DURATION: other: 5 weeks, including eval week  PLANNED INTERVENTIONS: Therapeutic exercises, Therapeutic activity, Neuromuscular re-education, Balance training, Gait training, Patient/Family education, Self Care, and DME instructions  PLAN FOR NEXT SESSION: Review additions to HEP; add to HEP-variable surfaces, tandem stance, SLS activities as appropriate.  Gait training for longer, outdoor distances with cane/walking pole   Timica Marcom W., PT 09/03/2022, 2:05 PM  [facility] at [facility] [place], [place], [place] [address] Phone # [phone] Fax # [phone]

## 2022-09-04 ENCOUNTER — Telehealth: Payer: Self-pay | Admitting: Physical Therapy

## 2022-09-04 DIAGNOSIS — M6281 Muscle weakness (generalized): Secondary | ICD-10-CM

## 2022-09-04 NOTE — Telephone Encounter (Signed)
Dear Dr. Posey Pronto, I have evaluated and begun treatment for PT for Tony Cruz for peripheral neuropathy/balance.  His significant complaint is the pain, sensation issues in his hands due to neuropathy that make it hard for him to grip and maintain hold of items with ADLs.  He would benefit from OT to evaluate and treat.  If you agree, could you please write order for OT eval and treat?  Thank you.  Mady Haagensen, PT 09/04/22 12:59 PM Phone: [phone] Fax: [phone]  [facility] at [facility] [place], [place], [place] [address] Phone # [phone] Fax # [phone]

## 2022-09-05 ENCOUNTER — Other Ambulatory Visit: Payer: Self-pay

## 2022-09-05 DIAGNOSIS — I10 Essential (primary) hypertension: Secondary | ICD-10-CM

## 2022-09-05 DIAGNOSIS — R609 Edema, unspecified: Secondary | ICD-10-CM

## 2022-09-05 MED ORDER — LOSARTAN POTASSIUM 100 MG PO TABS: 100.0000 mg | ORAL_TABLET | Freq: Every day | ORAL | 0 refills | Status: DC

## 2022-09-05 MED ORDER — CHLORTHALIDONE 25 MG PO TABS: 25.0000 mg | ORAL_TABLET | Freq: Every day | ORAL | 0 refills | Status: DC

## 2022-09-10 ENCOUNTER — Ambulatory Visit: Payer: Medicare Other

## 2022-09-10 DIAGNOSIS — M6281 Muscle weakness (generalized): Secondary | ICD-10-CM

## 2022-09-10 DIAGNOSIS — R2681 Unsteadiness on feet: Secondary | ICD-10-CM

## 2022-09-10 DIAGNOSIS — R2689 Other abnormalities of gait and mobility: Secondary | ICD-10-CM

## 2022-09-10 NOTE — Therapy (Signed)
OUTPATIENT PHYSICAL THERAPY NEURO TREATMENT NOTE   Patient Name: Tony Cruz MRN: [id number] DOB:Dec 18, 1933, 89 y.o., male Today's Date: 09/10/2022   PCP: Velna Hatchet, MD REFERRING PROVIDER: Alda Berthold, DO   END OF SESSION:  PT End of Session - 09/10/22 1313     Visit Number 3    Number of Visits 5    Date for PT Re-Evaluation 09/13/22    Authorization Type UHC Medicare    PT Start Time 1315    PT Stop Time 1400    PT Time Calculation (min) 45 min    Equipment Utilized During Treatment Gait belt    Activity Tolerance Patient tolerated treatment well    Behavior During Therapy WFL for tasks assessed/performed              Past Medical History:  Diagnosis Date   Allergy    Anal fissure    Anemia    Arthritis    diffuse arthritis   Backache, unspecified    Barrett's esophagus    BPH (benign prostatic hyperplasia)    Diverticulosis of colon (without mention of hemorrhage)    DVT of leg (deep venous thrombosis) (HCC)    Esophageal reflux    Factor V Leiden deficiency (HCC)    GERD (gastroesophageal reflux disease)    hypertension    Hypertrophy of prostate with urinary obstruction and other lower urinary tract symptoms (LUTS)    Irritable bowel syndrome    Obstructive sleep apnea    Osteoarthrosis, unspecified whether generalized or localized, unspecified site    Other abnormal glucose    Overweight(278.02)    Pacemaker-MDT    Change out 2008   Sleep apnea 2016   Uses cpap   Syncope and collapse    Tubular adenoma of colon    Unspecified hereditary and idiopathic peripheral neuropathy    Past Surgical History:  Procedure Laterality Date   APPENDECTOMY     CHOLECYSTECTOMY     PACEMAKER PLACEMENT     PPM GENERATOR CHANGEOUT N/A 05/21/2017   Procedure: PPM GENERATOR CHANGEOUT;  Surgeon: Deboraha Sprang, MD;  Location: [place] [facility];  Service: Cardiovascular;  Laterality: N/A;   skin cancer removed  12/2012   removed from his face--Dr.  [place] Right 12/03/2019   Procedure: TOTAL KNEE ARTHROPLASTY;  Surgeon: Sydnee Cabal, MD;  Location: [facility];  Service: Orthopedics;  Laterality: Right;  adductor canal   Patient Active Problem List   Diagnosis Date Noted   Osteoarthritis of right knee 12/03/2019   Pain in right knee 12/30/2017   Iron deficiency anemia 12/11/2017   Toe ulcer ([place]) 01/13/2017   Heterozygous factor V Leiden mutation ([place]) 01/13/2017   DVT (deep venous thrombosis) ([place]) 08/22/2016   History of skull fracture 10/19/2015   History of subdural hematoma 10/19/2015   Headache(784.0) 09/30/2013   Sinoatrial node dysfunction ([place]) 06/01/2013   Pacemaker-MDT 07/02/2011   Night sweats 07/02/2011   DIZZINESS 03/22/2010   Impaired fasting glucose 11/02/2009   Obstructive sleep apnea 11/21/2008   Backache 11/03/2008   Overweight 11/02/2007   Essential hypertension 11/02/2007   ALLERGIC RHINITIS 11/02/2007   GASTROESOPHAGEAL REFLUX DISEASE 11/02/2007   Diverticulosis of large intestine 11/02/2007   Benign prostatic hyperplasia with urinary obstruction 11/02/2007   SYNCOPE 11/02/2007   Hereditary and idiopathic peripheral neuropathy 10/30/2007   IRRITABLE BOWEL SYNDROME 10/30/2007   Osteoarthritis 10/30/2007    ONSET DATE: 07/17/22 MD referral  REFERRING  DIAG:  G60.9 (ICD-10-CM) - Hereditary and idiopathic peripheral neuropathy  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 Yes: NPRS scale: 2-3/10 Pain location: hands Pain description: prickly, sharp Aggravating factors: unsure Relieving factors: unsure  PRECAUTIONS: Fall  WEIGHT BEARING RESTRICTIONS: No  FALLS: Has patient fallen in last 6 months? No  LIVING ENVIRONMENT: Lives with: lives with their spouse Lives in: House/apartment Stairs: Yes: External: 2-3 steps; on right going up Has following equipment at home: Single point cane  PLOF: Independent and Leisure: enjoys playing golf several times per week  PATIENT GOALS: Pt's goals for therapy are to improve balance  OBJECTIVE:   TODAY'S TREATMENT: 09/10/22 Activity Comments  Hep review -SLS 1x15 sec  -heel toe raise 2x10 -corner balance activities 1x15 sec   Semi-tandem 1x15 sec EO/EC ( able to make it 7 sec EC)  balance -feet together, EC and head turns 2x5--added to HEP  Standing on foam -EO x 30 sec, EC x 30 sec--moderate sway -offset standing PNF chops 2x10 4.4 lbs ball (cue for eye on ball for VOR cancellation) -ball arc 1x10 4.4 lbs--eye on the ball  NU-step resistance intervals x 8 min For CV training; 30 sec heavy; 1 min light. HR of 118 bpm during efforts.          TODAY'S TREATMENT: 09/03/2022 Activity Comments  Reviewed HEP from last visit, with minimal cues for technique with resisted ankle dorsiflexion   Sidestepping>resisted sidestepping with red band Cues for posture throughout standing exercises  Forward marching 4 reps along counter   Forward tandem gait along counter x 4 reps   Tandem stance, 2 x 30 sec each foot position   Feet apart/feet together, EO head turns/nods then EC-performed on solid surface and on Airex Min guard/ light UE support        Access Code: [email or web address] URL: [email or web address] Date: 09/03/2022 Prepared by: [facility]  Exercises - Single Leg Stance with Support  - 1 x daily - 5 x weekly - 1 sets - 3 reps - 10-15 sec  hold -  Heel Toe Raises with Counter Support  - 1 x daily - 5 x weekly - 3 sets - 5 reps - Seated Ankle Dorsiflexion with Resistance  - 1 x daily - 7 x weekly - 3 sets - 10 reps - 3 sec hold - Standing Balance in Corner  - 1 x daily - 5 x weekly - 1 sets - 5 reps - Standing Balance in Corner with Eyes Closed  - 1 x daily - 5 x weekly - 1 sets - 3 reps - 30 sec hold - Corner Balance Feet Together With Eyes Open  - 1 x daily - 5 x weekly - 1 sets - 5 reps - Romberg Stance with Eyes Closed  - 1 x daily - 5 x weekly - 1 sets - 3 reps - 30 sec hold - Corner Balance Feet Together: Eyes Open With Head Turns  - 1 x daily - 7 x weekly - 3 sets - 3-5 reps - Corner Balance Feet Together: Eyes Closed With Head Turns  - 1 x daily - 7 x weekly - 3 sets - 3-5 reps  PATIENT EDUCATION: Education details: HEP additions Person educated: Patient Education method: Consulting civil engineer, Demonstration, and Handouts Education comprehension: verbalized understanding, returned demonstration, and needs further education  ------------------------------------------------------------------------- Objective measures below taken at initial evaluation:  DIAGNOSTIC FINDINGS: NA  COGNITION: Overall cognitive status: Within functional limits for tasks assessed   SENSATION: Light touch: Impaired   POSTURE: No Significant postural limitations  LOWER EXTREMITY ROM:   A/ROM WFL in sitting   LOWER EXTREMITY MMT:    MMT Right Eval Left Eval  Hip flexion 4 4  Hip extension    Hip abduction    Hip adduction    Hip internal rotation    Hip external rotation    Knee flexion 5 5  Knee extension 5 5  Ankle dorsiflexion 3+ 4  Ankle plantarflexion 3 3  Ankle inversion 3+ 3+  Ankle eversion 4 4  (Blank rows = not tested)  TRANSFERS: Assistive device utilized: None  Sit to stand: Modified independence Stand to sit: Modified independence  STAIRS: Level of Assistance: Modified independence Stair Negotiation Technique:  Alternating Pattern  with Bilateral Rails Number of Stairs: 3  Height of Stairs: 4"-6"    GAIT: Gait pattern: step through pattern, trendelenburg, and wide BOS Distance walked: 50 ft x 2 Assistive device utilized: None Level of assistance: Complete Independence   FUNCTIONAL TESTS:  5 times sit to stand: 8.87 sec Timed up and go (TUG): 12.78 Dynamic Gait Index: NT  M-CTSIB  Condition 1: Firm Surface, EO 30 Sec, Normal Sway  Condition 2: Firm Surface, EC 30 Sec, Mild Sway  Condition 3: Foam Surface, EO 30 Sec, Mild Sway  Condition 4: Foam Surface, EC 21.72 Sec, Severe Sway    Functional Gait Assessment:  19/30        GOALS: Goals reviewed with patient? Yes  SHORT TERM GOALS: same as LTGs   LONG TERM GOALS: Target date: 09/13/2022  Pt will be independent with HEP for improved balance, strength, mobility. Baseline:  Goal status: IN PROGRESS  2.  Pt will improve FGA score to at least 22/30 to decrease fall risk. Baseline: 19/30  Goal status: IN PROGRESS  3.  Pt will improve Condition 4 on MCTSIB to 30 seconds with moderate or less sway, indicating improved vestibular system use for balance. Baseline: 21.72 sec severe sway Goal status: IN PROGRESS  4.  Pt will ambulate outdoor surfaces, at  least 500 ft, using cane, mod independently for improved safety on unlevel surfaces with assistive device. Baseline: Not yet using cane Goal status: IN PROGRESS   ASSESSMENT:  CLINICAL IMPRESSION: Able to perform HEP quite well and demo improved balance on compliant surface eyes closed with mild-mod sway condition 4 M-CTSIB. Addition of balance activities for HEP to include head movements as balance drills during session reveal difficulty with maintaining balance with eye/head movements, e.g. tracking ball movements during coordination/balance. Training in HIIT for cardiovascular training with explanation of HR max regarding his underlying cardiac hx including pacemaker. Continued  sessions to advance POC details  OBJECTIVE IMPAIRMENTS: Abnormal gait, decreased balance, decreased knowledge of use of DME, difficulty walking, and decreased strength.   ACTIVITY LIMITATIONS: bending, standing, stairs, and locomotion level  PARTICIPATION LIMITATIONS: shopping, community activity, yard work, and golfing  PERSONAL FACTORS: 3+ comorbidities: see PMH above  are also affecting patient's functional outcome.   REHAB POTENTIAL: Good  CLINICAL DECISION MAKING: Evolving/moderate complexity  EVALUATION COMPLEXITY: Moderate  PLAN:  PT FREQUENCY: 1x/week  PT DURATION: other: 5 weeks, including eval week  PLANNED INTERVENTIONS: Therapeutic exercises, Therapeutic activity, Neuromuscular re-education, Balance training, Gait training, Patient/Family education, Self Care, and DME instructions  PLAN FOR NEXT SESSION: Review additions to HEP; add to HEP-variable surfaces, tandem stance, SLS activities as appropriate.  Gait training for longer, outdoor distances with cane/walking pole  1:53 PM, 09/10/22 M. Sherlyn Lees, PT, DPT Physical Therapist- [place] Office Number: [phone]   [place] at [facility] [address], [place], [place] [address] Phone # [phone] Fax # [phone]

## 2022-09-11 ENCOUNTER — Other Ambulatory Visit: Payer: Self-pay

## 2022-09-11 DIAGNOSIS — D509 Iron deficiency anemia, unspecified: Secondary | ICD-10-CM

## 2022-09-12 ENCOUNTER — Other Ambulatory Visit (INDEPENDENT_AMBULATORY_CARE_PROVIDER_SITE_OTHER): Payer: Medicare Other

## 2022-09-12 DIAGNOSIS — D509 Iron deficiency anemia, unspecified: Secondary | ICD-10-CM

## 2022-09-12 LAB — CBC WITH DIFFERENTIAL/PLATELET
Basophils Absolute: 0.1 10*3/uL (ref 0.0–0.1)
Basophils Relative: 1.3 % (ref 0.0–3.0)
Eosinophils Absolute: 1 10*3/uL — ABNORMAL HIGH (ref 0.0–0.7)
Eosinophils Relative: 13 % — ABNORMAL HIGH (ref 0.0–5.0)
HCT: 40.4 % (ref 39.0–52.0)
Hemoglobin: 13.8 g/dL (ref 13.0–17.0)
Lymphocytes Relative: 28.1 % (ref 12.0–46.0)
Lymphs Abs: 2.1 10*3/uL (ref 0.7–4.0)
MCHC: 34.2 g/dL (ref 30.0–36.0)
MCV: 99.2 fl (ref 78.0–100.0)
Monocytes Absolute: 0.9 10*3/uL (ref 0.1–1.0)
Monocytes Relative: 11.6 % (ref 3.0–12.0)
Neutro Abs: 3.4 10*3/uL (ref 1.4–7.7)
Neutrophils Relative %: 46 % (ref 43.0–77.0)
Platelets: 279 10*3/uL (ref 150.0–400.0)
RBC: 4.08 Mil/uL — ABNORMAL LOW (ref 4.22–5.81)
RDW: 14.1 % (ref 11.5–15.5)
WBC: 7.4 10*3/uL (ref 4.0–10.5)

## 2022-09-12 LAB — IBC + FERRITIN
Ferritin: 29.4 ng/mL (ref 22.0–322.0)
Iron: 96 ug/dL (ref 42–165)
Saturation Ratios: 26.4 % (ref 20.0–50.0)
TIBC: 364 ug/dL (ref 250.0–450.0)
Transferrin: 260 mg/dL (ref 212.0–360.0)

## 2022-09-17 NOTE — Progress Notes (Signed)
Remote pacemaker transmission.   

## 2022-09-19 ENCOUNTER — Other Ambulatory Visit: Payer: Self-pay

## 2022-09-19 DIAGNOSIS — I10 Essential (primary) hypertension: Secondary | ICD-10-CM

## 2022-09-19 DIAGNOSIS — R609 Edema, unspecified: Secondary | ICD-10-CM

## 2022-09-19 MED ORDER — LOSARTAN POTASSIUM 100 MG PO TABS: 100.0000 mg | ORAL_TABLET | Freq: Every day | ORAL | 0 refills | Status: DC

## 2022-09-19 MED ORDER — CHLORTHALIDONE 25 MG PO TABS: 25.0000 mg | ORAL_TABLET | Freq: Every day | ORAL | 0 refills | Status: DC

## 2022-09-28 ENCOUNTER — Other Ambulatory Visit: Payer: Self-pay | Admitting: Internal Medicine

## 2022-10-04 NOTE — Therapy (Signed)
[facility] [place], [place], [address] Phone: [phone]   Fax:  [phone]  Patient Details  Name: Tony Cruz MRN: [id number] Date of Birth: 06/21/1934 Referring Provider:  Velna Hatchet, MD  Encounter Date: 09/12/2022 PHYSICAL THERAPY DISCHARGE SUMMARY  Visits from Start of Care: 3  Current functional level related to goals / functional outcomes: Unable to determine   Remaining deficits: N/A   Education / Equipment: HEP initiated   Patient agrees to discharge. Patient goals were not met. Patient is being discharged due to not returning since the last visit.   9:13 AM, 10/04/22 M. Sherlyn Lees, PT, DPT Physical Therapist- [place] Office Number: [phone]

## 2022-10-16 ENCOUNTER — Encounter: Payer: Self-pay | Admitting: Internal Medicine

## 2022-10-16 ENCOUNTER — Other Ambulatory Visit: Payer: Self-pay | Admitting: *Deleted

## 2022-10-16 ENCOUNTER — Telehealth: Payer: Self-pay | Admitting: Family Medicine

## 2022-10-16 ENCOUNTER — Ambulatory Visit: Payer: Medicare Other | Attending: Internal Medicine | Admitting: Internal Medicine

## 2022-10-16 VITALS — BP 102/56 | HR 81 | Ht 67.0 in | Wt 217.8 lb

## 2022-10-16 DIAGNOSIS — R55 Syncope and collapse: Secondary | ICD-10-CM

## 2022-10-16 DIAGNOSIS — G4733 Obstructive sleep apnea (adult) (pediatric): Secondary | ICD-10-CM

## 2022-10-16 DIAGNOSIS — I429 Cardiomyopathy, unspecified: Secondary | ICD-10-CM

## 2022-10-16 DIAGNOSIS — I4510 Unspecified right bundle-branch block: Secondary | ICD-10-CM | POA: Diagnosis not present

## 2022-10-16 DIAGNOSIS — I951 Orthostatic hypotension: Secondary | ICD-10-CM

## 2022-10-16 DIAGNOSIS — I495 Sick sinus syndrome: Secondary | ICD-10-CM | POA: Diagnosis not present

## 2022-10-16 DIAGNOSIS — Z950 Presence of cardiac pacemaker: Secondary | ICD-10-CM

## 2022-10-16 MED ORDER — LOSARTAN POTASSIUM 100 MG PO TABS: 50.0000 mg | ORAL_TABLET | Freq: Every day | ORAL | 3 refills | Status: DC

## 2022-10-16 NOTE — Patient Instructions (Signed)
Medication Instructions:  Your physician has recommended you make the following change in your medication:   Decrease your Losartan 100mg  to 1/2 tablet by mouth daily.   *If you need a refill on your cardiac medications before your next appointment, please call your pharmacy*   Lab Work: Multiple Myeloma Panel  If you have labs (blood work) drawn today and your tests are completely normal, you will receive your results only by: [place] (if you have MyChart) OR A paper copy in the mail If you have any lab test that is abnormal or we need to change your treatment, we will call you to review the results.   Testing/Procedures:  PYP scan to be scheduled   Follow-Up: At [facility], you and your health needs are our priority.  As part of our continuing mission to provide you with exceptional heart care, we have created designated Provider Care Teams.  These Care Teams include your primary Cardiologist (physician) and Advanced Practice Providers (APPs -  Physician Assistants and Nurse Practitioners) who all work together to provide you with the care you need, when you need it.  We recommend signing up for the patient portal called "MyChart".  Sign up information is provided on this After Visit Summary.  MyChart is used to connect with patients for Virtual Visits (Telemedicine).  Patients are able to view lab/test results, encounter notes, upcoming appointments, etc.  Non-urgent messages can be sent to your provider as well.   To learn more about what you can do with MyChart, go to [email or web address].    Your next appointment:   12 months with Dr Caryl Comes

## 2022-10-16 NOTE — Telephone Encounter (Signed)
Pt called. [facility] is no longer accepting [facility]. Pt is requesting new order be sent to DME company that will accept his insurance.

## 2022-10-16 NOTE — Telephone Encounter (Signed)
Placed TOC order to [facility]. Sent community message to them that order placed today. Please call pt and let him know we are going to try and get him transferred to [place]  DME: [place] Phone: [phone], press option 1 Fax: [phone]

## 2022-10-16 NOTE — Progress Notes (Signed)
Patient Care Team: Velna Hatchet, MD as PCP - General (Internal Medicine) Deboraha Sprang, MD as PCP - Electrophysiology (Cardiology) Deboraha Sprang, MD as PCP - Cardiology (Cardiology)   HPI  Tony Cruz is a 89 y.o. male is seen in followup for syncope secondary to intermittent sinus node dysfunction (presumed neurally) for which a pacemaker-Biotronik was remotely implanted w/ change out 2008, 2018.   DATE TEST EF   10/17 CTA  Ao 42 cm  5/22 Echo   60-65% %              Date Cr K Hgb  6/19 0.86 4.3 14.5  1/20  0.77 4.3 13.0  7/20 0.9 4.3 13.8  4/22 1.02 4.1 13.2  8/22  4.7 13.2  2/24 1.2 3.7 13.8   Xarelto for DVT and F V Leiden    No interval syncope.  Does have some lightheadedness, he notes that when he sits down with playing golf, the biggest new issue is worsening neuropathy, dropping things with his hands.  Initially involved his feet.  Losing weight, but his wife has been ill so he has been cooking more probably eating less        Past Medical History:  Diagnosis Date   Allergy    Anal fissure    Anemia    Arthritis    diffuse arthritis   Backache, unspecified    Barrett's esophagus    BPH (benign prostatic hyperplasia)    Diverticulosis of colon (without mention of hemorrhage)    DVT of leg (deep venous thrombosis) (HCC)    Esophageal reflux    Factor V Leiden deficiency (HCC)    GERD (gastroesophageal reflux disease)    hypertension    Hypertrophy of prostate with urinary obstruction and other lower urinary tract symptoms (LUTS)    Irritable bowel syndrome    Obstructive sleep apnea    Osteoarthrosis, unspecified whether generalized or localized, unspecified site    Other abnormal glucose    Overweight(278.02)    Pacemaker-MDT    Change out 2008   Sleep apnea 2016   Uses cpap   Syncope and collapse    Tubular adenoma of colon    Unspecified hereditary and idiopathic peripheral neuropathy     Past Surgical History:   Procedure Laterality Date   APPENDECTOMY     CHOLECYSTECTOMY     PACEMAKER PLACEMENT     PPM GENERATOR CHANGEOUT N/A 05/21/2017   Procedure: PPM GENERATOR CHANGEOUT;  Surgeon: Deboraha Sprang, MD;  Location: [facility];  Service: Cardiovascular;  Laterality: N/A;   skin cancer removed  12/2012   removed from his face--Dr. [place] Right 12/03/2019   Procedure: TOTAL KNEE ARTHROPLASTY;  Surgeon: Sydnee Cabal, MD;  Location: [facility];  Service: Orthopedics;  Laterality: Right;  adductor canal    Current Outpatient Medications  Medication Sig Dispense Refill   acetaminophen (TYLENOL) 500 MG tablet Take 500-1,000 mg by mouth every 6 (six) hours as needed for headache (or pain).      ascorbic acid (VITAMIN C) 500 MG tablet Take 1 tablet by mouth daily.     calcium carbonate (OS-CAL) 600 MG TABS Take 600 mg by mouth daily.     chlorthalidone (HYGROTON) 25 MG tablet Take 1 tablet (25 mg total) by mouth daily. 90 tablet 0   ferrous sulfate 325 (65 FE) MG tablet Take 325 mg by mouth 2 (  two) times daily with a meal.      finasteride (PROSCAR) 5 MG tablet Take 1 tablet by mouth daily.     FLUZONE HIGH-DOSE QUADRIVALENT 0.7 ML SUSY      gabapentin (NEURONTIN) 300 MG capsule Take 300 mg by mouth 2 (two) times daily.     loratadine (CLARITIN) 10 MG tablet Take 10 mg by mouth daily.     losartan (COZAAR) 100 MG tablet Take 1 tablet (100 mg total) by mouth daily. 90 tablet 0   Multiple Vitamin (MULTIVITAMIN) capsule Take 1 capsule by mouth daily.     naproxen (NAPROSYN) 500 MG tablet TAKE 1 TABLET TWICE A DAY 180 tablet 3   pantoprazole (PROTONIX) 40 MG tablet TAKE ONE TABLET DAILY. MAKEAN APPOINTMENT FOR FURTHER REFILLS 90 tablet 3   tamsulosin (FLOMAX) 0.4 MG CAPS capsule Take 0.4 mg by mouth daily.     traMADol (ULTRAM) 50 MG tablet Take 50 mg by mouth 3 (three) times daily as needed.     XARELTO 15 MG TABS tablet TAKE 1 TABLET DAILY WITH   SUPPER  90 tablet 0   Current Facility-Administered Medications  Medication Dose Route Frequency Provider Last Rate Last Admin   0.9 %  sodium chloride infusion  500 mL Intravenous Continuous Pyrtle, Lajuan Lines, MD        Allergies  Allergen Reactions   Shellfish Allergy Rash and Other (See Comments)    Sensitive to shellfish    Review of Systems negative except from HPI and PMH Please see the history of present illness. (+) Lightheadedness  (+)  All other systems are reviewed and negative.     Physical Exam BP (!) 102/56   Pulse 81   Ht 5\' 7"  (1.702 m)   Wt 217 lb 12.8 oz (98.8 kg)   SpO2 94%   BMI 34.11 kg/m  Well developed and well nourished in no acute distress HENT normal Neck supple Clear Device pocket well healed; without hematoma or erythema.  There is no tethering  Regular rate and rhythm, no  gallop No murmur Abd-soft with active BS No Clubbing cyanosis  edema Skin-warm and dry A & Oriented  Grossly normal sensory and motor function Thenar wasting on the left but thumb opposition weakness on the right  ECG sinus at 81 Interval 16/13/39 Right bundle branch block QRSd is 130 ms  Device function is  normal.  Programming changes none  See Paceart for details      07/31/2021 ECG: Atrial pacing at 87 Intervals 20/14/40 Right bundle branch block Right axis deviation 12/21 QRSd 134 ms and axis 34 1/21 QRSd 108 ms and axis -27  Assessment and plan  Syncope presumed neurally mediated  Sinus node dysfunction-prob neurally   Pacemaker Biotonik    Hypertension   Balance Neuropathy  DVT---- Factor V Leiden  Right bundle branch block-and posterior fascicular block-progressive  Peripheral neuropathy   Edema remains resolved off of amlodipine.  Continue on the chlorthalidone.  Blood pressures are well-controlled.  Will decrease his losartan from 100--50 mg as his blood pressure today was 105.  on repeated exam  With progressive conduction system disease and  hand weakness with orthostatic hypotension and now progressive peripheral neuropathy; amyloid is on the differential of the latter so we will undertake a myeloma scan and a PYP scan

## 2022-10-17 NOTE — Telephone Encounter (Signed)
Called pt. Informed him of message nurse Emma sent,  Placed TOC order to [facility]. Sent community message to them that order placed today. Please call pt and let him know we are going to try and get him transferred to [place]   DME: [place] Phone: [phone], press option 1 Fax: [phone]    Pt said good deal. I will just wait for them to call me. Pt thank me for calling him.

## 2022-10-18 ENCOUNTER — Ambulatory Visit: Payer: Medicare Other

## 2022-10-22 LAB — MULTIPLE MYELOMA CASCADE WITH REFLEX TO SIFE AND SFLC

## 2022-10-24 LAB — MULTIPLE MYELOMA CASCADE WITH REFLEX TO SIFE AND SFLC
A/G Ratio: 1.2 (ref 0.7–1.7)
Albumin ELP: 3.3 g/dL (ref 2.9–4.4)
Alpha 1: 0.2 g/dL (ref 0.0–0.4)
Alpha 2: 0.6 g/dL (ref 0.4–1.0)
Beta: 1 g/dL (ref 0.7–1.3)
Gamma Globulin: 0.8 g/dL (ref 0.4–1.8)
Globulin, Total: 2.7 g/dL (ref 2.2–3.9)
Reflex Testing: 0
Total Protein: 6 g/dL (ref 6.0–8.5)

## 2022-10-24 LAB — COMMENT:

## 2022-10-24 LAB — FREE K+L LT CHAINS,QN,S
Ig Kappa Free Light Chain: 30.8 mg/L — ABNORMAL HIGH (ref 3.3–19.4)
Ig Lambda Free Light Chain: 22 mg/L (ref 5.7–26.3)
KAPPA/LAMBDA RATIO: 1.4 (ref 0.26–1.65)

## 2022-10-28 ENCOUNTER — Ambulatory Visit: Payer: Medicare Other | Admitting: Podiatry

## 2022-11-03 ENCOUNTER — Encounter: Payer: Self-pay | Admitting: Internal Medicine

## 2022-11-03 DIAGNOSIS — E859 Amyloidosis, unspecified: Secondary | ICD-10-CM

## 2022-11-11 ENCOUNTER — Other Ambulatory Visit: Payer: Self-pay | Admitting: Internal Medicine

## 2022-11-11 DIAGNOSIS — R609 Edema, unspecified: Secondary | ICD-10-CM

## 2022-11-11 DIAGNOSIS — I10 Essential (primary) hypertension: Secondary | ICD-10-CM

## 2022-11-13 NOTE — Telephone Encounter (Signed)
Consider ordering urine immunofixation (uIFE) to rule out  Amyloidosis (AL).   This from [facility] And a resonable thing  I should have included it from the beginning  Thanks SK

## 2022-11-18 LAB — IMMUNOFIXATION, URINE

## 2022-11-19 ENCOUNTER — Ambulatory Visit (INDEPENDENT_AMBULATORY_CARE_PROVIDER_SITE_OTHER): Payer: Medicare Other

## 2022-11-19 DIAGNOSIS — I495 Sick sinus syndrome: Secondary | ICD-10-CM

## 2022-11-20 ENCOUNTER — Other Ambulatory Visit: Payer: Self-pay | Admitting: Internal Medicine

## 2022-11-20 ENCOUNTER — Telehealth (HOSPITAL_COMMUNITY): Payer: Self-pay | Admitting: *Deleted

## 2022-11-20 LAB — CUP PACEART REMOTE DEVICE CHECK
Battery Voltage: 60
Date Time Interrogation Session: 20240423081558
Implantable Lead Connection Status: 753985
Implantable Lead Connection Status: 753985
Implantable Lead Implant Date: 19990330
Implantable Lead Implant Date: 19990330
Implantable Lead Location: 753859
Implantable Lead Location: 753860
Implantable Lead Model: 5068
Implantable Lead Model: 5092
Implantable Pulse Generator Implant Date: 20181024
Pulse Gen Model: 407145
Pulse Gen Serial Number: 69203177

## 2022-11-20 NOTE — Telephone Encounter (Signed)
Reminder call given for upcoming Amyloid study on 11/25/22 at 12:30

## 2022-11-21 ENCOUNTER — Ambulatory Visit (INDEPENDENT_AMBULATORY_CARE_PROVIDER_SITE_OTHER): Payer: Medicare Other

## 2022-11-21 ENCOUNTER — Ambulatory Visit: Payer: Medicare Other | Admitting: Podiatry

## 2022-11-21 DIAGNOSIS — M2041 Other hammer toe(s) (acquired), right foot: Secondary | ICD-10-CM | POA: Diagnosis not present

## 2022-11-21 DIAGNOSIS — L84 Corns and callosities: Secondary | ICD-10-CM

## 2022-11-21 DIAGNOSIS — M2042 Other hammer toe(s) (acquired), left foot: Secondary | ICD-10-CM

## 2022-11-21 DIAGNOSIS — L02612 Cutaneous abscess of left foot: Secondary | ICD-10-CM

## 2022-11-21 DIAGNOSIS — G629 Polyneuropathy, unspecified: Secondary | ICD-10-CM

## 2022-11-21 DIAGNOSIS — L03032 Cellulitis of left toe: Secondary | ICD-10-CM

## 2022-11-21 DIAGNOSIS — L97522 Non-pressure chronic ulcer of other part of left foot with fat layer exposed: Secondary | ICD-10-CM | POA: Diagnosis not present

## 2022-11-21 DIAGNOSIS — D689 Coagulation defect, unspecified: Secondary | ICD-10-CM

## 2022-11-21 MED ORDER — DOXYCYCLINE HYCLATE 100 MG PO TABS: 100.0000 mg | ORAL_TABLET | Freq: Two times a day (BID) | ORAL | 0 refills | Status: AC

## 2022-11-21 NOTE — Progress Notes (Signed)
Subjective:  Patient ID: Tony Cruz, male    DOB: 10-25-1933,  MRN: [id number]  Chief Complaint  Patient presents with   Toe Pain    Noticed that toe was red and swollen a few days ago. 2/10 pain level. Has not noticed any drainage.     89 y.o. male presents with concern for ulceration to the left second toe.  He also notes some redness and swelling.  He said that he previously had some callus trimmed at this area by Dr. Stacie Acres and thinks that it may have got a little too deep.  Possibly got infected.  He denies any nausea vomiting fever chills.  He does have a history of neuropathy.  Past Medical History:  Diagnosis Date   Allergy    Anal fissure    Anemia    Arthritis    diffuse arthritis   Backache, unspecified    Barrett's esophagus    BPH (benign prostatic hyperplasia)    Diverticulosis of colon (without mention of hemorrhage)    DVT of leg (deep venous thrombosis) (HCC)    Esophageal reflux    Factor V Leiden deficiency (HCC)    GERD (gastroesophageal reflux disease)    hypertension    Hypertrophy of prostate with urinary obstruction and other lower urinary tract symptoms (LUTS)    Irritable bowel syndrome    Obstructive sleep apnea    Osteoarthrosis, unspecified whether generalized or localized, unspecified site    Other abnormal glucose    Overweight(278.02)    Pacemaker-MDT    Change out 2008   Sleep apnea 2016   Uses cpap   Syncope and collapse    Tubular adenoma of colon    Unspecified hereditary and idiopathic peripheral neuropathy     Allergies  Allergen Reactions   Shellfish Allergy Rash and Other (See Comments)    Sensitive to shellfish    ROS: Negative except as per HPI above  Objective:  General: AAO x3, NAD  Dermatological: Ulceration present at the medial aspect of the left second toe ulceration does probe deep at that area.  Probes down to near capsule or bone.  No drainage however there is erythema and edema of the distal tuft of the  toe.  See clinical picture below.  Vascular:  Dorsalis Pedis artery and Posterior Tibial artery pedal pulses are 1/4 bilateral.  Capillary fill time < 3 sec to all digits.   Neruologic: Grossly diminished/absent to light touch of the toes of the left foot  Musculoskeletal: Hallux valgus and hand toe deformity noted to the left foot  Gait: Unassisted, Nonantalgic.     Radiographs:  Date: 11/21/2022 XR the left foot Weightbearing AP/Lateral/Oblique   Findings: no soft tissue emphysema, no signs of osteomyelitis Assessment:   1. Skin ulcer of toe of left foot with fat layer exposed   2. Cellulitis and abscess of toe of left foot   3. Hammer toes of both feet   4. Callus   5. Neuropathy   6. Coagulation defect      Plan:  Patient was evaluated and treated and all questions answered.  # Ulceration of the left second toe to the level of subcutaneous tissue with cellulitis of the toe   -We discussed the etiology and factors that are a part of the wound healing process.  We also discussed the risk of infection both soft tissue and osteomyelitis from open ulceration.  Discussed the risk of limb loss if this happens or worsens. -Debridement as below. -  Dressed with betadine, DSD. -Continue home dressing changes daily with betadine and gauze -Continue off-loading with spacer between 1st and 2nd toe -Vascular testing deferred, pt does have palapable pulses -Last antibiotics: eRx for doxycycline 100 mg BID x 10 days -Imaging: x-ray reviewed, shows no signs of erosions, osteolysis, osteomyelitis or emphysema.  Procedure: Excisional Debridement of Wound Rationale: Removal of non-viable soft tissue from the wound to promote healing.  Anesthesia: none Post-Debridement Wound Measurements: 1 cm x 0.8 cm x 0.3 cm  Type of Debridement: Sharp Excisional Tissue Removed: Non-viable soft tissue Depth of Debridement: subcutaneous tissue. Technique: Sharp excisional debridement to bleeding,  viable wound base.  Dressing: Dry, sterile, compression dressing. Disposition: Patient tolerated procedure well.   Return in about 2 weeks (around 12/05/2022) for f/u L 2nd toe ulceration.          Corinna Gab, DPM [facility] / [facility]

## 2022-11-22 ENCOUNTER — Encounter: Payer: Self-pay | Admitting: Podiatry

## 2022-11-25 ENCOUNTER — Ambulatory Visit (HOSPITAL_COMMUNITY): Payer: Medicare Other | Attending: Internal Medicine

## 2022-11-25 DIAGNOSIS — R55 Syncope and collapse: Secondary | ICD-10-CM | POA: Insufficient documentation

## 2022-11-25 DIAGNOSIS — I951 Orthostatic hypotension: Secondary | ICD-10-CM

## 2022-11-25 DIAGNOSIS — I429 Cardiomyopathy, unspecified: Secondary | ICD-10-CM | POA: Insufficient documentation

## 2022-11-25 MED ORDER — TECHNETIUM TC 99M PYROPHOSPHATE
21.6000 | Freq: Once | INTRAVENOUS | Status: AC
Start: 2022-11-25 — End: 2022-11-25
  Administered 2022-11-25: 21.6 via INTRAVENOUS

## 2022-11-26 ENCOUNTER — Telehealth: Payer: Self-pay | Admitting: Podiatry

## 2022-11-26 LAB — MYOCARDIAL AMYLOID PLANAR & SPECT: H/CL Ratio: 1.15

## 2022-11-26 NOTE — Telephone Encounter (Signed)
Per Dr Annamary Rummage pt needs to be seen this Thursday for toe infection.  I left message for pt to call to schedule an appt.

## 2022-11-26 NOTE — Telephone Encounter (Signed)
Left message on home and cell for pt to call to schedule an appt.

## 2022-11-28 ENCOUNTER — Ambulatory Visit: Payer: Medicare Other | Admitting: Podiatry

## 2022-11-28 DIAGNOSIS — L97522 Non-pressure chronic ulcer of other part of left foot with fat layer exposed: Secondary | ICD-10-CM | POA: Diagnosis not present

## 2022-11-28 DIAGNOSIS — L03032 Cellulitis of left toe: Secondary | ICD-10-CM

## 2022-11-28 DIAGNOSIS — L02612 Cutaneous abscess of left foot: Secondary | ICD-10-CM

## 2022-11-28 MED ORDER — DOXYCYCLINE HYCLATE 100 MG PO TABS: 100.0000 mg | ORAL_TABLET | Freq: Two times a day (BID) | ORAL | 0 refills | Status: AC

## 2022-11-28 NOTE — Progress Notes (Signed)
Subjective:  Patient ID: Tony Cruz, male    DOB: February 10, 1934,  MRN: [id number]  Chief Complaint  Patient presents with   Nail Problem    Nail trim. Patient states that he is having a little pain.    89 y.o. male presents for follow-up ulceration of the left second toe.  Overall the area is healing with wound care including Betadine and gauze applied to the toe daily and change daily.  He has decreased redness and swelling not having much pain.  He has been taking doxycycline twice daily for the last 8 days. Past Medical History:  Diagnosis Date   Allergy    Anal fissure    Anemia    Arthritis    diffuse arthritis   Backache, unspecified    Barrett's esophagus    BPH (benign prostatic hyperplasia)    Diverticulosis of colon (without mention of hemorrhage)    DVT of leg (deep venous thrombosis) (HCC)    Esophageal reflux    Factor V Leiden deficiency (HCC)    GERD (gastroesophageal reflux disease)    hypertension    Hypertrophy of prostate with urinary obstruction and other lower urinary tract symptoms (LUTS)    Irritable bowel syndrome    Obstructive sleep apnea    Osteoarthrosis, unspecified whether generalized or localized, unspecified site    Other abnormal glucose    Overweight(278.02)    Pacemaker-MDT    Change out 2008   Sleep apnea 2016   Uses cpap   Syncope and collapse    Tubular adenoma of colon    Unspecified hereditary and idiopathic peripheral neuropathy     Allergies  Allergen Reactions   Shellfish Allergy Rash and Other (See Comments)    Sensitive to shellfish    ROS: Negative except as per HPI above  Objective:  General: AAO x3, NAD  Dermatological: Ulceration present at the medial aspect of the left second toe ulceration with decreased erythema and depth, decreased maceration.  Probes down to near capsule or bone.  See clinical picture below.   Vascular:  Dorsalis Pedis artery and Posterior Tibial artery pedal pulses are 1/4 bilateral.   Capillary fill time < 3 sec to all digits.   Neruologic: Grossly diminished/absent to light touch of the toes of the left foot  Musculoskeletal: Hallux valgus and hand toe deformity noted to the left foot  Gait: Unassisted, Nonantalgic.    Radiographs:  Date: 11/21/2022 XR the left foot Weightbearing AP/Lateral/Oblique   Findings: no soft tissue emphysema, no signs of osteomyelitis Assessment:   1. Skin ulcer of toe of left foot with fat layer exposed (HCC)   2. Cellulitis and abscess of toe of left foot       Plan:  Patient was evaluated and treated and all questions answered.  # Ulceration of the left second toe to the level of subcutaneous tissue with cellulitis of the toe -Improving with wound care and abx  -We discussed the etiology and factors that are a part of the wound healing process.  We also discussed the risk of infection both soft tissue and osteomyelitis from open ulceration.  Discussed the risk of limb loss if this happens or worsens. -Debridement as below. -Dressed with betadine, DSD. -Continue home dressing changes daily with betadine and gauze -Continue off-loading with spacer between 1st and 2nd toe -Vascular testing deferred, pt does have palapable pulses -Last antibiotics: eRx for doxycycline 100 mg BID x 10 days, will send Rx for additional 7 days -Imaging: x-ray  reviewed, shows no signs of erosions, osteolysis, osteomyelitis or emphysema.  Procedure: Excisional Debridement of Wound Rationale: Removal of non-viable soft tissue from the wound to promote healing.  Anesthesia: none Post-Debridement Wound Measurements: 0.4 cm x 0.3 cm x 0.2 cm  Type of Debridement: Sharp Excisional Tissue Removed: Non-viable soft tissue Depth of Debridement: subcutaneous tissue. Technique: Sharp excisional debridement to bleeding, viable wound base.  Dressing: Dry, sterile, compression dressing. Disposition: Patient tolerated procedure well.   Return in about 3 weeks  (around 12/19/2022) for F/u L 2nd toe ulcer.          Corinna Gab, DPM [facility] / [facility]

## 2022-12-08 ENCOUNTER — Encounter: Payer: Self-pay | Admitting: Internal Medicine

## 2022-12-12 ENCOUNTER — Ambulatory Visit: Payer: Medicare Other | Admitting: Podiatry

## 2022-12-18 NOTE — Progress Notes (Signed)
Remote pacemaker transmission.   

## 2022-12-19 ENCOUNTER — Ambulatory Visit: Payer: Medicare Other | Admitting: Podiatry

## 2022-12-19 DIAGNOSIS — M2041 Other hammer toe(s) (acquired), right foot: Secondary | ICD-10-CM

## 2022-12-19 DIAGNOSIS — D689 Coagulation defect, unspecified: Secondary | ICD-10-CM

## 2022-12-19 DIAGNOSIS — L97522 Non-pressure chronic ulcer of other part of left foot with fat layer exposed: Secondary | ICD-10-CM | POA: Diagnosis not present

## 2022-12-19 DIAGNOSIS — G629 Polyneuropathy, unspecified: Secondary | ICD-10-CM

## 2022-12-19 DIAGNOSIS — M2042 Other hammer toe(s) (acquired), left foot: Secondary | ICD-10-CM

## 2022-12-19 NOTE — Progress Notes (Signed)
Subjective:  Patient ID: Tony Cruz, male    DOB: [birth year],  MRN: [id number]  Chief Complaint  Patient presents with   Wound Check    F/u L 2nd toe ulcer 3wks    89 y.o. male presents for follow-up ulceration of the left second toe.  Overall the area is healing with wound care including Betadine and gauze applied to the toe daily and change daily.  He has decreased redness and swelling not having much pain.  Patient has completed 2 courses of doxycycline for total of 20 days therapy.  Past Medical History:  Diagnosis Date   Allergy    Anal fissure    Anemia    Arthritis    diffuse arthritis   Backache, unspecified    Barrett's esophagus    BPH (benign prostatic hyperplasia)    Diverticulosis of colon (without mention of hemorrhage)    DVT of leg (deep venous thrombosis) (HCC)    Esophageal reflux    Factor V Leiden deficiency (HCC)    GERD (gastroesophageal reflux disease)    hypertension    Hypertrophy of prostate with urinary obstruction and other lower urinary tract symptoms (LUTS)    Irritable bowel syndrome    Obstructive sleep apnea    Osteoarthrosis, unspecified whether generalized or localized, unspecified site    Other abnormal glucose    Overweight(278.02)    Pacemaker-MDT    Change out 2008   Sleep apnea 2016   Uses cpap   Syncope and collapse    Tubular adenoma of colon    Unspecified hereditary and idiopathic peripheral neuropathy     Allergies  Allergen Reactions   Shellfish Allergy Rash and Other (See Comments)    Sensitive to shellfish    ROS: Negative except as per HPI above  Objective:  General: AAO x3, NAD  Dermatological: Ulceration present at the medial aspect of the left second toe ulceration with decreased erythema and depth, decreased maceration.  Decreased size of the ulceration from prior as well as depth is much decreased from prior Measures 0.3 x 0.3 x 0.1 cm postdebridement.   Vascular:  Dorsalis Pedis artery and  Posterior Tibial artery pedal pulses are 1/4 bilateral.  Capillary fill time < 3 sec to all digits.   Neruologic: Grossly diminished/absent to light touch of the toes of the left foot  Musculoskeletal: Hallux valgus and hand toe deformity noted to the left foot  Gait: Unassisted, Nonantalgic.    Radiographs:  Date: 11/21/2022 XR the left foot Weightbearing AP/Lateral/Oblique   Findings: no soft tissue emphysema, no signs of osteomyelitis Assessment:   1. Skin ulcer of toe of left foot with fat layer exposed (HCC)   2. Hammer toes of both feet   3. Neuropathy   4. Coagulation defect [name])        Plan:  Patient was evaluated and treated and all questions answered.  # Ulceration of the left second toe to the level of subcutaneous tissue -Improving with wound care and abx -Cellulitis appears resolved at this time -We discussed the etiology and factors that are a part of the wound healing process.  We also discussed the risk of infection both soft tissue and osteomyelitis from open ulceration.  Discussed the risk of limb loss if this happens or worsens. -Debridement as below. -Dressed with betadine, DSD. -Continue home dressing changes daily with betadine and gauze -Continue off-loading with spacer between 1st and 2nd toe -Vascular testing deferred, pt does have palapable pulses -Last antibiotics: No  further antibiotics indicated -Imaging: x-ray reviewed, shows no signs of erosions, osteolysis, osteomyelitis or emphysema.  Procedure: Excisional Debridement of Wound Rationale: Removal of non-viable soft tissue from the wound to promote healing.  Anesthesia: none Post-Debridement Wound Measurements: 0.3 cm x 0.3 cm x 0.1 cm  Type of Debridement: Sharp Excisional Tissue Removed: Non-viable soft tissue Depth of Debridement: subcutaneous tissue. Technique: Sharp excisional debridement to bleeding, viable wound base.  Dressing: Dry, sterile, compression dressing. Disposition:  Patient tolerated procedure well.   Return in about 3 weeks (around 01/09/2023) for wound care L 2nd toe.          Corinna Gab, DPM [facility] / [facility]

## 2023-01-06 ENCOUNTER — Telehealth: Payer: Self-pay

## 2023-01-07 ENCOUNTER — Encounter: Payer: Self-pay | Admitting: Family Medicine

## 2023-01-07 ENCOUNTER — Telehealth: Payer: Medicare Other | Admitting: Family Medicine

## 2023-01-07 DIAGNOSIS — G4733 Obstructive sleep apnea (adult) (pediatric): Secondary | ICD-10-CM

## 2023-01-07 NOTE — Progress Notes (Signed)
PATIENT: Tony Cruz DOB: 1933/08/04  REASON FOR VISIT: follow up HISTORY FROM: patient  Virtual Visit via Telephone Note  I connected with Tony Cruz on 01/07/23 at  2:00 PM EDT by telephone and verified that I am speaking with the correct person using two identifiers.   I discussed the limitations, risks, security and privacy concerns of performing an evaluation and management service by telephone and the availability of in person appointments. I also discussed with the patient that there may be a patient responsible charge related to this service. The patient expressed understanding and agreed to proceed.   History of Present Illness:  01/07/23 ALL: Tony Cruz is a 89 y.o. male here today for follow up for OSA on CPAP. He is doing well. He is using therapy nightly for about 7 hours on average. He does report sleeping differently in the bed due to his wife's recent injury and feels he may be sleeping on his back more. He is not sure he sleeps any better with therapy but recognizes the health benefits. He is ready for new supplies.      Observations/Objective:  Generalized: Well developed, in no acute distress  Mentation: Alert oriented to time, place, history taking. Follows all commands speech and language fluent   Assessment and Plan:  89 y.o. year old male  has a past medical history of Allergy, Anal fissure, Anemia, Arthritis, Backache, unspecified, Barrett's esophagus, BPH (benign prostatic hyperplasia), Diverticulosis of colon (without mention of hemorrhage), DVT of leg (deep venous thrombosis) (HCC), Esophageal reflux, Factor V Leiden deficiency (HCC), GERD (gastroesophageal reflux disease), hypertension, Hypertrophy of prostate with urinary obstruction and other lower urinary tract symptoms (LUTS), Irritable bowel syndrome, Obstructive sleep apnea, Osteoarthrosis, unspecified whether generalized or localized, unspecified site, Other abnormal  glucose, Overweight(278.02), Pacemaker-MDT, Sleep apnea (2016), Syncope and collapse, Tubular adenoma of colon, and Unspecified hereditary and idiopathic peripheral neuropathy. here with    ICD-10-CM   1. OSA on CPAP  G47.33 For home use only DME continuous positive airway pressure (CPAP)      He continues to do well on therapy. Compliance report reveals excellent compliance. AHI now 6.4/hr. I will recheck report in 4-6 weeks. May consider increasing max pressure to 12. He was encouraged to continue using CPAp nightly for at least 4 hours. He will continue healthy lifestyle habits and close follow up with his care team. He will follow up with me in 1 year.   Orders Placed This Encounter  Procedures   For home use only DME continuous positive airway pressure (CPAP)    Supplies    Order Specific Question:   Length of Need    Answer:   [employer]    Order Specific Question:   Patient has OSA or probable OSA    Answer:   Yes    Order Specific Question:   Is the patient currently using CPAP in the home    Answer:   Yes    Order Specific Question:   Settings    Answer:   Other see comments    Order Specific Question:   CPAP supplies needed    Answer:   Mask, headgear, cushions, filters, heated tubing and water chamber    No orders of the defined types were placed in this encounter.    Follow Up Instructions:  I discussed the assessment and treatment plan with the patient. The patient was provided an opportunity to ask questions and all were answered. The patient agreed with  the plan and demonstrated an understanding of the instructions.   The patient was advised to call back or seek an in-person evaluation if the symptoms worsen or if the condition fails to improve as anticipated.  I provided 15 minutes of non-face-to-face time during this encounter. Patient located at their place of residence during Mychart visit. Provider is in the office.    Shawnie Dapper, NP

## 2023-01-07 NOTE — Patient Instructions (Signed)

## 2023-01-09 ENCOUNTER — Ambulatory Visit: Payer: Medicare Other | Admitting: Podiatry

## 2023-01-09 DIAGNOSIS — M2042 Other hammer toe(s) (acquired), left foot: Secondary | ICD-10-CM

## 2023-01-09 DIAGNOSIS — G629 Polyneuropathy, unspecified: Secondary | ICD-10-CM

## 2023-01-09 DIAGNOSIS — M2041 Other hammer toe(s) (acquired), right foot: Secondary | ICD-10-CM

## 2023-01-09 DIAGNOSIS — L97522 Non-pressure chronic ulcer of other part of left foot with fat layer exposed: Secondary | ICD-10-CM | POA: Diagnosis not present

## 2023-01-09 MED ORDER — GENTAMICIN SULFATE 0.1 % EX OINT: 1.0000 | TOPICAL_OINTMENT | Freq: Every day | CUTANEOUS | 0 refills | Status: DC

## 2023-01-09 NOTE — Progress Notes (Signed)
Subjective:  Patient ID: Tony Cruz, male    DOB: 08-10-1933,  MRN: [id number]  Chief Complaint  Patient presents with   Nail Problem    RFC   Foot Ulcer    Follow up on ulcer    89 y.o. male presents for follow-up ulceration of the left second toe.  Overall the area is healing with wound care including Betadine and gauze applied to the toe daily and change daily.   States redness and swelling have decreased in the toe.   Past Medical History:  Diagnosis Date   Allergy    Anal fissure    Anemia    Arthritis    diffuse arthritis   Backache, unspecified    Barrett's esophagus    BPH (benign prostatic hyperplasia)    Diverticulosis of colon (without mention of hemorrhage)    DVT of leg (deep venous thrombosis) (HCC)    Esophageal reflux    Factor V Leiden deficiency (HCC)    GERD (gastroesophageal reflux disease)    hypertension    Hypertrophy of prostate with urinary obstruction and other lower urinary tract symptoms (LUTS)    Irritable bowel syndrome    Obstructive sleep apnea    Osteoarthrosis, unspecified whether generalized or localized, unspecified site    Other abnormal glucose    Overweight(278.02)    Pacemaker-MDT    Change out 2008   Sleep apnea 2016   Uses cpap   Syncope and collapse    Tubular adenoma of colon    Unspecified hereditary and idiopathic peripheral neuropathy     Allergies  Allergen Reactions   Shellfish Allergy Rash and Other (See Comments)    Sensitive to shellfish    ROS: Negative except as per HPI above  Objective:  General: AAO x3, NAD  Dermatological: Ulceration present at the medial aspect of the left second toe ulceration with decreased erythema and edema of the toe. Slightly large size of the ulceration from prior. Measures 0.8 x 0.4 x 0.2 cm postdebridement.   Vascular:  Dorsalis Pedis artery and Posterior Tibial artery pedal pulses are 1/4 bilateral.  Capillary fill time < 3 sec to all digits.   Neruologic:  Grossly diminished/absent to light touch of the toes of the left foot  Musculoskeletal: Hallux valgus and hand toe deformity noted to the left foot  Gait: Unassisted, Nonantalgic.    Radiographs:  Date: 11/21/2022 XR the left foot Weightbearing AP/Lateral/Oblique   Findings: no soft tissue emphysema, no signs of osteomyelitis Assessment:   1. Skin ulcer of toe of left foot with fat layer exposed (HCC)   2. Hammer toes of both feet   3. Neuropathy         Plan:  Patient was evaluated and treated and all questions answered.  # Ulceration of the left second toe to the level of subcutaneous tissue -Slightly worse at this visit as patient has not been spacing in between the first and second toe with a gel spacers -Cellulitis appears resolved at this time -We discussed the etiology and factors that are a part of the wound healing process.  We also discussed the risk of infection both soft tissue and osteomyelitis from open ulceration.  Discussed the risk of limb loss if this happens or worsens. -Debridement as below. -Dressed with antibiotic ointment, DSD. -Continue home dressing changes daily with gentamicin ointment which was sent to the patient's pharmacy [facility] and gauze -Continue off-loading with spacer between 1st and 2nd toe-emphasized the importance of a  gel spacer in between the first and second toe to prevent recurrence -Vascular testing deferred, pt does have palapable pulses -Last antibiotics: No further antibiotics indicated -Imaging: x-ray reviewed, shows no signs of erosions, osteolysis, osteomyelitis or emphysema.  Procedure: Excisional Debridement of Wound Rationale: Removal of non-viable soft tissue from the wound to promote healing.  Anesthesia: none Post-Debridement Wound Measurements: 0.8 cm x 0.4 cm x 0.2 cm  Type of Debridement: Sharp Excisional Tissue Removed: Non-viable soft tissue Depth of Debridement: subcutaneous tissue. Technique: Sharp excisional  debridement to bleeding, viable wound base.  Dressing: Dry, sterile, compression dressing. Disposition: Patient tolerated procedure well.   Return in about 3 weeks (around 01/30/2023) for f/u L 2nd toe ulcer.          Corinna Gab, DPM [facility] / [facility]

## 2023-01-15 ENCOUNTER — Encounter: Payer: Self-pay | Admitting: Podiatry

## 2023-01-16 ENCOUNTER — Other Ambulatory Visit (INDEPENDENT_AMBULATORY_CARE_PROVIDER_SITE_OTHER): Payer: Medicare Other | Admitting: Podiatry

## 2023-01-16 MED ORDER — MUPIROCIN 2 % EX OINT: 1.0000 | TOPICAL_OINTMENT | Freq: Two times a day (BID) | CUTANEOUS | 0 refills | Status: DC

## 2023-01-16 NOTE — Progress Notes (Signed)
Mupirocin ointment rx sent

## 2023-02-06 ENCOUNTER — Ambulatory Visit: Payer: Medicare Other | Admitting: Podiatry

## 2023-02-06 DIAGNOSIS — M79674 Pain in right toe(s): Secondary | ICD-10-CM | POA: Diagnosis not present

## 2023-02-06 DIAGNOSIS — B351 Tinea unguium: Secondary | ICD-10-CM | POA: Diagnosis not present

## 2023-02-06 DIAGNOSIS — S90425A Blister (nonthermal), left lesser toe(s), initial encounter: Secondary | ICD-10-CM | POA: Diagnosis not present

## 2023-02-06 DIAGNOSIS — M2042 Other hammer toe(s) (acquired), left foot: Secondary | ICD-10-CM

## 2023-02-06 DIAGNOSIS — M79675 Pain in left toe(s): Secondary | ICD-10-CM

## 2023-02-06 DIAGNOSIS — L97522 Non-pressure chronic ulcer of other part of left foot with fat layer exposed: Secondary | ICD-10-CM

## 2023-02-06 DIAGNOSIS — M2041 Other hammer toe(s) (acquired), right foot: Secondary | ICD-10-CM | POA: Diagnosis not present

## 2023-02-06 DIAGNOSIS — G629 Polyneuropathy, unspecified: Secondary | ICD-10-CM | POA: Diagnosis not present

## 2023-02-06 NOTE — Progress Notes (Signed)
Subjective:  Patient ID: Tony Cruz, male    DOB: [birth year],  MRN: [id number]  Chief Complaint  Patient presents with   Wound Check    Ulcer check to 2nd toe on left foot. Patient has been changing dressings daily and applying Gentamicin ointment to area.     Nail Problem    Nail trim     89 y.o. male presents for follow-up ulceration of the left second toe.  Overall the area is healing with wound care including Betadine and gauze applied to the toe daily and change daily.   He denies any drainage from the left second toe wound.  Says it is mostly at this time.  He does notice that the new blister formed on the left great toe and is not sure how that happened.  He also is having difficulty trimming his nails and would like them trimmed at this visit.  Past Medical History:  Diagnosis Date   Allergy    Anal fissure    Anemia    Arthritis    diffuse arthritis   Backache, unspecified    Barrett's esophagus    BPH (benign prostatic hyperplasia)    Diverticulosis of colon (without mention of hemorrhage)    DVT of leg (deep venous thrombosis) (HCC)    Esophageal reflux    Factor V Leiden deficiency (HCC)    GERD (gastroesophageal reflux disease)    hypertension    Hypertrophy of prostate with urinary obstruction and other lower urinary tract symptoms (LUTS)    Irritable bowel syndrome    Obstructive sleep apnea    Osteoarthrosis, unspecified whether generalized or localized, unspecified site    Other abnormal glucose    Overweight(278.02)    Pacemaker-MDT    Change out 2008   Sleep apnea 2016   Uses cpap   Syncope and collapse    Tubular adenoma of colon    Unspecified hereditary and idiopathic peripheral neuropathy     Allergies  Allergen Reactions   Shellfish Allergy Rash and Other (See Comments)    Sensitive to shellfish    ROS: Negative except as per HPI above  Objective:  General: AAO x3, NAD  Dermatological: Ulceration present at the medial aspect  of the left second toe is now fully healed.  There is superficial healthy epithelium present.  On the left toe there is noted to be great blister present with serous fluid there is underlying there is raw skin present.   Vascular:  Dorsalis Pedis artery and Posterior Tibial artery pedal pulses are 1/4 bilateral.  Capillary fill time < 3 sec to all digits.   Neruologic: Grossly diminished/absent to light touch of the toes of the left foot  Musculoskeletal: Hallux valgus and hand toe deformity noted to the left foot  Gait: Unassisted, Nonantalgic.    Radiographs:  Date: 11/21/2022 XR the left foot Weightbearing AP/Lateral/Oblique   Findings: no soft tissue emphysema, no signs of osteomyelitis Assessment:   1. Skin ulcer of toe of left foot with fat layer exposed (HCC)   2. Blister of toe, left, initial encounter   3. Pain due to onychomycosis of toenails of both feet   4. Hammer toes of both feet   5. Neuropathy          Plan:  Patient was evaluated and treated and all questions answered.  # Ulceration of the left second toe to the level of subcutaneous tissue -Ulceration of the left heel with use of spacer on the 21st and  second toe -No longer any need for debridement area  # Blister of left hallux -Discussed with patient he does have a blister present on his left great toe -I lanced the blister which had underlying serous tissue and no deep ulceration -Recommend he use the mupirocin or bacitracin ointment I previously sent in for this spot until it is fully healed continue to change dressing daily  #Onychomycosis with pain  -Nails palliatively debrided as below. -Educated on self-care  Procedure: Nail Debridement Rationale: Pain Type of Debridement: manual, sharp debridement. Instrumentation: Nail nipper, rotary burr. Number of Nails: 10  Return in about 9 weeks (around 04/10/2023) for F/u L hallux blister/RFC.          Corinna Gab, DPM [facility] / [facility]

## 2023-02-18 ENCOUNTER — Ambulatory Visit (INDEPENDENT_AMBULATORY_CARE_PROVIDER_SITE_OTHER): Payer: Medicare Other

## 2023-02-18 DIAGNOSIS — I495 Sick sinus syndrome: Secondary | ICD-10-CM | POA: Diagnosis not present

## 2023-02-20 LAB — CUP PACEART REMOTE DEVICE CHECK
Battery Voltage: 60
Date Time Interrogation Session: 20240723092242
Implantable Lead Connection Status: 753985
Implantable Lead Implant Date: 19990330
Implantable Lead Implant Date: 19990330
Implantable Lead Location: 753859
Implantable Lead Location: 753860
Implantable Lead Model: 5092
Implantable Pulse Generator Implant Date: 20181024
Pulse Gen Model: 407145
Pulse Gen Serial Number: 69203177

## 2023-02-26 ENCOUNTER — Telehealth: Payer: Self-pay | Admitting: Family Medicine

## 2023-02-26 ENCOUNTER — Encounter: Payer: Self-pay | Admitting: Family Medicine

## 2023-02-26 DIAGNOSIS — G4733 Obstructive sleep apnea (adult) (pediatric): Secondary | ICD-10-CM

## 2023-02-26 NOTE — Addendum Note (Signed)
Addended by: Shawnie Dapper L on: 02/26/2023 12:48 PM   Modules accepted: Orders

## 2023-02-26 NOTE — Telephone Encounter (Signed)
Can you guys please attach a copy of most recent 90 day CPAP report, please? TY!

## 2023-03-05 ENCOUNTER — Other Ambulatory Visit: Payer: Self-pay

## 2023-03-05 ENCOUNTER — Emergency Department (HOSPITAL_COMMUNITY)
Admission: EM | Admit: 2023-03-05 | Discharge: 2023-03-05 | Disposition: A | Discharge status: Home / Self Care | Payer: Medicare Other | Attending: Emergency Medicine | Admitting: Emergency Medicine

## 2023-03-05 ENCOUNTER — Encounter (HOSPITAL_COMMUNITY): Payer: Self-pay

## 2023-03-05 ENCOUNTER — Emergency Department (HOSPITAL_COMMUNITY): Payer: Medicare Other

## 2023-03-05 DIAGNOSIS — S0093XA Contusion of unspecified part of head, initial encounter: Secondary | ICD-10-CM | POA: Diagnosis not present

## 2023-03-05 DIAGNOSIS — Z950 Presence of cardiac pacemaker: Secondary | ICD-10-CM | POA: Insufficient documentation

## 2023-03-05 DIAGNOSIS — Z79899 Other long term (current) drug therapy: Secondary | ICD-10-CM | POA: Insufficient documentation

## 2023-03-05 DIAGNOSIS — Z7901 Long term (current) use of anticoagulants: Secondary | ICD-10-CM | POA: Diagnosis not present

## 2023-03-05 DIAGNOSIS — W01198A Fall on same level from slipping, tripping and stumbling with subsequent striking against other object, initial encounter: Secondary | ICD-10-CM | POA: Insufficient documentation

## 2023-03-05 DIAGNOSIS — I10 Essential (primary) hypertension: Secondary | ICD-10-CM | POA: Diagnosis not present

## 2023-03-05 DIAGNOSIS — S0990XA Unspecified injury of head, initial encounter: Secondary | ICD-10-CM | POA: Diagnosis present

## 2023-03-05 DIAGNOSIS — E871 Hypo-osmolality and hyponatremia: Secondary | ICD-10-CM | POA: Diagnosis not present

## 2023-03-05 DIAGNOSIS — W19XXXA Unspecified fall, initial encounter: Secondary | ICD-10-CM

## 2023-03-05 DIAGNOSIS — Y92019 Unspecified place in single-family (private) house as the place of occurrence of the external cause: Secondary | ICD-10-CM | POA: Diagnosis not present

## 2023-03-05 LAB — COMPREHENSIVE METABOLIC PANEL
ALT: 26 U/L (ref 0–44)
AST: 29 U/L (ref 15–41)
Albumin: 3.5 g/dL (ref 3.5–5.0)
Alkaline Phosphatase: 36 U/L — ABNORMAL LOW (ref 38–126)
Anion gap: 12 (ref 5–15)
BUN: 16 mg/dL (ref 8–23)
CO2: 23 mmol/L (ref 22–32)
Calcium: 8.6 mg/dL — ABNORMAL LOW (ref 8.9–10.3)
Chloride: 95 mmol/L — ABNORMAL LOW (ref 98–111)
Creatinine, Ser: 1.09 mg/dL (ref 0.61–1.24)
GFR, Estimated: >60 mL/min (ref >60)
Glucose, Bld: 126 mg/dL — ABNORMAL HIGH (ref 70–99)
Potassium: 3.9 mmol/L (ref 3.5–5.1)
Sodium: 130 mmol/L — ABNORMAL LOW (ref 135–145)
Total Bilirubin: 0.6 mg/dL (ref 0.3–1.2)
Total Protein: 6 g/dL — ABNORMAL LOW (ref 6.5–8.1)

## 2023-03-05 LAB — CBC
HCT: 39.9 % (ref 39.0–52.0)
Hemoglobin: 13.9 g/dL (ref 13.0–17.0)
MCH: 35.5 pg — ABNORMAL HIGH (ref 26.0–34.0)
MCHC: 34.8 g/dL (ref 30.0–36.0)
MCV: 101.8 fL — ABNORMAL HIGH (ref 80.0–100.0)
Platelets: 245 10*3/uL (ref 150–400)
RBC: 3.92 MIL/uL — ABNORMAL LOW (ref 4.22–5.81)
RDW: 12.2 % (ref 11.5–15.5)
WBC: 7.4 10*3/uL (ref 4.0–10.5)
nRBC: 0 % (ref 0.0–0.2)

## 2023-03-05 LAB — PROTIME-INR
INR: 1.4 — ABNORMAL HIGH (ref 0.8–1.2)
Prothrombin Time: 17.1 seconds — ABNORMAL HIGH (ref 11.4–15.2)

## 2023-03-05 LAB — ETHANOL: Alcohol, Ethyl (B): <10 mg/dL (ref <10)

## 2023-03-05 MED ORDER — ACETAMINOPHEN 500 MG PO TABS
1000.0000 mg | ORAL_TABLET | Freq: Once | ORAL | Status: AC
  Administered 2023-03-05: 1000 mg via ORAL
  Filled 2023-03-05: qty 2

## 2023-03-05 NOTE — Progress Notes (Signed)
Responded to consult to support pt. That fail on thinner.  Wife at bedside.  Patient said he was doing better since his wife prayed for him.  Chaplain provided ministry of presence , listening and emotional support.  Will follow as needed.  Venida Jarvis, [place], [facility], Pager [phone]

## 2023-03-05 NOTE — ED Provider Notes (Signed)
[place] [facility]Y DE[facility]MENT AT [facility] Provider Note   CSN: [id number] Arrival date & time: 03/05/23  7829     History  Chief Complaint  Patient presents with   Marletta Lor    Tony Cruz is a 89 y.o. male with PMH as listed below who presents by POV as level 2 trauma after fall. Was sitting on the bed, had a leg cramp, tried to jump up from the bed but fell instead. Hit left forehead on nightstand and left upper back/shoulder on the floor. Endorses "dull" 4/10 left sided headache, otherwise is not hurting anywhere else. Denies neck or back pain, chest pain, abd pain, hip/pelvic pain, LE pain. Otherwise was in his NSOH. Did not lose consciousness when he fell.  Takes Xarelto for factor V Leiden and history of DVT.   Past Medical History:  Diagnosis Date   Allergy    Anal fissure    Anemia    Arthritis    diffuse arthritis   Backache, unspecified    Barrett's esophagus    BPH (benign prostatic hyperplasia)    Diverticulosis of colon (without mention of hemorrhage)    DVT of leg (deep venous thrombosis) (HCC)    Esophageal reflux    Factor V Leiden deficiency (HCC)    GERD (gastroesophageal reflux disease)    hypertension    Hypertrophy of prostate with urinary obstruction and other lower urinary tract symptoms (LUTS)    Irritable bowel syndrome    Obstructive sleep apnea    Osteoarthrosis, unspecified whether generalized or localized, unspecified site    Other abnormal glucose    Overweight(278.02)    Pacemaker-MDT    Change out 2008   Sleep apnea 2016   Uses cpap   Syncope and collapse    Tubular adenoma of colon    Unspecified hereditary and idiopathic peripheral neuropathy        Home Medications Prior to Admission medications   Medication Sig Start Date End Date Taking? Authorizing Provider  acetaminophen (TYLENOL) 500 MG tablet Take 500-1,000 mg by mouth every 6 (six) hours as needed for headache (or pain).    Yes [provider]   amoxicillin (AMOXIL) 500 MG capsule Take 2,000 mg by mouth once.   Yes [provider]  ascorbic acid (VITAMIN C) 500 MG tablet Take 1 tablet by mouth daily.   Yes [provider]  calcium carbonate (OS-CAL) 600 MG TABS Take 600 mg by mouth daily.   Yes [provider]  chlorthalidone (HYGROTON) 25 MG tablet TAKE 1 TABLET BY MOUTH DAILY 11/12/22  Yes Duke Salvia, MD  ferrous sulfate 325 (65 FE) MG tablet Take 325 mg by mouth 2 (two) times daily with a meal.    Yes [provider]  finasteride (PROSCAR) 5 MG tablet Take 1 tablet by mouth daily. 08/13/11  Yes [provider]  gabapentin (NEURONTIN) 300 MG capsule Take 300 mg by mouth 2 (two) times daily. 08/29/20  Yes [provider]  loratadine (CLARITIN) 10 MG tablet Take 10 mg by mouth daily.   Yes [provider]  losartan (COZAAR) 100 MG tablet TAKE 1 TABLET BY MOUTH DAILY 11/21/22  Yes Duke Salvia, MD  Multiple Vitamin (MULTIVITAMIN) capsule Take 1 capsule by mouth daily.   Yes [provider]  naproxen (NAPROSYN) 500 MG tablet TAKE 1 TABLET TWICE A DAY 11/25/17  Yes Michele Mcalpine, MD  pantoprazole (PROTONIX) 40 MG tablet TAKE ONE TABLET DAILY. MAKEAN APPOINTMENT FOR  FURTHER REFILLS Patient taking differently: 20 mg daily. 05/01/18  Yes Michele Mcalpine, MD  tamsulosin (FLOMAX) 0.4 MG CAPS capsule Take 0.4 mg by mouth daily.   Yes [provider]  traMADol (ULTRAM) 50 MG tablet Take 50 mg by mouth 3 (three) times daily as needed.   Yes [provider]  XARELTO 15 MG TABS tablet TAKE 1 TABLET DAILY WITH   SUPPER 12/15/18  Yes Michele Mcalpine, MD  FLUZONE HIGH-DOSE QUADRIVALENT 0.7 ML SUSY  04/26/22   [provider]  gentamicin ointment (GARAMYCIN) 0.1 % Apply 1 Application topically daily. Patient not taking: Reported on 03/05/2023 01/09/23   Standiford, Jenelle Mages, DPM  mupirocin ointment (BACTROBAN) 2 % Apply 1 Application topically 2 (two) times  daily. Patient not taking: Reported on 03/05/2023 01/16/23   Pilar Plate, DPM      Allergies    Shellfish allergy    Review of Systems   Review of Systems A 10 point review of systems was performed and is negative unless otherwise reported in HPI.  Physical Exam Updated Vital Signs BP 135/77   Pulse 71   Temp 98.2 F (36.8 C) (Oral)   Resp 12   Ht 5\' 7"  (1.702 m)   Wt 97.5 kg   SpO2 98%   BMI 33.67 kg/m  Physical Exam  PRIMARY SURVEY  Airway Airway intact  Breathing Bilateral breath sounds  Circulation Carotid/femoral pulses 2+ intact bilaterally  GCS E =  4 V =  5 M =  6 Total = 15  Environment All clothes removed      SECONDARY SURVEY  Gen: -NAD  HEENT: -Head: NCAT. Scalp is clear of lacerations or wounds. Skull is clear of deformities or depressions -Forehead: 1x1 cm area of swelling above L eyebrow with mild TTP. -Midface: Stable -Eyes: No visible injury to eyelids or eye, PERRL, EOMI -Nose: No gross deformities -Mouth: No injuries to lips, tongue or teeth. No trismus or malposition -Ears: No auricular hematoma -Neck: Trachea is midline, no distended neck veins  Chest: -No tenderness, deformities, bruising or crepitus to clavicles or chest -Normal chest expansion -Normal heart sounds, S1/S2 normal, no m/r/g -No wheezes, rales, rhonchi  Abdomen: -No tenderness, bruising or penetrating injury  Pelvis: -Pelvis is stable and non-tender  Extremities: Right Upper Extremity: -No point tenderness, deformity or other signs of injury -Radial pulse intact RUE, cap refill good -Normal sensation -Normal ROM, good strength Left Upper Extremity: -No point tenderness, deformity or other signs of injury -Radial pulse intact LUE, cap refill good -Normal sensation -Normal ROM, good strength Right Lower Extremity: -No point tenderness, deformity or other signs of injury -DP intact RLE -Normal sensation -Normal ROM, good strength Left Lower Extremity: -No  point tenderness, deformity or other signs of injury -DP intact LLE -Normal sensation -Normal ROM, good strength  Back/Spine: -No midline C, T, or L spine tenderness or step-offs Ecchymosis noted to left upper back/scapular area  Other: N/A     ED Results / Procedures / Treatments   Labs (all labs ordered are listed, but only abnormal results are displayed) Labs Reviewed  COMPREHENSIVE METABOLIC PANEL - Abnormal; Notable for the following components:      Result Value   Sodium 130 (*)    Chloride 95 (*)    Glucose, Bld 126 (*)    Calcium 8.6 (*)    Total Protein 6.0 (*)    Alkaline Phosphatase 36 (*)    All other components within normal limits  CBC - Abnormal; Notable for the following components:   RBC 3.92 (*)    MCV 101.8 (*)    MCH 35.5 (*)    All other components within normal limits  PROTIME-INR - Abnormal; Notable for the following components:   Prothrombin Time 17.1 (*)    INR 1.4 (*)    All other components within normal limits  ETHANOL  URINALYSIS, ROUTINE W REFLEX MICROSCOPIC    EKG None  Radiology DG Chest Port 1 View  Result Date: 03/05/2023 CLINICAL DATA:  Pain after trauma. EXAM: PORTABLE CHEST 1 VIEW COMPARISON:  01/03/2015 FINDINGS: No consolidation, pneumothorax or effusion. No edema. Calcified aorta. Normal cardiopericardial silhouette. Left upper chest dual lead pacemaker. Overlapping cardiac leads. Degenerative changes of the spine. IMPRESSION: Pacemaker.  No acute cardiopulmonary disease. Electronically Signed   By: Karen Kays M.D.   On: 03/05/2023 11:29   CT CERVICAL SPINE WO CONTRAST  Result Date: 03/05/2023 CLINICAL DATA:  89 year old male status post fall forward at 7 a.m. striking forehead on furniture. On blood thinners. EXAM: CT CERVICAL SPINE WITHOUT CONTRAST TECHNIQUE: Multidetector CT imaging of the cervical spine was performed without intravenous contrast. Multiplanar CT image reconstructions were also generated. RADIATION DOSE  REDUCTION: This exam was performed according to the departmental dose-optimization program which includes automated exposure control, adjustment of the mA and/or kV according to patient size and/or use of iterative reconstruction technique. COMPARISON:  Head CT today.  Cervical spine radiographs 04/04/2014. FINDINGS: Alignment: Chronic straightening of cervical lordosis with subtle degenerative appearing anterolisthesis of C4 on C5 appears stable. Cervicothoracic junction alignment is within normal limits. Bilateral posterior element alignment is within normal limits. Skull base and vertebrae: Visualized skull base is intact. No atlanto-occipital dissociation. C1 and C2 appear chronically degenerated, but intact and aligned. Bulky subchondral cysts at the odontoid. No acute osseous abnormality identified. Additional degenerative findings described below. Soft tissues and spinal canal: No prevertebral fluid or swelling. No visible canal hematoma. Mild for age calcified cervical carotid atherosclerosis. Disc levels: Cervical spine degeneration with widespread facet arthropathy, and acquired/degenerative appearing ankylosis at C5-C6. Probable developing facet ankylosis at other levels (especially C3-C4 on the right. Bulky C5-C6 and C6-C7 disc and endplate degeneration but fairly capacious underlying cervical spinal canal. Upper chest: Left chest pacemaker type leads. Calcified aortic atherosclerosis. Upper lungs are clear. Grossly intact visible upper thoracic levels with bulky endplate spurring beginning at T3-T4. IMPRESSION: 1. No acute traumatic injury identified in the cervical spine. 2. Advanced chronic cervical spine degeneration superimposed on degenerative appearing C5-C6 ankylosis. Developing facet ankylosis suspected at other levels. 3.  Aortic Atherosclerosis (ICD10-I70.0). Electronically Signed   By: Odessa Fleming M.D.   On: 03/05/2023 10:05   CT HEAD WO CONTRAST  Result Date: 03/05/2023 CLINICAL DATA:   89 year old male status post fall forward at 7 a.m. striking forehead on furniture. On blood thinners. EXAM: CT HEAD WITHOUT CONTRAST TECHNIQUE: Contiguous axial images were obtained from the base of the skull through the vertex without intravenous contrast. RADIATION DOSE REDUCTION: This exam was performed according to the departmental dose-optimization program which includes automated exposure control, adjustment of the mA and/or kV according to patient size and/or use of iterative reconstruction technique. COMPARISON:  Head CT 10/06/2017. FINDINGS: Brain: Cerebral volume is within normal limits for age. No midline shift, ventriculomegaly, mass effect, evidence of mass lesion, intracranial hemorrhage or evidence of cortically based acute infarction. Stable small chronic area of left superior frontal gyrus gliosis/encephalomalacia (series 2, image 30), reportedly related to a  2015 traumatic hemorrhage. Patchy bilateral cerebral white matter hypodensity including some deep white matter capsule involvement in both hemispheres. This has mildly progressed since 2019. Chronic heterogeneity in the left lentiform is stable. Mild superimposed bilateral basal ganglia vascular calcifications. Vascular: Calcified atherosclerosis at the skull base. No suspicious intracranial vascular hyperdensity. Skull: No fracture identified. Sinuses/Orbits: Chronic ethmoid and sphenoid sinus mucosal thickening has not significantly changed. More involvement of the right frontoethmoidal recess now. No sinus fluid levels. Tympanic cavities and mastoids are clear. Other: Left forehead scalp hematoma or contusion series 3, image 51. No scalp soft tissue gas. Underlying left frontal bone and sinus appears stable and intact. Postoperative changes to both globes since 2019. IMPRESSION: 1. Left forehead scalp hematoma. No underlying skull fracture. 2. No acute intracranial abnormality. Mild progression of cerebral white matter disease since 2019.  Small area of chronic encephalomalacia in the left superior frontal gyrus. 3. Chronic paranasal sinus disease. Electronically Signed   By: Odessa Fleming M.D.   On: 03/05/2023 09:54    Procedures Procedures    Medications Ordered in ED Medications  acetaminophen (TYLENOL) tablet 1,000 mg (1,000 mg Oral Given 03/05/23 6387)    ED Course/ Medical Decision Making/ A&P                          Medical Decision Making Amount and/or Complexity of Data Reviewed Labs: ordered. Decision-making details documented in ED Course. Radiology: ordered. Decision-making details documented in ED Course.  Risk OTC drugs.    This patient presents to the ED for concern of fall on thinners, this involves an extensive number of treatment options, and is a complaint that carries with it a high risk of complications and morbidity.  I considered the following differential and admission for this acute, potentially life threatening condition. Pt overall very well-appearing, HDS, A&Ox4.  MDM:    DDX for trauma includes but is not limited to:  -Head Injury such as skull fx or ICH - fall on thinners, will get CTH -Spinal Cord or Vertebral injury - complains of no neck pain but advanced age and head trauma, wille val w/ CT c-spine -Fractures - will eval w/ CXR for rib fractures though no significant chet pain -pt otherwise in his [facility]  Clinical Course as of 03/05/23 1210  Wed Mar 05, 2023  1013 CT CERVICAL SPINE WO CONTRAST 1. No acute traumatic injury identified in the cervical spine. 2. Advanced chronic cervical spine degeneration superimposed on degenerative appearing C5-C6 ankylosis. Developing facet ankylosis suspected at other levels. 3.  Aortic Atherosclerosis (ICD10-I70.0).   [HN]  1013 CT HEAD WO CONTRAST 1. Left forehead scalp hematoma. No underlying skull fracture. 2. No acute intracranial abnormality. Mild progression of cerebral white matter disease since 2019. Small area of chronic  encephalomalacia in the left superior frontal gyrus. 3. Chronic paranasal sinus disease.   [HN]  1118 Have called radiology twice for CXR read performed at 0908 AM. Apparently they are having issues sending over imaging, causing a delay. [HN]  1120 Sodium(!): 130 Has had chronic mild hyponatremia in the past 131-134. Also as low as 124. He is asymptomatic from this perspective today.  [HN]  1208 DG Chest [facility] Pacemaker.  No acute cardiopulmonary disease. [HN]  1208 Pt reevaluated. States his headache is okay just a little sore. Lab w/u and imaging reassuring. DC'd w/ PCP f/u within 1 week. DC w/ discharge instructions/return precautions. All questions answered to patient's satisfaction.   [  HN]    Clinical Course User Index [HN] Loetta Rough, MD    Labs: I Ordered, and personally interpreted labs.  The pertinent results include:  those listed above  Imaging Studies ordered: I ordered imaging studies including CTH, CT c-spine, CXR I independently visualized and interpreted imaging. I agree with the radiologist interpretation  Additional history obtained from chart review, wife at bedside.    Cardiac Monitoring: The patient was maintained on a cardiac monitor.  I personally viewed and interpreted the cardiac monitored which showed an underlying rhythm of: NSR  Reevaluation: After the interventions noted above, I reevaluated the patient and found that they have :improved, doing sudoku puzzle in bed  Social Determinants of Health: Lives independently  Disposition:  DC w/ discharge instructions/return precautions. All questions answered to patient's satisfaction.    Co morbidities that complicate the patient evaluation  Past Medical History:  Diagnosis Date   Allergy    Anal fissure    Anemia    Arthritis    diffuse arthritis   Backache, unspecified    Barrett's esophagus    BPH (benign prostatic hyperplasia)    Diverticulosis of colon (without mention of  hemorrhage)    DVT of leg (deep venous thrombosis) (HCC)    Esophageal reflux    Factor V Leiden deficiency (HCC)    GERD (gastroesophageal reflux disease)    hypertension    Hypertrophy of prostate with urinary obstruction and other lower urinary tract symptoms (LUTS)    Irritable bowel syndrome    Obstructive sleep apnea    Osteoarthrosis, unspecified whether generalized or localized, unspecified site    Other abnormal glucose    Overweight(278.02)    Pacemaker-MDT    Change out 2008   Sleep apnea 2016   Uses cpap   Syncope and collapse    Tubular adenoma of colon    Unspecified hereditary and idiopathic peripheral neuropathy      Medicines Meds ordered this encounter  Medications   acetaminophen (TYLENOL) tablet 1,000 mg    I have reviewed the patients home medicines and have made adjustments as needed  Problem List / ED Course: Problem List Items Addressed This Visit   None Visit Diagnoses     Fall in home, initial encounter    -  Primary   Contusion of head, unspecified part of head, initial encounter                       This note was created using dictation software, which may contain spelling or grammatical errors.    Loetta Rough, MD 03/05/23 [phone]

## 2023-03-05 NOTE — ED Notes (Signed)
Trauma Response Nurse Documentation   Tony Cruz is a 89 y.o. male arriving to [facility] ED via POV  On Xarelto (rivaroxaban) daily. Trauma was activated as a Level 2 by ED Charge RN based on the following trauma criteria Elderly patients > 65 with head trauma on anti-coagulation (excluding ASA).  Patient cleared for CT by Dr. Jearld Fenton. Pt transported to CT with trauma response nurse present to monitor. RN remained with the patient throughout their absence from the department for clinical observation.   GCS 15.  History   Past Medical History:  Diagnosis Date   Allergy    Anal fissure    Anemia    Arthritis    diffuse arthritis   Backache, unspecified    Barrett's esophagus    BPH (benign prostatic hyperplasia)    Diverticulosis of colon (without mention of hemorrhage)    DVT of leg (deep venous thrombosis) (HCC)    Esophageal reflux    Factor V Leiden deficiency (HCC)    GERD (gastroesophageal reflux disease)    hypertension    Hypertrophy of prostate with urinary obstruction and other lower urinary tract symptoms (LUTS)    Irritable bowel syndrome    Obstructive sleep apnea    Osteoarthrosis, unspecified whether generalized or localized, unspecified site    Other abnormal glucose    Overweight(278.02)    Pacemaker-MDT    Change out 2008   Sleep apnea 2016   Uses cpap   Syncope and collapse    Tubular adenoma of colon    Unspecified hereditary and idiopathic peripheral neuropathy      Past Surgical History:  Procedure Laterality Date   APPENDECTOMY     CHOLECYSTECTOMY     PACEMAKER PLACEMENT     PPM GENERATOR CHANGEOUT N/A 05/21/2017   Procedure: PPM GENERATOR CHANGEOUT;  Surgeon: Duke Salvia, MD;  Location: [facility] INVASIVE CV LAB;  Service: Cardiovascular;  Laterality: N/A;   skin cancer removed  12/2012   removed from his face--Dr. Irene Limbo   TONSILLECTOMY     TOTAL KNEE ARTHROPLASTY Right 12/03/2019   Procedure: TOTAL KNEE ARTHROPLASTY;  Surgeon: Eugenia Mcalpine, MD;  Location: [facility] ORS;  Service: Orthopedics;  Laterality: Right;  adductor canal      Initial Focused Assessment (If applicable, or please see trauma documentation): - Airway intact - Breath sounds equal, clear bilaterally - Small abrasion to L forehead - Bruising to L back  - c/o 4 out of 10 HA - VS WDL  CT's Completed:   CT Head and CT C-Spine   Interventions:  - 20G PIV to L AC - Labs drawn - CXR - CT head and C-spine  Plan for disposition:  Awaiting scan results   Consults completed:  none at 1000.  Event Summary: Pt came in POV after falling around 7 this morning.  He states he had a leg cramp which caused him to fall forward, striking his left head on the dresser.  He somehow scraped his back on the left side as well.  No LOC.  VS WDL. Pt did state he felt a little clammy after the fall.   Bedside handoff with ED RN Nehemiah Settle.    Janora Norlander  Trauma Response RN  Please call TRN at [phone] for further assistance.

## 2023-03-05 NOTE — ED Notes (Signed)
Patient transported to CT 

## 2023-03-05 NOTE — ED Triage Notes (Signed)
Pt arrived by POV w cc of head pain s/p fall on thinners. Pt states around 7am he had a cramp in his leg causing him to fall forward and hit the left side of forehead on dresser. Pt is on xarelto and last dose was last night. Pt denies any LOC but wanted to be checked out since he is actively on blood thinner. VSS/NAD

## 2023-03-05 NOTE — ED Notes (Signed)
MD at bedside. 

## 2023-03-05 NOTE — Progress Notes (Signed)
Orthopedic Tech Progress Note Patient Details:  Tony Cruz [birth year] [id number]  Level 2 trauma   Patient ID: Tony Cruz, male   DOB: [birth year], 89 y.o.   MRN: [id number]  Tony Cruz 03/05/2023, 9:09 AM

## 2023-03-05 NOTE — Discharge Instructions (Signed)
Thank you for coming to [facility] Emergency Department. You were seen for fall, head injury. We did an exam, labs, and imaging, and these showed a small hematoma to your forehead and chronic degenerative changes in your cervical spine without any acute fracture/injury.  You can take tylenol 1,000 mg every 8 hours for headache or pain.   Please follow up with your primary care provider within 1 week.   Do not hesitate to return to the ED or call 911 if you experience: -Worsening symptoms -Lightheadedness, passing out -Fevers/chills -Anything else that concerns you

## 2023-03-05 NOTE — ED Notes (Signed)
Pt received dc papers and verabalized understanding of f/u care. Pt ambulated out of ED w steady gait, a&ox4,vss,nad.

## 2023-03-05 NOTE — ED Notes (Addendum)
Trauma RN at bedside and paged out level 2 trauma for fall on thinners.

## 2023-03-05 NOTE — ED Notes (Signed)
Pt returned from CT °

## 2023-03-06 ENCOUNTER — Encounter: Payer: Self-pay | Admitting: Internal Medicine

## 2023-03-06 NOTE — Progress Notes (Signed)
Remote pacemaker transmission.   

## 2023-03-14 NOTE — Telephone Encounter (Signed)
Please Inform Patient That calcium on the aorta is almost certainly associated with atherosclerosis, as such we should continue the Rivaroxaban but also add, if he is willing a statin, either atorva 40 or rusova 20 which ever is the cheaper As to 100 that is beyond my pay grade, He will have to talk to my boss :)  Thanks

## 2023-04-07 ENCOUNTER — Telehealth: Payer: Self-pay | Admitting: Family Medicine

## 2023-04-07 DIAGNOSIS — G4733 Obstructive sleep apnea (adult) (pediatric): Secondary | ICD-10-CM

## 2023-04-07 NOTE — Telephone Encounter (Signed)
-----   Message from Brynja Marker sent at 02/26/2023 12:53 PM EDT ----- Regarding: recheck AHI Recheck afi after increasing pressure from 11 to 12

## 2023-04-07 NOTE — Telephone Encounter (Signed)
Will you please attach 90 day compliance report to review. TY!

## 2023-04-08 NOTE — Addendum Note (Signed)
Addended by: Shawnie Dapper L on: 04/08/2023 04:29 PM   Modules accepted: Orders

## 2023-04-08 NOTE — Telephone Encounter (Signed)
AHI remains around 6.2. I will increase max pressure from 12 to 13.

## 2023-04-17 ENCOUNTER — Telehealth: Payer: Self-pay | Admitting: *Deleted

## 2023-04-17 ENCOUNTER — Ambulatory Visit: Payer: Medicare Other | Admitting: Podiatry

## 2023-04-17 DIAGNOSIS — D689 Coagulation defect, unspecified: Secondary | ICD-10-CM | POA: Diagnosis not present

## 2023-04-17 DIAGNOSIS — B351 Tinea unguium: Secondary | ICD-10-CM | POA: Diagnosis not present

## 2023-04-17 DIAGNOSIS — S90425A Blister (nonthermal), left lesser toe(s), initial encounter: Secondary | ICD-10-CM | POA: Diagnosis not present

## 2023-04-17 DIAGNOSIS — M79675 Pain in left toe(s): Secondary | ICD-10-CM

## 2023-04-17 DIAGNOSIS — L97522 Non-pressure chronic ulcer of other part of left foot with fat layer exposed: Secondary | ICD-10-CM | POA: Diagnosis not present

## 2023-04-17 DIAGNOSIS — M79674 Pain in right toe(s): Secondary | ICD-10-CM | POA: Diagnosis not present

## 2023-04-17 DIAGNOSIS — G629 Polyneuropathy, unspecified: Secondary | ICD-10-CM | POA: Diagnosis not present

## 2023-04-17 NOTE — Telephone Encounter (Signed)
Left message for patient to discuss the below and needing a new DME company due to insurance.

## 2023-04-17 NOTE — Progress Notes (Signed)
Subjective:  Patient ID: Tony Cruz, male    DOB: 10-May-1934,  MRN: [id number]  Chief Complaint  Patient presents with   Nail Problem    Routine Foot Care-nail trim    Wound Check    Skin ulcer of left 2nd toe. It is healed.     89 y.o. male presents for follow-up ulceration of the left second toe and blister on left hallux. Notes the wounds are healed.  She is having some pain with nails.  They are thickening growing abnormally concern for fungal infection  Past Medical History:  Diagnosis Date   Allergy    Anal fissure    Anemia    Arthritis    diffuse arthritis   Backache, unspecified    Barrett's esophagus    BPH (benign prostatic hyperplasia)    Diverticulosis of colon (without mention of hemorrhage)    DVT of leg (deep venous thrombosis) (HCC)    Esophageal reflux    Factor V Leiden deficiency (HCC)    GERD (gastroesophageal reflux disease)    hypertension    Hypertrophy of prostate with urinary obstruction and other lower urinary tract symptoms (LUTS)    Irritable bowel syndrome    Obstructive sleep apnea    Osteoarthrosis, unspecified whether generalized or localized, unspecified site    Other abnormal glucose    Overweight(278.02)    Pacemaker-MDT    Change out 2008   Sleep apnea 2016   Uses cpap   Syncope and collapse    Tubular adenoma of colon    Unspecified hereditary and idiopathic peripheral neuropathy     Allergies  Allergen Reactions   Shellfish Allergy Rash and Other (See Comments)    Sensitive to shellfish    ROS: Negative except as per HPI above  Objective:  General: AAO x3, NAD  Dermatological: Ulceration present at the medial aspect of the left second toe is now fully healed.  There is superficial healthy epithelium present.  On the left toe there is no evidence of blister or wound improved from prior. Nails thickened elongated and dsytrophyic causing pain on palpation bilateral foot x 10 total.   Vascular:  Dorsalis Pedis  artery and Posterior Tibial artery pedal pulses are 1/4 bilateral.  Capillary fill time < 3 sec to all digits.   Neruologic: Grossly diminished/absent to light touch of the toes of the left foot  Musculoskeletal: Hallux valgus and hand toe deformity noted to the left foot  Gait: Unassisted, Nonantalgic.    Radiographs:  Date: 11/21/2022 XR the left foot Weightbearing AP/Lateral/Oblique   Findings: no soft tissue emphysema, no signs of osteomyelitis Assessment:   1. Skin ulcer of toe of left foot with fat layer exposed (HCC)   2. Pain due to onychomycosis of toenails of both feet   3. Blister of toe, left, initial encounter   4. Neuropathy   5. Coagulation defect [facility])       Plan:  Patient was evaluated and treated and all questions answered.  # Ulceration of the left second toe to the level of subcutaneous tissue -Ulceration of the left 2nd toe is fully healed -use gel cap or spacer to prevent recurrence -I certify that this diagnosis represents a distinct and separate diagnosis that requires evaluation and treatment separate from other procedures or diagnosis   # Blister of left hallux -Fully resolved at this time, monitor for recurrence prevent pressure between toes -I certify that this diagnosis represents a distinct and separate diagnosis that requires evaluation and  treatment separate from other procedures or diagnosis  #Onychomycosis with pain  -Nails palliatively debrided as below. -Educated on self-care  Procedure: Nail Debridement Rationale: Pain Type of Debridement: manual, sharp debridement. Instrumentation: Nail nipper, rotary burr. Number of Nails: 10  Return in about 3 months (around 07/17/2023) for RFC.          Corinna Gab, DPM [facility] / [facility]

## 2023-04-21 NOTE — Telephone Encounter (Signed)
Patient called back and left a voicemail on our phone stating he received a call from [place] and was returning that to discuss billing issues.

## 2023-04-21 NOTE — Telephone Encounter (Signed)
Called patient and reports he is now using [employer] [phone] he states he has his supplies already.   Update made on SnapShot in chart.

## 2023-05-20 ENCOUNTER — Ambulatory Visit (INDEPENDENT_AMBULATORY_CARE_PROVIDER_SITE_OTHER): Payer: Medicare Other

## 2023-05-20 DIAGNOSIS — I495 Sick sinus syndrome: Secondary | ICD-10-CM | POA: Diagnosis not present

## 2023-05-22 LAB — CUP PACEART REMOTE DEVICE CHECK
Battery Remaining Percentage: 55 %
Brady Statistic RA Percent Paced: 29 %
Brady Statistic RV Percent Paced: 0 %
Date Time Interrogation Session: 20241022071529
Implantable Lead Connection Status: 753985
Implantable Lead Connection Status: 753985
Implantable Lead Implant Date: 19990330
Implantable Lead Implant Date: 19990330
Implantable Lead Location: 753859
Implantable Lead Location: 753860
Implantable Lead Model: 5068
Implantable Lead Model: 5092
Implantable Pulse Generator Implant Date: 20181024
Lead Channel Impedance Value: 351 Ohm
Lead Channel Impedance Value: 507 Ohm
Lead Channel Pacing Threshold Amplitude: 1 V
Lead Channel Pacing Threshold Amplitude: 1 V
Lead Channel Pacing Threshold Pulse Width: 0.4 ms
Lead Channel Pacing Threshold Pulse Width: 0.4 ms
Lead Channel Sensing Intrinsic Amplitude: 0.9 mV
Lead Channel Sensing Intrinsic Amplitude: 2.8 mV
Lead Channel Setting Pacing Amplitude: 2 V
Lead Channel Setting Pacing Amplitude: 2.4 V
Lead Channel Setting Pacing Pulse Width: 0.4 ms
Lead Channel Setting Sensing Sensitivity: 1.5 mV
Pulse Gen Model: 407145
Pulse Gen Serial Number: 69203177

## 2023-06-09 NOTE — Progress Notes (Signed)
Remote pacemaker transmission.   

## 2023-07-15 ENCOUNTER — Ambulatory Visit: Payer: Medicare Other | Admitting: Podiatry

## 2023-07-15 ENCOUNTER — Encounter: Payer: Self-pay | Admitting: Podiatry

## 2023-07-15 DIAGNOSIS — G629 Polyneuropathy, unspecified: Secondary | ICD-10-CM | POA: Diagnosis not present

## 2023-07-15 DIAGNOSIS — M79675 Pain in left toe(s): Secondary | ICD-10-CM

## 2023-07-15 DIAGNOSIS — B351 Tinea unguium: Secondary | ICD-10-CM | POA: Diagnosis not present

## 2023-07-15 DIAGNOSIS — D689 Coagulation defect, unspecified: Secondary | ICD-10-CM | POA: Diagnosis not present

## 2023-07-15 DIAGNOSIS — M79674 Pain in right toe(s): Secondary | ICD-10-CM

## 2023-07-17 ENCOUNTER — Ambulatory Visit: Payer: Medicare Other | Admitting: Podiatry

## 2023-07-20 NOTE — Progress Notes (Signed)
  Subjective:  Patient ID: Tony Cruz, male    DOB: 1934-06-13,  MRN: [id number]  89 y.o. male presents with at risk foot care with history of coagulation defect and peripheral neuropathy. He is seen for painful elongated mycotic toenails 1-5 bilaterally which are tender when wearing enclosed shoe gear. Pain is relieved with periodic professional debridement. Chief Complaint  Patient presents with   Nail Problem    Patient states the last time he saw his pcp was in March      PCP: Alysia Penna, MD.  New problem(s): None.   Review of Systems: Negative except as noted in the HPI.   Allergies  Allergen Reactions   Shellfish Allergy Rash and Other (See Comments)    Sensitive to shellfish    Objective:  There were no vitals filed for this visit. Constitutional Patient is a pleasant 89 y.o. male in NAD. AAO x 3.  Vascular Capillary fill time to digits <3 seconds.  DP/PT pulse(s) are faintly palpable b/l lower extremities. Pedal hair absent b/l. Lower extremity skin temperature gradient warm to cool b/l. No pain with calf compression b/l. No cyanosis or clubbing noted. No ischemia nor gangrene noted b/l.   Neurologic Protective sensation diminished with 10g monofilament b/l.  Dermatologic Pedal skin is thin, shiny and atrophic b/l.  No open wounds b/l lower extremities. No interdigital macerations b/l lower extremities. Toenails 1-5 b/l elongated, discolored, dystrophic, thickened, crumbly with subungual debris and tenderness to dorsal palpation. No hyperkeratotic nor porokeratotic lesions present on today's visit.  Orthopedic: Normal muscle strength 5/5 to all lower extremity muscle groups bilaterally. HAV with bunion deformity noted b/l LE. Hammertoe deformity noted 2-5 b/l.   Last HgA1c:      No data to display         Assessment:   1. Pain due to onychomycosis of toenails of both feet   2. Coagulation defect (HCC)   3. Neuropathy    Plan:  Patient was evaluated  and treated and all questions answered. -Consent given for treatment as described below: -Examined patient. -Continue foot and shoe inspections daily. Monitor blood glucose per PCP/Endocrinologist's recommendations. -Toenails 1-5 b/l were debrided in length and girth with sterile nail nippers and dremel without iatrogenic bleeding.  -Patient/POA to call should there be question/concern in the interim.  Return in about 3 months (around 10/13/2023).  Freddie Breech, DPM      [place] LOCATION: [address] [place] [address]                   Office [phone]   [facility] LOCATION: [address] [place], [place] [address] Office [phone]

## 2023-08-04 ENCOUNTER — Ambulatory Visit: Payer: Medicare Other | Admitting: Neurology

## 2023-08-04 ENCOUNTER — Encounter: Payer: Self-pay | Admitting: Neurology

## 2023-08-04 VITALS — BP 122/71 | HR 91 | Ht 67.0 in | Wt 220.0 lb

## 2023-08-04 DIAGNOSIS — G609 Hereditary and idiopathic neuropathy, unspecified: Secondary | ICD-10-CM | POA: Diagnosis not present

## 2023-08-04 NOTE — Progress Notes (Signed)
 Follow-up Visit   Date: 08/04/2023    Tony Cruz MRN: [id number] DOB: 06-28-1934    Tony Cruz is a 89 y.o. right-handed Caucasian male with GERD, BPH, OSA, HTN, factor V Leiden, history of SHD, and syncope due to sinus node dysfunction s/p PPM returning to the clinic for follow-up of neuropathy.  The patient was accompanied to the clinic by self.  IMPRESSION/PLAN: Peripheral neuropathy manifesting with predominately numbness involving the feet, lower legs, and now fingertips.  He has mild intermittent sharp pain involving the hands.  He is most bothered by numbness in the hands and I explained that symptoms are most likely progression of neuropathy, however, to evaluate for overlapping cervical radiculopathy/entrapment neuropathy, NCS/EMG can be performed.  He agrees that symptoms are most likely neuropathy and does not want to proceed with testing.  From a management standpoint, there is no effective medication or treatment for numbness.  He takes gabapentin  300mg  twice daily which seems to help.  For his imbalance, I recommend physical therapy for balance training.  He will think about this and call my office, he if chooses to move forward with this.  In the meantime, I discussed fall precautions and encouraged him to use a cane, especially on uneven ground.   Return to clinic as needed  --------------------------------------------- History of present illness: Starting around 20 years ago, he began having numbness in the feet.  He saw neurologist who diagnosed him with idiopathic neuropathy.  Over the years, his neuropathy has gradually extended up his feet and into his lower legs.  Over the past year, he has developed numbness in the fingertips.  He has noticed that he is dropping things more frequently.  He takes gabapentin  300mg  twice daily but has not noticed that it helps.  He plays golf several times per week and notices some imbalance. No falls and he walks  unassisted.  He has a cane as needed.  No history of diabetes, alcohol , or exposure to chemotherapy.    UPDATE 08/04/2023:  He is here for follow-up.  He has noticed that numbness in the hands has progressed over the past year. Because of numbness, he tends to drop items.  He takes gabapentin  300mg  twice daily.  He continues to have numbness in the lower legs and numbness.  His balance is fair.  He walks unassisted and has not had any falls.  He plays golf twice per week and has noticed that he has some imbalance when swinging the golf club. He denies any significant pain of the hands and legs.   Medications:  Current Outpatient Medications on File Prior to Visit  Medication Sig Dispense Refill   acetaminophen  (TYLENOL ) 500 MG tablet Take 500-1,000 mg by mouth every 6 (six) hours as needed for headache (or pain).      ascorbic acid (VITAMIN C) 500 MG tablet Take 1 tablet by mouth daily.     calcium carbonate (OS-CAL) 600 MG TABS Take 600 mg by mouth daily.     chlorthalidone  (HYGROTON ) 25 MG tablet TAKE 1 TABLET BY MOUTH DAILY 90 tablet 3   ferrous sulfate  325 (65 FE) MG tablet Take 325 mg by mouth 2 (two) times daily with a meal.      finasteride (PROSCAR) 5 MG tablet Take 1 tablet by mouth daily.     FLUZONE HIGH-DOSE QUADRIVALENT 0.7 ML SUSY      gabapentin  (NEURONTIN ) 300 MG capsule Take 300 mg by mouth 2 (two) times daily.  gentamicin  ointment (GARAMYCIN ) 0.1 % Apply 1 Application topically daily. 15 g 0   loratadine (CLARITIN) 10 MG tablet Take 10 mg by mouth daily.     losartan  (COZAAR ) 100 MG tablet TAKE 1 TABLET BY MOUTH DAILY 90 tablet 3   Multiple Vitamin (MULTIVITAMIN) capsule Take 1 capsule by mouth daily.     mupirocin  ointment (BACTROBAN ) 2 % Apply 1 Application topically 2 (two) times daily. 22 g 0   naproxen  (NAPROSYN ) 500 MG tablet TAKE 1 TABLET TWICE A DAY 180 tablet 3   pantoprazole  (PROTONIX ) 40 MG tablet TAKE ONE TABLET DAILY. MAKEAN APPOINTMENT FOR FURTHER REFILLS  (Patient taking differently: 20 mg daily.) 90 tablet 3   tamsulosin (FLOMAX) 0.4 MG CAPS capsule Take 0.4 mg by mouth daily.     traMADol  (ULTRAM ) 50 MG tablet Take 50 mg by mouth 3 (three) times daily as needed.     XARELTO  15 MG TABS tablet TAKE 1 TABLET DAILY WITH   SUPPER 90 tablet 0   Current Facility-Administered Medications on File Prior to Visit  Medication Dose Route Frequency Provider Last Rate Last Admin   0.9 %  sodium chloride  infusion  500 mL Intravenous Continuous Pyrtle, Gordy HERO, MD        Allergies:  Allergies  Allergen Reactions   Shellfish Allergy Rash and Other (See Comments)    Sensitive to shellfish    Vital Signs:  BP 122/71   Pulse 91   Ht 5' 7 (1.702 m)   Wt 220 lb (99.8 kg)   SpO2 93%   BMI 34.46 kg/m    Neurological Exam: MENTAL STATUS including orientation to time, place, person, recent and remote memory, attention span and concentration, language, and fund of knowledge is normal.  Speech is not dysarthric.  CRANIAL NERVES:  Pupils equal round and reactive to light.  Normal conjugate, extra-ocular eye movements in all directions of gaze.  No ptosis.  Face is symmetric.   MOTOR:  Motor strength is 5/5 in all extremities, including distally.  No atrophy, fasciculations or abnormal movements.  No pronator drift.  Tone is normal.    MSRs:  Reflexes are 2+/4 in the arms, absent in the legs.  SENSORY:  Vibration intact at the MCP, absent below the knees.  Temperature intact in the hands, reduced below the knees and worse distally.  COORDINATION/GAIT:  Normal finger-to- nose-finger.  Intact rapid alternating movements bilaterally.  Gait is mildly wide based, unassisted.   Data: n/a    Thank you for allowing me to participate in patient's care.  If I can answer any additional questions, I would be pleased to do so.    Sincerely,    Tamisha Nordstrom K. Tobie, DO

## 2023-08-04 NOTE — Patient Instructions (Signed)
 Please call my office if you would like to do balance therapy

## 2023-08-19 ENCOUNTER — Ambulatory Visit (INDEPENDENT_AMBULATORY_CARE_PROVIDER_SITE_OTHER): Payer: Medicare HMO

## 2023-08-19 DIAGNOSIS — I495 Sick sinus syndrome: Secondary | ICD-10-CM | POA: Diagnosis not present

## 2023-08-19 LAB — CUP PACEART REMOTE DEVICE CHECK
Date Time Interrogation Session: 20250121080515
Implantable Lead Connection Status: 753985
Implantable Lead Connection Status: 753985
Implantable Lead Implant Date: 19990330
Implantable Lead Implant Date: 19990330
Implantable Lead Location: 753859
Implantable Lead Location: 753860
Implantable Lead Model: 5068
Implantable Lead Model: 5092
Implantable Pulse Generator Implant Date: 20181024
Pulse Gen Model: 407145
Pulse Gen Serial Number: 69203177

## 2023-08-25 ENCOUNTER — Encounter: Payer: Self-pay | Admitting: Internal Medicine

## 2023-08-25 DIAGNOSIS — R609 Edema, unspecified: Secondary | ICD-10-CM

## 2023-08-25 DIAGNOSIS — I10 Essential (primary) hypertension: Secondary | ICD-10-CM

## 2023-08-25 MED ORDER — CHLORTHALIDONE 25 MG PO TABS
25.0000 mg | ORAL_TABLET | Freq: Every day | ORAL | 0 refills | Status: DC
Start: 2023-08-25 — End: 2023-12-15

## 2023-08-25 MED ORDER — LOSARTAN POTASSIUM 100 MG PO TABS: 100.0000 mg | ORAL_TABLET | Freq: Every day | ORAL | 0 refills | Status: DC

## 2023-09-12 ENCOUNTER — Telehealth: Payer: Self-pay | Admitting: Internal Medicine

## 2023-09-12 NOTE — Telephone Encounter (Signed)
Left voicemail to return call to office

## 2023-09-12 NOTE — Telephone Encounter (Signed)
  Per Mychart Scheduling message:   Initial complaint: Some issues with unexpained pain in shoulder near pacemaker ( not daily) some swollen leg issues and a recent shortness of breath when active (goes away after brief stop   Pt c/o swelling/edema: STAT if pt has developed SOB within 24 hours  If swelling, where is the swelling located?   How much weight have you gained and in what time span?   Have you gained 2 pounds in a day or 5 pounds in a week?   Do you have a log of your daily weights (if so, list)?   Are you currently taking a fluid pill?   Are you currently SOB?   Have you traveled recently in a car or plane for an extended period of time?   1. my weight is pretty stable at 215 in spite of swelling in my legs above the ankle  2. I've not gained weight 3. I have kept nolog because my weight is relatively stable 4.  am not taking a fluid pill unless Tamsulosin counts as one  5. shartness of breath only occurs after vogorour work or exercise  6. our last trip to [place] was Christmas. I drove most of the 650 miles round-trip   [employer]

## 2023-09-18 ENCOUNTER — Encounter: Payer: Medicare Other | Admitting: Internal Medicine

## 2023-09-22 ENCOUNTER — Ambulatory Visit: Payer: Medicare Other | Attending: Internal Medicine | Admitting: Internal Medicine

## 2023-09-22 ENCOUNTER — Encounter: Payer: Self-pay | Admitting: Internal Medicine

## 2023-09-22 VITALS — BP 118/70 | HR 81 | Ht 67.0 in | Wt 226.6 lb

## 2023-09-22 DIAGNOSIS — Z950 Presence of cardiac pacemaker: Secondary | ICD-10-CM

## 2023-09-22 DIAGNOSIS — I10 Essential (primary) hypertension: Secondary | ICD-10-CM | POA: Diagnosis not present

## 2023-09-22 DIAGNOSIS — I495 Sick sinus syndrome: Secondary | ICD-10-CM | POA: Diagnosis not present

## 2023-09-22 DIAGNOSIS — R609 Edema, unspecified: Secondary | ICD-10-CM

## 2023-09-22 DIAGNOSIS — I4510 Unspecified right bundle-branch block: Secondary | ICD-10-CM | POA: Diagnosis not present

## 2023-09-22 DIAGNOSIS — I429 Cardiomyopathy, unspecified: Secondary | ICD-10-CM

## 2023-09-22 LAB — CUP PACEART INCLINIC DEVICE CHECK
Date Time Interrogation Session: 20250224123100
Implantable Lead Connection Status: 753985
Implantable Lead Connection Status: 753985
Implantable Lead Implant Date: 19990330
Implantable Lead Implant Date: 19990330
Implantable Lead Location: 753859
Implantable Lead Location: 753860
Implantable Lead Model: 5068
Implantable Lead Model: 5092
Implantable Pulse Generator Implant Date: 20181024
Pulse Gen Model: 407145
Pulse Gen Serial Number: 69203177

## 2023-09-22 MED ORDER — FUROSEMIDE 40 MG PO TABS: ORAL_TABLET | ORAL | 0 refills | Status: DC

## 2023-09-22 NOTE — Telephone Encounter (Signed)
 Pt was seen in the office today by Dr Graciela Husbands.  See OV note for complete details.

## 2023-09-22 NOTE — Progress Notes (Unsigned)
 Patient Care Team: Alysia Penna, MD as PCP - General (Internal Medicine) Duke Salvia, MD as PCP - Electrophysiology (Cardiology) Duke Salvia, MD as PCP - Cardiology (Cardiology) Glendale Chard, DO as Consulting Physician (Neurology)   HPI  Tony Cruz is a 89 y.o. male is seen in followup for syncope secondary to intermittent sinus node dysfunction (presumed neurally) for which a pacemaker-Biotronik was remotely implanted w/ change out 2008, 2018.   Complaining of dyspnea on exertion with peripheral edema and his weight up about 5 or 6 pounds.  No associated chest pain.  Diet is salt deplete but fluid replete.  Peripheral neuropathy now in his hands as well  DATE TEST EF   10/17 CTA  Ao 42 cm  5/22 Echo   60-65% %   4/24 PYP  Equivocal  result        Date Cr K Hgb  6/19 0.86 4.3 14.5  1/20  0.77 4.3 13.0  7/20 0.9 4.3 13.8  4/22 1.02 4.1 13.2  8/22  4.7 13.2  2/24 1.2 3.7 13.8  8/24 1.09 3.9 13.9   Xarelto for DVT and F V Leiden            Past Medical History:  Diagnosis Date   Allergy    Anal fissure    Anemia    Arthritis    diffuse arthritis   Backache, unspecified    Barrett's esophagus    BPH (benign prostatic hyperplasia)    Diverticulosis of colon (without mention of hemorrhage)    DVT of leg (deep venous thrombosis) (HCC)    Esophageal reflux    Factor V Leiden deficiency (HCC)    GERD (gastroesophageal reflux disease)    hypertension    Hypertrophy of prostate with urinary obstruction and other lower urinary tract symptoms (LUTS)    Irritable bowel syndrome    Obstructive sleep apnea    Osteoarthrosis, unspecified whether generalized or localized, unspecified site    Other abnormal glucose    Overweight(278.02)    Pacemaker-MDT    Change out 2008   Sleep apnea 2016   Uses cpap   Syncope and collapse    Tubular adenoma of colon    Unspecified hereditary and idiopathic peripheral neuropathy     Past Surgical  History:  Procedure Laterality Date   APPENDECTOMY     CHOLECYSTECTOMY     PACEMAKER PLACEMENT     PPM GENERATOR CHANGEOUT N/A 05/21/2017   Procedure: PPM GENERATOR CHANGEOUT;  Surgeon: Duke Salvia, MD;  Location: [facility] INVASIVE CV LAB;  Service: Cardiovascular;  Laterality: N/A;   skin cancer removed  12/2012   removed from his face--Dr. Irene Limbo   TONSILLECTOMY     TOTAL KNEE ARTHROPLASTY Right 12/03/2019   Procedure: TOTAL KNEE ARTHROPLASTY;  Surgeon: Eugenia Mcalpine, MD;  Location: [facility] ORS;  Service: Orthopedics;  Laterality: Right;  adductor canal    Current Outpatient Medications  Medication Sig Dispense Refill   acetaminophen (TYLENOL) 500 MG tablet Take 500-1,000 mg by mouth every 6 (six) hours as needed for headache (or pain).      ascorbic acid (VITAMIN C) 500 MG tablet Take 1 tablet by mouth daily.     calcium carbonate (OS-CAL) 600 MG TABS Take 600 mg by mouth daily.     chlorthalidone (HYGROTON) 25 MG tablet Take 1 tablet (25 mg total) by mouth daily. 90 tablet 0   ferrous sulfate 325 (65 FE) MG tablet Take 325 mg  by mouth 2 (two) times daily with a meal.      finasteride (PROSCAR) 5 MG tablet Take 1 tablet by mouth daily.     FLUZONE HIGH-DOSE QUADRIVALENT 0.7 ML SUSY      gabapentin (NEURONTIN) 300 MG capsule Take 300 mg by mouth 2 (two) times daily.     gentamicin ointment (GARAMYCIN) 0.1 % Apply 1 Application topically daily. 15 g 0   loratadine (CLARITIN) 10 MG tablet Take 10 mg by mouth daily.     losartan (COZAAR) 100 MG tablet Take 1 tablet (100 mg total) by mouth daily. 90 tablet 0   Multiple Vitamin (MULTIVITAMIN) capsule Take 1 capsule by mouth daily.     mupirocin ointment (BACTROBAN) 2 % Apply 1 Application topically 2 (two) times daily. 22 g 0   naproxen (NAPROSYN) 500 MG tablet TAKE 1 TABLET TWICE A DAY 180 tablet 3   pantoprazole (PROTONIX) 40 MG tablet TAKE ONE TABLET DAILY. MAKEAN APPOINTMENT FOR FURTHER REFILLS (Patient taking differently: 20 mg daily.) 90  tablet 3   tamsulosin (FLOMAX) 0.4 MG CAPS capsule Take 0.4 mg by mouth daily.     traMADol (ULTRAM) 50 MG tablet Take 50 mg by mouth 3 (three) times daily as needed.     XARELTO 15 MG TABS tablet TAKE 1 TABLET DAILY WITH   SUPPER 90 tablet 0   Current Facility-Administered Medications  Medication Dose Route Frequency Provider Last Rate Last Admin   0.9 %  sodium chloride infusion  500 mL Intravenous Continuous Pyrtle, Carie Caddy, MD        Allergies  Allergen Reactions   Shellfish Allergy Rash and Other (See Comments)    Sensitive to shellfish    Review of Systems negative except from HPI and PMH Please see the history of present illness. (+) Lightheadedness  (+)  All other systems are reviewed and negative.     Physical Exam BP 118/70   Pulse 81   Ht 5\' 7"  (1.702 m)   Wt 226 lb 9.6 oz (102.8 kg)   SpO2 94%   BMI 35.49 kg/m  Well developed and well nourished in no acute distress HENT normal Neck supple with JVP-8 cm  clear Device pocket well healed; without hematoma or erythema.  There is no tethering  Regular rate and rhythm, no  gallop No murmur Abd-soft with active BS No Clubbing cyanosis 2+ edema Skin-warm and dry A & Oriented  Grossly normal sensory and motor function  ECG atrial pacing at 81 Interval 21/12/40 Right bundle branch block left anterior fascicular block  Device function is normal.Programming changes none  See Paceart for details      07/31/2021 ECG: Atrial pacing at 87 Intervals 20/14/40 Right bundle branch block Right axis deviation 12/21 QRSd 134 ms and axis 34 1/21 QRSd 108 ms and axis -27  Assessment and plan  Syncope presumed neurally mediated  Sinus node dysfunction-prob neurally   Pacemaker Biotonik    Hypertension   Balance Neuropathy  DVT---- Factor V Leiden  Right bundle branch block-and posterior fascicular block-progressive  Peripheral neuropathy  Dyspnea on exertion probably HFpEF   Patient is volume overloaded.   Most consistent with HFpEF based on his prior testing.  We will hold his chlorthalidone and put him on furosemide 40 mg every other day x 5 doses.  If the 40 mg is inadequate we will have him take 80 mg.  After the 5 doses and his weight down about 5-6 pounds, we will have him resume  his chlorthalidone and use the furosemide as needed.  To confirm left ventricular function and next steps, we will undertake an echocardiogram.>>  If preserved LV function will begin SGLT2 otherwise other GDMT

## 2023-09-22 NOTE — Patient Instructions (Signed)
 Medication Instructions:  Your physician has recommended you make the following change in your medication:   ** Hold Chlorthalidone x 10 days  ** Begin Furosemide 40mg  - 1 tablet by mouth every other day x 5 doses.  If you are not urinating briskly on the 40mg  you may take 2 tablets (80mg ) every other day x 5 doses after which you will return to your normal dosing of Chorthalidone.   ** Target weight loss is 6 pounds  *If you need a refill on your cardiac medications before your next appointment, please call your pharmacy*   Lab Work: None ordered.  If you have labs (blood work) drawn today and your tests are completely normal, you will receive your results only by: MyChart Message (if you have MyChart) OR A paper copy in the mail If you have any lab test that is abnormal or we need to change your treatment, we will call you to review the results.   Testing/Procedures: Your physician has requested that you have an echocardiogram. Echocardiography is a painless test that uses sound waves to create images of your heart. It provides your doctor with information about the size and shape of your heart and how well your heart's chambers and valves are working. This procedure takes approximately one hour. There are no restrictions for this procedure. Please do NOT wear cologne, perfume, aftershave, or lotions (deodorant is allowed). Please arrive 15 minutes prior to your appointment time.  Please note: We ask at that you not bring children with you during ultrasound (echo/ vascular) testing. Due to room size and safety concerns, children are not allowed in the ultrasound rooms during exams. Our front office staff cannot provide observation of children in our lobby area while testing is being conducted. An adult accompanying a patient to their appointment will only be allowed in the ultrasound room at the discretion of the ultrasound technician under special circumstances. We apologize for any  inconvenience.    Follow-Up: At [facility], you and your health needs are our priority.  As part of our continuing mission to provide you with exceptional heart care, we have created designated Provider Care Teams.  These Care Teams include your primary Cardiologist (physician) and Advanced Practice Providers (APPs -  Physician Assistants and Nurse Practitioners) who all work together to provide you with the care you need, when you need it.  We recommend signing up for the patient portal called "MyChart".  Sign up information is provided on this After Visit Summary.  MyChart is used to connect with patients for Virtual Visits (Telemedicine).  Patients are able to view lab/test results, encounter notes, upcoming appointments, etc.  Non-urgent messages can be sent to your provider as well.   To learn more about what you can do with MyChart, go to [email or web address].    Your next appointment:   12 months

## 2023-09-24 ENCOUNTER — Encounter: Payer: Self-pay | Admitting: Internal Medicine

## 2023-10-01 NOTE — Progress Notes (Signed)
 Remote pacemaker transmission.

## 2023-10-10 ENCOUNTER — Encounter: Payer: Self-pay | Admitting: Podiatry

## 2023-10-10 ENCOUNTER — Ambulatory Visit: Admitting: Podiatry

## 2023-10-10 DIAGNOSIS — L97521 Non-pressure chronic ulcer of other part of left foot limited to breakdown of skin: Secondary | ICD-10-CM | POA: Diagnosis not present

## 2023-10-10 MED ORDER — DOXYCYCLINE HYCLATE 100 MG PO TABS: 100.0000 mg | ORAL_TABLET | Freq: Two times a day (BID) | ORAL | 0 refills | Status: DC

## 2023-10-10 NOTE — Progress Notes (Signed)
 Subjective:  Patient ID: Tony Cruz, male    DOB: 25-Apr-1934,  MRN: [id number]  Chief Complaint  Patient presents with   Nail Problem    Pt has a history of RA and his left great toe turns into left 2nd digit and caused an irritation and wound. He states it has been occurring for about two weeks. He also wants his nails trimmed has not been trimmed in 3 months. No signs of infection noted.    89 y.o. male presents with the above complaint.  Patient presents with left second digit superficial ulceration.  Patient states hallux is abutting against the toe leading to superficial ulceration pain on palpation.  He just notices soreness he wanted to get it evaluated he has been keeping it covered.  There is some redness that he noticed denies any other acute complaints   Review of Systems: Negative except as noted in the HPI. Denies N/V/F/Ch.  Past Medical History:  Diagnosis Date   Allergy    Anal fissure    Anemia    Arthritis    diffuse arthritis   Backache, unspecified    Barrett's esophagus    BPH (benign prostatic hyperplasia)    Diverticulosis of colon (without mention of hemorrhage)    DVT of leg (deep venous thrombosis) (HCC)    Esophageal reflux    Factor V Leiden deficiency (HCC)    GERD (gastroesophageal reflux disease)    hypertension    Hypertrophy of prostate with urinary obstruction and other lower urinary tract symptoms (LUTS)    Irritable bowel syndrome    Obstructive sleep apnea    Osteoarthrosis, unspecified whether generalized or localized, unspecified site    Other abnormal glucose    Overweight(278.02)    Pacemaker-MDT    Change out 2008   Sleep apnea 2016   Uses cpap   Syncope and collapse    Tubular adenoma of colon    Unspecified hereditary and idiopathic peripheral neuropathy     Current Outpatient Medications:    doxycycline (VIBRA-TABS) 100 MG tablet, Take 1 tablet (100 mg total) by mouth 2 (two) times daily., Disp: 20 tablet, Rfl: 0    acetaminophen (TYLENOL) 500 MG tablet, Take 500-1,000 mg by mouth every 6 (six) hours as needed for headache (or pain). , Disp: , Rfl:    ascorbic acid (VITAMIN C) 500 MG tablet, Take 1 tablet by mouth daily., Disp: , Rfl:    calcium carbonate (OS-CAL) 600 MG TABS, Take 600 mg by mouth daily., Disp: , Rfl:    chlorthalidone (HYGROTON) 25 MG tablet, Take 1 tablet (25 mg total) by mouth daily., Disp: 90 tablet, Rfl: 0   ferrous sulfate 325 (65 FE) MG tablet, Take 325 mg by mouth 2 (two) times daily with a meal. , Disp: , Rfl:    finasteride (PROSCAR) 5 MG tablet, Take 1 tablet by mouth daily., Disp: , Rfl:    FLUZONE HIGH-DOSE QUADRIVALENT 0.7 ML SUSY, , Disp: , Rfl:    furosemide (LASIX) 40 MG tablet, Take one tablet by mouth every other day x 5 doses then stop, Disp: 30 tablet, Rfl: 0   gabapentin (NEURONTIN) 300 MG capsule, Take 300 mg by mouth 2 (two) times daily., Disp: , Rfl:    gentamicin ointment (GARAMYCIN) 0.1 %, Apply 1 Application topically daily., Disp: 15 g, Rfl: 0   loratadine (CLARITIN) 10 MG tablet, Take 10 mg by mouth daily., Disp: , Rfl:    losartan (COZAAR) 100 MG tablet, Take 1 tablet (  100 mg total) by mouth daily., Disp: 90 tablet, Rfl: 0   Multiple Vitamin (MULTIVITAMIN) capsule, Take 1 capsule by mouth daily., Disp: , Rfl:    mupirocin ointment (BACTROBAN) 2 %, Apply 1 Application topically 2 (two) times daily., Disp: 22 g, Rfl: 0   naproxen (NAPROSYN) 500 MG tablet, TAKE 1 TABLET TWICE A DAY, Disp: 180 tablet, Rfl: 3   pantoprazole (PROTONIX) 40 MG tablet, TAKE ONE TABLET DAILY. MAKEAN APPOINTMENT FOR FURTHER REFILLS (Patient taking differently: 20 mg daily.), Disp: 90 tablet, Rfl: 3   tamsulosin (FLOMAX) 0.4 MG CAPS capsule, Take 0.4 mg by mouth daily., Disp: , Rfl:    traMADol (ULTRAM) 50 MG tablet, Take 50 mg by mouth 3 (three) times daily as needed., Disp: , Rfl:    XARELTO 15 MG TABS tablet, TAKE 1 TABLET DAILY WITH   SUPPER, Disp: 90 tablet, Rfl: 0  Current  Facility-Administered Medications:    0.9 %  sodium chloride infusion, 500 mL, Intravenous, Continuous, Pyrtle, Carie Caddy, MD  Social History   Tobacco Use  Smoking Status Never  Smokeless Tobacco Never    Allergies  Allergen Reactions   Shellfish Allergy Rash and Other (See Comments)    Sensitive to shellfish   Objective:  There were no vitals filed for this visit. There is no height or weight on file to calculate BMI. Constitutional Well developed. Well nourished.  Vascular Dorsalis pedis pulses palpable bilaterally. Posterior tibial pulses palpable bilaterally. Capillary refill normal to all digits.  No cyanosis or clubbing noted. Pedal hair growth normal.  Neurologic Normal speech. Oriented to person, place, and time. Epicritic sensation to light touch grossly present bilaterally.  Dermatologic Left second digit superficial ulceration likely due to body hallux into the second toe.  He has history of rheumatoid arthritis.  Limited range of motion noted at the second metatarsal PIPJ joint consistent with hammertoe contracture hallux valgus deformity noted.  Orthopedic: Normal joint ROM without pain or crepitus bilaterally. No visible deformities. No bony tenderness.   Radiographs: None Assessment:   1. Ulcer of left second toe, limited to breakdown of skin [facility])    Plan:  Patient was evaluated and treated and all questions answered.  Left second digit superficial ulceration limited to the breakdown of the skin -All questions and concerns were discussed with the patient in extensive detail -Given the amount of pain that he is having with this sort I encouraged him to continue doing triple antibiotic and a Band-Aid.  I also made a modification with a spacer to allow more spacing between the hallux and the great toe I think this will help improve the ulcer. -Doxycycline was dispensed for skin and soft tissue prophylaxis -I encouraged him to go to the emergency room if it gets  worse.  He states understanding  No follow-ups on file.

## 2023-10-13 ENCOUNTER — Ambulatory Visit (HOSPITAL_COMMUNITY): Payer: Medicare Other | Attending: Internal Medicine

## 2023-10-13 DIAGNOSIS — I429 Cardiomyopathy, unspecified: Secondary | ICD-10-CM | POA: Diagnosis present

## 2023-10-13 LAB — ECHOCARDIOGRAM COMPLETE
AR max vel: 1.43 cm2
AV Area VTI: 1.33 cm2
AV Area mean vel: 1.33 cm2
AV Mean grad: 14 mmHg
AV Peak grad: 24.6 mmHg
Ao pk vel: 2.48 m/s
Area-P 1/2: 2.97 cm2
P 1/2 time: 379 ms
S' Lateral: 3.5 cm

## 2023-10-13 MED ORDER — PERFLUTREN LIPID MICROSPHERE
3.0000 mL | INTRAVENOUS | Status: AC | PRN
Start: 2023-10-13 — End: 2023-10-13
  Administered 2023-10-13: 3 mL via INTRAVENOUS

## 2023-10-27 ENCOUNTER — Telehealth: Payer: Self-pay

## 2023-10-27 MED ORDER — EMPAGLIFLOZIN 10 MG PO TABS: 10.0000 mg | ORAL_TABLET | Freq: Every day | ORAL | 0 refills | Status: DC

## 2023-10-27 NOTE — Telephone Encounter (Signed)
 Spoke with pt and advised of echo result and recommendation per Dr Graciela Husbands.  Pt states he is willing he would be willing to try Jardiance 10mg  - 1 tablet by mouth daily before breakfast depending on cost.  Rx sent to local pharmacy as requested.  Pt verbalizes understanding of all information provided.

## 2023-10-27 NOTE — Telephone Encounter (Signed)
-----   Message from Sherryl Manges sent at 10/22/2023 12:43 PM EDT -----  Please Inform Patient Echo showed  normal  stable heart muscle function   We had discussed this scenario and that we would try SGLT 2  If he willing lets start farxiga/jardiance 10 mg daily    Thanks

## 2023-11-18 ENCOUNTER — Ambulatory Visit (INDEPENDENT_AMBULATORY_CARE_PROVIDER_SITE_OTHER): Payer: Medicare HMO

## 2023-11-18 DIAGNOSIS — I495 Sick sinus syndrome: Secondary | ICD-10-CM | POA: Diagnosis not present

## 2023-11-18 LAB — CUP PACEART REMOTE DEVICE CHECK
Battery Voltage: 55
Date Time Interrogation Session: 20250422083903
Implantable Lead Connection Status: 753985
Implantable Lead Connection Status: 753985
Implantable Lead Implant Date: 19990330
Implantable Lead Implant Date: 19990330
Implantable Lead Location: 753859
Implantable Lead Location: 753860
Implantable Lead Model: 5068
Implantable Lead Model: 5092
Implantable Pulse Generator Implant Date: 20181024
Pulse Gen Model: 407145
Pulse Gen Serial Number: 69203177

## 2023-11-25 ENCOUNTER — Ambulatory Visit: Payer: Medicare Other | Admitting: Podiatry

## 2023-12-15 ENCOUNTER — Telehealth: Payer: Self-pay | Admitting: Internal Medicine

## 2023-12-15 DIAGNOSIS — I10 Essential (primary) hypertension: Secondary | ICD-10-CM

## 2023-12-15 DIAGNOSIS — R609 Edema, unspecified: Secondary | ICD-10-CM

## 2023-12-15 MED ORDER — CHLORTHALIDONE 25 MG PO TABS
25.0000 mg | ORAL_TABLET | Freq: Every day | ORAL | 3 refills | Status: AC
Start: 2023-12-15 — End: ?

## 2023-12-15 NOTE — Telephone Encounter (Signed)
 Pt's medication was sent to pt's pharmacy as requested. Confirmation received.

## 2023-12-15 NOTE — Telephone Encounter (Signed)
*  STAT* If patient is at the pharmacy, call can be transferred to refill team.   1. Which medications need to be refilled? (please list name of each medication and dose if known) chlorthalidone  (HYGROTON ) 25 MG tablet   losartan  (COZAAR ) 100 MG tablet  2. Which pharmacy/location (including street and city if local pharmacy) is medication to be sent to? [facility] - [place], [place] - [address]  3. Do they need a 30 day or 90 day supply? 90

## 2023-12-22 ENCOUNTER — Other Ambulatory Visit: Payer: Self-pay | Admitting: Internal Medicine

## 2024-01-01 NOTE — Progress Notes (Signed)
 Remote pacemaker transmission.

## 2024-01-12 NOTE — Progress Notes (Signed)
 PATIENT: Tony Cruz DOB: August 22, 1933  REASON FOR VISIT: follow up HISTORY FROM: patient  No chief complaint on file.    HISTORY OF PRESENT ILLNESS:  01/12/24 ALL:  Tony Cruz returns for follow up for OSA on CPAP. He continues to do well on therapy.   01/07/23 ALL: Tony Cruz is a 89 y.o. male here today for follow up for OSA on CPAP. He is doing well. He is using therapy nightly for about 7 hours on average. He does report sleeping differently in the bed due to his wife's recent injury and feels he may be sleeping on his back more. He is not sure he sleeps any better with therapy but recognizes the health benefits. He is ready for new supplies.     12/27/2021 ALL:  Tony Cruz is a 89 y.o. male here today for follow up for OSA on CPAP.  He was last seen by Dr Omar Bibber 10/2021 and had been using his friend's CPAP machine due to his being broken. New CPAP orders placed. He returns for initial compliance eval. He is doing well on his new machine. He is using it every night for about 7-8 hours. He denies concerns with machine or supplies.     HISTORY: (copied from Dr Dail Drought previous note)  Tony Cruz is an 89 year old right-handed gentleman with an underlying medical history of arthritis, status post right total knee replacement in May 2021, BPH, DVT, reflux disease, factor V Leiden deficiency, hypertension, irritable bowel syndrome, diverticulosis, syncope, status post pacemaker, neuropathy, allergies, anemia, and obesity, who presents for follow-up consultation of his obstructive sleep apnea after interim home sleep testing.  The patient is unaccompanied today.  I first met him at the request of his primary care physician on 03/20/2021, at which time he reported a prior diagnosis of obstructive sleep apnea.  He was on CPAP therapy and reported compliance with treatment and benefit from treatment.  He was using a ResMed fullface mask, a compliance download was not  possible and prior sleep study results were not available.  He was advised to proceed with sleep testing.  He had a home sleep test on 05/02/2021 which indicated moderate obstructive sleep apnea with an AHI of 25.8/h, O2 nadir 79% with mostly mild to moderate snoring detected.  He was advised to start AutoPap therapy with a new machine.  He was not able to get his new machine as yet, which I prescribed in October 2022.  He needed an updated follow-up appointment and a new order. His DME company was [employer], he would like to switch providers.     Today, 11/13/2021: He reports doing fairly well.  He has been using a friend's machine since May 2022.  We were able to get data off his CPAP machine.  His friend has a ResMed S9 CPAP machine set at 7 cm.  Patient has been compliant with treatment, he has been using his machine consistently in the past 90 days with an average of 7 hours and 43 minutes, residual AHI slightly elevated at 6.4/h, leak on the higher side with the 95th percentile at 20.7 L/min, pressure at 7 cm without EPR.  He reports that he would like to get a new machine as soon as possible.  His old machine was also a ResMed S9 but he has not used it essentially since May of last year.  Has had ongoing benefit from CPAP therapy and is very motivated to continue with treatment.  He uses  a full facemask.  He limits his caffeine and drinks mostly decaf drinks.    REVIEW OF SYSTEMS: Out of a complete 14 system review of symptoms, the patient complains only of the following symptoms, none and all other reviewed systems are negative.   ALLERGIES: Allergies  Allergen Reactions   Shellfish Allergy Rash and Other (See Comments)    Sensitive to shellfish    HOME MEDICATIONS: Outpatient Medications Prior to Visit  Medication Sig Dispense Refill   acetaminophen  (TYLENOL ) 500 MG tablet Take 500-1,000 mg by mouth every 6 (six) hours as needed for headache (or pain).      ascorbic acid (VITAMIN C) 500 MG  tablet Take 1 tablet by mouth daily.     calcium carbonate (OS-CAL) 600 MG TABS Take 600 mg by mouth daily.     chlorthalidone  (HYGROTON ) 25 MG tablet Take 1 tablet (25 mg total) by mouth daily. 90 tablet 3   doxycycline  (VIBRA -TABS) 100 MG tablet Take 1 tablet (100 mg total) by mouth 2 (two) times daily. 20 tablet 0   empagliflozin  (JARDIANCE ) 10 MG TABS tablet Take 1 tablet (10 mg total) by mouth daily before breakfast. 30 tablet 0   ferrous sulfate  325 (65 FE) MG tablet Take 325 mg by mouth 2 (two) times daily with a meal.      finasteride (PROSCAR) 5 MG tablet Take 1 tablet by mouth daily.     FLUZONE HIGH-DOSE QUADRIVALENT 0.7 ML SUSY      furosemide  (LASIX ) 40 MG tablet Take one tablet by mouth every other day x 5 doses then stop 30 tablet 0   gabapentin  (NEURONTIN ) 300 MG capsule Take 300 mg by mouth 2 (two) times daily.     gentamicin  ointment (GARAMYCIN ) 0.1 % Apply 1 Application topically daily. 15 g 0   loratadine (CLARITIN) 10 MG tablet Take 10 mg by mouth daily.     losartan  (COZAAR ) 100 MG tablet TAKE 1 TABLET BY MOUTH DAILY 90 tablet 2   Multiple Vitamin (MULTIVITAMIN) capsule Take 1 capsule by mouth daily.     mupirocin  ointment (BACTROBAN ) 2 % Apply 1 Application topically 2 (two) times daily. 22 g 0   naproxen  (NAPROSYN ) 500 MG tablet TAKE 1 TABLET TWICE A DAY 180 tablet 3   pantoprazole  (PROTONIX ) 40 MG tablet TAKE ONE TABLET DAILY. MAKEAN APPOINTMENT FOR FURTHER REFILLS (Patient taking differently: 20 mg daily.) 90 tablet 3   tamsulosin (FLOMAX) 0.4 MG CAPS capsule Take 0.4 mg by mouth daily.     traMADol  (ULTRAM ) 50 MG tablet Take 50 mg by mouth 3 (three) times daily as needed.     XARELTO  15 MG TABS tablet TAKE 1 TABLET DAILY WITH   SUPPER 90 tablet 0   Facility-Administered Medications Prior to Visit  Medication Dose Route Frequency Provider Last Rate Last Admin   0.9 %  sodium chloride  infusion  500 mL Intravenous Continuous Pyrtle, Amber Bail, MD        PAST MEDICAL  HISTORY: Past Medical History:  Diagnosis Date   Allergy    Anal fissure    Anemia    Arthritis    diffuse arthritis   Backache, unspecified    Barrett's esophagus    BPH (benign prostatic hyperplasia)    Diverticulosis of colon (without mention of hemorrhage)    DVT of leg (deep venous thrombosis) (HCC)    Esophageal reflux    Factor V Leiden deficiency (HCC)    GERD (gastroesophageal reflux disease)    hypertension  Hypertrophy of prostate with urinary obstruction and other lower urinary tract symptoms (LUTS)    Irritable bowel syndrome    Obstructive sleep apnea    Osteoarthrosis, unspecified whether generalized or localized, unspecified site    Other abnormal glucose    Overweight(278.02)    Pacemaker-MDT    Change out 2008   Sleep apnea 2016   Uses cpap   Syncope and collapse    Tubular adenoma of colon    Unspecified hereditary and idiopathic peripheral neuropathy     PAST SURGICAL HISTORY: Past Surgical History:  Procedure Laterality Date   APPENDECTOMY     CHOLECYSTECTOMY     PACEMAKER PLACEMENT     PPM GENERATOR CHANGEOUT N/A 05/21/2017   Procedure: PPM GENERATOR CHANGEOUT;  Surgeon: Verona Goodwill, MD;  Location: [facility] INVASIVE CV LAB;  Service: Cardiovascular;  Laterality: N/A;   skin cancer removed  12/2012   removed from his face--Dr. Jerilynn Montenegro   TONSILLECTOMY     TOTAL KNEE ARTHROPLASTY Right 12/03/2019   Procedure: TOTAL KNEE ARTHROPLASTY;  Surgeon: Genevie Kerns, MD;  Location: [facility] ORS;  Service: Orthopedics;  Laterality: Right;  adductor canal    FAMILY HISTORY: Family History  Problem Relation Age of Onset   Coronary artery disease Mother    Coronary artery disease Father        bladder cancer   Prostate cancer Father    Bladder Cancer Father    Heart Problems Father    Diabetes Sister        2 sisters   Congestive Heart Failure Sister    CVA Sister    Diabetes Sister    Colon cancer Neg Hx    Stomach cancer Neg Hx    Pancreatic cancer Neg  Hx    Sleep apnea Neg Hx     SOCIAL HISTORY: Social History   Socioeconomic History   Marital status: Married    Spouse name: phyllis Dibble   Number of children: Not on file   Years of education: Not on file   Highest education level: Not on file  Occupational History   Occupation: Retired    Associate Professor: RETIRED  Tobacco Use   Smoking status: Never   Smokeless tobacco: Never  Vaping Use   Vaping status: Never Used  Substance and Sexual Activity   Alcohol  use: Yes    Alcohol /week: 0.0 standard drinks of alcohol     Comment: occasionally    Drug use: No   Sexual activity: Never    Birth control/protection: Abstinence  Other Topics Concern   Not on file  Social History Narrative   Not on file   Social Drivers of Health   Financial Resource Strain: Not on file  Food Insecurity: Low Risk  (11/06/2023)   Received from [facility]   Hunger Vital Sign    Within the past 12 months, you worried that your food would run out before you got money to buy more: Never true    Within the past 12 months, the food you bought just didn't last and you didn't have money to get more. : Never true  Transportation Needs: No Transportation Needs (11/06/2023)   Received from [facility]    In the past 12 months, has lack of reliable transportation kept you from medical appointments, meetings, work or from getting things needed for daily living? : No  Physical Activity: Not on file  Stress: Not on file  Social Connections: Not on file  Intimate Partner Violence: Not  on file     PHYSICAL EXAM  There were no vitals filed for this visit.  There is no height or weight on file to calculate BMI.  Generalized: Well developed, in no acute distress  Cardiology: normal rate and rhythm, no murmur noted Respiratory: clear to auscultation bilaterally  Neurological examination  Mentation: Alert oriented to time, place, history taking. Follows all commands speech and language  fluent Cranial nerve II-XII: Pupils were equal round reactive to light. Extraocular movements were full, visual field were full  Motor: The motor testing reveals 5 over 5 strength of all 4 extremities. Good symmetric motor tone is noted throughout.  Gait and station: Gait is normal.    DIAGNOSTIC DATA (LABS, IMAGING, TESTING) - I reviewed patient records, labs, notes, testing and imaging myself where available.      No data to display           Lab Results  Component Value Date   WBC 7.4 03/05/2023   HGB 13.9 03/05/2023   HCT 39.9 03/05/2023   MCV 101.8 (H) 03/05/2023   PLT 245 03/05/2023      Component Value Date/Time   NA 130 (L) 03/05/2023 0912   NA 131 (L) 11/08/2020 1152   K 3.9 03/05/2023 0912   CL 95 (L) 03/05/2023 0912   CO2 23 03/05/2023 0912   GLUCOSE 126 (H) 03/05/2023 0912   BUN 16 03/05/2023 0912   BUN 13 11/08/2020 1152   CREATININE 1.09 03/05/2023 0912   CREATININE 0.93 05/20/2016 1041   CALCIUM 8.6 (L) 03/05/2023 0912   PROT 6.0 (L) 03/05/2023 0912   PROT 6.0 10/18/2022 1524   ALBUMIN  3.5 03/05/2023 0912   AST 29 03/05/2023 0912   ALT 26 03/05/2023 0912   ALKPHOS 36 (L) 03/05/2023 0912   BILITOT 0.6 03/05/2023 0912   GFRNONAA >60 03/05/2023 0912   GFRAA 83 07/05/2020 1504   Lab Results  Component Value Date   CHOL 165 01/15/2018   HDL 48.90 01/15/2018   LDLCALC 86 01/15/2018   LDLDIRECT 83.0 01/23/2016   TRIG 149.0 01/15/2018   CHOLHDL 3 01/15/2018   Lab Results  Component Value Date   HGBA1C 6.1 01/23/2016   Lab Results  Component Value Date   VITAMINB12 434 01/14/2017   Lab Results  Component Value Date   TSH 2.090 11/08/2020     ASSESSMENT AND PLAN 89 y.o. year old male  has a past medical history of Allergy, Anal fissure, Anemia, Arthritis, Backache, unspecified, Barrett's esophagus, BPH (benign prostatic hyperplasia), Diverticulosis of colon (without mention of hemorrhage), DVT of leg (deep venous thrombosis) (HCC),  Esophageal reflux, Factor V Leiden deficiency (HCC), GERD (gastroesophageal reflux disease), hypertension, Hypertrophy of prostate with urinary obstruction and other lower urinary tract symptoms (LUTS), Irritable bowel syndrome, Obstructive sleep apnea, Osteoarthrosis, unspecified whether generalized or localized, unspecified site, Other abnormal glucose, Overweight(278.02), Pacemaker-MDT, Sleep apnea (2016), Syncope and collapse, Tubular adenoma of colon, and Unspecified hereditary and idiopathic peripheral neuropathy. here with   No diagnosis found.     Abdirizak Richison is doing well on CPAP therapy. Compliance report reveals excellent compliance. He was encouraged to continue using CPAP nightly and for greater than 4 hours each night. We will update supply orders as indicated. Risks of untreated sleep apnea review and education materials provided. Healthy lifestyle habits encouraged. He will follow up in 1 year, sooner if needed. He verbalizes understanding and agreement with this plan.    No orders of the defined types were placed  in this encounter.    No orders of the defined types were placed in this encounter.     Terrilyn Fick, FNP-C 01/12/2024, 4:19 PM [facility] [address] [place], [place] [address] [phone]

## 2024-01-12 NOTE — Progress Notes (Deleted)
 Tony Cruz

## 2024-01-12 NOTE — Patient Instructions (Signed)

## 2024-01-13 ENCOUNTER — Ambulatory Visit (INDEPENDENT_AMBULATORY_CARE_PROVIDER_SITE_OTHER): Admitting: Family Medicine

## 2024-01-13 ENCOUNTER — Telehealth: Payer: Medicare Other | Admitting: Family Medicine

## 2024-01-13 ENCOUNTER — Encounter: Payer: Self-pay | Admitting: Family Medicine

## 2024-01-13 VITALS — BP 122/74 | HR 86

## 2024-01-13 DIAGNOSIS — G4733 Obstructive sleep apnea (adult) (pediatric): Secondary | ICD-10-CM | POA: Diagnosis not present

## 2024-01-13 NOTE — Progress Notes (Signed)
 Tony Cruz

## 2024-01-14 ENCOUNTER — Ambulatory Visit: Admitting: Podiatry

## 2024-01-14 DIAGNOSIS — M79675 Pain in left toe(s): Secondary | ICD-10-CM

## 2024-01-14 DIAGNOSIS — M79674 Pain in right toe(s): Secondary | ICD-10-CM | POA: Diagnosis not present

## 2024-01-14 DIAGNOSIS — B351 Tinea unguium: Secondary | ICD-10-CM | POA: Diagnosis not present

## 2024-01-14 NOTE — Progress Notes (Signed)
 1  Subjective:  Patient ID: Tony Cruz, male    DOB: 01-23-1934,  MRN: [id number]  89 y.o. male presents with at risk foot care with history of coagulation defect and peripheral neuropathy. He is seen for painful elongated mycotic toenails 1-5 bilaterally which are tender when wearing enclosed shoe gear. Pain is relieved with periodic professional debridement. Chief Complaint  Patient presents with   Nail Problem    Nail trim      PCP: Barnetta Liberty, MD.  New problem(s): None.   Review of Systems: Negative except as noted in the HPI.   Allergies  Allergen Reactions   Shellfish Allergy Rash and Other (See Comments)    Sensitive to shellfish    Objective:  There were no vitals filed for this visit. Constitutional Patient is a pleasant 89 y.o. male in NAD. AAO x 3.  Vascular Capillary fill time to digits <3 seconds.  DP/PT pulse(s) are faintly palpable b/l lower extremities. Pedal hair absent b/l. Lower extremity skin temperature gradient warm to cool b/l. No pain with calf compression b/l. No cyanosis or clubbing noted. No ischemia nor gangrene noted b/l.   Neurologic Protective sensation diminished with 10g monofilament b/l.  Dermatologic Pedal skin is thin, shiny and atrophic b/l.  No open wounds b/l lower extremities. No interdigital macerations b/l lower extremities. Toenails 1-5 b/l elongated, discolored, dystrophic, thickened, crumbly with subungual debris and tenderness to dorsal palpation. No hyperkeratotic nor porokeratotic lesions present on today's visit.  Orthopedic: Normal muscle strength 5/5 to all lower extremity muscle groups bilaterally. HAV with bunion deformity noted b/l LE. Hammertoe deformity noted 2-5 b/l.   Last HgA1c:      No data to display         Assessment:   No diagnosis found.  Plan:  Patient was evaluated and treated and all questions answered. -Consent given for treatment as described below: -Examined patient. -Continue foot and  shoe inspections daily. Monitor blood glucose per PCP/Endocrinologist's recommendations. -Toenails 1-5 b/l were debrided in length and girth with sterile nail nippers and dremel without iatrogenic bleeding.  -Patient/POA to call should there be question/concern in the interim.  No follow-ups on file.  Velma Ghazi, DPM      [place] LOCATION: [address], [place] [address]                   Office [phone]   [facility] LOCATION: [address] [place], [place] [address] Office [phone]

## 2024-02-17 ENCOUNTER — Ambulatory Visit (INDEPENDENT_AMBULATORY_CARE_PROVIDER_SITE_OTHER): Payer: Medicare HMO

## 2024-02-17 DIAGNOSIS — I495 Sick sinus syndrome: Secondary | ICD-10-CM

## 2024-02-18 LAB — CUP PACEART REMOTE DEVICE CHECK
Battery Voltage: 50
Date Time Interrogation Session: 20250722100224
Implantable Lead Connection Status: 753985
Implantable Lead Connection Status: 753985
Implantable Lead Implant Date: 19990330
Implantable Lead Implant Date: 19990330
Implantable Lead Location: 753859
Implantable Lead Location: 753860
Implantable Lead Model: 5068
Implantable Lead Model: 5092
Implantable Pulse Generator Implant Date: 20181024
Pulse Gen Model: 407145
Pulse Gen Serial Number: 69203177

## 2024-02-19 ENCOUNTER — Ambulatory Visit: Payer: Self-pay | Admitting: Cardiology

## 2024-04-14 ENCOUNTER — Ambulatory Visit: Admitting: Podiatry

## 2024-04-26 ENCOUNTER — Other Ambulatory Visit (INDEPENDENT_AMBULATORY_CARE_PROVIDER_SITE_OTHER)

## 2024-04-26 ENCOUNTER — Ambulatory Visit: Admitting: Physician Assistant

## 2024-04-26 ENCOUNTER — Telehealth: Payer: Self-pay

## 2024-04-26 ENCOUNTER — Encounter: Payer: Self-pay | Admitting: Physician Assistant

## 2024-04-26 VITALS — BP 132/66 | HR 89 | Ht 69.0 in | Wt 223.0 lb

## 2024-04-26 DIAGNOSIS — R194 Change in bowel habit: Secondary | ICD-10-CM | POA: Diagnosis not present

## 2024-04-26 DIAGNOSIS — R195 Other fecal abnormalities: Secondary | ICD-10-CM

## 2024-04-26 DIAGNOSIS — D509 Iron deficiency anemia, unspecified: Secondary | ICD-10-CM

## 2024-04-26 DIAGNOSIS — R198 Other specified symptoms and signs involving the digestive system and abdomen: Secondary | ICD-10-CM

## 2024-04-26 DIAGNOSIS — K582 Mixed irritable bowel syndrome: Secondary | ICD-10-CM | POA: Diagnosis not present

## 2024-04-26 DIAGNOSIS — R143 Flatulence: Secondary | ICD-10-CM

## 2024-04-26 DIAGNOSIS — Z7901 Long term (current) use of anticoagulants: Secondary | ICD-10-CM

## 2024-04-26 DIAGNOSIS — R066 Hiccough: Secondary | ICD-10-CM

## 2024-04-26 LAB — CBC WITH DIFFERENTIAL/PLATELET
Basophils Absolute: 0.2 K/uL — ABNORMAL HIGH (ref 0.0–0.1)
Basophils Relative: 2 % (ref 0.0–3.0)
Eosinophils Absolute: 1.1 K/uL — ABNORMAL HIGH (ref 0.0–0.7)
Eosinophils Relative: 13 % — ABNORMAL HIGH (ref 0.0–5.0)
HCT: 38.1 % — ABNORMAL LOW (ref 39.0–52.0)
Hemoglobin: 12.9 g/dL — ABNORMAL LOW (ref 13.0–17.0)
Lymphocytes Relative: 25.2 % (ref 12.0–46.0)
Lymphs Abs: 2.1 K/uL (ref 0.7–4.0)
MCHC: 34 g/dL (ref 30.0–36.0)
MCV: 99.9 fl (ref 78.0–100.0)
Monocytes Absolute: 1 K/uL (ref 0.1–1.0)
Monocytes Relative: 12.6 % — ABNORMAL HIGH (ref 3.0–12.0)
Neutro Abs: 3.9 K/uL (ref 1.4–7.7)
Neutrophils Relative %: 47.2 % (ref 43.0–77.0)
Platelets: 254 K/uL (ref 150.0–400.0)
RBC: 3.82 Mil/uL — ABNORMAL LOW (ref 4.22–5.81)
RDW: 13.8 % (ref 11.5–15.5)
WBC: 8.2 K/uL (ref 4.0–10.5)

## 2024-04-26 LAB — COMPREHENSIVE METABOLIC PANEL WITH GFR
ALT: 22 U/L (ref 0–53)
AST: 22 U/L (ref 0–37)
Albumin: 3.8 g/dL (ref 3.5–5.2)
Alkaline Phosphatase: 44 U/L (ref 39–117)
BUN: 18 mg/dL (ref 6–23)
CO2: 27 meq/L (ref 19–32)
Calcium: 8.7 mg/dL (ref 8.4–10.5)
Chloride: 95 meq/L — ABNORMAL LOW (ref 96–112)
Creatinine, Ser: 0.91 mg/dL (ref 0.40–1.50)
GFR: 75.32 mL/min (ref >60.00)
Glucose, Bld: 114 mg/dL — ABNORMAL HIGH (ref 70–99)
Potassium: 3.6 meq/L (ref 3.5–5.1)
Sodium: 129 meq/L — ABNORMAL LOW (ref 135–145)
Total Bilirubin: 0.4 mg/dL (ref 0.2–1.2)
Total Protein: 5.8 g/dL — ABNORMAL LOW (ref 6.0–8.3)

## 2024-04-26 NOTE — Progress Notes (Signed)
 Ellouise Console, PA-C [address] [place], [place]  [address] Phone: [phone]   Primary Care Physician: Larnell Hamilton, MD  Primary Gastroenterologist:  Ellouise Console, PA-C / Dr. Gordy Starch   Chief Complaint:  IBS, GERD, anemia, dark stools, gas   HPI:   Tony Cruz is a 89 y.o. male presents for change in bowel habits, dark stools, loose stools, increased intestinal gas.  A few months ago he was constipated for 6 weeks.  He tried OTC fiber supplement with little benefit.  In the past 2 months his stools changed from constipation to loose stools.  He has been having 4 loose bowel movements daily with the episodes of explosive bowel movement.  Stools have been darker.  He denies bright red rectal bleeding.  Currently on iron twice daily due to history of iron deficiency anemia.  He has been drinking Crystal light tea with artificial sweetener.  No new medications.  He has had occasional episode of fecal incontinence and passes stool when he passes gas.  He admits to taking naproxen  and NSAIDs for arthritis.  He denies abdominal pain or weight loss.  He reports history of IBS for many years.  Currently on iron tablet twice daily, pantoprazole  20 Mg once daily, Xarelto , and Naprosyn .  He had workup for iron deficiency anemia and heme + stool in 2021 - 2022.  Was on Xarelto .  06/2020 last EGD: Mild to moderate gastritis.  1 cm hiatal hernia.  A few small benign fundic gland gastric polyps.  Normal esophagus and duodenum.  06/2020 last Colonoscopy: Small internal hemorrhoids.  Pandiverticulosis.  No polyps.  Good prep.  No repeat due to advanced age.  03/2021 Capsule Endoscopy: Adequate prep.  Several erosions in the proximal small bowel.  Large erosion at 49-minute mark.  Ulceration at 59 minutes and 1 hour 16 minutes.  PMH:  PUD, Gastritis, Arthritis, IBS, DVT, HTN, SA node dysfunction, sleep apnea, factor V Leiden, peripheral neuropathy, pacemaker in place, currently on  Xarelto .  Current Outpatient Medications  Medication Sig Dispense Refill   acetaminophen  (TYLENOL ) 500 MG tablet Take 500-1,000 mg by mouth every 6 (six) hours as needed for headache (or pain).      ascorbic acid (VITAMIN C) 500 MG tablet Take 1 tablet by mouth daily.     calcium carbonate (OS-CAL) 600 MG TABS Take 600 mg by mouth daily.     chlorthalidone  (HYGROTON ) 25 MG tablet Take 1 tablet (25 mg total) by mouth daily. 90 tablet 3   ferrous sulfate  325 (65 FE) MG tablet Take 325 mg by mouth 2 (two) times daily with a meal.      finasteride (PROSCAR) 5 MG tablet Take 1 tablet by mouth daily.     FLUZONE HIGH-DOSE QUADRIVALENT 0.7 ML SUSY      gabapentin  (NEURONTIN ) 300 MG capsule Take 300 mg by mouth 2 (two) times daily.     losartan  (COZAAR ) 100 MG tablet TAKE 1 TABLET BY MOUTH DAILY 90 tablet 2   Multiple Vitamin (MULTIVITAMIN) capsule Take 1 capsule by mouth daily.     pantoprazole  (PROTONIX ) 40 MG tablet TAKE ONE TABLET DAILY. MAKEAN APPOINTMENT FOR FURTHER REFILLS 90 tablet 3   tamsulosin (FLOMAX) 0.4 MG CAPS capsule Take 0.4 mg by mouth daily.     traMADol  (ULTRAM ) 50 MG tablet Take 50 mg by mouth 3 (three) times daily as needed.     XARELTO  15 MG TABS tablet TAKE 1 TABLET DAILY WITH  SUPPER 90 tablet 0   doxycycline  (VIBRA -TABS) 100 MG tablet Take 1 tablet (100 mg total) by mouth 2 (two) times daily. (Patient not taking: Reported on 04/26/2024) 20 tablet 0   furosemide  (LASIX ) 40 MG tablet Take one tablet by mouth every other day x 5 doses then stop (Patient not taking: Reported on 04/26/2024) 30 tablet 0   gentamicin  ointment (GARAMYCIN ) 0.1 % Apply 1 Application topically daily. 15 g 0   mupirocin  ointment (BACTROBAN ) 2 % Apply 1 Application topically 2 (two) times daily. 22 g 0   rosuvastatin (CRESTOR) 5 MG tablet Take 5 mg by mouth daily.     Current Facility-Administered Medications  Medication Dose Route Frequency Provider Last Rate Last Admin   0.9 %  sodium chloride   infusion  500 mL Intravenous Continuous Pyrtle, Gordy HERO, MD        Allergies as of 04/26/2024 - Review Complete 04/26/2024  Allergen Reaction Noted   Shellfish allergy Rash and Other (See Comments) 01/24/2012    Past Medical History:  Diagnosis Date   Allergy    Anal fissure    Anemia    Arthritis    diffuse arthritis   Backache, unspecified    Barrett's esophagus    BPH (benign prostatic hyperplasia)    Diverticulosis of colon (without mention of hemorrhage)    DVT of leg (deep venous thrombosis) (HCC)    Esophageal reflux    Factor V Leiden deficiency (HCC)    GERD (gastroesophageal reflux disease)    hypertension    Hypertrophy of prostate with urinary obstruction and other lower urinary tract symptoms (LUTS)    Irritable bowel syndrome    Obstructive sleep apnea    Osteoarthrosis, unspecified whether generalized or localized, unspecified site    Other abnormal glucose    Overweight(278.02)    Pacemaker-MDT    Change out 2008   Sleep apnea 2016   Uses cpap   Syncope and collapse    Tubular adenoma of colon    Unspecified hereditary and idiopathic peripheral neuropathy     Past Surgical History:  Procedure Laterality Date   APPENDECTOMY     CATARACT EXTRACTION     CHOLECYSTECTOMY     PACEMAKER PLACEMENT     PPM GENERATOR CHANGEOUT N/A 05/21/2017   Procedure: PPM GENERATOR CHANGEOUT;  Surgeon: Fernande Elspeth BROCKS, MD;  Location: [facility] INVASIVE CV LAB;  Service: Cardiovascular;  Laterality: N/A;   skin cancer removed  12/27/2012   removed from his face--Dr. Jadine   TONSILLECTOMY     TOTAL KNEE ARTHROPLASTY Right 12/03/2019   Procedure: TOTAL KNEE ARTHROPLASTY;  Surgeon: Gerome Charleston, MD;  Location: [facility] ORS;  Service: Orthopedics;  Laterality: Right;  adductor canal    Review of Systems:    All systems reviewed and negative except where noted in HPI.    Physical Exam:  BP 132/66   Pulse 89   Ht 5' 9 (1.753 m)   Wt 223 lb (101.2 kg)   SpO2 95%   BMI 32.93  kg/m  No LMP for male patient.  General: Well-nourished, well-developed, overweight, in no acute distress.  Appears younger than stated age.  Walks with normal gait.  No assistive walking devices.  Is able to get on and off exam table with no assistance. Lungs: Clear to auscultation bilaterally. Non-labored. Heart: Regular rate and rhythm, no murmurs rubs or gallops.  Abdomen: Bowel sounds are normal; Abdomen is Soft; No hepatosplenomegaly, masses or hernias;  No Abdominal Tenderness; No guarding or  rebound tenderness. Rectal: Stool is black and heme positive.  No hemorrhoids or external lesions.  Normal anal sphincter tone.  No rectal masses or tenderness. Neuro: Alert and oriented x 3.  Grossly intact.  Psych: Alert and cooperative, normal mood and affect.  Chaperone for Exam:  Alethea Blocker, CMA   Imaging Studies: No results found.  Labs: CBC    Component Value Date/Time   WBC 8.2 04/26/2024 1630   RBC 3.82 (L) 04/26/2024 1630   HGB 12.9 (L) 04/26/2024 1630   HGB 12.8 (L) 11/08/2020 1152   HCT 38.1 (L) 04/26/2024 1630   HCT 38.4 11/08/2020 1152   PLT 254.0 04/26/2024 1630   PLT 270 11/08/2020 1152   MCV 99.9 04/26/2024 1630   MCV 98 (H) 11/08/2020 1152   MCH 35.5 (H) 03/05/2023 0912   MCHC 34.0 04/26/2024 1630   RDW 13.8 04/26/2024 1630   RDW 13.5 11/08/2020 1152   LYMPHSABS 2.1 04/26/2024 1630   LYMPHSABS WILL FOLLOW 07/05/2020 1504   MONOABS 1.0 04/26/2024 1630   EOSABS 1.1 (H) 04/26/2024 1630   EOSABS WILL FOLLOW 07/05/2020 1504   BASOSABS 0.2 (H) 04/26/2024 1630   BASOSABS WILL FOLLOW 07/05/2020 1504    CMP     Component Value Date/Time   NA 130 (L) 03/05/2023 0912   NA 131 (L) 11/08/2020 1152   K 3.9 03/05/2023 0912   CL 95 (L) 03/05/2023 0912   CO2 23 03/05/2023 0912   GLUCOSE 126 (H) 03/05/2023 0912   BUN 16 03/05/2023 0912   BUN 13 11/08/2020 1152   CREATININE 1.09 03/05/2023 0912   CREATININE 0.93 05/20/2016 1041   CALCIUM 8.6 (L) 03/05/2023 0912    PROT 6.0 (L) 03/05/2023 0912   PROT 6.0 10/18/2022 1524   ALBUMIN  3.5 03/05/2023 0912   AST 29 03/05/2023 0912   ALT 26 03/05/2023 0912   ALKPHOS 36 (L) 03/05/2023 0912   BILITOT 0.6 03/05/2023 0912   GFRNONAA >60 03/05/2023 0912   GFRAA 83 07/05/2020 1504       Assessment and Plan:   Derren Suydam is a 89 y.o. y/o male presents for:  1.  Dark heme positive stool.  Evaluate for upper GI bleed.  He has history of erosive gastritis and small bowel erosions in 2021 and 2022.  Colonoscopy was normal.  He has current NSAID use for arthritis. - Lengthy discussion with patient regarding risk of NSAIDs which can increase bleeding ulcers. - I advised patient to stop all NSAIDs.  Okay to take Tylenol  and tramadol  as needed for pain. - Scheduling EGD with enteroscopy in [facility] I discussed risks of EGD with patient to include risk of bleeding, perforation, and risk of sedation.  Patient expressed understanding and agrees to proceed with EGD.   2.  Iron deficiency anemia - Labs: CBC, iron panel, ferritin,   3.  Change in bowel habits / irritable bowel syndrome / Loose stools - Lab: TSH, CMP - Low FODMAP diet given.  Avoid artificial sweeteners. - Start align probiotic 1 capsule once daily for 1 month. - Start Metamucil 1 packet daily.  4.  GERD / hiccups (no current active hiccups today). - Continue pantoprazole  40 mg once daily. Recommend Lifestyle Modifications to prevent Acid Reflux.  Rec. Avoid coffee, sodas, peppermint, garlic, onions, alcohol , citrus fruits, chocolate, tomatoes, fatty and spicey foods.  Avoid eating 2-3 hours before bedtime.    5.  Comorbidities:  PUD, Gastritis, Arthritis, IBS, DVT, HTN, SA node dysfunction, sleep apnea, factor V  Leiden, peripheral neuropathy, pacemaker in place, currently on Xarelto . - Requesting cardiac permission to hold Xarelto  2 days prior to EGD procedure.   Ellouise Console, PA-C  Follow up 4 weeks after EGD with TG.

## 2024-04-26 NOTE — Patient Instructions (Addendum)
 Your provider has requested that you go to the basement level for lab work before leaving today. Press B on the elevator. The lab is located at the first door on the left as you exit the elevator.  Please DO NOT take any Nsaids (aspirin, ibuprofen, naproxen )   Start Metamucil (Psyllium Husk) Fiber Powder: Metamucil powders: Put 1-2 rounded spoons in an empty glass. If you're taking Metamucil Sugar-Free Powder or Premium Blend, use a teaspoon. If you're taking Metamucil with Real Sugar, use a tablespoon. Mix briskly with 8 oz. or more of cool liquid. Drink promptly, and enjoy! Drink 64 ounces of water  or other liquids daily.     Take 1 Capsule Once Daily for 30 days. If GI symptoms improve, then OK to continue Align. If GI symptoms do not improve after 30 days, then discontinue.   Thank you for trusting me with your gastrointestinal care!   Ellouise Console, PA-C  You have been scheduled for an Endoscopy. Please follow written instructions given to you at your visit today.  If you use inhalers (even only as needed), please bring them with you on the day of your procedure.  If you take any of the following medications, they will need to be adjusted prior to your procedure:   DO NOT TAKE 7 DAYS PRIOR TO TEST- Trulicity (dulaglutide) Ozempic, Wegovy (semaglutide) Mounjaro (tirzepatide) Bydureon Bcise (exanatide extended release)  DO NOT TAKE 1 DAY PRIOR TO YOUR TEST Rybelsus (semaglutide) Adlyxin (lixisenatide) Victoza (liraglutide) Byetta (exanatide) ___________________________________________________________________________  Please follow up sooner if symptoms increase or worsen  Due to recent changes in healthcare laws, you may see the results of your imaging and laboratory studies on MyChart before your provider has had a chance to review them.  We understand that in some cases there may be results that are confusing or concerning to you. Not all laboratory results come back  in the same time frame and the provider may be waiting for multiple results in order to interpret others.  Please give us  48 hours in order for your provider to thoroughly review all the results before contacting the office for clarification of your results.  _______________________________________________________  If your blood pressure at your visit was 140/90 or greater, please contact your primary care physician to follow up on this.  _______________________________________________________  If you are age 88 or older, your body mass index should be between 23-30. Your Body mass index is 32.93 kg/m. If this is out of the aforementioned range listed, please consider follow up with your Primary Care Provider.  If you are age 64 or younger, your body mass index should be between 19-25. Your Body mass index is 32.93 kg/m. If this is out of the aformentioned range listed, please consider follow up with your Primary Care Provider.   ________________________________________________________  The [facility] providers would like to encourage you to use MYCHART to communicate with providers for non-urgent requests or questions.  Due to long hold times on the telephone, sending your provider a message by [facility] may be a faster and more efficient way to get a response.  Please allow 48 business hours for a response.  Please remember that this is for non-urgent requests.  _______________________________________________________

## 2024-04-26 NOTE — Telephone Encounter (Signed)
 N/a

## 2024-04-27 ENCOUNTER — Ambulatory Visit: Payer: Self-pay | Admitting: Physician Assistant

## 2024-04-27 DIAGNOSIS — D649 Anemia, unspecified: Secondary | ICD-10-CM

## 2024-04-27 LAB — IRON,TIBC AND FERRITIN PANEL
%SAT: 46 % (ref 20–48)
Ferritin: 24 ng/mL (ref 24–380)
Iron: 145 ug/dL (ref 50–180)
TIBC: 316 ug/dL (ref 250–425)

## 2024-04-27 LAB — TSH: TSH: 1.9 u[IU]/mL (ref 0.35–5.50)

## 2024-04-27 NOTE — Telephone Encounter (Signed)
 [facility] Pre-operative Risk Assessment     Request for surgical clearance:     Endoscopy Procedure  What type of surgery is being performed?     Endoscopy  When is this surgery scheduled?     05/24/24  What type of clearance is required ?   Pharmacy  Are there any medications that need to be held prior to surgery and how long? Xarelto  2 days  Practice name and name of physician performing surgery?       Gastroenterology  What is your office phone and fax number?      Phone- [phone]  Fax- [phone]  Anesthesia type (None, local, MAC, general) ?       MAC   Please route your response to Alethea Blocker, CMA

## 2024-04-27 NOTE — Progress Notes (Signed)
 Please call lab and add on vitamin B12 and folate.  Diagnosis anemia unspecified. Ellouise Console, PA-C

## 2024-04-27 NOTE — Telephone Encounter (Signed)
 Clinical pharmacist to review Xarelto .  Patient has a history of DVT and factor V Leiden.

## 2024-04-29 ENCOUNTER — Ambulatory Visit

## 2024-04-29 ENCOUNTER — Ambulatory Visit: Payer: Self-pay | Admitting: Physician Assistant

## 2024-04-29 DIAGNOSIS — D649 Anemia, unspecified: Secondary | ICD-10-CM

## 2024-04-29 LAB — VITAMIN B12: Vitamin B-12: 342 pg/mL (ref 211–911)

## 2024-04-29 LAB — FOLATE: Folate: >22.9 ng/mL (ref >5.9)

## 2024-04-30 NOTE — Progress Notes (Signed)
 Remote PPM Transmission

## 2024-05-03 NOTE — Telephone Encounter (Signed)
 Patient has hx of DVT and factor V leiden. Xarelto  is managed by PCP. Will defer to PCP for approval on hold of 2 days.

## 2024-05-03 NOTE — Telephone Encounter (Signed)
   Name: Tony Cruz  DOB: October 26, 1933  MRN: [id number]   Primary Cardiologist: Elspeth Sage, MD  Chart reviewed as part of pre-operative protocol coverage.   Xarelto  prescribed by a noncardiology provider therefore recommendations for holding deferred to prescribing provider.    I will route this recommendation to the requesting party via Epic fax function and remove from pre-op pool. Please call with questions.  Barnie Hila, NP 05/03/2024, 5:09 PM

## 2024-05-12 ENCOUNTER — Ambulatory Visit: Admitting: Podiatry

## 2024-05-12 DIAGNOSIS — M79675 Pain in left toe(s): Secondary | ICD-10-CM | POA: Diagnosis not present

## 2024-05-12 DIAGNOSIS — M79674 Pain in right toe(s): Secondary | ICD-10-CM | POA: Diagnosis not present

## 2024-05-12 DIAGNOSIS — B351 Tinea unguium: Secondary | ICD-10-CM

## 2024-05-12 DIAGNOSIS — G629 Polyneuropathy, unspecified: Secondary | ICD-10-CM

## 2024-05-12 NOTE — Progress Notes (Signed)
 1  Subjective:  Patient ID: Tony Cruz, male    DOB: 1933/11/26,  MRN: [id number]  89 y.o. male presents with at risk foot care with history of coagulation defect and peripheral neuropathy. He is seen for painful elongated mycotic toenails 1-5 bilaterally which are tender when wearing enclosed shoe gear. Pain is relieved with periodic professional debridement. Chief Complaint  Patient presents with   Nail Problem    Nail trim      PCP: Larnell Hamilton, MD.  New problem(s): None.   Review of Systems: Negative except as noted in the HPI.   Allergies  Allergen Reactions   Shellfish Allergy Rash and Other (See Comments)    Sensitive to shellfish    Objective:  There were no vitals filed for this visit. Constitutional Patient is a pleasant 89 y.o. male in NAD. AAO x 3.  Vascular Capillary fill time to digits <3 seconds.  DP/PT pulse(s) are faintly palpable b/l lower extremities. Pedal hair absent b/l. Lower extremity skin temperature gradient warm to cool b/l. No pain with calf compression b/l. No cyanosis or clubbing noted. No ischemia nor gangrene noted b/l.   Neurologic Protective sensation diminished with 10g monofilament b/l.  Dermatologic Pedal skin is thin, shiny and atrophic b/l.  No open wounds b/l lower extremities. No interdigital macerations b/l lower extremities. Toenails 1-5 b/l elongated, discolored, dystrophic, thickened, crumbly with subungual debris and tenderness to dorsal palpation. No hyperkeratotic nor porokeratotic lesions present on today's visit.  Orthopedic: Normal muscle strength 5/5 to all lower extremity muscle groups bilaterally. HAV with bunion deformity noted b/l LE. Hammertoe deformity noted 2-5 b/l.   Last HgA1c:      No data to display         Assessment:   1. Neuropathy     Plan:  Patient was evaluated and treated and all questions answered. -Consent given for treatment as described below: -Examined patient. -Continue foot and shoe  inspections daily. Monitor blood glucose per PCP/Endocrinologist's recommendations. -Toenails 1-5 b/l were debrided in length and girth with sterile nail nippers and dremel without iatrogenic bleeding.  -Patient/POA to call should there be question/concern in the interim.  Peripheral neuropathy - Given the amount of peripheral neuropathy is present patient will benefit from referral to [facility] for advanced neuropathy treatment I discussed this with the patient he states understanding would like to proceed with the referral - Referral for Dr. Marcelino was placed  No follow-ups on file.  Franky SHAUNNA Blanch, DPM      [facility] LOCATION: [address], [place] [address]                   Office [phone]   [facility] LOCATION: [address] [place], [place] [address] Office [phone]

## 2024-05-18 ENCOUNTER — Ambulatory Visit: Payer: Medicare HMO

## 2024-05-18 DIAGNOSIS — I495 Sick sinus syndrome: Secondary | ICD-10-CM | POA: Diagnosis not present

## 2024-05-20 LAB — CUP PACEART REMOTE DEVICE CHECK
Date Time Interrogation Session: 20251021084927
Implantable Lead Connection Status: 753985
Implantable Lead Connection Status: 753985
Implantable Lead Implant Date: 19990330
Implantable Lead Implant Date: 19990330
Implantable Lead Location: 753859
Implantable Lead Location: 753860
Implantable Lead Model: 5068
Implantable Lead Model: 5092
Implantable Pulse Generator Implant Date: 20181024
Pulse Gen Model: 407145
Pulse Gen Serial Number: 69203177

## 2024-05-21 NOTE — Progress Notes (Signed)
 Remote PPM Transmission

## 2024-05-24 ENCOUNTER — Encounter: Payer: Self-pay | Admitting: Internal Medicine

## 2024-05-24 ENCOUNTER — Ambulatory Visit: Payer: Self-pay | Admitting: Cardiology

## 2024-05-24 ENCOUNTER — Ambulatory Visit: Admitting: Internal Medicine

## 2024-05-24 VITALS — BP 128/78 | HR 79 | Temp 98.0°F | Resp 21 | Ht 69.0 in | Wt 223.0 lb

## 2024-05-24 DIAGNOSIS — K317 Polyp of stomach and duodenum: Secondary | ICD-10-CM | POA: Diagnosis not present

## 2024-05-24 DIAGNOSIS — D509 Iron deficiency anemia, unspecified: Secondary | ICD-10-CM

## 2024-05-24 DIAGNOSIS — Z0189 Encounter for other specified special examinations: Secondary | ICD-10-CM

## 2024-05-24 DIAGNOSIS — K449 Diaphragmatic hernia without obstruction or gangrene: Secondary | ICD-10-CM

## 2024-05-24 DIAGNOSIS — R195 Other fecal abnormalities: Secondary | ICD-10-CM | POA: Diagnosis not present

## 2024-05-24 MED ORDER — RIFAXIMIN 550 MG PO TABS: ORAL_TABLET | ORAL | 0 refills | Status: DC

## 2024-05-24 MED ORDER — SODIUM CHLORIDE 0.9 % IV SOLN: 500.0000 mL | Freq: Once | INTRAVENOUS | Status: DC

## 2024-05-24 NOTE — Patient Instructions (Addendum)
 Continue present medications. Given loose stools, gas symptoms -- use rifaximin  550 mg three times daily x 14 days for IBS-D. Office follow-up with me thereafter. Okay to resume Xarelto  tomorrow per managing provider. Await pathology results.   YOU HAD AN ENDOSCOPIC PROCEDURE TODAY AT THE [facility]:   Refer to the procedure report that was given to you for any specific questions about what was found during the examination.  If the procedure report does not answer your questions, please call your gastroenterologist to clarify.  If you requested that your care partner not be given the details of your procedure findings, then the procedure report has been included in a sealed envelope for you to review at your convenience later.  YOU SHOULD EXPECT: Some feelings of bloating in the abdomen. Passage of more gas than usual.  Walking can help get rid of the air that was put into your GI tract during the procedure and reduce the bloating. Please Note:  You might notice some irritation and congestion in your nose or some drainage.  This is from the oxygen used during your procedure.  There is no need for concern and it should clear up in a day or so.  SYMPTOMS TO REPORT IMMEDIATELY: Following upper endoscopy (EGD)  Vomiting of blood or coffee ground material  New chest pain or pain under the shoulder blades  Painful or persistently difficult swallowing  New shortness of breath  Fever of 100F or higher  Black, tarry-looking stools  For urgent or emergent issues, a gastroenterologist can be reached at any hour by calling [phone]. Do not use MyChart messaging for urgent concerns.    DIET:  We do recommend a small meal at first, but then you may proceed to your regular diet.  Drink plenty of fluids but you should avoid alcoholic beverages for 24 hours.  ACTIVITY:  You should plan to take it easy for the rest of today and you should NOT DRIVE or use heavy machinery until tomorrow  (because of the sedation medicines used during the test).    FOLLOW UP: Our staff will call the number listed on your records the next business day following your procedure.  We will call around 7:15- 8:00 am to check on you and address any questions or concerns that you may have regarding the information given to you following your procedure. If we do not reach you, we will leave a message.     If any biopsies were taken you will be contacted by phone or by letter within the next 1-3 weeks.  Please call us  at [phone] if you have not heard about the biopsies in 3 weeks.    SIGNATURES/CONFIDENTIALITY: You and/or your care partner have signed paperwork which will be entered into your electronic medical record.  These signatures attest to the fact that that the information above on your After Visit Summary has been reviewed and is understood.  Full responsibility of the confidentiality of this discharge information lies with you and/or your care-partner.

## 2024-05-24 NOTE — Op Note (Addendum)
 [facility] Patient Name: Tony Cruz Procedure Date: 05/24/2024 10:17 AM MRN: [id number] Endoscopist: Gordy CHRISTELLA Starch , MD, [id number] Age: 89 Referring MD:  Date of Birth: 1934-07-09 Gender: Male Account #: [id number] Procedure:                Small bowel enteroscopy Indications:              Heme positive stool; History of intermittent heme                            stool leading to upper endoscopy, colonoscopy and                            video capsule endoscopy in late 2021 early 2022;                            video capsule endoscopy revealed scattered erosions                            and ulceration in the distal duodenum and proximal                            jejunum; recent smaller/looser more frequent stools                            with gas/flatulence symptom Medicines:                Monitored Anesthesia Care Procedure:                Pre-Anesthesia Assessment:                           - Prior to the procedure, a History and Physical                            was performed, and patient medications and                            allergies were reviewed. The patient's tolerance of                            previous anesthesia was also reviewed. The risks                            and benefits of the procedure and the sedation                            options and risks were discussed with the patient.                            All questions were answered, and informed consent                            was obtained. Prior Anticoagulants: The patient has  taken Xarelto  (rivaroxaban ), last dose was 2 days                            prior to procedure. ASA Grade Assessment: III - A                            patient with severe systemic disease. After                            reviewing the risks and benefits, the patient was                            deemed in satisfactory condition to undergo the                             procedure.                           After obtaining informed consent, the endoscope was                            passed under direct vision. Throughout the                            procedure, the patient's blood pressure, pulse, and                            oxygen saturations were monitored continuously. The                            PCF-H190TL Slim SN [id number] was introduced through                            the mouth, and advanced to the proximal jejunum.                            After obtaining informed consent, the endoscope was                            passed under direct vision. Throughout the                            procedure, the patient's blood pressure, pulse, and                            oxygen saturations were monitored continuously.The                            upper GI endoscopy was accomplished without                            difficulty. The patient tolerated the procedure  well. Scope In: Scope Out: Findings:                 The examined esophagus was normal.                           A 2 cm hiatal hernia was present.                           The gastroesophageal flap valve was visualized                            endoscopically and classified as Hill Grade IV (no                            fold, wide open lumen, hiatal hernia present).                           Multiple small sessile polyps with no bleeding and                            no stigmata of recent bleeding were found in the                            cardia, in the gastric fundus and in the gastric                            body.                           The examined duodenum was normal.                           The examined jejunum was normal. Biopsies were                            taken with a cold forceps for histology. Complications:            No immediate complications. Estimated Blood Loss:     Estimated blood loss: none. Impression:                - Normal esophagus.                           - 2 cm hiatal hernia.                           - Multiple gastric polyps. Benign appearing and                            most consistent with fundic gland polyps.                           - Normal examined duodenum.                           - Normal examined jejunum. Biopsied. Recommendation:           -  Patient has a contact number available for                            emergencies. The signs and symptoms of potential                            delayed complications were discussed with the                            patient. Return to normal activities tomorrow.                            Written discharge instructions were provided to the                            patient.                           - Resume previous diet.                           - Continue present medications.                           - Given loose stools, gas symptoms -- use rifaximin                            550 mg TID x 14 days for IBS-D. Office follow-up                            with me thereafter.                           - Okay to resume Xarelto  tomorrow per managing                            provider.                           - Await pathology results. Gordy CHRISTELLA Starch, MD 05/24/2024 10:44:04 AM This report has been signed electronically.

## 2024-05-24 NOTE — Progress Notes (Signed)
 Report given to PACU, vss

## 2024-05-24 NOTE — Progress Notes (Signed)
 VS by KP

## 2024-05-24 NOTE — Progress Notes (Signed)
1009 Robinul 0.1 mg IV given due large amount of secretions upon assessment.  MD made aware, vss

## 2024-05-24 NOTE — Progress Notes (Signed)
 Called to room to assist during endoscopic procedure.  Patient ID and intended procedure confirmed with present staff. Received instructions for my participation in the procedure from the performing physician.

## 2024-05-24 NOTE — Progress Notes (Signed)
 No F/U visits available on schedule- unable to schedule F/U office visit with Dr. Elwin

## 2024-05-24 NOTE — Progress Notes (Signed)
 See office note dated 04/26/2024 for details of current H&P  Patient presenting for enteroscopy for evaluation heme positive stool, history of erosive gastritis, history of erosive enteritis by capsule endoscopy 2022  He has been off of Xarelto  x 2 days  He is appropriate for [facility] endoscopy today.

## 2024-05-25 ENCOUNTER — Other Ambulatory Visit (HOSPITAL_COMMUNITY): Payer: Self-pay

## 2024-05-25 ENCOUNTER — Telehealth: Payer: Self-pay

## 2024-05-25 NOTE — Telephone Encounter (Signed)
 Pharmacy Patient Advocate Encounter   Received notification from CoverMyMeds that prior authorization for Xifaxan 550MG  tablets is required/requested.   Insurance verification completed.   The patient is insured through [facility].   Per test claim: PA required; PA submitted to above mentioned insurance via Latent Key/confirmation #/EOC [id number] Status is pending

## 2024-05-25 NOTE — Telephone Encounter (Signed)
  Follow up Call-     05/24/2024    9:23 AM  Call back number  Post procedure Call Back phone  # [phone]  Permission to leave phone message Yes     Patient questions:  Do you have a fever, pain , or abdominal swelling? No. Pain Score  0 *  Have you tolerated food without any problems? Yes.    Have you been able to return to your normal activities? Yes.    Do you have any questions about your discharge instructions: Diet   No. Medications  No. Follow up visit  No.  Do you have questions or concerns about your Care? Yes.   Pt said medication Dr. Albertus prescribed was too expensive for him. He plans to reach out to the office.   Actions: * If pain score is 4 or above: No action needed, pain <4.

## 2024-05-25 NOTE — Telephone Encounter (Signed)
 PA Team-  Can you guys see if xifaxan is requiring a prior authorization? I wonder if that is why it is too expensive...  Thank you for your help!

## 2024-05-26 ENCOUNTER — Other Ambulatory Visit (HOSPITAL_COMMUNITY): Payer: Self-pay

## 2024-05-26 LAB — SURGICAL PATHOLOGY

## 2024-05-26 NOTE — Telephone Encounter (Signed)
 Pharmacy Patient Advocate Encounter  Received notification from [facility] Medicare that Prior Authorization for Xifaxan 550MG  tablets has been APPROVED from 05-25-2024 to 06-08-2024. Ran test claim, Copay is $0.00. This test claim was processed through [facility]- copay amounts may vary at other pharmacies due to pharmacy/plan contracts, or as the patient moves through the different stages of their insurance plan.   PA #/Case ID/Reference #: [id number]

## 2024-05-27 ENCOUNTER — Ambulatory Visit: Payer: Self-pay | Admitting: Internal Medicine

## 2024-05-31 ENCOUNTER — Encounter: Payer: Self-pay | Admitting: Internal Medicine

## 2024-05-31 NOTE — Telephone Encounter (Signed)
 Sonny, would rifaximin samples be available to help this patient?? 550 mg 3 times daily x 14 days

## 2024-06-09 ENCOUNTER — Other Ambulatory Visit (HOSPITAL_COMMUNITY): Payer: Self-pay

## 2024-06-28 ENCOUNTER — Telehealth: Payer: Self-pay

## 2024-06-28 ENCOUNTER — Telehealth: Payer: Self-pay | Admitting: *Deleted

## 2024-06-28 NOTE — Telephone Encounter (Signed)
 Pharmacy Patient Advocate Encounter   Received notification from Patient Pharmacy that prior authorization for Xifaxan  550MG  tablet is required/requested.   Insurance verification completed.   The patient is insured through [facility].   Per test claim: xxx

## 2024-06-28 NOTE — Telephone Encounter (Signed)
 Patient contacted and advised Xifaxin ready for him to pick up at 3rd floor reception area.  Patient appreciative and will come by to get the samples.

## 2024-06-28 NOTE — Telephone Encounter (Signed)
 Patient did receive samples. No authorization needed for pharmacy now since this was a limited rx for 14 day supply.  Thank you for checking!

## 2024-08-03 ENCOUNTER — Ambulatory Visit: Payer: Medicare Other | Admitting: Neurology

## 2024-08-03 ENCOUNTER — Encounter: Payer: Self-pay | Admitting: Neurology

## 2024-08-03 VITALS — BP 104/62 | HR 88 | Ht 69.0 in | Wt 216.0 lb

## 2024-08-03 DIAGNOSIS — G629 Polyneuropathy, unspecified: Secondary | ICD-10-CM

## 2024-08-03 DIAGNOSIS — R2681 Unsteadiness on feet: Secondary | ICD-10-CM

## 2024-08-03 MED ORDER — PREGABALIN 75 MG PO CAPS: 75.0000 mg | ORAL_CAPSULE | Freq: Two times a day (BID) | ORAL | 5 refills | Status: DC

## 2024-08-03 NOTE — Progress Notes (Signed)
 "   Follow-up Visit   Date: 08/03/2024    Tony Cruz MRN: [id number] DOB: [birth year]    Tony Cruz is a 89 y.o. right-handed Caucasian male with GERD, BPH, OSA, HTN, factor V Leiden, history of SHD, and syncope due to sinus node dysfunction s/p PPM returning to the clinic for follow-up of neuropathy.  The patient was accompanied to the clinic by self.  IMPRESSION/PLAN: Peripheral neuropathy with numbness involving a stocking-glove distribution and sensory ataxia/imbalance.  He is having more numbness in the hands.  I discussed that symptoms may be due to progression of neuropathy vs entrapment neuropathy.  He does not have any ABP weakness and Tinel's sign is negative at the wrist.   - NCS/EMG of bilateral upper extremities to characterize the nature of his symptoms  - For pain, increase Lyrica  to 75mg  twice daily  - For gait instability, refer to PT for balance training  Further recommendations pending results.   --------------------------------------------- History of present illness: Starting around 20 years ago, he began having numbness in the feet.  He saw neurologist who diagnosed him with idiopathic neuropathy.  Over the years, his neuropathy has gradually extended up his feet and into his lower legs.  Over the past year, he has developed numbness in the fingertips.  He has noticed that he is dropping things more frequently.  He takes gabapentin  300mg  twice daily but has not noticed that it helps.  He plays golf several times per week and notices some imbalance. No falls and he walks unassisted.  He has a cane as needed.  No history of diabetes, alcohol , or exposure to chemotherapy.    UPDATE 08/04/2023:  He is here for follow-up.  He has noticed that numbness in the hands has progressed over the past year. Because of numbness, he tends to drop items.  He takes gabapentin  300mg  twice daily.  He continues to have numbness in the lower legs and numbness.  His  balance is fair.  He walks unassisted and has not had any falls.  He plays golf twice per week and has noticed that he has some imbalance when swinging the golf club. He denies any significant pain of the hands and legs.   UPDATE 08/03/2024:  Discussed the use of AI scribe software for clinical note transcription with the patient, who gave verbal consent to proceed.  History of Present Illness Tony Cruz is an 89 year old male with peripheral neuropathy who presents with worsening neuropathy symptoms.  He has experienced worsening symptoms of peripheral neuropathy for over twenty years, with significant difficulty in hand coordination. He often drops items due to lack of sensation in his fingertips, affecting both hands equally. He has adapted by using his thumb to help hold objects but still struggles with tasks such as opening bottles.  He experiences burning sensations in his feet, particularly noticeable upon waking and placing his feet on the floor. This burning is accompanied by a loss of balance, which he attributes to the neuropathy affecting his feet. He uses a cane for additional support when walking, which he finds helpful.  He was previously on gabapentin , which was increased to 300 mg twice daily but caused excessive drowsiness, leading to its discontinuation. He is currently on Lyrica  50 mg, which replaced gabapentin . He also takes Tylenol  for pain management.   He reports burning and tingling in his hands and feet. His legs are impacting his balance and mobility.   Medications:  Current Outpatient Medications  on File Prior to Visit  Medication Sig Dispense Refill   acetaminophen  (TYLENOL ) 500 MG tablet Take 500-1,000 mg by mouth every 6 (six) hours as needed for headache (or pain).      ascorbic acid (VITAMIN C) 500 MG tablet Take 1 tablet by mouth daily.     calcium carbonate (OS-CAL) 600 MG TABS Take 600 mg by mouth daily.     chlorthalidone  (HYGROTON ) 25 MG tablet  Take 1 tablet (25 mg total) by mouth daily. 90 tablet 3   Cholecalciferol 50 MCG (2000 UT) CAPS 1 capsule.     finasteride (PROSCAR) 5 MG tablet Take 1 tablet by mouth daily.     FLUZONE HIGH-DOSE QUADRIVALENT 0.7 ML SUSY      Loratadine 10 MG CAPS 1 capsule.     losartan  (COZAAR ) 100 MG tablet TAKE 1 TABLET BY MOUTH DAILY 90 tablet 2   LYRICA  50 MG capsule 1 capsule. (Patient taking differently: Take 50 mg by mouth at bedtime.)     Multiple Vitamin (MULTIVITAMIN) capsule Take 1 capsule by mouth daily.     pantoprazole  (PROTONIX ) 40 MG tablet TAKE ONE TABLET DAILY. MAKEAN APPOINTMENT FOR FURTHER REFILLS 90 tablet 3   rifaximin  (XIFAXAN ) 550 MG TABS tablet 1 tablet TID for 14 days 42 tablet 0   rosuvastatin (CRESTOR) 5 MG tablet Take 5 mg by mouth daily.     tamsulosin (FLOMAX) 0.4 MG CAPS capsule Take 0.4 mg by mouth daily.     traMADol  (ULTRAM ) 50 MG tablet Take 50 mg by mouth 3 (three) times daily as needed.     XARELTO  15 MG TABS tablet TAKE 1 TABLET DAILY WITH   SUPPER 90 tablet 0   No current facility-administered medications on file prior to visit.    Allergies:  Allergies  Allergen Reactions   Shellfish Allergy Rash and Other (See Comments)    Sensitive to shellfish    Vital Signs:  BP 104/62   Pulse 88   Ht 5' 9 (1.753 m)   Wt 216 lb (98 kg)   SpO2 96%   BMI 31.90 kg/m    Neurological Exam: MENTAL STATUS including orientation to time, place, person, recent and remote memory, attention span and concentration, language, and fund of knowledge is normal.  Speech is not dysarthric.  CRANIAL NERVES:  Pupils equal round and reactive to light.  Normal conjugate, extra-ocular eye movements in all directions of gaze.  No ptosis.  Face is symmetric.   MOTOR:  Motor strength is 5/5 in all extremities, including distally.  No atrophy, fasciculations or abnormal movements.  No pronator drift.  Tone is normal.    MSRs:  Reflexes are 2+/4 in the arms, absent in the legs.  Tinel's  sign is negative at the wrists.   SENSORY:  Vibration intact at the MCP, absent below the knees.  Temperature reduce in the hands and below the knees and worse distally.  COORDINATION/GAIT:  Normal finger-to- nose-finger.  Intact rapid alternating movements bilaterally.  Gait is mildly wide based, unassisted.   Data: n/a    Thank you for allowing me to participate in patient's care.  If I can answer any additional questions, I would be pleased to do so.    Sincerely,    Gunda Maqueda K. Tobie, DO   "

## 2024-08-03 NOTE — Patient Instructions (Signed)
 Start pregabalin  75mg  twice daily  Nerve testing of the upper extremities  ELECTROMYOGRAM AND NERVE CONDUCTION STUDIES (EMG/NCS) INSTRUCTIONS  How to Prepare The neurologist conducting the EMG will need to know if you have certain medical conditions. Tell the neurologist and other EMG lab personnel if you: Have a pacemaker or any other electrical medical device Take blood-thinning medications Have hemophilia, a blood-clotting disorder that causes prolonged bleeding Bathing Take a shower or bath shortly before your exam in order to remove oils from your skin. Dont apply lotions or creams before the exam.  What to Expect Youll likely be asked to change into a hospital gown for the procedure and lie down on an examination table. The following explanations can help you understand what will happen during the exam.  Electrodes. The neurologist or a technician places surface electrodes at various locations on your skin depending on where youre experiencing symptoms. Or the neurologist may insert needle electrodes at different sites depending on your symptoms.  Sensations. The electrodes will at times transmit a tiny electrical current that you may feel as a twinge or spasm. The needle electrode may cause discomfort or pain that usually ends shortly after the needle is removed. If you are concerned about discomfort or pain, you may want to talk to the neurologist about taking a short break during the exam.  Instructions. During the needle EMG, the neurologist will assess whether there is any spontaneous electrical activity when the muscle is at rest - activity that isnt present in healthy muscle tissue - and the degree of activity when you slightly contract the muscle.  He or she will give you instructions on resting and contracting a muscle at appropriate times. Depending on what muscles and nerves the neurologist is examining, he or she may ask you to change positions during the exam.  After your  EMG You may experience some temporary, minor bruising where the needle electrode was inserted into your muscle. This bruising should fade within several days. If it persists, contact your primary care doctor.

## 2024-08-09 ENCOUNTER — Encounter: Payer: Self-pay | Admitting: Neurology

## 2024-08-09 MED ORDER — PREGABALIN 75 MG PO CAPS: 75.0000 mg | ORAL_CAPSULE | Freq: Two times a day (BID) | ORAL | 1 refills | Status: AC

## 2024-08-17 ENCOUNTER — Ambulatory Visit

## 2024-08-17 DIAGNOSIS — I495 Sick sinus syndrome: Secondary | ICD-10-CM | POA: Diagnosis not present

## 2024-08-18 ENCOUNTER — Ambulatory Visit: Admitting: Podiatry

## 2024-08-18 DIAGNOSIS — M79675 Pain in left toe(s): Secondary | ICD-10-CM | POA: Diagnosis not present

## 2024-08-18 DIAGNOSIS — M79674 Pain in right toe(s): Secondary | ICD-10-CM

## 2024-08-18 DIAGNOSIS — B351 Tinea unguium: Secondary | ICD-10-CM | POA: Diagnosis not present

## 2024-08-18 LAB — CUP PACEART REMOTE DEVICE CHECK
Battery Voltage: 45
Date Time Interrogation Session: 20260120093209
Implantable Lead Connection Status: 753985
Implantable Lead Connection Status: 753985
Implantable Lead Implant Date: 19990330
Implantable Lead Implant Date: 19990330
Implantable Lead Location: 753859
Implantable Lead Location: 753860
Implantable Lead Model: 5068
Implantable Lead Model: 5092
Implantable Pulse Generator Implant Date: 20181024
Pulse Gen Model: 407145
Pulse Gen Serial Number: 69203177

## 2024-08-18 NOTE — Progress Notes (Signed)
 1  Subjective:  Patient ID: Tony Cruz, male    DOB: [birth year],  MRN: [id number]  89 y.o. male presents with at risk foot care with history of coagulation defect and peripheral neuropathy. He is seen for painful elongated mycotic toenails 1-5 bilaterally which are tender when wearing enclosed shoe gear. Pain is relieved with periodic professional debridement. Chief Complaint  Patient presents with   RFC    RFC nail trim. L great toe rubs on L 2nd toe.  Not diabetic. Xarelto .     PCP: Larnell Hamilton, MD.  New problem(s): None.   Review of Systems: Negative except as noted in the HPI.   Allergies  Allergen Reactions   Shellfish Allergy Rash and Other (See Comments)    Sensitive to shellfish    Objective:  There were no vitals filed for this visit. Constitutional Patient is a pleasant 89 y.o. male in NAD. AAO x 3.  Vascular Capillary fill time to digits <3 seconds.  DP/PT pulse(s) are faintly palpable b/l lower extremities. Pedal hair absent b/l. Lower extremity skin temperature gradient warm to cool b/l. No pain with calf compression b/l. No cyanosis or clubbing noted. No ischemia nor gangrene noted b/l.   Neurologic Protective sensation diminished with 10g monofilament b/l.  Dermatologic Pedal skin is thin, shiny and atrophic b/l.  No open wounds b/l lower extremities. No interdigital macerations b/l lower extremities. Toenails 1-5 b/l elongated, discolored, dystrophic, thickened, crumbly with subungual debris and tenderness to dorsal palpation. No hyperkeratotic nor porokeratotic lesions present on today's visit.  Orthopedic: Normal muscle strength 5/5 to all lower extremity muscle groups bilaterally. HAV with bunion deformity noted b/l LE. Hammertoe deformity noted 2-5 b/l.   Last HgA1c:      No data to display         Assessment:   1. Pain due to onychomycosis of toenails of both feet     Plan:  Patient was evaluated and treated and all questions  answered. -Consent given for treatment as described below: -Examined patient. -Continue foot and shoe inspections daily. Monitor blood glucose per PCP/Endocrinologist's recommendations. -Toenails 1-5 b/l were debrided in length and girth with sterile nail nippers and dremel without iatrogenic bleeding.  -Patient/POA to call should there be question/concern in the interim.  No follow-ups on file.  Franky SHAUNNA Blanch, DPM      [place] LOCATION: [address], [place] [address]                   Office [phone]   [facility] LOCATION: [address] [place], [place] [address] Office [phone]

## 2024-08-19 ENCOUNTER — Ambulatory Visit: Admitting: Internal Medicine

## 2024-08-19 ENCOUNTER — Encounter: Payer: Self-pay | Admitting: Internal Medicine

## 2024-08-19 VITALS — BP 116/68 | HR 80 | Ht 67.0 in | Wt 219.2 lb

## 2024-08-19 DIAGNOSIS — K449 Diaphragmatic hernia without obstruction or gangrene: Secondary | ICD-10-CM

## 2024-08-19 DIAGNOSIS — Z8639 Personal history of other endocrine, nutritional and metabolic disease: Secondary | ICD-10-CM

## 2024-08-19 DIAGNOSIS — K219 Gastro-esophageal reflux disease without esophagitis: Secondary | ICD-10-CM | POA: Diagnosis not present

## 2024-08-19 DIAGNOSIS — K638219 Small intestinal bacterial overgrowth, unspecified: Secondary | ICD-10-CM

## 2024-08-19 DIAGNOSIS — Z800 Family history of malignant neoplasm of digestive organs: Secondary | ICD-10-CM

## 2024-08-19 NOTE — Patient Instructions (Signed)
 Continue pantoprazole .   Please follow up with Dr. Albertus in September.   _______________________________________________________  If your blood pressure at your visit was 140/90 or greater, please contact your primary care physician to follow up on this.  _______________________________________________________  If you are age 89 or older, your body mass index should be between 23-30. Your Body mass index is 34.34 kg/m. If this is out of the aforementioned range listed, please consider follow up with your Primary Care Provider.  If you are age 75 or younger, your body mass index should be between 19-25. Your Body mass index is 34.34 kg/m. If this is out of the aformentioned range listed, please consider follow up with your Primary Care Provider.   ________________________________________________________  The [facility] providers would like to encourage you to use MYCHART to communicate with providers for non-urgent requests or questions.  Due to long hold times on the telephone, sending your provider a message by [facility] may be a faster and more efficient way to get a response.  Please allow 48 business hours for a response.  Please remember that this is for non-urgent requests.  _______________________________________________________  [facility] is using a team-based approach to care.  Your team is made up of your doctor and two to three APPS. Our APPS (Nurse Practitioners and Physician Assistants) work with your physician to ensure care continuity for you. They are fully qualified to address your health concerns and develop a treatment plan. They communicate directly with your gastroenterologist to care for you. Seeing the Advanced Practice Practitioners on your physician's team can help you by facilitating care more promptly, often allowing for earlier appointments, access to diagnostic testing, procedures, and other specialty referrals.

## 2024-08-19 NOTE — Progress Notes (Signed)
 "  Subjective:    Patient ID: Tony Cruz, male    DOB: 04/01/1934, 89 y.o.   MRN: [id number]  HPI Tony Cruz is an 89 year old male with small intestinal bacterial overgrowth, GERD with hiatal hernia, and prior gastrointestinal bleeding/IDA who presents for follow-up after small bowel enteroscopy.  Intermittent heme positive stool prompted EGD, colonoscopy, and video capsule endoscopy in early 2022, which revealed erosions and ulcerations in the distal duodenum and proximal jejunum. Small bowel enteroscopy on 05/24/24 showed a normal esophagus, two centimeter hydro hernia, multiple sessile polyps in the stomach (biopsied as fundic gland polyps), and normal examined duodenum and jejunum. Jejunal biopsies in October 2025 showed normal small bowel with intact villous architecture and no inflammation. Iron studies in late September 2025, as well as B12 and folate in October 2025, were within normal limits.  He was experienced smaller, looser, and more frequent stools with associated gas and flatulence.  At times stools explosive.  After completing a course of rifaximin  550 mg BID, gas symptoms resolved. He used Librarian, Academic probiotic and daily Metamucil as recommended, and currently continues the probiotic.  He ran out of the Metamucil.  Bowel movements now occur three times daily and are soft, compared to previously having a single hard bowel movement that was difficult to pass. Occasionally, he massages the perianal area to facilitate defecation and sometimes observes a spot of blood on the tissue, which he attributes to a fissure, but does not see blood in the stool.  Abdominal bloating symptom has resolved entirely.  Pantoprazole  40 mg daily is taken for reflux, and heartburn has not recurred since starting the medication. He is aware of his hiatal hernia and has not had any reflux symptoms.  No dysphagia or dyne aphasia  He expresses concern about colon cancer due to his sister's recent  diagnosis. Last colonoscopy was four years ago and did not show any polyps. He remains active and is able to play golf.   Review of Systems As per HPI, otherwise negative  Current Medications, Allergies, Past Medical History, Past Surgical History, Family History and Social History were reviewed in [employer] record.    Objective:   Physical Exam BP 116/68   Pulse 80   Ht 5' 7 (1.702 m)   Wt 219 lb 4 oz (99.5 kg)   BMI 34.34 kg/m  Gen: awake, alert, NAD HEENT: anicteric  Abd: soft, NT/ND, +BS throughout Ext: no c/c/e Neuro: nonfocal  Labs Vitamin B12 (04/29/2024): Within normal limits Folate (04/29/2024): Within normal limits Ferritin (04/26/2024): 24 Iron saturation (04/26/2024): 46% TIBC (04/26/2024): Within normal limits Iron (04/26/2024): Within normal limits Hemoglobin (04/26/2024): 12.9 MCV (04/26/2024): 99.9 AST (04/26/2024): Within normal limits ALT (04/26/2024): Within normal limits Total bilirubin (04/26/2024): 0.4 AlkPhos (04/26/2024): 44 Creatinine (04/26/2024): 0.91  Diagnostic Video capsule endoscopy (Early 2022): Erosions and ulcerations in distal duodenum and proximal jejunum Colonoscopy (2022): No polyps, complete examination with adequate bowel preparation, entire colon visualized  Pathology Jejunal biopsy (05/24/2024): Normal small bowel with intact villous architecture, no intraepithelial lymphocytes, negative for features of chronic or acute inflammation Gastric polyp pathology (05/24/2024): Fundic gland polyps      Assessment & Plan:   Small intestinal bacterial overgrowth (SIBO)/gas flatulence with occasional explosive stool Symptoms resolved post-rifaximin  with normalized bowel habits. Recurrence possible, rifaximin  safe for future use. - Completed rifaximin  550 mg BID. - Discussed rifaximin  safety for future recurrence. - Advised continuation of probiotic (Align) if beneficial. - Recommended discontinuation of  fiber supplement if no benefit. - No further labs needed; recent labs normal. - Reassess in September if symptoms recur or persist.  Gastroesophageal reflux disease with hiatal hernia Condition well controlled on current therapy. - Continue pantoprazole  40 mg daily.  Family history of colon cancer in patient's sister Patient is concerned about discontinuation of colonoscopy for the purposes of screening and surveillance.  He does have a history of remote colonic polyps but his last colonoscopy in December 2021 was normal.  He does have diverticulosis.  If he continues to be well and healthy as he is now it would not be unreasonable to repeat colonoscopy in December.  I would like to meet with him in September of this year to discuss -Follow-up in September, discussion regarding risk versus benefits of surveillance colonoscopy in December 2026  IDA  Resolved.  30 minutes total spent today including patient facing time, coordination of care, reviewing medical history/procedures/pertinent radiology studies, and documentation of the encounter.  "

## 2024-08-20 NOTE — Progress Notes (Signed)
 Remote PPM Transmission

## 2024-08-29 ENCOUNTER — Ambulatory Visit: Payer: Self-pay | Admitting: Student in an Organized Health Care Education/Training Program

## 2024-09-21 ENCOUNTER — Ambulatory Visit: Admitting: Student in an Organized Health Care Education/Training Program

## 2024-10-07 ENCOUNTER — Encounter: Payer: Self-pay | Admitting: Neurology

## 2024-11-16 ENCOUNTER — Ambulatory Visit

## 2024-11-17 ENCOUNTER — Ambulatory Visit: Admitting: Podiatry

## 2025-01-11 ENCOUNTER — Telehealth: Admitting: Family Medicine

## 2025-02-15 ENCOUNTER — Ambulatory Visit

## 2025-05-17 ENCOUNTER — Ambulatory Visit

## 2025-08-08 ENCOUNTER — Ambulatory Visit: Payer: Self-pay | Admitting: Neurology

## 2025-08-16 ENCOUNTER — Ambulatory Visit

## 2025-11-15 ENCOUNTER — Ambulatory Visit
